# Patient Record
Sex: Female | Born: 1965 | Race: Black or African American | Hispanic: No | Marital: Married | Smoking: Current every day smoker
Health system: Southern US, Community
[De-identification: ages and names within clinical notes are randomized; demographics above are authoritative.]

## PROBLEM LIST (undated history)

## (undated) DIAGNOSIS — D649 Anemia, unspecified: Secondary | ICD-10-CM

## (undated) DIAGNOSIS — I10 Essential (primary) hypertension: Secondary | ICD-10-CM

## (undated) DIAGNOSIS — T7840XA Allergy, unspecified, initial encounter: Secondary | ICD-10-CM

## (undated) DIAGNOSIS — R7303 Prediabetes: Secondary | ICD-10-CM

## (undated) DIAGNOSIS — R011 Cardiac murmur, unspecified: Secondary | ICD-10-CM

## (undated) DIAGNOSIS — F319 Bipolar disorder, unspecified: Secondary | ICD-10-CM

## (undated) DIAGNOSIS — K219 Gastro-esophageal reflux disease without esophagitis: Secondary | ICD-10-CM

## (undated) HISTORY — PX: TUBAL LIGATION: SHX77

## (undated) HISTORY — PX: HERNIA REPAIR: SHX51

## (undated) HISTORY — DX: Essential (primary) hypertension: I10

## (undated) HISTORY — DX: Bipolar disorder, unspecified: F31.9

## (undated) HISTORY — DX: Gastro-esophageal reflux disease without esophagitis: K21.9

## (undated) HISTORY — DX: Prediabetes: R73.03

## (undated) HISTORY — DX: Allergy, unspecified, initial encounter: T78.40XA

---
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
## 1988-12-23 HISTORY — PX: TUBAL LIGATION: SHX77

## 1998-02-20 ENCOUNTER — Inpatient Hospital Stay (HOSPITAL_COMMUNITY): Admission: EM | Admit: 1998-02-20 | Discharge: 1998-02-22 | Payer: Self-pay | Admitting: Obstetrics & Gynecology

## 1998-03-16 ENCOUNTER — Other Ambulatory Visit: Admission: RE | Admit: 1998-03-16 | Discharge: 1998-03-16 | Payer: Self-pay | Admitting: Obstetrics

## 1998-04-05 ENCOUNTER — Ambulatory Visit (HOSPITAL_COMMUNITY): Admission: RE | Admit: 1998-04-05 | Discharge: 1998-04-05 | Payer: Self-pay | Admitting: Dermatology

## 1998-04-17 ENCOUNTER — Ambulatory Visit (HOSPITAL_COMMUNITY): Admission: RE | Admit: 1998-04-17 | Discharge: 1998-04-17 | Payer: Self-pay | Admitting: Dermatology

## 1998-06-10 ENCOUNTER — Emergency Department (HOSPITAL_COMMUNITY): Admission: EM | Admit: 1998-06-10 | Discharge: 1998-06-10 | Payer: Self-pay | Admitting: Emergency Medicine

## 2000-02-15 ENCOUNTER — Encounter: Admission: RE | Admit: 2000-02-15 | Discharge: 2000-02-15 | Payer: Self-pay | Admitting: Internal Medicine

## 2001-01-15 ENCOUNTER — Other Ambulatory Visit: Admission: RE | Admit: 2001-01-15 | Discharge: 2001-01-15 | Payer: Self-pay | Admitting: Obstetrics

## 2003-10-11 ENCOUNTER — Encounter: Payer: Self-pay | Admitting: Emergency Medicine

## 2003-10-11 ENCOUNTER — Emergency Department (HOSPITAL_COMMUNITY): Admission: EM | Admit: 2003-10-11 | Discharge: 2003-10-11 | Payer: Self-pay | Admitting: Emergency Medicine

## 2003-10-14 ENCOUNTER — Emergency Department (HOSPITAL_COMMUNITY): Admission: EM | Admit: 2003-10-14 | Discharge: 2003-10-14 | Payer: Self-pay | Admitting: Emergency Medicine

## 2003-10-17 ENCOUNTER — Encounter: Payer: Self-pay | Admitting: Emergency Medicine

## 2003-10-17 ENCOUNTER — Emergency Department (HOSPITAL_COMMUNITY): Admission: EM | Admit: 2003-10-17 | Discharge: 2003-10-17 | Payer: Self-pay | Admitting: Emergency Medicine

## 2003-10-19 ENCOUNTER — Emergency Department (HOSPITAL_COMMUNITY): Admission: EM | Admit: 2003-10-19 | Discharge: 2003-10-19 | Payer: Self-pay | Admitting: Emergency Medicine

## 2004-06-30 ENCOUNTER — Emergency Department (HOSPITAL_COMMUNITY): Admission: EM | Admit: 2004-06-30 | Discharge: 2004-06-30 | Payer: Self-pay | Admitting: Emergency Medicine

## 2004-07-02 ENCOUNTER — Emergency Department (HOSPITAL_COMMUNITY): Admission: EM | Admit: 2004-07-02 | Discharge: 2004-07-02 | Payer: Self-pay | Admitting: Emergency Medicine

## 2004-09-26 ENCOUNTER — Emergency Department (HOSPITAL_COMMUNITY): Admission: EM | Admit: 2004-09-26 | Discharge: 2004-09-26 | Payer: Self-pay | Admitting: Emergency Medicine

## 2004-10-21 ENCOUNTER — Emergency Department (HOSPITAL_COMMUNITY): Admission: EM | Admit: 2004-10-21 | Discharge: 2004-10-21 | Payer: Self-pay | Admitting: Emergency Medicine

## 2004-10-31 ENCOUNTER — Emergency Department (HOSPITAL_COMMUNITY): Admission: EM | Admit: 2004-10-31 | Discharge: 2004-10-31 | Payer: Self-pay | Admitting: Emergency Medicine

## 2005-02-01 ENCOUNTER — Emergency Department (HOSPITAL_COMMUNITY): Admission: EM | Admit: 2005-02-01 | Discharge: 2005-02-01 | Payer: Self-pay | Admitting: Emergency Medicine

## 2005-03-23 ENCOUNTER — Emergency Department (HOSPITAL_COMMUNITY): Admission: EM | Admit: 2005-03-23 | Discharge: 2005-03-23 | Payer: Self-pay | Admitting: Emergency Medicine

## 2005-05-09 ENCOUNTER — Emergency Department (HOSPITAL_COMMUNITY): Admission: EM | Admit: 2005-05-09 | Discharge: 2005-05-09 | Payer: Self-pay | Admitting: Emergency Medicine

## 2005-06-23 ENCOUNTER — Emergency Department (HOSPITAL_COMMUNITY): Admission: EM | Admit: 2005-06-23 | Discharge: 2005-06-23 | Payer: Self-pay | Admitting: Emergency Medicine

## 2005-08-21 ENCOUNTER — Emergency Department (HOSPITAL_COMMUNITY): Admission: EM | Admit: 2005-08-21 | Discharge: 2005-08-21 | Payer: Self-pay | Admitting: Emergency Medicine

## 2005-11-02 ENCOUNTER — Emergency Department (HOSPITAL_COMMUNITY): Admission: EM | Admit: 2005-11-02 | Discharge: 2005-11-02 | Payer: Self-pay | Admitting: Emergency Medicine

## 2006-11-21 ENCOUNTER — Inpatient Hospital Stay (HOSPITAL_COMMUNITY): Admission: EM | Admit: 2006-11-21 | Discharge: 2006-11-23 | Payer: Self-pay | Admitting: Emergency Medicine

## 2007-07-02 ENCOUNTER — Ambulatory Visit: Payer: Self-pay | Admitting: Internal Medicine

## 2007-08-17 ENCOUNTER — Encounter: Payer: Self-pay | Admitting: Nurse Practitioner

## 2007-08-17 DIAGNOSIS — M79609 Pain in unspecified limb: Secondary | ICD-10-CM | POA: Insufficient documentation

## 2007-08-17 DIAGNOSIS — F329 Major depressive disorder, single episode, unspecified: Secondary | ICD-10-CM

## 2007-08-19 ENCOUNTER — Encounter (INDEPENDENT_AMBULATORY_CARE_PROVIDER_SITE_OTHER): Payer: Self-pay | Admitting: Nurse Practitioner

## 2007-08-19 ENCOUNTER — Ambulatory Visit: Payer: Self-pay | Admitting: Family Medicine

## 2007-08-19 ENCOUNTER — Other Ambulatory Visit: Admission: RE | Admit: 2007-08-19 | Discharge: 2007-08-19 | Payer: Self-pay | Admitting: Internal Medicine

## 2007-08-19 LAB — CONVERTED CEMR LAB
Chlamydia, DNA Probe: NEGATIVE
GC Probe Amp, Genital: NEGATIVE

## 2007-08-26 ENCOUNTER — Ambulatory Visit (HOSPITAL_COMMUNITY): Admission: RE | Admit: 2007-08-26 | Discharge: 2007-08-26 | Payer: Self-pay | Admitting: Internal Medicine

## 2009-08-23 ENCOUNTER — Encounter (INDEPENDENT_AMBULATORY_CARE_PROVIDER_SITE_OTHER): Payer: Self-pay | Admitting: Nurse Practitioner

## 2011-05-06 ENCOUNTER — Emergency Department (HOSPITAL_COMMUNITY): Payer: No Typology Code available for payment source

## 2011-05-06 ENCOUNTER — Emergency Department (HOSPITAL_COMMUNITY)
Admission: EM | Admit: 2011-05-06 | Discharge: 2011-05-06 | Disposition: A | Discharge status: Home / Self Care | Payer: No Typology Code available for payment source | Attending: Emergency Medicine | Admitting: Emergency Medicine

## 2011-05-06 DIAGNOSIS — M25569 Pain in unspecified knee: Secondary | ICD-10-CM | POA: Insufficient documentation

## 2011-05-06 DIAGNOSIS — T1490XA Injury, unspecified, initial encounter: Secondary | ICD-10-CM | POA: Insufficient documentation

## 2011-05-06 DIAGNOSIS — M542 Cervicalgia: Secondary | ICD-10-CM | POA: Insufficient documentation

## 2011-05-06 DIAGNOSIS — M545 Low back pain, unspecified: Secondary | ICD-10-CM | POA: Insufficient documentation

## 2011-05-06 DIAGNOSIS — Y92410 Unspecified street and highway as the place of occurrence of the external cause: Secondary | ICD-10-CM | POA: Insufficient documentation

## 2011-05-10 NOTE — Consult Note (Signed)
Cheyenne Fowler, Cheyenne Fowler               ACCOUNT NO.:  [id number]   MEDICAL RECORD NO.:  [id number]          PATIENT TYPE:  EMS   LOCATION:  ED                           FACILITY:  [facility]   PHYSICIAN:  Ardeth Sportsman, MD     DATE OF BIRTH:  October 04, 1966   DATE OF CONSULTATION:  11/21/2006  DATE OF DISCHARGE:                                 CONSULTATION   REASON FOR CONSULTATION:  Left buttock abscess.   HISTORY OF PRESENT ILLNESS:  Cheyenne Fowler is a 45 year old female  otherwise pretty healthy who has had about a three-day history of  increasing pain and swelling and tenderness in her left buttock.  She  denies any trauma. No history of any perirectal abscess. She has never  had anything like this before.  No history of skin infection or diabetes  in the past.  Pain is intensifying and worsening.  A concerned friend  brought her into the emergency room.   PAST MEDICAL HISTORY:  Otherwise negative.   PAST SURGICAL HISTORY:  She denies.   ALLERGIES:  PENICILLIN gives her a rash and SULFA gives her a rash.   MEDICATIONS:  None.   SOCIAL HISTORY:  She has about a 20-pack-year history of tobacco. Denies  any alcohol or other drug use.   FAMILY HISTORY:  Negative for any diabetes or cardiopulmonary disorders.   REVIEW OF SYSTEMS:  Comprehensive review of systems as noted and  constitutional, ophthalmologic, ENT, cardiac, respiratory, GI, GU, GYN  are otherwise negative. Musculoskeletal and neurological,  lymphadenopathy and hematology are otherwise negative.  No blood  thinners, no history of major bleeding or nosebleeds or menorrhagia.  Dermatology: No prior skin infections that she can recall.  She does  have some old chronic rash skin changes and she says that she bruises  easily.  Psychiatric history negative.   PHYSICAL EXAMINATION:  VITAL SIGNS:  Temperature 101.5, pulse 114, blood  pressure 114/73, respirations 16.  She has 10/10 pain.  GENERAL:  She is well-developed, overweight  female, obviously  uncomfortable but not toxic.  PSYCHIATRIC:  She was a little tired but she is at least average  intelligence and no evidence of dementia, psychosis or paranoia.  HEENT:  Pupils are equal, round and reactive to light.  Extraocular  movements intact.  Sclerae nonicteric or injected.  She is  normocephalic.  No facial asymmetry.  Mucous membranes somewhat dry but  nasopharynx and oropharynx clear.  NECK:  Supple without any masses.  Trachea midline.  I feel no obvious  thyroid masses.  HEART:  Regular rate and rhythm.  No murmurs, clicks or rubs.  No  carotid bruits.  She has normal radial and dorsalis pedis pulses.  CHEST:  No pain over the sternum on compression.  Lungs clear to  auscultation bilaterally.  No wheezes, rales or rhonchi.  ABDOMEN:  Soft, nontender, nondistended.  GENITOURINARY:  Normal female genitalia.  RECTAL:  She has normal sphincter tone.  No anorectal masses.  No  obvious anorectal fistulae.  LYMPHADENOPATHY:  No head and neck, axillary, or groin lymphadenopathy.  MUSCULOSKELETAL:  Full  range of motion shoulders, elbows, wrists, hips,  knees, and ankles except for on the left.  The hip is somewhat decreased  secondary to pain.  SKIN:  On the left buttock she has a 15 x 20 cm area of induration, pain  and swelling.  This is very exquisitely tender with some deep  fluctuance.  It seemed to be far away from the anus in itself and it was  actually kind of posterior, almost posterolateral. CBC is pending.   ASSESSMENT/PLAN:  A 45 year old female with a large gluteal abscess.   1. Options were discussed but given the large size of this and the      exquisite tenderness, I think this would be better dealt with in      the operating room where she could have monitored deep sedation or      even general anesthesia and given a proper incision and drainage of      the area.  2. Intravenous antibiotics.  Will start with some IV cephazolin      although I  worry she is at high risk for MRSA.  Will follow the      patient on culture and probably place her on some vancomycin as      well.  3. Hospital admission and IV antibiotics until induration has gone      down and transition over appropriate oral antibiotic.   Options were discussed and technique of surgery was explained, questions  answered and she and her friend agree to proceed.      Ardeth Sportsman, MD  Electronically Signed     SCG/MEDQ  D:  11/21/2006  T:  11/21/2006  Job:  [id number]

## 2011-05-10 NOTE — Op Note (Signed)
[name], Cheyenne Fowler               ACCOUNT NO.:  [id number]   MEDICAL RECORD NO.:  [id number]          PATIENT TYPE:  EMS   LOCATION:  ED                           FACILITY:  [facility]   PHYSICIAN:  Ardeth Sportsman, MD     DATE OF BIRTH:  Sep 14, 1966   DATE OF PROCEDURE:  DATE OF DISCHARGE:                               OPERATIVE REPORT   REQUESTING PHYSICIAN:  Dr. Babs Bertin.   PREOPERATIVE DIAGNOSIS:  Left buttock abscess.   POSTOPERATIVE DIAGNOSIS:  Left buttock abscess.   PROCEDURE PERFORMED:  Incision and drainage of left buttock abscess with  cultures.   EXAMINATION UNDER ANESTHESIA:  1. General.  2. Bupivacaine 0.25% 30 ml using a field block around the area of      induration.   SPECIMENS:  Cultures for aerobic and anaerobic.   DRAINS:  None.   ESTIMATED BLOOD LOSS:  Less than 10 ml.   COMPLICATIONS:  None apparent.   INDICATIONS:  [name] is a 45 year old female with a 72-hour history  of pain and swelling of her buttock and was found to have a large area  of induration and pain around 15 x 20 cm, felt too large to be safely  drained in the emergency room.  Options were discussed, and informed  consent was given for incision and drainage under general anesthesia.  The risks of stroke, MI, DVT, pulmonary embolism, or death were  discussed.  Other risks such as need for transfusion, abscess,  recurrence, need for operation, and other risks were discussed.  Questions answered.  She agreed to proceed.   OPERATIVE FINDINGS:  She had frank pus in a large subcutaneous pocket  between the fat and her gluteus fascia.  The fascia appeared to be nice,  viable, and intact with no evidence of any fasciitis.   DESCRIPTION OF PROCEDURE:  Informed consent was confirmed.  She received  2 gm of cefazolin and tolerated this well, despite her history of  PENICILLIN allergy in the past.  She underwent general anesthesia  without difficulty.  She was positioned prone in a jack-knife  position.  Her glutei were prepped and draped in a sterile fashion.  A 2 cm  incision was made into the gluteus and bluntly entered a large cavity  with evacuation of pus.  Swabs were sent for culture.  The incision was  extended to about 5 cm in total vertical length.  Careful inspection was  able to free the fascia and break up any related pockets.  The fascia  had good, healthy bleeding tissue.  The area was suctioned out and  cleared out.  Hemostasis was maintained.  The wound was packed with some  Betadine-soaked gauze.   EXAMINATION UNDER ANESTHESIA:  Rectal exam revealed no rectal masses or  any fluctuans, arguing against this being a perirectal abscess and  purely a soft tissue gluteal abscess.   The patient was extubated and taken to the recovery room in stable  condition.      Ardeth Sportsman, MD  Electronically Signed     SCG/MEDQ  D:  11/21/2006  T:  11/21/2006  Job:  [id number]

## 2012-02-18 ENCOUNTER — Encounter (HOSPITAL_COMMUNITY): Payer: Self-pay | Admitting: *Deleted

## 2012-02-18 ENCOUNTER — Emergency Department (HOSPITAL_COMMUNITY)
Admission: EM | Admit: 2012-02-18 | Discharge: 2012-02-18 | Disposition: A | Discharge status: Home / Self Care | Payer: Self-pay | Attending: Emergency Medicine | Admitting: Emergency Medicine

## 2012-02-18 DIAGNOSIS — N63 Unspecified lump in unspecified breast: Secondary | ICD-10-CM | POA: Insufficient documentation

## 2012-02-18 DIAGNOSIS — F17200 Nicotine dependence, unspecified, uncomplicated: Secondary | ICD-10-CM | POA: Insufficient documentation

## 2012-02-18 DIAGNOSIS — N644 Mastodynia: Secondary | ICD-10-CM | POA: Insufficient documentation

## 2012-02-18 DIAGNOSIS — N390 Urinary tract infection, site not specified: Secondary | ICD-10-CM | POA: Insufficient documentation

## 2012-02-18 HISTORY — DX: Cardiac murmur, unspecified: R01.1

## 2012-02-18 LAB — URINALYSIS, ROUTINE W REFLEX MICROSCOPIC
Protein, ur: NEGATIVE mg/dL
Urobilinogen, UA: 0.2 mg/dL (ref 0.0–1.0)

## 2012-02-18 LAB — PREGNANCY, URINE: Preg Test, Ur: NEGATIVE

## 2012-02-18 MED ORDER — CIPROFLOXACIN HCL 500 MG PO TABS: 500.0000 mg | ORAL_TABLET | Freq: Two times a day (BID) | ORAL | Status: DC

## 2012-02-18 MED ORDER — IBUPROFEN 600 MG PO TABS: 600.0000 mg | ORAL_TABLET | Freq: Three times a day (TID) | ORAL | Status: AC

## 2012-02-18 MED ORDER — TRAMADOL HCL 50 MG PO TABS
50.0000 mg | ORAL_TABLET | Freq: Once | ORAL | Status: AC
  Administered 2012-02-18: 50 mg via ORAL
  Filled 2012-02-18: qty 1

## 2012-02-18 MED ORDER — IBUPROFEN 600 MG PO TABS: 600.0000 mg | ORAL_TABLET | Freq: Three times a day (TID) | ORAL | Status: DC

## 2012-02-18 MED ORDER — TRAMADOL HCL 50 MG PO TABS: 50.0000 mg | ORAL_TABLET | Freq: Three times a day (TID) | ORAL | Status: AC | PRN

## 2012-02-18 MED ORDER — TRAMADOL HCL 50 MG PO TABS: 50.0000 mg | ORAL_TABLET | Freq: Three times a day (TID) | ORAL | Status: DC | PRN

## 2012-02-18 MED ORDER — CIPROFLOXACIN HCL 500 MG PO TABS: 500.0000 mg | ORAL_TABLET | Freq: Two times a day (BID) | ORAL | Status: AC

## 2012-02-18 NOTE — Discharge Instructions (Signed)
Please read the information below.  Call the [facility] at [facility] hospital for a follow up appointment.  You may return to the ER at any time for worsening condition or any new symptoms that concern you.    Breast Tenderness Breast tenderness is a common complaint made by women of all ages. It is also called mastalgia or mastodynia, which means breast pain. The condition can range from mild discomfort to severe pain. It has a variety of causes. Your caregiver will find out the likely cause of your breast tenderness by examining your breasts, asking you about symptoms and perhaps ordering some tests. Breast tenderness usually does not mean you have breast cancer. CAUSES  Breast tenderness has many possible causes. They include:  Premenstrual changes. A week to 10 days before your period, your breasts might ache or feel tender.   Other hormonal causes. These include:   When sexual and physical traits mature (puberty).   Pregnancy.   The time right before and the year after menopause (perimenopause).   The day when it has been 12 months since your last period (menopause).   Large breasts.   Infection (also called mastitis).   Birth control pills.   Breastfeeding. Tenderness can occur if the breasts are overfull with milk or if a milk duct is blocked.   Injury.   Fibrocystic breast changes. This is not cancer (benign). It causes painful breasts that feel lumpy.   Fluid-filled sacs (cysts). Often cysts can be drained in your healthcare provider's office.   Fibroadenoma. This is a tumor that is not cancerous.   Medication side effects. Blood pressure drugs and diuretics (which increase urine flow) sometimes cause breast tenderness.   Previous breast surgery, such as a breast reduction.   Breast cancer. Cancer is rarely the reason breasts are tender. In most women, tenderness is caused by something else.  DIAGNOSIS  Several methods can be used to find out why your breasts are  tender. They include:  Visual inspection of the breasts.   Examination by hand.   Tests, such as:   Mammogram.   Ultrasound.   Biopsy.   Lab test of any fluid coming from the nipple.   Blood tests.   MRI.  TREATMENT  Treatment is directed to the cause of the breast tenderness from doing nothing for minor discomfort, wearing a good support bra but also may include:  Taking over-the-counter medicines for pain or discomfort as directed by your caregiver.   Prescription medicine for breast tenderness related to:   Premenstrual.   Fibrocystic.   Puberty.   Pregnancy.   Menopause.   Previous breast surgery.   Large breasts.   Antibiotics for infection.   Birth control pills for fibrocystic and premenstrual changes.   More frequent feedings or pumping of the breasts and warm compresses for breast engorgement when nursing.   Cold and warm compresses and a good support bra for most breast injuries.   Breast cysts are sometimes drained with a needle (aspiration) or removed with minor surgery.   Fibroadenomas are usually removed with minor surgery.   Changing or stopping the medicine when it is responsible for causing the breast tenderness.   When breast cancer is present with or without causing pain, it is usually treated with major surgery (with or without radiation) and chemotherapy.  HOME CARE INSTRUCTIONS  Breast tenderness often can be handled at home. You can try:  Getting fitted for a new bra that provides more support, especially during  exercise.   Wearing a more supportive or sports bra while sleeping when your breasts are very tender.   If you have a breast injury, using an ice pack for 15 to 20 minutes. Wrap the pack in a towel. Do not put the ice pack directly on your breast.   If your breasts are too full of milk as a result of breastfeeding, try:   Expressing milk either by hand or with a breast pump.   Applying a warm compress for relief.    Taking over-the-counter pain relievers, if this is OK with your caregiver.   Taking medicine that your caregiver prescribes. These might include antibiotics or birth control pills.  Over the long term, your breast tenderness might be eased if you:  Cut down on caffeine.   Reduce the amount of fat in your diet.  Also, learn how to do breast examinations at home. This will help you tell when you have an unusual growth or lump that could cause tenderness. And keep a log of the days and times when your breasts are most tender. This will help you and your caregiver find the right solution. SEEK MEDICAL CARE IF:   Any part of your breast is hard, red and hot to the touch. This could be a sign of infection.   Fluid is coming out of your nipples (and you are not breastfeeding). Especially watch for blood or pus.   You have a fever as well as breast tenderness.   You have a new or painful lump in your breast that remains after your period ends.   You have tried to take care of the pain at home, but it has not gone away.   Your breast pain is getting worse. Or, the pain is making it hard to do the things you usually do during your day.  Document Released: 11/21/2008 Document Revised: 08/21/2011 Document Reviewed: 11/21/2008 [facility] Patient Information 2012 [place], [place].Mammography Information A mammography is a procedure that uses x-rays to make pictures of breast tissue. Mammography is suggested to screen for breast cancer in women over 51 years of age. It will help find the cause of breast lumps. If you are over 50, or have a family history of breast cancer, you are at increased risk of developing breast cancer. Arrive at the hospital 60 minutes early to check in or as directed. BEFORE THE PROCEDURE  Schedule your test about 7 days after your menstrual period. This is when your breasts are the least tender and have signs of hormone changes.   Note that your breasts will have major  changes during pregnancy, breastfeeding and menopause.   If you have had this procedure somewhere else, get the mammogram x-rays or have them sent to this exam facility in order to compare them.  LET YOUR CAREGIVERS KNOW ABOUT:  Breast implants.   Previous breast disease or surgery.   Any moles, growths, or scars on your breasts.   Breastfeeding.   Allergies.   Medications taken including herbs, eye drops, over-the-counter medications and creams.   Hormones.   Use of steroids (by mouth or creams).   Previous problems with anesthetics or Novocaine.   Possibility of pregnancy, if this applies.   History of blood clots (thrombophlebitis).   History of bleeding or blood problems.   Other health problems.   Use of birth control pills.  PROCEDURE  You will need to undress from the waist up and put on a gown.   The mammogram is  done by an x-ray technician.   You stand in front of the x-ray machine.   Two smooth plastic or glass plates are placed around your breast in different positions with slight pressure and x-rays are taken from different angles of the breast. The same thing is done on the other breast.   Your breasts will be positioned and compressed with the plates by the technologist. This produces the most complete and useful x-ray images.  Relax as much as possible during the test. Any discomfort during the mammogram will be very brief. AFTER THE PROCEDURE  You may resume normal activities.   The mammogram is read by a x-ray specialist (radiologist).   Make an appointment with your caregiver to find out the results. Do not assume everything is normal if you have not heard from your caregiver or the medical facility. It is important for you to follow-up on all of your test results.   Talk to your caregiver if you do not understand the results of your mammogram and any further treatment or tests that may need to be done, such as:   Magnetic Resonance Imaging  (MRI), this has no x-rays. A MRI is used to examine internal organs and parts of the body by using a strong magnetic field and radio waves. It is useful when there are thick and heavy breasts or findings on a mammogram that cannot be diagnosed. It is safe to use on pregnant women.   Ultrasound uses sound waves to examine internal organs and is helpful to determine if a breast lump is a cyst filled with fluid or a solid tumor. It is safe to use on pregnant women.   Draining a cyst in the breast.   Taking a tissue sample (biopsy) of a lump in the breast.   Any kind of surgery on the breast.  HOME CARE INSTRUCTIONS   Wash your breasts and under your arms the day of the test.   Do not wear deodorants, perfumes or powders anywhere on your body.   Wear clothes that you can change in and out of easily.   Arrive at the facility on time to check in and get prepared for your mammogram.  SEEK MEDICAL CARE IF:   There are any changes you find on self-examination that cannot be explained by your caregiver.   There is any bleeding or discharge from the nipple.   You find a lump or thickening of the breast or armpit.   You see the nipple going inward.   You develop redness, puckering or dimples in the skin of the breast.  RECOMMENDED SCREENING GUIDELINES FOR MAMMOGRAPHY  By age 87 you should have had your first mammogram.   At age 34-49 years, repeat every 1 to 2 years or as directed by your caregiver.   Mammograms should be done yearly after 47 years of age.   Regular physical exams and self exams are also very important to detecting early problems and should continue.   Mammography should start at an earlier age if there is a strong family history of breast cancer, or if there are other problems that make a mammogram necessary.  Document Released: 12/06/2000 Document Revised: 08/21/2011 Document Reviewed: 02/05/2008 [facility] Patient Information 2012 [place], [place].Urinary Tract  Infection Infections of the urinary tract can start in several places. A bladder infection (cystitis), a kidney infection (pyelonephritis), and a prostate infection (prostatitis) are different types of urinary tract infections (UTIs). They usually get better if treated with medicines (antibiotics)  that kill germs. Take all the medicine until it is gone. You or your child may feel better in a few days, but TAKE ALL MEDICINE or the infection may not respond and may become more difficult to treat. HOME CARE INSTRUCTIONS   Drink enough water and fluids to keep the urine clear or pale yellow. Cranberry juice is especially recommended, in addition to large amounts of water.   Avoid caffeine, tea, and carbonated beverages. They tend to irritate the bladder.   Alcohol may irritate the prostate.   Only take over-the-counter or prescription medicines for pain, discomfort, or fever as directed by your caregiver.  To prevent further infections:  Empty the bladder often. Avoid holding urine for long periods of time.   After a bowel movement, women should cleanse from front to back. Use each tissue only once.   Empty the bladder before and after sexual intercourse.  FINDING OUT THE RESULTS OF YOUR TEST Not all test results are available during your visit. If your or your child's test results are not back during the visit, make an appointment with your caregiver to find out the results. Do not assume everything is normal if you have not heard from your caregiver or the medical facility. It is important for you to follow up on all test results. SEEK MEDICAL CARE IF:   There is back pain.   Your baby is older than 3 months with a rectal temperature of 100.5 F (38.1 C) or higher for more than 1 day.   Your or your child's problems (symptoms) are no better in 3 days. Return sooner if you or your child is getting worse.  SEEK IMMEDIATE MEDICAL CARE IF:   There is severe back pain or lower abdominal pain.    You or your child develops chills.   You have a fever.   Your baby is older than 3 months with a rectal temperature of 102 F (38.9 C) or higher.   Your baby is 29 months old or younger with a rectal temperature of 100.4 F (38 C) or higher.   There is nausea or vomiting.   There is continued burning or discomfort with urination.  MAKE SURE YOU:   Understand these instructions.   Will watch your condition.   Will get help right away if you are not doing well or get worse.  Document Released: 09/18/2005 Document Revised: 08/21/2011 Document Reviewed: 04/23/2007 [facility] Patient Information 2012 [place], [place].  RESOURCE GUIDE  Dental Problems  Patients with Medicaid: [facility] [phone]                                           [phone] [address] [employer] [address] Phone:  [phone]                                                  Phone:  [phone]  If unable to pay or uninsured, contact:  [facility] or [facility]. to become qualified for the adult dental clinic.  Chronic Pain Problems Contact [facility]  [phone] Patients need to  be referred by their primary care doctor.  Insufficient Money for Medicine Contact [employer]:  call "211" or [facility] (336) 070-2776.  No Primary Care Doctor Call [facility]  [phone] Other agencies that provide inexpensive medical care    [facility]  [phone]    [facility]  [phone]    [facility]  [phone]    [facility]  [phone]    [facility]  [phone]    [facility]  [phone]  Psychological Services [facility]  [phone] [facility] Services  [phone] [facility]   [phone] (emergency services 228-044-8233)  Substance Abuse Resources [facility]  507-514-5541 [facility] [phone] [facility]  [phone] Floyd[facility] Flock [phone] Residential & Outpatient Substance Abuse Program  [phone]  Abuse/Neglect [facility] Child Abuse Hotline (910) 246-0588 [facility] Child Abuse Hotline 937-604-2954 (After Hours)  Emergency Shelter [facility] ([phone]  Maternity Homes [facility] at [facility] [place] [facility] [phone] [facility] [phone]  MRSA Hotline #:   (450) 280-5338    [facility] Resources  [facility] [place] [place]     [employer]                          [facility] Dept. [address] [place]                       [address]      [address]  Blondell Reveal Phone:  [phone]                                   Phone:  [phone]                 Phone:  [phone]  [facility] Phone:  [phone]  [facility] Child Abuse Hotline 807-331-0969 [phone] (After Hours)

## 2012-02-18 NOTE — ED Notes (Signed)
Pt states "my breast have been sore for about 2 months, but swollen for 2 wks, I thought when I came on it would go down, go away, last mammogram was x 7 yrs ago"

## 2012-02-18 NOTE — ED Provider Notes (Signed)
History     CSN: [id number]  Arrival date & time 02/18/12  1210   First MD Initiated Contact with Patient 02/18/12 1510      Chief Complaint  Patient presents with  . Breast Pain    (Consider location/radiation/quality/duration/timing/severity/associated sxs/prior treatment) HPI Comments: Patient reports she has had soreness of her bilateral breasts intermittently for several months, then constantly for the past two months. The pain is described as soreness and heaviness, which is similar to the pain she gets right before her period.  States she is having normal, regular periods, that are perhaps one day longer than normal but otherwise unchanged.  Denies fevers, unexpected weight loss or gain, chest pain, SOB.  Denies nipple discharge or skin changes.  She does not have a current obgyn.  No family or personal hx of breast, uterine, ovarian, or other female cancers.    The history is provided by the patient.    Past Medical History  Diagnosis Date  . Heart murmur     Past Surgical History  Procedure Date  . Hernia repair     Family History  Problem Relation Age of Onset  . Diabetes Mother     History  Substance Use Topics  . Smoking status: Current Everyday Smoker -- 0.5 packs/day  . Smokeless tobacco: Not on file  . Alcohol Use: Yes     120 oz per wk    OB History    Grav Para Term Preterm Abortions TAB SAB Ect Mult Living                  Review of Systems  Constitutional: Negative for fatigue and unexpected weight change.  Respiratory: Negative for chest tightness and shortness of breath.   Cardiovascular: Negative for chest pain.  Gastrointestinal: Negative for nausea, vomiting, abdominal pain, diarrhea and constipation.  Genitourinary: Negative for dysuria, urgency, frequency, vaginal discharge and vaginal pain.  All other systems reviewed and are negative.    Allergies  Penicillins and Sulfonamide derivatives  Home Medications   Current Outpatient  Rx  Name Route Sig Dispense Refill  . DIPHENHYDRAMINE HCL (SLEEP) 25 MG PO TABS Oral Take 25 mg by mouth at bedtime as needed. For allergies    . IBUPROFEN 200 MG PO TABS Oral Take 600 mg by mouth every 6 (six) hours as needed. For pain    . VITAMIN C 500 MG PO TABS Oral Take 1,000 mg by mouth daily.    Marland Kitchen VITAMIN E 100 UNITS PO CAPS Oral Take 100 Units by mouth daily.      BP 145/80  Pulse 75  Temp(Src) 98.6 F (37 C) (Oral)  Resp 18  Wt 166 lb 9.6 oz (75.569 kg)  SpO2 100%  LMP 02/08/2012  Physical Exam  Nursing note and vitals reviewed. Constitutional: She is oriented to person, place, and time. She appears well-developed and well-nourished.  HENT:  Head: Normocephalic and atraumatic.  Neck: Neck supple.  Cardiovascular: Normal rate, regular rhythm and normal heart sounds.   Pulmonary/Chest: Effort normal and breath sounds normal. No respiratory distress. She has no wheezes. She has no rales. She exhibits mass and tenderness.         Bilateral breasts tender to palpation diffusely.  No axillary lymphadenopathy.  No skin changes, no nipple discharge.    Abdominal: Soft. Bowel sounds are normal. There is no tenderness.  Neurological: She is alert and oriented to person, place, and time.    ED Course  Procedures (including critical  care time)  Labs Reviewed  URINALYSIS, ROUTINE W REFLEX MICROSCOPIC - Abnormal; Notable for the following:    APPearance CLOUDY (*)    Leukocytes, UA LARGE (*)    All other components within normal limits  URINE MICROSCOPIC-ADD ON - Abnormal; Notable for the following:    Bacteria, UA MANY (*)    All other components within normal limits  PREGNANCY, URINE   No results found.   1. Breast pain in female   2. UTI (lower urinary tract infection)       MDM  Patient with diffuse bilateral breast pain x >2 months.  Patient with one lesion of right breast and bilateral diffuse tenderness.  No constitutional symptoms, no concern for infection.   Patient given gyn follow up.  Patient verbalizes understanding and agrees with plan.            Dillard Cannon Amber, [place] 02/18/12 [phone]

## 2012-02-19 NOTE — ED Provider Notes (Signed)
Medical screening examination/treatment/procedure(s) were performed by non-physician practitioner and as supervising physician I was immediately available for consultation/collaboration.  Shelda Jakes, MD 02/19/12 [phone]

## 2012-03-18 ENCOUNTER — Encounter: Payer: Self-pay | Admitting: Obstetrics and Gynecology

## 2012-03-18 ENCOUNTER — Ambulatory Visit (INDEPENDENT_AMBULATORY_CARE_PROVIDER_SITE_OTHER): Payer: Self-pay | Admitting: Obstetrics and Gynecology

## 2012-03-18 VITALS — BP 159/108 | HR 87 | Temp 97.4°F | Ht 63.5 in | Wt 167.2 lb

## 2012-03-18 DIAGNOSIS — Z789 Other specified health status: Secondary | ICD-10-CM

## 2012-03-18 DIAGNOSIS — Z0000 Encounter for general adult medical examination without abnormal findings: Secondary | ICD-10-CM | POA: Insufficient documentation

## 2012-03-18 DIAGNOSIS — Z1231 Encounter for screening mammogram for malignant neoplasm of breast: Secondary | ICD-10-CM

## 2012-03-18 DIAGNOSIS — N644 Mastodynia: Secondary | ICD-10-CM

## 2012-03-18 NOTE — Progress Notes (Signed)
  Subjective:    Patient ID: Cheyenne Fowler, female    DOB: November 30, 1966, 46 y.o.   MRN: [id number]  HPI  1. Breast tenderness:  Referred from ED for complaint of bilateral breast tenderness.  Has not noticed any other irregularities of the breast.  Tenderness has improved from when it initially started in January.  She thinks she is starting to become menopausal as she is starting to have some episodes of hot flashes and mood fluctuation.  She also had an irregular period last month that occurred one week after her typical menstrual period and lasted x6 days.  She has not had her menstrual period this month yet.  She denies nipple discharge or swelling.  Last mammogram was in 2008, read as normal.  No family history of breast cancer that she is aware of.  2. Health maintenance:  Unsure when last pap smear was.  Denies history of irregular pap smear.  She currently does not have a PCP.  Denies any vaginal discharge, weight loss.   Review of Systems     Objective:   Physical Exam  Constitutional: She is oriented to person, place, and time. She appears well-developed and well-nourished. No distress.  Abdominal: Soft. Bowel sounds are normal. She exhibits no distension. There is no tenderness.  Genitourinary: Vagina normal and uterus normal. There is breast tenderness. No breast swelling or discharge. Cervix exhibits no motion tenderness, no discharge and no friability. Right adnexum displays no mass, no tenderness and no fullness. Left adnexum displays no mass, no tenderness and no fullness.       Diffuse Breast tenderness bilaterally.  Band of thickened tissue at 7 o'clock in right breast. No discrete mass or lump  Pap done  Neurological: She is alert and oriented to person, place, and time.          Assessment & Plan:  Agree with resident 's assessent and plan

## 2012-03-18 NOTE — Assessment & Plan Note (Signed)
Diffuse tenderness.. Area of fibrotic feeling tissue in LRQ of R breast.  No discrete lumps.  Will send for screening mammography.  Breast tenderness may be related to peri-menopausal symptoms.  Negative UPT noted from 2/26

## 2012-03-18 NOTE — Assessment & Plan Note (Signed)
Pap smear is not up to date, done today.  Also does not have PCP.  Given address and phone number to [facility] on [facility].  Not insured, may qualify for orange card.

## 2012-03-18 NOTE — Patient Instructions (Addendum)
Thank you for coming in today, it was nice to meet you I would like for you to have a mammogram done We will let you know the results of your pap when it returns The address to the [facility] is  [address] [place], [place].  Phone [phone].  We are located in front of [facility]

## 2012-04-14 ENCOUNTER — Ambulatory Visit (HOSPITAL_COMMUNITY)
Admission: RE | Admit: 2012-04-14 | Discharge: 2012-04-14 | Disposition: A | Discharge status: Home / Self Care | Payer: Self-pay | Source: Ambulatory Visit | Attending: Obstetrics and Gynecology | Admitting: Obstetrics and Gynecology

## 2012-04-14 DIAGNOSIS — Z1231 Encounter for screening mammogram for malignant neoplasm of breast: Secondary | ICD-10-CM

## 2012-04-16 ENCOUNTER — Other Ambulatory Visit: Payer: Self-pay | Admitting: Obstetrics and Gynecology

## 2012-04-16 DIAGNOSIS — R928 Other abnormal and inconclusive findings on diagnostic imaging of breast: Secondary | ICD-10-CM

## 2012-05-21 ENCOUNTER — Inpatient Hospital Stay (HOSPITAL_COMMUNITY)
Admission: AD | Admit: 2012-05-21 | Discharge: 2012-05-25 | DRG: 897 | Disposition: A | Discharge status: Home / Self Care | Payer: Federal, State, Local not specified - Other | Source: Ambulatory Visit | Attending: Psychiatry | Admitting: Psychiatry

## 2012-05-21 ENCOUNTER — Emergency Department (HOSPITAL_COMMUNITY)
Admission: EM | Admit: 2012-05-21 | Discharge: 2012-05-21 | Disposition: A | Discharge status: Other Acute Inpatient Hospital | Payer: Self-pay | Attending: Emergency Medicine | Admitting: Emergency Medicine

## 2012-05-21 ENCOUNTER — Encounter (HOSPITAL_COMMUNITY): Payer: Self-pay | Admitting: Behavioral Health

## 2012-05-21 ENCOUNTER — Encounter (HOSPITAL_COMMUNITY): Payer: Self-pay | Admitting: Emergency Medicine

## 2012-05-21 DIAGNOSIS — M79609 Pain in unspecified limb: Secondary | ICD-10-CM

## 2012-05-21 DIAGNOSIS — M542 Cervicalgia: Secondary | ICD-10-CM | POA: Diagnosis present

## 2012-05-21 DIAGNOSIS — F3289 Other specified depressive episodes: Secondary | ICD-10-CM | POA: Insufficient documentation

## 2012-05-21 DIAGNOSIS — F1920 Other psychoactive substance dependence, uncomplicated: Principal | ICD-10-CM | POA: Diagnosis present

## 2012-05-21 DIAGNOSIS — F1999 Other psychoactive substance use, unspecified with unspecified psychoactive substance-induced disorder: Secondary | ICD-10-CM | POA: Diagnosis present

## 2012-05-21 DIAGNOSIS — F17200 Nicotine dependence, unspecified, uncomplicated: Secondary | ICD-10-CM | POA: Diagnosis present

## 2012-05-21 DIAGNOSIS — F1910 Other psychoactive substance abuse, uncomplicated: Secondary | ICD-10-CM | POA: Insufficient documentation

## 2012-05-21 DIAGNOSIS — F329 Major depressive disorder, single episode, unspecified: Secondary | ICD-10-CM | POA: Insufficient documentation

## 2012-05-21 DIAGNOSIS — F32A Depression, unspecified: Secondary | ICD-10-CM

## 2012-05-21 LAB — COMPREHENSIVE METABOLIC PANEL
ALT: 24 U/L (ref 0–35)
AST: 16 U/L (ref 0–37)
Albumin: 3.5 g/dL (ref 3.5–5.2)
Alkaline Phosphatase: 83 U/L (ref 39–117)
BUN: 11 mg/dL (ref 6–23)
CO2: 26 mEq/L (ref 19–32)
Calcium: 9.3 mg/dL (ref 8.4–10.5)
Chloride: 104 mEq/L (ref 96–112)
Creatinine, Ser: 0.89 mg/dL (ref 0.50–1.10)
GFR calc Af Amer: 89 mL/min — ABNORMAL LOW (ref >90)
GFR calc non Af Amer: 77 mL/min — ABNORMAL LOW (ref >90)
Glucose, Bld: 94 mg/dL (ref 70–99)
Potassium: 3.9 mEq/L (ref 3.5–5.1)
Sodium: 138 mEq/L (ref 135–145)
Total Bilirubin: 0.2 mg/dL — ABNORMAL LOW (ref 0.3–1.2)
Total Protein: 6.7 g/dL (ref 6.0–8.3)

## 2012-05-21 LAB — RAPID URINE DRUG SCREEN, HOSP PERFORMED
Amphetamines: NOT DETECTED
Barbiturates: NOT DETECTED
Benzodiazepines: POSITIVE — AB
Cocaine: POSITIVE — AB
Opiates: NOT DETECTED
Tetrahydrocannabinol: POSITIVE — AB

## 2012-05-21 LAB — PREGNANCY, URINE: Preg Test, Ur: NEGATIVE

## 2012-05-21 LAB — CBC
HCT: 34 % — ABNORMAL LOW (ref 36.0–46.0)
Hemoglobin: 10.6 g/dL — ABNORMAL LOW (ref 12.0–15.0)
MCH: 23.9 pg — ABNORMAL LOW (ref 26.0–34.0)
MCHC: 31.2 g/dL (ref 30.0–36.0)
MCV: 76.6 fL — ABNORMAL LOW (ref 78.0–100.0)
Platelets: 346 10*3/uL (ref 150–400)
RBC: 4.44 MIL/uL (ref 3.87–5.11)
RDW: 19.2 % — ABNORMAL HIGH (ref 11.5–15.5)
WBC: 8.5 10*3/uL (ref 4.0–10.5)

## 2012-05-21 LAB — ETHANOL: Alcohol, Ethyl (B): <11 mg/dL (ref 0–11)

## 2012-05-21 MED ORDER — ACETAMINOPHEN 325 MG PO TABS: 650.0000 mg | ORAL_TABLET | ORAL | Status: DC | PRN

## 2012-05-21 MED ORDER — ALUM & MAG HYDROXIDE-SIMETH 200-200-20 MG/5ML PO SUSP: 30.0000 mL | ORAL | Status: DC | PRN

## 2012-05-21 MED ORDER — ONDANSETRON HCL 4 MG PO TABS: 4.0000 mg | ORAL_TABLET | Freq: Three times a day (TID) | ORAL | Status: DC | PRN

## 2012-05-21 MED ORDER — LORAZEPAM 1 MG PO TABS
1.0000 mg | ORAL_TABLET | Freq: Once | ORAL | Status: AC
  Administered 2012-05-21: 1 mg via ORAL
  Filled 2012-05-21: qty 1

## 2012-05-21 MED ORDER — ZOLPIDEM TARTRATE 5 MG PO TABS: 5.0000 mg | ORAL_TABLET | Freq: Every evening | ORAL | Status: DC | PRN

## 2012-05-21 MED ORDER — IBUPROFEN 600 MG PO TABS: 600.0000 mg | ORAL_TABLET | Freq: Three times a day (TID) | ORAL | Status: DC | PRN

## 2012-05-21 MED ORDER — LORAZEPAM 1 MG PO TABS: 1.0000 mg | ORAL_TABLET | Freq: Three times a day (TID) | ORAL | Status: DC | PRN

## 2012-05-21 MED ORDER — NICOTINE 21 MG/24HR TD PT24: 21.0000 mg | MEDICATED_PATCH | Freq: Every day | TRANSDERMAL | Status: DC

## 2012-05-21 NOTE — ED Notes (Signed)
Pt states she has been depressed and using alcohol, cocaine and marijuana daily and wants detox from all.  Denies SI/HI.

## 2012-05-21 NOTE — ED Provider Notes (Signed)
History    45yf with substance abuse and requesting detox. Crack cocaine, marijuana and etoh. Last used all 3 last night. No si or hi. No hallucinations. Feels depressed. Hx of previous suicide attempt over 20y ago shortly after birth of her daughter. Has not had thoughts or attempts since. Denies acute pain anywhere. Ongoing relationship issues with her long time significant other. Financial issues as well.  CSN: [id number]  Arrival date & time 05/21/12  1540   First MD Initiated Contact with Patient 05/21/12 1619      Chief Complaint  Patient presents with  . Depression  . Addiction Problem  . Detox from Cocaine     (Consider location/radiation/quality/duration/timing/severity/associated sxs/prior treatment) HPI  Past Medical History  Diagnosis Date  . Heart murmur   . Allergy     Past Surgical History  Procedure Date  . Hernia repair   . Cesarean section   . Tubal ligation     Family History  Problem Relation Age of Onset  . Diabetes Mother     History  Substance Use Topics  . Smoking status: Current Everyday Smoker -- 0.5 packs/day    Types: Cigarettes  . Smokeless tobacco: Never Used  . Alcohol Use: Yes     120 oz per wk    OB History    Grav Para Term Preterm Abortions TAB SAB Ect Mult Living   3 3 3   0     3      Review of Systems   Review of symptoms negative unless otherwise noted in HPI.   Allergies  Penicillins and Sulfonamide derivatives  Home Medications   Current Outpatient Rx  Name Route Sig Dispense Refill  . DIPHENHYDRAMINE HCL (SLEEP) 25 MG PO TABS Oral Take 25 mg by mouth at bedtime as needed. For allergies    . IBUPROFEN 200 MG PO TABS Oral Take 600 mg by mouth every 6 (six) hours as needed. For pain    . VITAMIN C 500 MG PO TABS Oral Take 1,000 mg by mouth daily.    Marland Kitchen VITAMIN E 100 UNITS PO CAPS Oral Take 100 Units by mouth daily.      BP 107/69  Pulse 78  Temp(Src) 98.5 F (36.9 C) (Oral)  Resp 16  SpO2 100%  LMP  04/25/2012  Physical Exam  Nursing note and vitals reviewed. Constitutional: She is oriented to person, place, and time. She appears well-developed and well-nourished. No distress.  HENT:  Head: Normocephalic and atraumatic.  Eyes: Conjunctivae are normal. Right eye exhibits no discharge. Left eye exhibits no discharge.  Neck: Neck supple.  Cardiovascular: Normal rate, regular rhythm and normal heart sounds.  Exam reveals no gallop and no friction rub.   No murmur heard. Pulmonary/Chest: Effort normal and breath sounds normal. No respiratory distress.  Abdominal: Soft. She exhibits no distension. There is no tenderness.  Musculoskeletal: She exhibits no edema and no tenderness.  Neurological: She is alert and oriented to person, place, and time. No cranial nerve deficit. She exhibits normal muscle tone. Coordination normal.  Skin: Skin is warm and dry.  Psychiatric: She has a normal mood and affect. Her behavior is normal. Thought content normal.       Speech clear and content appropriate. Good insight.    ED Course  Procedures (including critical care time)  Labs Reviewed  CBC - Abnormal; Notable for the following:    Hemoglobin 10.6 (*)    HCT 34.0 (*)    MCV 76.6 (*)  MCH 23.9 (*)    RDW 19.2 (*)    All other components within normal limits  COMPREHENSIVE METABOLIC PANEL - Abnormal; Notable for the following:    Total Bilirubin 0.2 (*)    GFR calc non Af Amer 77 (*)    GFR calc Af Amer 89 (*)    All other components within normal limits  URINE RAPID DRUG SCREEN (HOSP PERFORMED) - Abnormal; Notable for the following:    Cocaine POSITIVE (*)    Benzodiazepines POSITIVE (*)    Tetrahydrocannabinol POSITIVE (*)    All other components within normal limits  ETHANOL  PREGNANCY, URINE   No results found.  EKG:  Rhythm: normal sinus Rate: 72 Axis: normal Intervals: normal ST segments: normal  1. Depression   2. Polysubstance abuse       MDM  45yF with  polysubstance abuse requesting detox. No si/hi or psychosis. Voluntary and no basis to IVC. Discussed with act to try to facilitate placement.        Raeford Razor, MD 05/21/12 [phone]

## 2012-05-21 NOTE — ED Notes (Signed)
Pt presenting to ed with c/o depression, anxiety and needing detox from cocaine, marijuana and alcohol. Pt is alert and oriented at this time pt denies suicidal/homicidal ideation at this time.
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
## 2012-05-21 NOTE — ED Notes (Signed)
Patient and belongings wanded by security. One white bag of belongings and one leopard print bag of belongings placed behind triage nursing station.

## 2012-05-22 ENCOUNTER — Encounter (HOSPITAL_COMMUNITY): Payer: Self-pay | Admitting: Behavioral Health

## 2012-05-22 DIAGNOSIS — F1994 Other psychoactive substance use, unspecified with psychoactive substance-induced mood disorder: Secondary | ICD-10-CM

## 2012-05-22 DIAGNOSIS — F1910 Other psychoactive substance abuse, uncomplicated: Secondary | ICD-10-CM | POA: Diagnosis present

## 2012-05-22 DIAGNOSIS — F1010 Alcohol abuse, uncomplicated: Secondary | ICD-10-CM

## 2012-05-22 DIAGNOSIS — F1920 Other psychoactive substance dependence, uncomplicated: Secondary | ICD-10-CM | POA: Diagnosis present

## 2012-05-22 MED ORDER — CARBAMAZEPINE 200 MG PO TABS
200.0000 mg | ORAL_TABLET | Freq: Every day | ORAL | Status: DC
  Administered 2012-05-22 – 2012-05-24 (×3): 200 mg via ORAL
  Filled 2012-05-22 (×2): qty 1
  Filled 2012-05-22: qty 14
  Filled 2012-05-22 (×2): qty 1

## 2012-05-22 MED ORDER — ACETAMINOPHEN 325 MG PO TABS
650.0000 mg | ORAL_TABLET | Freq: Four times a day (QID) | ORAL | Status: DC | PRN
  Administered 2012-05-22: 650 mg via ORAL

## 2012-05-22 MED ORDER — AMITRIPTYLINE HCL 25 MG PO TABS
25.0000 mg | ORAL_TABLET | Freq: Every day | ORAL | Status: DC
  Administered 2012-05-22 – 2012-05-24 (×3): 25 mg via ORAL
  Filled 2012-05-22 (×2): qty 1
  Filled 2012-05-22: qty 14
  Filled 2012-05-22 (×4): qty 1

## 2012-05-22 MED ORDER — ONDANSETRON 4 MG PO TBDP: 4.0000 mg | ORAL_TABLET | Freq: Four times a day (QID) | ORAL | Status: AC | PRN

## 2012-05-22 MED ORDER — THIAMINE HCL 100 MG/ML IJ SOLN: 100.0000 mg | Freq: Once | INTRAMUSCULAR | Status: DC

## 2012-05-22 MED ORDER — ACETAMINOPHEN 500 MG PO TABS: 1000.0000 mg | ORAL_TABLET | Freq: Four times a day (QID) | ORAL | Status: DC | PRN

## 2012-05-22 MED ORDER — NICOTINE 21 MG/24HR TD PT24
MEDICATED_PATCH | TRANSDERMAL | Status: AC
  Filled 2012-05-22: qty 1

## 2012-05-22 MED ORDER — GABAPENTIN 100 MG PO CAPS
100.0000 mg | ORAL_CAPSULE | Freq: Three times a day (TID) | ORAL | Status: DC
  Administered 2012-05-22 – 2012-05-25 (×9): 100 mg via ORAL
  Filled 2012-05-22 (×4): qty 1
  Filled 2012-05-22: qty 42
  Filled 2012-05-22 (×7): qty 1
  Filled 2012-05-22: qty 42
  Filled 2012-05-22 (×4): qty 1
  Filled 2012-05-22: qty 42

## 2012-05-22 MED ORDER — HYDROXYZINE HCL 25 MG PO TABS: 25.0000 mg | ORAL_TABLET | Freq: Four times a day (QID) | ORAL | Status: AC | PRN

## 2012-05-22 MED ORDER — NICOTINE 21 MG/24HR TD PT24
21.0000 mg | MEDICATED_PATCH | Freq: Every day | TRANSDERMAL | Status: DC
  Administered 2012-05-22 – 2012-05-25 (×3): 21 mg via TRANSDERMAL
  Filled 2012-05-22: qty 1
  Filled 2012-05-22: qty 14
  Filled 2012-05-22 (×4): qty 1

## 2012-05-22 MED ORDER — LOPERAMIDE HCL 2 MG PO CAPS: 2.0000 mg | ORAL_CAPSULE | ORAL | Status: AC | PRN

## 2012-05-22 MED ORDER — CHLORDIAZEPOXIDE HCL 25 MG PO CAPS
25.0000 mg | ORAL_CAPSULE | Freq: Four times a day (QID) | ORAL | Status: AC | PRN
  Administered 2012-05-22 (×2): 25 mg via ORAL
  Filled 2012-05-22 (×2): qty 1

## 2012-05-22 MED ORDER — CITALOPRAM HYDROBROMIDE 20 MG PO TABS
20.0000 mg | ORAL_TABLET | Freq: Every day | ORAL | Status: DC
  Administered 2012-05-22 – 2012-05-25 (×4): 20 mg via ORAL
  Filled 2012-05-22 (×5): qty 1
  Filled 2012-05-22: qty 14
  Filled 2012-05-22: qty 1

## 2012-05-22 MED ORDER — ALUM & MAG HYDROXIDE-SIMETH 200-200-20 MG/5ML PO SUSP: 30.0000 mL | ORAL | Status: DC | PRN

## 2012-05-22 MED ORDER — DIPHENHYDRAMINE HCL 25 MG PO CAPS
25.0000 mg | ORAL_CAPSULE | Freq: Every evening | ORAL | Status: DC | PRN
  Administered 2012-05-22 (×2): 25 mg via ORAL

## 2012-05-22 MED ORDER — ADULT MULTIVITAMIN W/MINERALS CH
1.0000 | ORAL_TABLET | Freq: Every day | ORAL | Status: DC
  Administered 2012-05-22 – 2012-05-25 (×4): 1 via ORAL
  Filled 2012-05-22 (×6): qty 1

## 2012-05-22 MED ORDER — VITAMIN B-1 100 MG PO TABS
100.0000 mg | ORAL_TABLET | Freq: Every day | ORAL | Status: DC
  Administered 2012-05-23 – 2012-05-25 (×3): 100 mg via ORAL
  Filled 2012-05-22 (×5): qty 1

## 2012-05-22 MED ORDER — CLOTRIMAZOLE 1 % VA CREA
1.0000 | TOPICAL_CREAM | Freq: Every day | VAGINAL | Status: DC
  Administered 2012-05-22 – 2012-05-24 (×3): 1 via VAGINAL
  Filled 2012-05-22: qty 45

## 2012-05-22 MED ORDER — MAGNESIUM HYDROXIDE 400 MG/5ML PO SUSP: 30.0000 mL | Freq: Every day | ORAL | Status: DC | PRN

## 2012-05-22 NOTE — Progress Notes (Signed)
[facility] Group Notes:  (Counselor/Nursing/MHT/Case Management/Adjunct)   05/22/2012 11:00AM Music As A Relapse Prevention Tool   Type of Therapy:  Group Therapy  Participation Level:  Did Not Attend     Cheyenne Fowler 05/22/2012  2:34 PM

## 2012-05-22 NOTE — Progress Notes (Deleted)
Patient requested Ativan for "nerves" states that she is thinking about her husband's indidelity. Writer unable to chart on Daily Cares flow sheet due to system start error. Patient denies SI/HI at this time.
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
## 2012-05-22 NOTE — H&P (Signed)
Medical/psychiatric screening examination/treatment/procedure(s) were performed by non-physician practitioner and as supervising physician I was immediately available for consultation/collaboration.  I have seen and examined this patient and agree the major elements of this evaluation.  

## 2012-05-22 NOTE — Discharge Planning (Signed)
Met with patient in Aftercare Planning Group.   She stated that her family and her fiance are working on her discharge plans, and that she trusts them.  She has been with her fiance 19 years.  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  Case Manager described in some detail the 3 options available to her as a [facility] resident without insurance:  [facility] for 28 days, [facility] for 14 days, or [facility] for 14-28 days.  She does not believe she could get transportation for the 90-mile trip to [facility].  She is going to consider the other two options.  Case Manager provided her with printed information about these possible rehab choices.  Patient stated that she is dealing with severe depression, and rated it as a "10" today, along with her hopelessness being at "10" today.  She did not wish to discuss the trigger(s) for this.  She is also suffering from insomnia.  There are no other case management needs today.  Ambrose Mantle, LCSW 05/22/2012, 9:45 AM

## 2012-05-22 NOTE — Tx Team (Addendum)
Interdisciplinary Treatment Plan Update (Adult)  Date:  05/22/2012  Time Reviewed:  10:00AM-11:00AM  Progress in Treatment: Attending groups:  Yes Participating in groups:    Yes, fully engaged Taking medication as prescribed:    Yes, although new Tolerating medication:   New, still under observation Family/Significant other contact made:  Counselor requesting consent to contact support system Patient understands diagnosis:   Yes, fair insight, poor judgment Discussing patient identified problems/goals with staff:   Yes Medical problems stabilized or resolved:   Yes Denies suicidal/homicidal ideation:  Yes Issues/concerns per patient self-inventory:   None Other:    New problem(s) identified:    Reason for Continuation of Hospitalization: Anxiety Depression Medication stabilization Withdrawal symptoms  Interventions implemented related to continuation of hospitalization:  Medication monitoring and adjustment, safety checks Q15 min., suicide risk assessment, group therapy, psychoeducation, collateral contact, aftercare planning, ongoing physician assessments, medication education  Additional comments:  Not applicable  Estimated length of stay:  3-5 days  Discharge Plan:  Two rehabilitation facilities are being considered by patient.  Otherwise, she lives with her fiance and family and can return there, needs referral for outpatient services.  New goal(s):  Not applicable  Review of initial/current patient goals per problem list:   1.  Goal(s):  Safely complete detox protocol.  Met:  No  Target date:  By Discharge   As evidenced by:  New patient  2.  Goal(s):  Reduce depression from 10 at admission to no greater than 3 at discharge.  Met:  No  Target date:  By Discharge   As evidenced by:  Today depression is at "10 - it's severe today"  3.  Goal(s):  Reduce hopelessness from 10 at admission to no greater than 3 at discharge.  Met:  No  Target date:  By Discharge    As evidenced by:  States that her hopelessness today is "10"  4.  Goal(s):  Determine if and how to address substance abuse issues at discharge.  Met:  No  Target date:  By Discharge   As evidenced by:  Patient is considering two rehab options, may want to go into a rehab facility for 2 weeks  5.  Goal(s): Return sleep to normal pattern, at least 6+ hours nightly.  Met:  No  Target date:  By Discharge   As evidenced by:  Continues not to sleep well; slept 4.75 hours last night.  6.  Goal(s): Medication stabilization.  Met:  No  Target date:  By Discharge   As evidenced by:  New patient, med stabilization is ongoing.   Attendees: Patient:     Family:     Physician:  Dr. Orson Aloe 05/22/2012 10:00AM-11:00AM  Nursing:  Alease Frame, RN 05/22/2012 10:00AM -11:00AM   Case Manager:  Ambrose Mantle, LCSW 05/22/2012 10:00AM-11:00AM  Counselor:  Angus Palms, LCSW 05/22/2012 10:00AM-11:00AM  Other:   Cheyenne Fowler, LCSWA 05/22/2012 10:00AM-11:00AM  Other:   Juline Patch, LCSW 05/22/2012 10:00AM-11:00AM  Other:   Barrie Folk, RN 05/22/2012  10:00AM-11:00AM  Other:       Scribe for Treatment Team:   Sarina Ser, 05/22/2012, 10:00AM-11:00AM

## 2012-05-22 NOTE — Discharge Instructions (Signed)
Attend 90 meetings in 90 days. Get trusted sponsor from the advise of others or from whomever in meetings seems to make sense, has a proven track record, will hold you responsible for your sobriety, and both expects and insists on total abstinence.  Work the steps HONESTLY with the trusted sponsor. Get obsessed with your recovery by often reminding yourself of how DEADLY this dredged horrible disease of addiction JUST IS. Focus the first month on speaker meetings where you specifically look at how your life has been wrecked by drugs/alcohol and how your life has been similar to that of the speakers.  Strongly consider attending at least 6 Alanon Meetings to help you learn about how your helping others to the exclusion of helping yourself is actually hurting yourself and is actually an addiction to fixing others and that you need to work the 12 Step to Happiness through the [employer]. Al-Anon Family Groups could be helpful with how to deal with substance abusing family and friends. Or your own issues of being in victim role.  There are only 40 Alanon Family Group meetings a week here in [place].  Online are current listing of those meetings @ [email or web address]  There are [employer].  Search on line and there you can learn the format and can access the schedule for yourself.  They ask you to temproarily block call waiting by starting with *70 then their number is [phone]  Anti whatever Alphabet (Where whatever stands for depression, anxiety, pain or BS in your life.)  A lternate between completely different solutions  B ehold beauty  C ommune with nature  D isplay affection thru hugs, words, understanding or Dance to new/different music  E at healthy food (like Fish Oil)  F eed wildlife  G o fishing  H ike in the woods or H unt down someone who has successfully met similar chalanges  I nspire someone else  [employer] or J ump into a new  hobby  K eep a diary of your successes  L isten to soothing music or L augh at yourself  [employer] music/ art/ poetry/ crafts  N otice something different about yourself, others, how others interact/ respond to each other, nature  O utside of yourself and typical way of doing/ thinking  P ick flowers  R andom acts of kindness without being discovered  S pend time on yourself/ Skin for beauty sake nails, teeth, hair, Soak in tub  T alk to a friend  U se grandma's ideas on how to handle things  V ary your routine  W alk  X tend your comfort zone where other's have invited you to go  Y oga/ other forms of meditation  Z ip up and go outside (or go outside of yourself)
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
## 2012-05-22 NOTE — H&P (Signed)
Psychiatric Admission Assessment Adult  Patient Identification:  Cheyenne Fowler  Date of Evaluation:  05/22/2012  Chief Complaint:  Depressive Disorder NOS; Polysubstance Abuse  History of Present Illness:: This is a 46 year old African-American female, admitted to [facility] from the [facility] ED with complaints of increased depression and polysubstance abuse problems. Patient reports, "I came here to get detoxification from drugs. I have been a drug addict for many years. I am also living with my fiance who is heavily on drugs as well. I am tired of getting high all the time for the past 6 years. I have grandchildren now. I want to and I'm willing to do something different for my life.  I have not tried to seek treatment in the past, or attempted to get off of drugs. But, I did stop drinking on my own. I am withdrawing from all these right now. I am cramping like crazy, stomach cramp. I am on my period too. I feel very miserable. I have been crying all day and night for the past few days for what I am doing to myself. I have been using cocaine every night x 5 nights a week, I smoke blunt on daily basis. I borrow valium pills from my mother to calm my nerve. I am ashamed of myself. I have been severely depressed x 1 month now. I used to be on Celexa and valium. It has been a long time since I take any kind of prescribed medications. I am not suicidal or feeling like hurt anyone else"   Mood Symptoms:  Hopelessness, Mood Swings, Past 2 Weeks, Sadness, Worthlessness,  Depression Symptoms:  depressed mood, feelings of worthlessness/guilt, difficulty concentrating, anxiety,  (Hypo) Manic Symptoms:  Irritable Mood,  Anxiety Symptoms:  Excessive Worry,  Psychotic Symptoms:  Hallucinations: None  PTSD Symptoms: Had a traumatic exposure:  None reported  Past Psychiatric History: Diagnosis: Psychoactive-induced mood disorder, Polysubstance abuse/dependency  Hospitalizations: [facility]    Outpatient Care: "I don't have one"  Substance Abuse Care: None reported  Self-Mutilation: None reported  Suicidal Attempts:None reported  Violent Behaviors: None reported   Past Medical History:   Past Medical History  Diagnosis Date  . Heart murmur   . Allergy      Allergies:   Allergies  Allergen Reactions  . Penicillins     REACTION: hives  . Sulfonamide Derivatives     REACTION: hives   PTA Medications: Prescriptions prior to admission  Medication Sig Dispense Refill  . diphenhydrAMINE (SOMINEX) 25 MG tablet Take 25 mg by mouth at bedtime as needed. For allergies      . ibuprofen (ADVIL,MOTRIN) 200 MG tablet Take 600 mg by mouth every 6 (six) hours as needed. For pain      . vitamin C (ASCORBIC ACID) 500 MG tablet Take 1,000 mg by mouth daily.      . vitamin E 100 UNIT capsule Take 100 Units by mouth daily.          Substance Abuse History in the last 12 months: Substance Age of 1st Use Last Use Amount Specific Type  Nicotine 14 Prior to hosp 1 pack daily cigarettes  Alcohol Denies use     Cannabis 26 Prior to hosp "I smoke daily" Weed  Opiates Denies use     Cocaine 26 Prior to hosp "I use daily" Crack cocaine  Methamphetamines Denies use     LSD Denies use     Ecstasy Denies use     Benzodiazepines  26 "I borrow Valium from mother from time to time"    Caffeine      Inhalants      Others:                         Consequences of Substance Abuse: Medical Consequences:  Liver damage Legal Consequences:  Arrests, jail time Family Consequences:  Family discord  Social History: Current Place of Residence: [employer] [place] of Birth:  [place]  Family Members: "My children and grand-children"  Marital Status:  Single  Children: 3  Sons:2  Daughters:1  Relationships: "I'm engaged"  Education:  GED  Educational Problems/Performance: "I did not complete high school"  Religious Beliefs/Practices: None reported  History of Abuse  (Emotional/Phsycial/Sexual): None reported  Occupational Experiences: Employed  Hotel manager History:  None.  Legal History: None reported  Hobbies/Interests: None reported  Family History:   Family History  Problem Relation Age of Onset  . Diabetes Mother   . Bipolar disorder Sister   . Schizophrenia Brother   . Cancer Maternal Grandfather     Mental Status Examination/Evaluation: Objective:  Appearance: Casual  Eye Contact::  Good  Speech:  Clear and Coherent  Volume:  Normal  Mood:  Depressed and Irritable, mood swings.  Affect:  Flat and Tearful  Thought Process:  Coherent  Orientation:  Full  Thought Content:  Rumination  Suicidal Thoughts:  No  Homicidal Thoughts:  No  Memory:  Immediate;   Good Recent;   Good Remote;   Good  Judgement:  Poor  Insight:  Good  Psychomotor Activity:  Restlessness, Tremor and Cramps  Concentration:  Poor  Recall:  Good  Akathisia:  No  Handed:  Right  AIMS (if indicated):     Assets:  Desire for Improvement  Sleep:  Number of Hours: 4.75     Laboratory/X-Ray: None  Psychological Evaluation(s)      Assessment:    AXIS I:  Substance Induced Mood Disorder and Polysubstance abuse and dependency. AXIS II:  Deferred AXIS III:   Past Medical History  Diagnosis Date  . Heart murmur   . Allergy    AXIS IV:  other psychosocial or environmental problems and problems related to social environment AXIS V:  11-20 some danger of hurting self or others possible OR occasionally fails to maintain minimal personal hygiene OR gross impairment in communication, Polysubstance abuse and dependency.  Treatment Plan/Recommendations: Admit for safety and stabilization. Review and reinstate any pertinent home medications for your other health conditions. Continue with detox protocol.  Treatment Plan Summary: Daily contact with patient to assess and evaluate symptoms and progress in treatment Medication management  Current Medications:    Current Facility-Administered Medications  Medication Dose Route Frequency Provider Last Rate Last Dose  . acetaminophen (TYLENOL) tablet 650 mg  650 mg Oral Q6H PRN Larena Sox, MD   650 mg at 05/22/12 1032  . alum & mag hydroxide-simeth (MAALOX/MYLANTA) 200-200-20 MG/5ML suspension 30 mL  30 mL Oral Q4H PRN Larena Sox, MD      . chlordiazePOXIDE (LIBRIUM) capsule 25 mg  25 mg Oral Q6H PRN Larena Sox, MD   25 mg at 05/22/12 1032  . diphenhydrAMINE (BENADRYL) capsule 25 mg  25 mg Oral QHS PRN Larena Sox, MD   25 mg at 05/22/12 0025  . hydrOXYzine (ATARAX/VISTARIL) tablet 25 mg  25 mg Oral Q6H PRN Larena Sox, MD      . loperamide (IMODIUM)  capsule 2-4 mg  2-4 mg Oral PRN Larena Sox, MD      . magnesium hydroxide (MILK OF MAGNESIA) suspension 30 mL  30 mL Oral Daily PRN Larena Sox, MD      . mulitivitamin with minerals tablet 1 tablet  1 tablet Oral Daily Larena Sox, MD   1 tablet at 05/22/12 0839  . nicotine (NICODERM CQ - dosed in mg/24 hours) 21 mg/24hr patch           . nicotine (NICODERM CQ - dosed in mg/24 hours) patch 21 mg  21 mg Transdermal Daily Mike Craze, MD   21 mg at 05/22/12 1104  . ondansetron (ZOFRAN-ODT) disintegrating tablet 4 mg  4 mg Oral Q6H PRN Larena Sox, MD      . thiamine (B-1) injection 100 mg  100 mg Intramuscular Once Larena Sox, MD      . thiamine (VITAMIN B-1) tablet 100 mg  100 mg Oral Daily Larena Sox, MD       Facility-Administered Medications Ordered in Other Encounters  Medication Dose Route Frequency Provider Last Rate Last Dose  . LORazepam (ATIVAN) tablet 1 mg  1 mg Oral Once Raeford Razor, MD   1 mg at 05/21/12 2035  . DISCONTD: acetaminophen (TYLENOL) tablet 650 mg  650 mg Oral Q4H PRN Raeford Razor, MD      . DISCONTD: alum & mag hydroxide-simeth (MAALOX/MYLANTA) 200-200-20 MG/5ML suspension 30 mL  30 mL Oral PRN Raeford Razor, MD      . DISCONTD: ibuprofen (ADVIL,MOTRIN) tablet 600 mg   600 mg Oral Q8H PRN Raeford Razor, MD      . DISCONTD: LORazepam (ATIVAN) tablet 1 mg  1 mg Oral Q8H PRN Raeford Razor, MD      . DISCONTD: nicotine (NICODERM CQ - dosed in mg/24 hours) patch 21 mg  21 mg Transdermal Daily Raeford Razor, MD      . DISCONTD: ondansetron [facility]) tablet 4 mg  4 mg Oral Q8H PRN Raeford Razor, MD      . DISCONTD: zolpidem (AMBIEN) tablet 5 mg  5 mg Oral QHS PRN Raeford Razor, MD        Observation Level/Precautions:  Q 15 minutes checks for safety  Laboratory:  Per ED lab report findings: (+)Benzos, Cocaine, THC  Psychotherapy:  Group  Medications:  See lists  Routine PRN Medications:  Yes  Consultations:  None indicated  Discharge Concerns:  Safety  Other:     Armandina Stammer I 5/31/201311:26 AM

## 2012-05-22 NOTE — Progress Notes (Addendum)
Patient states that she slept better after she was given a sleep medication. Reports appetite improving, energy level low and ability to pay attention poor. Rates her depression at 5/10 and feelings of hopelessness as 5/10. Patient denies pain and rates her goal as a 6/10. She does report a feeling of generalized weakness in her knees which she attributes to an arduous admission process. At this point patient is assimilating into groups and is eager to start on discharge plan. She shared that she would like to find support groups in her community and to join a faith community. Patient encouraged to increase fluid intake and was provided with a pitcher of ice-water. Denies SI/HI but endorses anhedonia and depression. No further questions voiced by patient. Patient encouraged to attend all unit activities. Joice Lofts RN MS EdS 05/22/2012  9:10 AM

## 2012-05-22 NOTE — Tx Team (Addendum)
Initial Interdisciplinary Treatment Plan  PATIENT STRENGTHS: (choose at least two) Active sense of humor Capable of independent living Communication skills General fund of knowledge Motivation for treatment/growth Religious Affiliation Supportive family/friends  PATIENT STRESSORS: Financial difficulties Marital or family conflict Occupational concerns Substance abuse   PROBLEM LIST: Problem List/Patient Goals Date to be addressed Date deferred Reason deferred Estimated date of resolution  Depression 05/21/12   Discharge  Substance Abuse 05/21/12   Discharge  Detox 05/21/12   Discharge                                       DISCHARGE CRITERIA:  Adequate post-discharge living arrangements Improved stabilization in mood, thinking, and/or behavior Motivation to continue treatment in a less acute level of care Verbal commitment to aftercare and medication compliance Withdrawal symptoms are absent or subacute and managed without 24-hour nursing intervention  PRELIMINARY DISCHARGE PLAN: Attend aftercare/continuing care group Attend 12-step recovery group Placement in alternative living arrangements  PATIENT/FAMIILY INVOLVEMENT: This treatment plan has been presented to and reviewed with the patient, Cheyenne Fowler, and/or family member.  The patient and family have been given the opportunity to ask questions and make suggestions.  [name] 05/22/2012, 12:39 AM

## 2012-05-22 NOTE — Progress Notes (Addendum)
[facility] MD Progress Note  05/22/2012 3:35 PM  Diagnosis:  Axis I: Alcohol Abuse, Substance Abuse and Substance Induced Mood Disorder  ADL's:  Intact  Sleep: Poor  Appetite:  Fair  Suicidal Ideation:  Pt had plans of killing herself, but has no current suicidal thoughts Homicidal Ideation:  Denies any homicidal thoughts.  Mental Status Examination/Evaluation: Objective:  Appearance: Casual  Eye Contact::  Fair  Speech:  Clear and Coherent  Volume:  Normal  Mood:  Anxious, Depressed, Hopeless and Irritable  Affect:  Congruent  Thought Process:  Coherent  Orientation:  Full  Thought Content:  WDL  Suicidal Thoughts:  No  Homicidal Thoughts:  No  Memory:  Immediate;   Fair  Judgement:  Impaired  Insight:  Lacking  Psychomotor Activity:  Normal  Concentration:  Fair  Recall:  Fair  Akathisia:  No  Handed:  Right  AIMS (if indicated):     Assets:  Communication Skills Desire for Improvement  Sleep:  Number of Hours: 4.75    Vital Signs:Blood pressure 100/75, pulse 73, temperature 98.2 F (36.8 C), temperature source Oral, resp. rate 16, height 5\' 2"  (1.575 m), weight 73.029 kg (161 lb), last menstrual period 04/25/2012. Current Medications: Current Facility-Administered Medications  Medication Dose Route Frequency Provider Last Rate Last Dose  . acetaminophen (TYLENOL) tablet 1,000 mg  1,000 mg Oral Q6H PRN Sanjuana Kava, NP      . alum & mag hydroxide-simeth (MAALOX/MYLANTA) 200-200-20 MG/5ML suspension 30 mL  30 mL Oral Q4H PRN Larena Sox, MD      . amitriptyline (ELAVIL) tablet 25 mg  25 mg Oral QHS Mike Craze, MD      . carbamazepine (TEGRETOL) tablet 200 mg  200 mg Oral Q2000 Sanjuana Kava, NP      . chlordiazePOXIDE (LIBRIUM) capsule 25 mg  25 mg Oral Q6H PRN Larena Sox, MD   25 mg at 05/22/12 1032  . citalopram (CELEXA) tablet 20 mg  20 mg Oral Daily Sanjuana Kava, NP   20 mg at 05/22/12 1305  . clotrimazole (GYNE-LOTRIMIN) vaginal cream 1  Applicatorful  1 Applicatorful Vaginal QHS Mike Craze, MD      . diphenhydrAMINE (BENADRYL) capsule 25 mg  25 mg Oral QHS PRN Larena Sox, MD   25 mg at 05/22/12 0025  . gabapentin (NEURONTIN) capsule 100 mg  100 mg Oral TID Mike Craze, MD      . hydrOXYzine (ATARAX/VISTARIL) tablet 25 mg  25 mg Oral Q6H PRN Larena Sox, MD      . loperamide (IMODIUM) capsule 2-4 mg  2-4 mg Oral PRN Larena Sox, MD      . magnesium hydroxide (MILK OF MAGNESIA) suspension 30 mL  30 mL Oral Daily PRN Larena Sox, MD      . mulitivitamin with minerals tablet 1 tablet  1 tablet Oral Daily Larena Sox, MD   1 tablet at 05/22/12 0839  . nicotine (NICODERM CQ - dosed in mg/24 hours) 21 mg/24hr patch           . nicotine (NICODERM CQ - dosed in mg/24 hours) patch 21 mg  21 mg Transdermal Daily Mike Craze, MD   21 mg at 05/22/12 1104  . ondansetron (ZOFRAN-ODT) disintegrating tablet 4 mg  4 mg Oral Q6H PRN Larena Sox, MD      . thiamine (B-1) injection 100 mg  100 mg Intramuscular Once Larena Sox, MD      .  thiamine (VITAMIN B-1) tablet 100 mg  100 mg Oral Daily Larena Sox, MD      . DISCONTD: acetaminophen (TYLENOL) tablet 650 mg  650 mg Oral Q6H PRN Larena Sox, MD   650 mg at 05/22/12 1032   Facility-Administered Medications Ordered in Other Encounters  Medication Dose Route Frequency Provider Last Rate Last Dose  . LORazepam (ATIVAN) tablet 1 mg  1 mg Oral Once Raeford Razor, MD   1 mg at 05/21/12 2035  . DISCONTD: acetaminophen (TYLENOL) tablet 650 mg  650 mg Oral Q4H PRN Raeford Razor, MD      . DISCONTD: alum & mag hydroxide-simeth (MAALOX/MYLANTA) 200-200-20 MG/5ML suspension 30 mL  30 mL Oral PRN Raeford Razor, MD      . DISCONTD: ibuprofen (ADVIL,MOTRIN) tablet 600 mg  600 mg Oral Q8H PRN Raeford Razor, MD      . DISCONTD: LORazepam (ATIVAN) tablet 1 mg  1 mg Oral Q8H PRN Raeford Razor, MD      . DISCONTD: nicotine (NICODERM CQ - dosed in mg/24 hours)  patch 21 mg  21 mg Transdermal Daily Raeford Razor, MD      . DISCONTD: ondansetron [facility]) tablet 4 mg  4 mg Oral Q8H PRN Raeford Razor, MD      . DISCONTD: zolpidem (AMBIEN) tablet 5 mg  5 mg Oral QHS PRN Raeford Razor, MD        Lab Results:  Results for orders placed during the hospital encounter of 05/21/12 (from the past 48 hour(s))  CBC     Status: Abnormal   Collection Time   05/21/12  4:10 PM      Component Value Range Comment   WBC 8.5  4.0 - 10.5 (K/uL)    RBC 4.44  3.87 - 5.11 (MIL/uL)    Hemoglobin 10.6 (*) 12.0 - 15.0 (g/dL)    HCT 16.1 (*) 09.6 - 46.0 (%)    MCV 76.6 (*) 78.0 - 100.0 (fL)    MCH 23.9 (*) 26.0 - 34.0 (pg)    MCHC 31.2  30.0 - 36.0 (g/dL)    RDW 04.5 (*) 40.9 - 15.5 (%)    Platelets 346  150 - 400 (K/uL)   COMPREHENSIVE METABOLIC PANEL     Status: Abnormal   Collection Time   05/21/12  4:10 PM      Component Value Range Comment   Sodium 138  135 - 145 (mEq/L)    Potassium 3.9  3.5 - 5.1 (mEq/L)    Chloride 104  96 - 112 (mEq/L)    CO2 26  19 - 32 (mEq/L)    Glucose, Bld 94  70 - 99 (mg/dL)    BUN 11  6 - 23 (mg/dL)    Creatinine, Ser 8.11  0.50 - 1.10 (mg/dL)    Calcium 9.3  8.4 - 10.5 (mg/dL)    Total Protein 6.7  6.0 - 8.3 (g/dL)    Albumin 3.5  3.5 - 5.2 (g/dL)    AST 16  0 - 37 (U/L)    ALT 24  0 - 35 (U/L)    Alkaline Phosphatase 83  39 - 117 (U/L)    Total Bilirubin 0.2 (*) 0.3 - 1.2 (mg/dL)    GFR calc non Af Amer 77 (*) >90 (mL/min)    GFR calc Af Amer 89 (*) >90 (mL/min)   ETHANOL     Status: Normal   Collection Time   05/21/12  4:10 PM      Component  Value Range Comment   Alcohol, Ethyl (B) <11  0 - 11 (mg/dL)   URINE RAPID DRUG SCREEN (HOSP PERFORMED)     Status: Abnormal   Collection Time   05/21/12  4:26 PM      Component Value Range Comment   Opiates NONE DETECTED  NONE DETECTED     Cocaine POSITIVE (*) NONE DETECTED     Benzodiazepines POSITIVE (*) NONE DETECTED     Amphetamines NONE DETECTED  NONE DETECTED      Tetrahydrocannabinol POSITIVE (*) NONE DETECTED     Barbiturates NONE DETECTED  NONE DETECTED    PREGNANCY, URINE     Status: Normal   Collection Time   05/21/12  4:26 PM      Component Value Range Comment   Preg Test, Ur NEGATIVE  NEGATIVE      Physical Findings: AIMS:  , ,  ,  ,    CIWA:  CIWA-Ar Total: 3  COWS:     Treatment Plan Summary: Daily contact with patient to assess and evaluate symptoms and progress in treatment Medication management No signs or symptoms of withdrawal. Mood/anxiety less than 3/10 where the scale is 1 is the best and 10 is the worst  Plan: Admit, put on Librium protocol, try Elavil plus Neurontin for nerve pain of cervical disc origin. Start Monistat 7 for vaginal infection Discussed the risks, benefits, and probable clinical course with and without treatment.  Pt is agreeable to the current course of treatment.  Thelma Lorenzetti 05/22/2012, 3:35 PM

## 2012-05-23 MED ORDER — RISPERIDONE 0.5 MG PO TABS
0.5000 mg | ORAL_TABLET | Freq: Four times a day (QID) | ORAL | Status: DC | PRN
  Filled 2012-05-23: qty 1

## 2012-05-23 MED ORDER — RISPERIDONE 0.5 MG PO TABS
0.5000 mg | ORAL_TABLET | ORAL | Status: DC
  Filled 2012-05-23: qty 1

## 2012-05-23 NOTE — Progress Notes (Signed)
Pt doing well on the unit. Has been attending the groups, participating and interacting with select peers. Denies SI and HI. Rates her depression and hopelessness at a 6. Denies SI and HI. Given support reassurance and praise. States that she is glad that she came here and is learning a lot of new coping skills.

## 2012-05-23 NOTE — Progress Notes (Signed)
[facility] Group Notes:  (Counselor/Nursing/MHT/Case Management/Adjunct)  05/23/2012 6:45 PM  Type of Therapy:  Psychoeducational Skills  Participation Level:  Active  Participation Quality:  Appropriate, Attentive, Sharing and Supportive  Affect:  Appropriate  Cognitive:  Alert, Appropriate and Oriented  Insight:  Good  Engagement in Group:  Good  Engagement in Therapy:  n/a  Modes of Intervention:  Activity, Education, Problem-solving, Socialization and Support  Summary of Progress/Problems: Jaylee participated in game that asked fun questions and self and peers. Haden participated in group discussion about how quality time with support people can strengthen support relationship. Group outlined ways they have sabotaged their support relationships in the past and how they can use them in a positive way in the future. Trista was given a homework assignment and handouts on how to strengthen support systems.    Wandra Scot 05/23/2012, 6:45 PM
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
## 2012-05-23 NOTE — Progress Notes (Signed)
[facility] Group Notes:  (Counselor/Nursing/MHT/Case Management/Adjunct)  05/23/2012 7:02 PM  Type of Therapy:  Group Therapy  Participation Level:  Active  Participation Quality:  Appropriate and Attentive  Affect:  Appropriate  Cognitive:  Appropriate  Insight:  Good  Engagement in Group:  Good  Engagement in Therapy:  Good  Modes of Intervention:  Limit-setting, Problem-solving, Socialization and Support  Summary of Progress/Problems: Pt. participated in group on self sabotaging behaviors and what it means to them and who they self sabotage themselves. Pt.'s also were asked how they could positively enable themselves to make the changes in their lives and to end their self sabotaging behaviors. Each pt. Gave thoughts on handouts that dealt with self sabotaging behaviors.  Lamar Blinks [place] 05/23/2012, 7:02 PM

## 2012-05-23 NOTE — Progress Notes (Signed)
  Cheyenne Fowler is a 46 y.o. female [id number] 1966/10/04  05/21/2012 Principal Problem:  *Polysubstance (excluding opioids) dependence Active Problems:  Psychoactive substance-induced organic mood disorder  Polysubstance abuse   Mental Status: Mood is better because she has had less pain denies SI/HI/AVH or cravings.     Subjective/Objective: Slept better and pain is improved. Wants to find out about where she will be able to get her meds after discharge.    Filed Vitals:   05/23/12 1302  BP: 113/76  Pulse: 88  Temp:   Resp:     Lab Results:   BMET    Component Value Date/Time   NA 138 05/21/2012 1610   K 3.9 05/21/2012 1610   CL 104 05/21/2012 1610   CO2 26 05/21/2012 1610   GLUCOSE 94 05/21/2012 1610   BUN 11 05/21/2012 1610   CREATININE 0.89 05/21/2012 1610   CALCIUM 9.3 05/21/2012 1610   GFRNONAA 77* 05/21/2012 1610   GFRAA 89* 05/21/2012 1610    Medications:  Scheduled:     . amitriptyline  25 mg Oral QHS  . carbamazepine  200 mg Oral Q2000  . citalopram  20 mg Oral Daily  . clotrimazole  1 Applicatorful Vaginal QHS  . gabapentin  100 mg Oral TID  . mulitivitamin with minerals  1 tablet Oral Daily  . nicotine      . nicotine  21 mg Transdermal Daily  . thiamine  100 mg Intramuscular Once  . thiamine  100 mg Oral Daily     PRN Meds acetaminophen, alum & mag hydroxide-simeth, chlordiazePOXIDE, diphenhydrAMINE, hydrOXYzine, loperamide, magnesium hydroxide, ondansetron  Need to check her thyroid otherwise continue current plan of care.   Contrell Ballentine,MICKIE D. 05/23/2012

## 2012-05-24 NOTE — Progress Notes (Signed)
  Cheyenne Fowler is a 46 y.o. female [id number] 02/02/1966  05/21/2012 Principal Problem:  *Polysubstance (excluding opioids) dependence Active Problems:  Psychoactive substance-induced organic mood disorder  Polysubstance abuse   Mental Status:Mood is good denies active SI/HI AVH.  Subjective/Objective: slept well feels meds are right-asks for a list happy that she visited with daughter and grandchildren yesterday. Fiancee doesn't want to loose her realizes she is serious about changing herself and he is going to try himself as well.     Filed Vitals:   05/24/12 0817  BP: 111/76  Pulse: 78  Temp:   Resp:     Lab Results:   BMET    Component Value Date/Time   NA 138 05/21/2012 1610   K 3.9 05/21/2012 1610   CL 104 05/21/2012 1610   CO2 26 05/21/2012 1610   GLUCOSE 94 05/21/2012 1610   BUN 11 05/21/2012 1610   CREATININE 0.89 05/21/2012 1610   CALCIUM 9.3 05/21/2012 1610   GFRNONAA 77* 05/21/2012 1610   GFRAA 89* 05/21/2012 1610    Medications:  Scheduled:     . amitriptyline  25 mg Oral QHS  . carbamazepine  200 mg Oral Q2000  . citalopram  20 mg Oral Daily  . clotrimazole  1 Applicatorful Vaginal QHS  . gabapentin  100 mg Oral TID  . mulitivitamin with minerals  1 tablet Oral Daily  . nicotine  21 mg Transdermal Daily  . thiamine  100 mg Intramuscular Once  . thiamine  100 mg Oral Daily  . DISCONTD: risperiDONE  0.5 mg Oral NOW     PRN Meds acetaminophen, alum & mag hydroxide-simeth, chlordiazePOXIDE, diphenhydrAMINE, hydrOXYzine, loperamide, magnesium hydroxide, ondansetron, DISCONTD: risperiDONE  Plan: Continue current plan of care.   Jlynn Langille,MICKIE D. 05/24/2012

## 2012-05-24 NOTE — Progress Notes (Signed)
Pt rates her depression at a 5, her hopelessness at a 4 and denies SI and HI. Did attend the morning groups, went to lunch and then took a nap after lunch. Affect and mood are brighter than yesterday. States that she is feeling better overall. States taht she is having trouble with menopause and wanted to know what to do about it. Encouraged to talk with her primary doctor when she sees him next. Given support

## 2012-05-24 NOTE — Progress Notes (Signed)
Patient has been up in the dayroom laughing and talking with select peers. Patient was informed of her medications and is agreeable to taking them. Patient reported that she has learned a lot since being here and is going to go to her room and work on her homework. Patient currently denies having pain even though her menstrual cycle is on. Patient given meds, support and encouragement offered. Denies si/hi/auditory/visual hall. Safety maintained on unit, will continue to monitor.

## 2012-05-25 MED ORDER — CITALOPRAM HYDROBROMIDE 20 MG PO TABS: 20.0000 mg | ORAL_TABLET | Freq: Every day | ORAL | Status: DC

## 2012-05-25 MED ORDER — DIPHENHYDRAMINE HCL (SLEEP) 25 MG PO TABS: 25.0000 mg | ORAL_TABLET | Freq: Every evening | ORAL | Status: DC | PRN

## 2012-05-25 MED ORDER — CARBAMAZEPINE 200 MG PO TABS: 200.0000 mg | ORAL_TABLET | Freq: Every day | ORAL | Status: DC

## 2012-05-25 MED ORDER — GABAPENTIN 100 MG PO CAPS: 100.0000 mg | ORAL_CAPSULE | Freq: Three times a day (TID) | ORAL | Status: DC

## 2012-05-25 MED ORDER — AMITRIPTYLINE HCL 25 MG PO TABS: 25.0000 mg | ORAL_TABLET | Freq: Every day | ORAL | Status: DC

## 2012-05-25 MED ORDER — NICOTINE 21 MG/24HR TD PT24: 1.0000 | MEDICATED_PATCH | Freq: Every day | TRANSDERMAL | Status: AC

## 2012-05-25 MED ORDER — CLOTRIMAZOLE 1 % VA CREA: 1.0000 | TOPICAL_CREAM | Freq: Every day | VAGINAL | Status: AC

## 2012-05-25 NOTE — Progress Notes (Addendum)
[facility] MD Progress Note  05/25/2012 2:10 PM  Diagnosis:  Axis I: Alcohol Abuse, Substance Abuse and Substance Induced Mood Disorder  ADL's:  Intact  Sleep: Good  Appetite:  Good  Suicidal Ideation:  Pt had plans of killing herself, but has no current suicidal thoughts Homicidal Ideation:  Denies any homicidal thoughts.  Mental Status Examination/Evaluation: Objective:  Appearance: Casual  Eye Contact::  Good  Speech:  Clear and Coherent  Volume:  Normal  Mood:  Euthymic  Affect:  Congruent  Thought Process:  Coherent  Orientation:  Full  Thought Content:  WDL  Suicidal Thoughts:  No  Homicidal Thoughts:  No  Memory:  Immediate;   Fair  Judgement:  Good  Insight:  Good  Psychomotor Activity:  Normal  Concentration:  Good  Recall:  Good  Akathisia:  No  Handed:  Right  AIMS (if indicated):     Assets:  Communication Skills Desire for Improvement  Sleep:  Number of Hours: 5.75    Vital Signs:Blood pressure 106/69, pulse 86, temperature 98.4 F (36.9 C), temperature source Oral, resp. rate 18, height 5\' 2"  (1.575 m), weight 73.029 kg (161 lb), last menstrual period 04/25/2012. Current Medications: Current Facility-Administered Medications  Medication Dose Route Frequency Provider Last Rate Last Dose  . acetaminophen (TYLENOL) tablet 1,000 mg  1,000 mg Oral Q6H PRN Sanjuana Kava, NP      . alum & mag hydroxide-simeth (MAALOX/MYLANTA) 200-200-20 MG/5ML suspension 30 mL  30 mL Oral Q4H PRN Larena Sox, MD      . amitriptyline (ELAVIL) tablet 25 mg  25 mg Oral QHS Mike Craze, MD   25 mg at 05/24/12 2210  . carbamazepine (TEGRETOL) tablet 200 mg  200 mg Oral Q2000 Sanjuana Kava, NP   200 mg at 05/24/12 1940  . chlordiazePOXIDE (LIBRIUM) capsule 25 mg  25 mg Oral Q6H PRN Larena Sox, MD   25 mg at 05/22/12 1032  . citalopram (CELEXA) tablet 20 mg  20 mg Oral Daily Sanjuana Kava, NP   20 mg at 05/25/12 0827  . clotrimazole (GYNE-LOTRIMIN) vaginal cream 1  Applicatorful  1 Applicatorful Vaginal QHS Mike Craze, MD   1 Applicatorful at 05/24/12 2210  . diphenhydrAMINE (BENADRYL) capsule 25 mg  25 mg Oral QHS PRN Larena Sox, MD   25 mg at 05/22/12 2157  . gabapentin (NEURONTIN) capsule 100 mg  100 mg Oral TID Mike Craze, MD   100 mg at 05/25/12 0827  . hydrOXYzine (ATARAX/VISTARIL) tablet 25 mg  25 mg Oral Q6H PRN Larena Sox, MD      . loperamide (IMODIUM) capsule 2-4 mg  2-4 mg Oral PRN Larena Sox, MD      . magnesium hydroxide (MILK OF MAGNESIA) suspension 30 mL  30 mL Oral Daily PRN Larena Sox, MD      . mulitivitamin with minerals tablet 1 tablet  1 tablet Oral Daily Larena Sox, MD   1 tablet at 05/25/12 0825  . nicotine (NICODERM CQ - dosed in mg/24 hours) patch 21 mg  21 mg Transdermal Daily Mike Craze, MD   21 mg at 05/25/12 0829  . ondansetron (ZOFRAN-ODT) disintegrating tablet 4 mg  4 mg Oral Q6H PRN Larena Sox, MD      . thiamine (B-1) injection 100 mg  100 mg Intramuscular Once Larena Sox, MD      . thiamine (VITAMIN B-1) tablet 100 mg  100  mg Oral Daily Larena Sox, MD   100 mg at 05/25/12 0827    Lab Results:  Results for orders placed during the hospital encounter of 05/21/12 (from the past 48 hour(s))  TSH     Status: Normal   Collection Time   05/23/12  7:30 PM      Component Value Range Comment   TSH 0.828  0.350 - 4.500 (uIU/mL)     Physical Findings: AIMS:  , ,  ,  ,    CIWA:  CIWA-Ar Total: 0  COWS:     Treatment Plan Summary: Daily contact with patient to assess and evaluate symptoms and progress in treatment Medication management No signs or symptoms of withdrawal. Mood/anxiety less than 3/10 where the scale is 1 is the best and 10 is the worst  Plan: D/C today.  Pt has a good plan to help herself be safe from drugs in the future.  Jenniferann Stuckert 05/25/2012, 2:10 PM

## 2012-05-25 NOTE — Progress Notes (Signed)
Discharged from [facility] w/scripts, samples, instructions, release of information, denies Si or hi and is excited about going home today. No problems/concererns voiced,picked  Up by family member.

## 2012-05-25 NOTE — Progress Notes (Signed)
Patient has been up and active, talking and laughing with select peers. Patient reports having visitors from her daughter, grand daughter and fiance. Patient voiced no complaints, denies pain, -si/hi/ auditory/ visual hallucinations. Safety maintained on unit, will continue to monitor.

## 2012-05-25 NOTE — Discharge Summary (Signed)
Physician Discharge Summary Note  Patient:  Cheyenne Fowler is an 46 y.o., female MRN:  [id number] DOB:  07/28/1966 Patient phone:  [phone] (home)  Patient address:   [address] [place] [address],   Date of Admission:  05/21/2012 Date of Discharge: 05/25/12  Reason for Admission: Substance abuse detox and increased symptoms of depression.  Discharge Diagnoses: Principal Problem:  *Polysubstance (excluding opioids) dependence Active Problems:  Psychoactive substance-induced organic mood disorder  Polysubstance abuse   Axis Diagnosis:   AXIS I:  Psychoactive substance-induced organic mood disorder, Polysubstance abuse AXIS II:  Deferred AXIS III:   Past Medical History  Diagnosis Date  . Heart murmur   . Allergy    AXIS IV:  other psychosocial or environmental problems AXIS V:  67  Level of Care:  OP  Hospital Course: This is a 47 year old African-American female, admitted to [facility] from the [facility] with complaints of increased depression and polysubstance abuse problems. Patient reports, "I came here to get detoxification from drugs. I have been a drug addict for many years. I am also living with my fiance who is heavily on drugs as well. I am tired of getting high all the time for the past 6 years. I have grandchildren now. I want to and I'm willing to do something different for my life. I have not tried to seek treatment in the past, or attempted to get off of drugs. But, I did stop drinking on my own. I am withdrawing from all these right now. I am cramping like crazy, stomach cramp. I am on my period too. I feel very miserable. I have been crying all day and night for the past few days for what I am doing to myself. I have been using cocaine every night x 5 nights a week, I smoke blunt on daily basis. I borrow valium pills from my mother to calm my nerve. I am ashamed of myself. I have been severely depressed x 1 month now. I used to be on Celexa  and valium".  While a patient in this hospital, Ms. Eye received medication management as well as enrolled in group counseling sessions and activities. She was started on medication regimens to combat her multiple symptoms. She received Elavil (amitriptyline 25 mg for pain management control/insonia, Tegretol 200 mg Q bedtime for mood control, Citalopram 20 mg daily for depression and Neurontin 100 mg for anxiety symptoms. She also participated in group counseling and activities as recommended.   Patient also received medication management for and monitoring for other health issues and or complaints. She was treated for vaginal yeast infection with  Clotrimazole 1% cream. She tolerated her treatment regimen without significant adverse effects and or reactions.   As her treatment regimen continued, patient presented on daily basis with improved affects and reduction of symptoms. This is also evidenced by her daily reports of improved symptoms. She attended treatment team meeting this am and met with her treatment team members. Her symptoms were discussed. Patient presented to the team that she is stable for discharge. She will continue psychiatric care on outpatient basis to maintain stability. She [facility]. The address, date and time for this appointment provided for patient. Patient also received 2 weeks worth samples of her discharge medications. Upon discharge, she adamantly denies suicidal, homicidal ideations, auditory, visual hallucinations and or delusional thinking. Patient left [facility] facility with all personal belongings  via personal arranged transport in no apparent distress.    Consults:  None  Significant Diagnostic Studies:  labs: CBC with diff, CMP, Toxicology  Discharge Vitals:   Blood pressure 106/69, pulse 86, temperature 98.4 F (36.9 C), temperature source Oral, resp. rate 18, height 5\' 2"  (1.575 m), weight 73.029 kg  (161 lb), last menstrual period 04/25/2012.  Mental Status Exam: See Mental Status Examination and Suicide Risk Assessment completed by Attending Physician prior to discharge.  Discharge destination:  Home  Is patient on multiple antipsychotic therapies at discharge:  No   Has Patient had three or more failed trials of antipsychotic monotherapy by history:  No  Recommended Plan for Multiple Antipsychotic Therapies: NA   Medication List  As of 05/25/2012  2:06 PM   STOP taking these medications         ibuprofen 200 MG tablet      vitamin C 500 MG tablet      vitamin E 100 UNIT capsule         TAKE these medications      Indication    amitriptyline 25 MG tablet   Commonly known as: ELAVIL   Take 1 tablet (25 mg total) by mouth at bedtime. For pain control/sleep/depression       carbamazepine 200 MG tablet   Commonly known as: TEGRETOL   Take 1 tablet (200 mg total) by mouth daily at 8 pm. For mood control       citalopram 20 MG tablet   Commonly known as: CELEXA   Take 1 tablet (20 mg total) by mouth daily. For depression       clotrimazole 1 % vaginal cream   Commonly known as: GYNE-LOTRIMIN   Place 1 Applicatorful vaginally at bedtime. For vaginal yeast infection       diphenhydrAMINE 25 MG tablet   Commonly known as: SOMINEX   Take 1 tablet (25 mg total) by mouth at bedtime as needed for allergies (Allergies). For allergies       gabapentin 100 MG capsule   Commonly known as: NEURONTIN   Take 1 capsule (100 mg total) by mouth 3 (three) times daily. For anxiety/pain            Follow-up Information    Follow up with [facility]. [facility] on 05/29/2012. (You are scheduled with Dr. Barrie Folk at 12:00 Noon on Friday, May 29, 2012)    Contact information:   [address] [place], [place]  [address]  [phone]         Follow-up recommendations:  Activity:  as tolerated Other:  Keep all scheduled follow-up appointments as recommended.  Comments:  Take  all medications as prescribed. Report any adverse effects to your outpatient provider promptly. In the event of worsening symptoms, patient is instructed to call the crisis hotline, 911 and or go to the nearest ED. Instructed patient to abstain from the use of alcohol and or illegal drugs while on prescription medicines.   SignedArmandina Stammer I 05/25/2012, 2:06 PM

## 2012-05-25 NOTE — Progress Notes (Signed)
[facility] Case Management Discharge Plan:  Will you be returning to the same living situation after discharge: Yes,  Patient will return to her home At discharge, do you have transportation home?:Yes,  Patient reports having transportation home Do you have the ability to pay for your medications:No.  Martie Lee assisted with indigent medications  Interagency Information:     Release of information consent forms completed and in the chart;  Patient's signature needed at discharge.  Patient to Follow up at:  Follow-up Information    [facility] with [facility]. (You are scheduled with)    Contact information:   [address] [place], [place]  [address]  [phone]         Patient denies SI/HI:   Yes,  Patient not endorsing SI    Safety Planning and Suicide Prevention discussed:  Yes,  Reviewed during aftercare group  Barrier to discharge identified:Yes,  Limited support  Summary and Recommendations: Patient encourage to be compliant with medications and follow up with outpatient recommedations    Yanixan Mellinger, Joesph July 05/25/2012, 11:27 AM

## 2012-05-25 NOTE — Tx Team (Signed)
Interdisciplinary Treatment Plan Update (Adult)  Date:  05/25/2012  Time Reviewed:  11:05 AM   Progress in Treatment: Attending groups: Yes Participating in groups:  Yes, very open with her experiences Taking medication as prescribed:  Yes Tolerating medication: Yes Family/Significant othe contact made:  No, patient had no SI and is a reliable reporter, therefore no need for contact  Patient understands diagnosis: Yes Discussing patient identified problems/goals with staff:  Yes, very open with various staff members Medical problems stabilized or resolved: Yes Denies suicidal/homicidal ideation: Yes, has never had SI during this hospitalization or at time of admission Issues/concerns per patient self-inventory:  No  Other:  New problem(s) identified: None  Reason for Continuation of Hospitalization: Appropriate for discharge today  Interventions implemented related to continuation of hospitalization:  Medication stabilization, safety checks q 15 mins, group attendance  Additional comments:  Estimated length of stay: Discharge Today  Discharge Plan: Discharge home and follow up with [facility]  New goal(s):  Review of initial/current patient goals per problem list:   1. Goal(s): Safely complete detox protocol.  Met: Yes Target date: By Discharge  As evidenced by: Cheyenne Fowler has completed detox protocol successfully   2. Goal(s): Reduce depression from 10 at admission to no greater than 3 at discharge.  Met: Yes Target date: By Discharge  As evidenced by: Cheyenne Fowler rates depression at 1 today   3. Goal(s): Reduce hopelessness from 10 at admission to no greater than 3 at discharge.  Met: Yes  Target date: By Discharge  As evidenced by: Rates hopelessness at 1   4. Goal(s): Determine if and how to address substance abuse issues at discharge.  Met: Yes Target date: By Discharge  As evidenced by: Cheyenne Fowler has decided to address substance abuse  issues through outpatient follow-up rather than inpatient reidential treatment - those appointments have been made   5. Goal(s): Return sleep to normal pattern, at least 6+ hours nightly.  Met: Yes Target date: By Discharge  As evidenced by: Cheyenne Fowler reports sleeping 6-7 hours last night   6. Goal(s): Medication stabilization.  Met: Yes Target date: By Discharge  As evidenced [id number] reports medications are working to decrease symptoms with no intolerable side effects    Attendees: Patient:  Cheyenne Fowler 05/25/2012 11:05 AM  Family:     Physician:  Dr Orson Aloe, MD 05/25/2012 11:05 AM  Nursing:   Alease Frame, RN 05/25/2012 11:05 AM  Case Manager:  Juline Patch, LCSW 05/25/2012 11:05 AM  Counselor:  Angus Palms, LCSW 05/25/2012 11:05 AM  Other:  Reyes Ivan, LCSWA 05/25/2012 11:05 AM  Other:  Corinne Ports, doctoral psych intern 05/25/2012 11:05 AM  Other:     Other:      Scribe for Treatment Team:   Billie Lade, 05/25/2012 11:05 AM

## 2012-05-25 NOTE — Discharge Summary (Signed)
I agree with this D/C Summary.  

## 2012-05-25 NOTE — BHH Counselor (Signed)
Missi had no suicidal thoughts or intent on admission to the hospital, nor any during her hospitalization. Thus there was no cause to contact support system for suicide prevention education. However, counselor did provide suicide prevention pamphlet and review contents with Khalaya just to be sure she had all available resources.    Billie Lade 05/25/2012  11:48 AM
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
## 2012-05-25 NOTE — B[facility] Counselor (Signed)
Psychoeducational Group Note  Date:  05/25/2012 Time:  11:00  Group Topic/Focus:  wellness  Participation Level:  Active  Participation Quality:  Appropriate  Affect:  Appropriate  Cognitive:  Appropriate  Insight:  Good  Engagement in Group:  Good  Additional Comments:  Cheyenne Fowler was only in attendance for the first portion of the session. While she was in session, she was engaged and contributed to the conversation about wellness. She reported that she likes to focus on spiritual wellness and going to church.   Christy Sartorius. 05/25/2012, 12:25 PM

## 2012-05-26 NOTE — Progress Notes (Signed)
Patient Discharge Instructions:   No consent Dr. Barrie Folk- [facility]   Wandra Scot, 05/26/2012, 5:51 PM

## 2012-07-11 ENCOUNTER — Emergency Department (HOSPITAL_COMMUNITY)
Admission: EM | Admit: 2012-07-11 | Discharge: 2012-07-11 | Discharge status: Against Medical Advice | Payer: Self-pay | Attending: Emergency Medicine | Admitting: Emergency Medicine

## 2012-07-11 DIAGNOSIS — R51 Headache: Secondary | ICD-10-CM | POA: Insufficient documentation

## 2012-07-11 DIAGNOSIS — IMO0001 Reserved for inherently not codable concepts without codable children: Secondary | ICD-10-CM | POA: Insufficient documentation

## 2012-07-12 DIAGNOSIS — R0789 Other chest pain: Secondary | ICD-10-CM | POA: Diagnosis present

## 2012-07-12 DIAGNOSIS — F3289 Other specified depressive episodes: Secondary | ICD-10-CM | POA: Diagnosis present

## 2012-07-12 DIAGNOSIS — F17200 Nicotine dependence, unspecified, uncomplicated: Secondary | ICD-10-CM | POA: Diagnosis present

## 2012-07-12 DIAGNOSIS — G43909 Migraine, unspecified, not intractable, without status migrainosus: Secondary | ICD-10-CM | POA: Diagnosis present

## 2012-07-12 DIAGNOSIS — J189 Pneumonia, unspecified organism: Principal | ICD-10-CM | POA: Diagnosis present

## 2012-07-12 DIAGNOSIS — R7309 Other abnormal glucose: Secondary | ICD-10-CM | POA: Diagnosis present

## 2012-07-12 DIAGNOSIS — F329 Major depressive disorder, single episode, unspecified: Secondary | ICD-10-CM | POA: Diagnosis present

## 2012-07-12 DIAGNOSIS — E876 Hypokalemia: Secondary | ICD-10-CM | POA: Diagnosis present

## 2012-07-12 MED ORDER — METOCLOPRAMIDE HCL 5 MG/ML IJ SOLN
INTRAMUSCULAR | Status: AC
  Administered 2012-07-13: 10 mg
  Filled 2012-07-12: qty 2

## 2012-07-12 MED ORDER — DIPHENHYDRAMINE HCL 50 MG/ML IJ SOLN
INTRAMUSCULAR | Status: AC
  Administered 2012-07-13: 25 mg via INTRAVENOUS
  Filled 2012-07-12: qty 1

## 2012-07-12 MED ORDER — LEVOFLOXACIN IN D5W 750 MG/150ML IV SOLN
INTRAVENOUS | Status: AC
  Administered 2012-07-13: 750 mg via INTRAVENOUS
  Filled 2012-07-12: qty 150

## 2012-07-12 MED ORDER — DEXAMETHASONE SODIUM PHOSPHATE 10 MG/ML IJ SOLN
INTRAMUSCULAR | Status: AC
  Administered 2012-07-13: 10 mg
  Filled 2012-07-12: qty 1

## 2012-07-13 ENCOUNTER — Encounter (HOSPITAL_COMMUNITY): Payer: Self-pay | Admitting: *Deleted

## 2012-07-13 ENCOUNTER — Emergency Department (HOSPITAL_COMMUNITY): Payer: Self-pay

## 2012-07-13 ENCOUNTER — Inpatient Hospital Stay (HOSPITAL_COMMUNITY)
Admission: EM | Admit: 2012-07-13 | Discharge: 2012-07-16 | DRG: 195 | Disposition: A | Discharge status: Home / Self Care | Payer: Self-pay | Attending: Internal Medicine | Admitting: Internal Medicine

## 2012-07-13 DIAGNOSIS — R739 Hyperglycemia, unspecified: Secondary | ICD-10-CM

## 2012-07-13 DIAGNOSIS — R197 Diarrhea, unspecified: Secondary | ICD-10-CM

## 2012-07-13 DIAGNOSIS — D72829 Elevated white blood cell count, unspecified: Secondary | ICD-10-CM

## 2012-07-13 DIAGNOSIS — E876 Hypokalemia: Secondary | ICD-10-CM

## 2012-07-13 DIAGNOSIS — F329 Major depressive disorder, single episode, unspecified: Secondary | ICD-10-CM

## 2012-07-13 DIAGNOSIS — J189 Pneumonia, unspecified organism: Secondary | ICD-10-CM

## 2012-07-13 DIAGNOSIS — Z720 Tobacco use: Secondary | ICD-10-CM

## 2012-07-13 DIAGNOSIS — F32A Depression, unspecified: Secondary | ICD-10-CM

## 2012-07-13 DIAGNOSIS — R079 Chest pain, unspecified: Secondary | ICD-10-CM

## 2012-07-13 DIAGNOSIS — R7309 Other abnormal glucose: Secondary | ICD-10-CM

## 2012-07-13 LAB — URINE MICROSCOPIC-ADD ON

## 2012-07-13 LAB — URINALYSIS, ROUTINE W REFLEX MICROSCOPIC
Glucose, UA: 250 mg/dL — AB
Ketones, ur: NEGATIVE mg/dL
Nitrite: POSITIVE — AB
Protein, ur: >300 mg/dL — AB
Specific Gravity, Urine: 1.027 (ref 1.005–1.030)
Urobilinogen, UA: 1 mg/dL (ref 0.0–1.0)
pH: 5.5 (ref 5.0–8.0)

## 2012-07-13 LAB — EXPECTORATED SPUTUM ASSESSMENT W GRAM STAIN, RFLX TO RESP C

## 2012-07-13 LAB — CARDIAC PANEL(CRET KIN+CKTOT+MB+TROPI)
CK, MB: 1.8 ng/mL (ref 0.3–4.0)
Relative Index: 0.7 (ref 0.0–2.5)
Total CK: 265 U/L — ABNORMAL HIGH (ref 7–177)
Total CK: 277 U/L — ABNORMAL HIGH (ref 7–177)
Troponin I: <0.3 ng/mL (ref <0.30)

## 2012-07-13 LAB — CBC WITH DIFFERENTIAL/PLATELET
Basophils Absolute: 0 10*3/uL (ref 0.0–0.1)
Basophils Relative: 0 % (ref 0–1)
Eosinophils Absolute: 0 K/uL (ref 0.0–0.7)
Eosinophils Relative: 0 % (ref 0–5)
HCT: 32.1 % — ABNORMAL LOW (ref 36.0–46.0)
Hemoglobin: 10.6 g/dL — ABNORMAL LOW (ref 12.0–15.0)
Lymphocytes Relative: 7 % — ABNORMAL LOW (ref 12–46)
Lymphs Abs: 1.2 K/uL (ref 0.7–4.0)
MCH: 24.6 pg — ABNORMAL LOW (ref 26.0–34.0)
MCHC: 33 g/dL (ref 30.0–36.0)
MCV: 74.5 fL — ABNORMAL LOW (ref 78.0–100.0)
Monocytes Absolute: 1.9 10*3/uL — ABNORMAL HIGH (ref 0.1–1.0)
Monocytes Relative: 10 % (ref 3–12)
Neutro Abs: 15.3 10*3/uL — ABNORMAL HIGH (ref 1.7–7.7)
Neutrophils Relative %: 83 % — ABNORMAL HIGH (ref 43–77)
Platelets: 245 10*3/uL (ref 150–400)
RBC: 4.31 MIL/uL (ref 3.87–5.11)
RDW: 17.3 % — ABNORMAL HIGH (ref 11.5–15.5)
WBC: 18.4 K/uL — ABNORMAL HIGH (ref 4.0–10.5)

## 2012-07-13 LAB — BASIC METABOLIC PANEL WITH GFR
CO2: 19 meq/L (ref 19–32)
GFR calc non Af Amer: 68 mL/min — ABNORMAL LOW (ref >90)
Glucose, Bld: 149 mg/dL — ABNORMAL HIGH (ref 70–99)
Potassium: 2.6 meq/L — CL (ref 3.5–5.1)
Sodium: 131 meq/L — ABNORMAL LOW (ref 135–145)

## 2012-07-13 LAB — STREP PNEUMONIAE URINARY ANTIGEN: Strep Pneumo Urinary Antigen: NEGATIVE

## 2012-07-13 LAB — MAGNESIUM: Magnesium: 1.9 mg/dL (ref 1.5–2.5)

## 2012-07-13 LAB — BASIC METABOLIC PANEL
BUN: 7 mg/dL (ref 6–23)
Calcium: 8.8 mg/dL (ref 8.4–10.5)
Chloride: 95 mEq/L — ABNORMAL LOW (ref 96–112)
Creatinine, Ser: 0.98 mg/dL (ref 0.50–1.10)
GFR calc Af Amer: 79 mL/min — ABNORMAL LOW (ref >90)

## 2012-07-13 LAB — MRSA PCR SCREENING: MRSA by PCR: NEGATIVE

## 2012-07-13 MED ORDER — POTASSIUM CHLORIDE CRYS ER 20 MEQ PO TBCR
40.0000 meq | EXTENDED_RELEASE_TABLET | Freq: Once | ORAL | Status: AC
  Administered 2012-07-13: 40 meq via ORAL
  Filled 2012-07-13: qty 2

## 2012-07-13 MED ORDER — SODIUM CHLORIDE 0.9 % IV SOLN
INTRAVENOUS | Status: DC
  Administered 2012-07-13 – 2012-07-16 (×4): via INTRAVENOUS

## 2012-07-13 MED ORDER — LEVOFLOXACIN IN D5W 750 MG/150ML IV SOLN
750.0000 mg | INTRAVENOUS | Status: DC
  Administered 2012-07-13 – 2012-07-15 (×3): 750 mg via INTRAVENOUS
  Filled 2012-07-13 (×3): qty 150

## 2012-07-13 MED ORDER — NICOTINE 14 MG/24HR TD PT24
14.0000 mg | MEDICATED_PATCH | Freq: Every day | TRANSDERMAL | Status: DC
  Administered 2012-07-13 – 2012-07-16 (×4): 14 mg via TRANSDERMAL
  Filled 2012-07-13 (×4): qty 1

## 2012-07-13 MED ORDER — CARBAMAZEPINE 200 MG PO TABS
200.0000 mg | ORAL_TABLET | Freq: Every day | ORAL | Status: DC
  Administered 2012-07-13 – 2012-07-15 (×3): 200 mg via ORAL
  Filled 2012-07-13 (×4): qty 1

## 2012-07-13 MED ORDER — CITALOPRAM HYDROBROMIDE 20 MG PO TABS
20.0000 mg | ORAL_TABLET | Freq: Every day | ORAL | Status: DC
  Administered 2012-07-13 – 2012-07-16 (×4): 20 mg via ORAL
  Filled 2012-07-13 (×4): qty 1

## 2012-07-13 MED ORDER — POTASSIUM CHLORIDE CRYS ER 20 MEQ PO TBCR
40.0000 meq | EXTENDED_RELEASE_TABLET | ORAL | Status: AC
  Administered 2012-07-13 (×3): 40 meq via ORAL
  Filled 2012-07-13 (×3): qty 2

## 2012-07-13 MED ORDER — ALBUTEROL SULFATE (5 MG/ML) 0.5% IN NEBU: 2.5000 mg | INHALATION_SOLUTION | Freq: Four times a day (QID) | RESPIRATORY_TRACT | Status: DC | PRN

## 2012-07-13 MED ORDER — POTASSIUM CHLORIDE 10 MEQ/100ML IV SOLN
10.0000 meq | Freq: Once | INTRAVENOUS | Status: AC
  Administered 2012-07-13: 10 meq via INTRAVENOUS
  Filled 2012-07-13: qty 200

## 2012-07-13 MED ORDER — PNEUMOCOCCAL VAC POLYVALENT 25 MCG/0.5ML IJ INJ
0.5000 mL | INJECTION | INTRAMUSCULAR | Status: AC
  Filled 2012-07-13: qty 0.5

## 2012-07-13 MED ORDER — ACETAMINOPHEN 325 MG PO TABS
650.0000 mg | ORAL_TABLET | Freq: Four times a day (QID) | ORAL | Status: DC | PRN
  Administered 2012-07-13 – 2012-07-14 (×5): 650 mg via ORAL
  Filled 2012-07-13 (×5): qty 2

## 2012-07-13 MED ORDER — LEVOFLOXACIN IN D5W 750 MG/150ML IV SOLN
750.0000 mg | INTRAVENOUS | Status: DC
  Filled 2012-07-13: qty 150

## 2012-07-13 MED ORDER — GABAPENTIN 100 MG PO CAPS
100.0000 mg | ORAL_CAPSULE | Freq: Three times a day (TID) | ORAL | Status: DC
  Administered 2012-07-13 – 2012-07-16 (×9): 100 mg via ORAL
  Filled 2012-07-13 (×11): qty 1

## 2012-07-13 MED ORDER — AMITRIPTYLINE HCL 25 MG PO TABS
25.0000 mg | ORAL_TABLET | Freq: Every day | ORAL | Status: DC
  Administered 2012-07-13 – 2012-07-15 (×3): 25 mg via ORAL
  Filled 2012-07-13 (×4): qty 1

## 2012-07-13 MED FILL — Diphenhydramine HCl Inj 50 MG/ML: INTRAMUSCULAR | Qty: 0.5 | Status: AC

## 2012-07-13 MED FILL — Dexamethasone Sodium Phosphate Inj 10 MG/ML: INTRAMUSCULAR | Qty: 1 | Status: AC

## 2012-07-13 MED FILL — Metoclopramide HCl Inj 5 MG/ML (Base Equivalent): INTRAMUSCULAR | Qty: 2 | Status: AC

## 2012-07-13 MED FILL — Levofloxacin in D5W IV Soln 750 MG/150ML: INTRAVENOUS | Qty: 150 | Status: AC

## 2012-07-13 MED FILL — Acetaminophen Tab 325 MG: ORAL | Qty: 2 | Status: AC

## 2012-07-13 NOTE — ED Provider Notes (Signed)
History     CSN: [id number]  Arrival date & time 07/12/12  1658   None     No chief complaint on file.   (Consider location/radiation/quality/duration/timing/severity/associated sxs/prior treatment) HPI HX per PT.  F/C x 3 days with productive cough and SOB. No PCP, is a smoker. No ABD pain or dysuria. No CP or back pain. Worse over the last 24 hours. Mod in severity. polysub abuse. No agg or alleviating factors.  Past Medical History  Diagnosis Date  . Heart murmur   . Allergy     Past Surgical History  Procedure Date  . Hernia repair   . Cesarean section   . Tubal ligation     Family History  Problem Relation Age of Onset  . Diabetes Mother   . Bipolar disorder Sister   . Schizophrenia Brother   . Cancer Maternal Grandfather     History  Substance Use Topics  . Smoking status: Current Everyday Smoker -- 0.5 packs/day    Types: Cigarettes  . Smokeless tobacco: Never Used  . Alcohol Use: Yes     120 oz per wk    OB History    Grav Para Term Preterm Abortions TAB SAB Ect Mult Living   3 3 3   0     3      Review of Systems  Constitutional: Positive for chills and fatigue. Negative for fever.  HENT: Negative for neck pain and neck stiffness.   Eyes: Negative for pain.  Respiratory: Positive for cough and shortness of breath.   Cardiovascular: Negative for chest pain.  Gastrointestinal: Negative for abdominal pain.  Genitourinary: Negative for dysuria.  Musculoskeletal: Negative for back pain.  Skin: Negative for rash.  Neurological: Negative for headaches.  All other systems reviewed and are negative.    Allergies  Penicillins and Sulfonamide derivatives  Home Medications   Current Outpatient Rx  Name Route Sig Dispense Refill  . AMITRIPTYLINE HCL 25 MG PO TABS Oral Take 1 tablet (25 mg total) by mouth at bedtime. For pain control/sleep/depression 30 tablet 0  . CARBAMAZEPINE 200 MG PO TABS Oral Take 1 tablet (200 mg total) by mouth daily at 8  pm. For mood control 30 tablet 0  . CITALOPRAM HYDROBROMIDE 20 MG PO TABS Oral Take 1 tablet (20 mg total) by mouth daily. For depression 30 tablet 0  . DIPHENHYDRAMINE HCL (SLEEP) 25 MG PO TABS Oral Take 1 tablet (25 mg total) by mouth at bedtime as needed for allergies (Allergies). For allergies 30 tablet 0  . GABAPENTIN 100 MG PO CAPS Oral Take 1 capsule (100 mg total) by mouth 3 (three) times daily. For anxiety/pain 90 capsule 0    There were no vitals taken for this visit.  Physical Exam  Constitutional: She is oriented to person, place, and time. She appears well-developed and well-nourished.  HENT:  Head: Normocephalic and atraumatic.  Eyes: Conjunctivae and EOM are normal. Pupils are equal, round, and reactive to light.  Neck: Trachea normal. Neck supple. No thyromegaly present.  Cardiovascular: Normal rate, regular rhythm, S1 normal, S2 normal and normal pulses.     No systolic murmur is present   No diastolic murmur is present  Pulses:      Radial pulses are 2+ on the right side, and 2+ on the left side.  Pulmonary/Chest: She has no rhonchi.       Dec breath sounds R side with mild tachypnea, no resp distress.   Abdominal: Soft. Normal appearance and  bowel sounds are normal. There is no tenderness. There is no CVA tenderness and negative Murphy's sign.  Musculoskeletal:       BLE:s Calves nontender, no cords or erythema, negative Homans sign  Neurological: She is alert and oriented to person, place, and time. She has normal strength. No cranial nerve deficit or sensory deficit. GCS eye subscore is 4. GCS verbal subscore is 5. GCS motor subscore is 6.  Skin: Skin is warm and dry. No rash noted. She is not diaphoretic.  Psychiatric: Her speech is normal.       Cooperative and appropriate    ED Course  Procedures (including critical care time)  Results for orders placed during the hospital encounter of 07/13/12  BASIC METABOLIC PANEL      Component Value Range   Sodium 131  (*) 135 - 145 mEq/L   Potassium 2.6 (*) 3.5 - 5.1 mEq/L   Chloride 95 (*) 96 - 112 mEq/L   CO2 19  19 - 32 mEq/L   Glucose, Bld 149 (*) 70 - 99 mg/dL   BUN 7  6 - 23 mg/dL   Creatinine, Ser 1.61  0.50 - 1.10 mg/dL   Calcium 8.8  8.4 - 09.6 mg/dL   GFR calc non Af Amer 68 (*) >90 mL/min   GFR calc Af Amer 79 (*) >90 mL/min  CBC WITH DIFFERENTIAL      Component Value Range   WBC 7.6  4.0 - 10.5 K/uL   RBC 4.76  3.87 - 5.11 MIL/uL   Hemoglobin 15.1 (*) 12.0 - 15.0 g/dL   HCT 04.5  40.9 - 81.1 %   MCV 89.9  78.0 - 100.0 fL   MCH 31.7  26.0 - 34.0 pg   MCHC 35.3  30.0 - 36.0 g/dL   RDW 91.4  78.2 - 95.6 %   Platelets 257  150 - 400 K/uL   Neutrophils Relative 63  43 - 77 %   Neutro Abs 4.8  1.7 - 7.7 K/uL   Lymphocytes Relative 23  12 - 46 %   Lymphs Abs 1.8  0.7 - 4.0 K/uL   Monocytes Relative 12  3 - 12 %   Monocytes Absolute 0.9  0.1 - 1.0 K/uL   Eosinophils Relative 2  0 - 5 %   Eosinophils Absolute 0.1  0.0 - 0.7 K/uL   Basophils Relative 0  0 - 1 %   Basophils Absolute 0.0  0.0 - 0.1 K/uL    IVFs, oxygen, labs and CXR obtained.   MED c/s for admit CAP. IV Levaquin initiated.  D/w [facility] hospitalist for admit PNA  MDM   CXR rreviewed has dense RUL PNA. Potassium for hypokalemia. Labs reviewed. MED admit,.         Sunnie Nielsen, MD 07/13/12 [id number]

## 2012-07-13 NOTE — Progress Notes (Signed)
  Echocardiogram 2D Echocardiogram has been performed.  Sante Biedermann 07/13/2012, 1:20 PM

## 2012-07-13 NOTE — Progress Notes (Signed)
[facility] PROGRESS NOTE  Cheyenne Fowler ION:[id number] DOB: May 08, 1966 DOA: 07/13/2012 PCP: No primary provider on file.  Assessment/Plan: 1-Community acquired pneumonia: will continue IV levaquin; PRN BD; PRN antipyretics and supportive care. Will also start incentive spirometry.  2-Chest pain: atypical and most likely associated to cough. Will watch on telemetry for 24 hours and cycle cardiac enzymes.  3-Depression:continue home regimen (Celexa)  4-Hypokalemia: will replete and check magnesium level.  5-Tobacco abuse: counseling provided and nicotine patch provided.  6-Leukocytosis: due to PNA. Continue antibiotics and follow CBC in am.  7-Hyperglycemia: most likely due to steroid treatment given for headache in ED. Will follow fasting CBG and if elevated will check A1C. No history of diabetes.  8-Migraines: will continue tegretol and neurontin. Also PRN tylenol.   Code Status: Full Family Communication: no family at bedside; but patient was competent and plan of care discussed with her; all questions answered. Disposition Plan: home when medically stable   Brief narrative: 46 y/o female with hx of depression, tobacco abuse and migraines; came to the hospital complaining of F/C x 3 days with productive cough and SOB. Denies abdominal pain pain, N/V or dysuria. Patient endorses decrease appetite and associated CP with her cough.   Consultants:  None  Procedures:  None  Antibiotics:  Levaquin  HPI/Subjective: Complaining of pleuritic chest pain due to cough and also headache. Reports improvement in her breathing.  Objective: Filed Vitals:   07/13/12 0614 07/13/12 0900 07/13/12 0944  BP: 99/63 94/57 101/67  Pulse: 77 78 76  Temp: 99.3 F (37.4 C) 99.2 F (37.3 C) 98.7 F (37.1 C)  TempSrc: Oral Oral Oral  Resp: 20 18 19   SpO2: 100% 100% 99%   No intake or output data in the 24 hours ending 07/13/12 1305  Exam:   General:  NAD  Cardiovascular:  RRR, no rubs or gallops  Respiratory: Diffuse rhonchi and decreased BS bilaterally; no wheezing  Abdomen: Soft; NT; positive BS  MSK: no joint swelling or erythema  Neurologic: no focal motor or sensory deficit.  Data Reviewed: Basic Metabolic Panel:  Lab 07/12/12 2440  NA 131*  K 2.6*  CL 95*  CO2 19  GLUCOSE 149*  BUN 7  CREATININE 0.98  CALCIUM 8.8  MG --  PHOS --   CBC:  Lab 07/12/12 2150  WBC 18.4*  NEUTROABS 15.3*  HGB 10.6*  HCT 32.1*  MCV 74.5*  PLT 245   Studies: No results found.  Scheduled Meds:   . amitriptyline  25 mg Oral QHS  . carbamazepine  200 mg Oral Q2000  . citalopram  20 mg Oral Daily  . dexamethasone      . diphenhydrAMINE      . gabapentin  100 mg Oral TID  . levofloxacin      . levofloxacin (LEVAQUIN) IV  750 mg Intravenous Q24H  . metoCLOPramide      . pneumococcal 23 valent vaccine  0.5 mL Intramuscular Tomorrow-1000  . potassium chloride  10 mEq Intravenous Once  . potassium chloride  40 mEq Oral Once  . potassium chloride  40 mEq Oral Q4H   Continuous Infusions:   . sodium chloride 75 mL/hr at 07/13/12 0700    Time spent: >30 minutes    Cheyenne Fowler  [facility] Pager [phone]. If 8PM-8AM, please contact night-coverage at [email or web address], password [facility] 07/13/2012, 1:05 PM  LOS: 0 days

## 2012-07-14 DIAGNOSIS — R197 Diarrhea, unspecified: Secondary | ICD-10-CM | POA: Diagnosis present

## 2012-07-14 DIAGNOSIS — E876 Hypokalemia: Secondary | ICD-10-CM

## 2012-07-14 DIAGNOSIS — D72829 Elevated white blood cell count, unspecified: Secondary | ICD-10-CM

## 2012-07-14 DIAGNOSIS — R7309 Other abnormal glucose: Secondary | ICD-10-CM

## 2012-07-14 LAB — BASIC METABOLIC PANEL
Calcium: 7.8 mg/dL — ABNORMAL LOW (ref 8.4–10.5)
Chloride: 106 mEq/L (ref 96–112)
Creatinine, Ser: 0.74 mg/dL (ref 0.50–1.10)
GFR calc Af Amer: >90 mL/min (ref >90)
Sodium: 136 mEq/L (ref 135–145)

## 2012-07-14 LAB — URINE CULTURE: Colony Count: >=100000

## 2012-07-14 LAB — LEGIONELLA ANTIGEN, URINE: Legionella Antigen, Urine: NEGATIVE

## 2012-07-14 LAB — CBC
MCV: 74.4 fL — ABNORMAL LOW (ref 78.0–100.0)
Platelets: 250 10*3/uL (ref 150–400)
RDW: 17.9 % — ABNORMAL HIGH (ref 11.5–15.5)
WBC: 12 10*3/uL — ABNORMAL HIGH (ref 4.0–10.5)

## 2012-07-14 LAB — CARDIAC PANEL(CRET KIN+CKTOT+MB+TROPI): Relative Index: 0.9 (ref 0.0–2.5)

## 2012-07-14 LAB — HEMOGLOBIN A1C: Mean Plasma Glucose: 137 mg/dL — ABNORMAL HIGH (ref <117)

## 2012-07-14 MED ORDER — SACCHAROMYCES BOULARDII 250 MG PO CAPS
250.0000 mg | ORAL_CAPSULE | Freq: Two times a day (BID) | ORAL | Status: DC
  Administered 2012-07-14 – 2012-07-16 (×5): 250 mg via ORAL
  Filled 2012-07-14 (×6): qty 1

## 2012-07-14 MED ORDER — ONDANSETRON HCL 4 MG/2ML IJ SOLN
4.0000 mg | Freq: Four times a day (QID) | INTRAMUSCULAR | Status: DC | PRN
  Administered 2012-07-14: 4 mg via INTRAVENOUS
  Filled 2012-07-14: qty 2

## 2012-07-14 MED ORDER — SODIUM CHLORIDE 0.9 % IV SOLN
1.0000 g | Freq: Once | INTRAVENOUS | Status: AC
  Administered 2012-07-14: 1 g via INTRAVENOUS
  Filled 2012-07-14: qty 10

## 2012-07-14 MED ORDER — LOPERAMIDE HCL 2 MG PO CAPS: 2.0000 mg | ORAL_CAPSULE | ORAL | Status: DC | PRN

## 2012-07-14 MED ORDER — ENOXAPARIN SODIUM 40 MG/0.4ML ~~LOC~~ SOLN
40.0000 mg | SUBCUTANEOUS | Status: DC
  Administered 2012-07-14 – 2012-07-15 (×2): 40 mg via SUBCUTANEOUS
  Filled 2012-07-14 (×3): qty 0.4

## 2012-07-14 NOTE — Progress Notes (Signed)
[facility] PROGRESS NOTE  LAURYNN MCCORVEY ZOX:[id number] DOB: Dec 14, 1966 DOA: 07/13/2012 PCP: No primary provider on file.  Assessment/Plan: 1-Community acquired pneumonia: will continue IV levaquin; PRN BD; PRN antipyretics and supportive care. Will also continue incentive spirometry. Patient better; but still with mild SOB and low grade fever. Probably home in 1-2 days.  2-Chest pain: atypical and most likely associated to cough. No telemetry ischemic changes appreciated; norma CE'z and no wall motion abnormalities on 2-D echo.  3-Depression:continue home regimen (Celexa); no SI or hallucinations  4-Hypokalemia: Repleted.  5-Tobacco abuse: counseling provided and nicotine patch provided.  6-Leukocytosis: due to PNA. Continue antibiotics and follow WBC's trend. Today leukocytes down to 12.5  7-Hyperglycemia: most likely due to steroid treatment given for headache in ED. CBG 108; mildly elevated. Will check A1C; but patient most likely into the category of glucose intolerance.  8-Migraines: will continue tegretol and neurontin. Also PRN tylenol. No Ha's this morning  9-Heart murmur: due to mitral prolapse; otherwise 2-D echo demonstrated no significant abnormalities.  10-Diarrhea: due to antibiotics; C. Diff negative. Will treat symptomatically with PRN Lomotil and florastor.   Code Status: Full Family Communication: no family at bedside; but patient was competent and plan of care discussed with her; all questions answered. Disposition Plan: home when medically stable   Brief narrative: 46 y/o female with hx of depression, tobacco abuse and migraines; came to the hospital complaining of F/C x 3 days with productive cough and SOB. Denies abdominal pain pain, N/V or dysuria. Patient endorses decrease appetite and associated CP with her cough.   Consultants:  None  Procedures: 2-D echo - Left ventricle: The cavity size was normal. Systolic function was normal. The  estimated ejection fraction was in the range of 55% to 60%. Wall motion was normal; there were no regional wall motion abnormalities. Left ventricular diastolic function parameters were normal. - Aortic valve: Trivial regurgitation. - Mitral valve: Moderate regurgitation. - Left atrium: The atrium was mildly dilated.   Antibiotics:  Levaquin  HPI/Subjective: Complaining of diarrhea; chills and abdominal cramps from menstrual period. Low grade fever present today; cough and SOB better.   Objective: Filed Vitals:   07/13/12 1431 07/13/12 2258 07/14/12 0617 07/14/12 0731  BP:  100/68 103/69 116/73  Pulse:  72 82 82  Temp:  98.1 F (36.7 C) 99.5 F (37.5 C) 100.8 F (38.2 C)  TempSrc:  Oral Oral Oral  Resp:  20 20 24   Height: 5\' 4"  (1.626 m)     Weight: 73.936 kg (163 lb)     SpO2:  100% 96%     Intake/Output Summary (Last 24 hours) at 07/14/12 0913 Last data filed at 07/13/12 2259  Gross per 24 hour  Intake    240 ml  Output      0 ml  Net    240 ml    Exam:   General:  NAD  Cardiovascular: RRR, no rubs or gallops  Respiratory: Diffuse rhonchi and decreased BS bilaterally; no wheezing  Abdomen: Soft; NT; positive BS  MSK: no joint swelling or erythema  Neurologic: no focal motor or sensory deficit.  Data Reviewed: Basic Metabolic Panel:  Lab 07/14/12 8119 07/13/12 1300 07/12/12 2150  NA 136 -- 131*  K 4.0 -- 2.6*  CL 106 -- 95*  CO2 21 -- 19  GLUCOSE 108* -- 149*  BUN 10 -- 7  CREATININE 0.74 -- 0.98  CALCIUM 7.8* -- 8.8  MG -- 1.9 --  PHOS -- -- --  CBC:  Lab 07/14/12 0430 07/12/12 2150  WBC 12.0* 18.4*  NEUTROABS -- 15.3*  HGB 10.0* 10.6*  HCT 29.9* 32.1*  MCV 74.4* 74.5*  PLT 250 245   Studies: No results found.  Scheduled Meds:    . amitriptyline  25 mg Oral QHS  . calcium gluconate  1 g Intravenous Once  . carbamazepine  200 mg Oral Q2000  . citalopram  20 mg Oral Daily  . gabapentin  100 mg Oral TID  . levofloxacin  (LEVAQUIN) IV  750 mg Intravenous Q24H  . nicotine  14 mg Transdermal Daily  . pneumococcal 23 valent vaccine  0.5 mL Intramuscular Tomorrow-1000  . potassium chloride  40 mEq Oral Q4H  . DISCONTD: levofloxacin (LEVAQUIN) IV  750 mg Intravenous Q24H   Continuous Infusions:    . sodium chloride 75 mL/hr at 07/13/12 0700    Time spent: >30 minutes    Rajah Lamba  [facility] Pager [phone]. If 8PM-8AM, please contact night-coverage at [email or web address], password [facility] 07/14/2012, 9:13 AM  LOS: 1 day

## 2012-07-15 LAB — BASIC METABOLIC PANEL
BUN: 5 mg/dL — ABNORMAL LOW (ref 6–23)
CO2: 21 mEq/L (ref 19–32)
Chloride: 104 mEq/L (ref 96–112)
Creatinine, Ser: 0.9 mg/dL (ref 0.50–1.10)
GFR calc Af Amer: 88 mL/min — ABNORMAL LOW (ref >90)
Potassium: 3.9 mEq/L (ref 3.5–5.1)

## 2012-07-15 LAB — CBC
HCT: 30.4 % — ABNORMAL LOW (ref 36.0–46.0)
MCV: 74.1 fL — ABNORMAL LOW (ref 78.0–100.0)
Platelets: 293 10*3/uL (ref 150–400)
RBC: 4.1 MIL/uL (ref 3.87–5.11)
RDW: 18 % — ABNORMAL HIGH (ref 11.5–15.5)
WBC: 9.3 10*3/uL (ref 4.0–10.5)

## 2012-07-15 MED ORDER — LEVOFLOXACIN 750 MG PO TABS: 750.0000 mg | ORAL_TABLET | Freq: Every day | ORAL | Status: AC

## 2012-07-15 NOTE — Discharge Summary (Signed)
Physician Discharge Summary  Cheyenne Fowler ZOX:[id number] DOB: 09-03-1966 DOA: 07/13/2012  PCP: No primary provider on file.  Admit date: 07/13/2012 Discharge date: 07/15/2012  Recommendations for Outpatient Follow-up:  1. Followup with primary care physician.  Discharge Diagnoses:  Principal Problem:  *Community acquired pneumonia Active Problems:  Chest pain  Depression  Hypokalemia  Tobacco abuse  Leukocytosis  Hyperglycemia  Diarrhea   1. Community acquired pneumonia  Discharge Condition: Stable  Diet recommendation: Regular diet  History of present illness:  46 y/o female with hx of depression, tobacco abuse and migraines; came to the hospital complaining of F/C x 3 days with productive cough and SOB. Denies abdominal pain pain, N/V or dysuria. Patient endorses decrease appetite and associated CP with her cough.   Hospital Course:    1. Community-acquired pneumonia: Patient given to the hospital complaining about fever, chills, productive cough and shortness of breath. Chest x-ray showed dense right upper lobe opacities consistent with community-acquired pneumonia. Patient started on IV Levaquin, patient was feeling much better the very next day. But she was still febrile. On the day of discharge she had fever last night and asked her to stay till she defervesces, but she wanted to be discharged home. Patient discharged on Levaquin to complete 7 days of oral antibiotics. Instructed to take over-the-counter mucolytic.  2. Chest pain: Acute coronary syndrome was ruled out by 3 sets of cardiac enzymes, 2-D echocardiogram showed normal wall motion. The chest pain is likely pleuritic chest pain secondary to her pneumonia. This is resolved at the time of discharge.  3. Hypokalemia: This is being repleted with oral supplementation.  4. Depression: This is stable chronic condition, her preadmission medications were continued throughout the hospital stay.  Procedures:  2-D  echocardiogram showed LVEF of 55-60%, normal wall motion.  Consultations:  None  Discharge Exam: Filed Vitals:   07/15/12 1456  BP: 99/64  Pulse: 68  Temp: 98.5 F (36.9 C)  Resp: 18   Filed Vitals:   07/15/12 0500 07/15/12 0513 07/15/12 0612 07/15/12 1456  BP:   99/62 99/64  Pulse:   85 68  Temp:  100.5 F (38.1 C) 99.2 F (37.3 C) 98.5 F (36.9 C)  TempSrc:  Oral Oral Oral  Resp:   20 18  Height:      Weight: 74.9 kg (165 lb 2 oz)     SpO2:   92% 98%   General: Alert and awake, oriented x3, not in any acute distress. HEENT: anicteric sclera, pupils reactive to light and accommodation, EOMI CVS: S1-S2 clear, no murmur rubs or gallops Chest: clear to auscultation bilaterally, no wheezing, rales or rhonchi Abdomen: soft nontender, nondistended, normal bowel sounds, no organomegaly Extremities: no cyanosis, clubbing or edema noted bilaterally Neuro: Cranial nerves II-XII intact, no focal neurological deficits   Discharge Instructions   Medication List  As of 07/15/2012  4:02 PM   TAKE these medications         amitriptyline 25 MG tablet   Commonly known as: ELAVIL   Take 1 tablet (25 mg total) by mouth at bedtime. For pain control/sleep/depression      carbamazepine 200 MG tablet   Commonly known as: TEGRETOL   Take 1 tablet (200 mg total) by mouth daily at 8 pm. For mood control      citalopram 20 MG tablet   Commonly known as: CELEXA   Take 1 tablet (20 mg total) by mouth daily. For depression      diphenhydrAMINE 25  MG tablet   Commonly known as: SOMINEX   Take 1 tablet (25 mg total) by mouth at bedtime as needed for allergies (Allergies). For allergies      gabapentin 100 MG capsule   Commonly known as: NEURONTIN   Take 1 capsule (100 mg total) by mouth 3 (three) times daily. For anxiety/pain      levofloxacin 750 MG tablet   Commonly known as: LEVAQUIN   Take 1 tablet (750 mg total) by mouth daily.              The results of significant  diagnostics from this hospitalization (including imaging, microbiology, ancillary and laboratory) are listed below for reference.    Significant Diagnostic Studies: Dg Chest 2 View  07/13/2012  *RADIOLOGY REPORT*  Clinical Data:  Shortness of breath and fever.  CHEST - 2 VIEW  Comparison: None  Findings:  There is a dense right upper lobe pneumonia present with air bronchograms.  No edema or associated pleural fluid.  Cardiac and mediastinal contours are within normal limits.  Bony thorax is unremarkable.  IMPRESSION: Dense right upper lobe pneumonia.  Original Report Authenticated By: Reola Calkins, M.D.    Microbiology: Recent Results (from the past 240 hour(s))  URINE CULTURE     Status: Normal   Collection Time   07/12/12 10:31 PM      Component Value Range Status Comment   Specimen Description URINE, RANDOM   Final    Special Requests NONE   Final    Culture  Setup Time 07/13/2012 10:21   Final    Colony Count >=100,000 COLONIES/ML   Final    Culture KLEBSIELLA PNEUMONIAE   Final    Report Status 07/14/2012 FINAL   Final    Organism ID, Bacteria KLEBSIELLA PNEUMONIAE   Final   CULTURE, BLOOD (ROUTINE X 2)     Status: Normal (Preliminary result)   Collection Time   07/12/12 10:34 PM      Component Value Range Status Comment   Specimen Description BLOOD LEFT ANTECUBITAL   Final    Special Requests BOTTLES DRAWN AEROBIC AND ANAEROBIC 5CC   Final    Culture  Setup Time 07/13/2012 09:35   Final    Culture     Final    Value:        BLOOD CULTURE RECEIVED NO GROWTH TO DATE CULTURE WILL BE HELD FOR 5 DAYS BEFORE ISSUING A FINAL NEGATIVE REPORT   Report Status PENDING   Incomplete   CULTURE, BLOOD (ROUTINE X 2)     Status: Normal (Preliminary result)   Collection Time   07/12/12 10:34 PM      Component Value Range Status Comment   Specimen Description BLOOD RIGHT ARM   Final    Special Requests BOTTLES DRAWN AEROBIC AND ANAEROBIC 3CC   Final    Culture  Setup Time 07/13/2012 09:35    Final    Culture     Final    Value:        BLOOD CULTURE RECEIVED NO GROWTH TO DATE CULTURE WILL BE HELD FOR 5 DAYS BEFORE ISSUING A FINAL NEGATIVE REPORT   Report Status PENDING   Incomplete   MRSA PCR SCREENING     Status: Normal   Collection Time   07/13/12 12:05 PM      Component Value Range Status Comment   MRSA by PCR NEGATIVE  NEGATIVE Final   CULTURE, EXPECTORATED SPUTUM-ASSESSMENT     Status: Normal   Collection  Time   07/13/12  4:31 PM      Component Value Range Status Comment   Specimen Description SPUTUM   Final    Special Requests NONE   Final    Sputum evaluation     Final    Value: THIS SPECIMEN IS ACCEPTABLE. RESPIRATORY CULTURE REPORT TO FOLLOW.   Report Status 07/13/2012 FINAL   Final   CULTURE, RESPIRATORY     Status: Normal (Preliminary result)   Collection Time   07/13/12  4:31 PM      Component Value Range Status Comment   Specimen Description SPUTUM   Final    Special Requests NONE   Final    Gram Stain     Final    Value: NO WBC SEEN     NO SQUAMOUS EPITHELIAL CELLS SEEN     RARE GRAM POSITIVE COCCI IN PAIRS     RARE GRAM NEGATIVE RODS   Culture Culture reincubated for better growth   Final    Report Status PENDING   Incomplete   CLOSTRIDIUM DIFFICILE BY PCR     Status: Normal   Collection Time   07/13/12  6:36 PM      Component Value Range Status Comment   C difficile by pcr NEGATIVE  NEGATIVE Final      Labs: Basic Metabolic Panel:  Lab 07/15/12 1610 07/14/12 0430 07/13/12 1300 07/12/12 2150  NA 134* 136 -- 131*  K 3.9 4.0 -- 2.6*  CL 104 106 -- 95*  CO2 21 21 -- 19  GLUCOSE 98 108* -- 149*  BUN 5* 10 -- 7  CREATININE 0.90 0.74 -- 0.98  CALCIUM 8.2* 7.8* -- 8.8  MG -- -- 1.9 --  PHOS -- -- -- --   Liver Function Tests: No results found for this basename: AST:5,ALT:5,ALKPHOS:5,BILITOT:5,PROT:5,ALBUMIN:5 in the last 168 hours No results found for this basename: LIPASE:5,AMYLASE:5 in the last 168 hours No results found for this basename:  AMMONIA:5 in the last 168 hours CBC:  Lab 07/15/12 0408 07/14/12 0430 07/12/12 2150  WBC 9.3 12.0* 18.4*  NEUTROABS -- -- 15.3*  HGB 9.8* 10.0* 10.6*  HCT 30.4* 29.9* 32.1*  MCV 74.1* 74.4* 74.5*  PLT 293 250 245   Cardiac Enzymes:  Lab 07/13/12 2355 07/13/12 1830 07/13/12 1240  CKTOTAL 267* 277* 265*  CKMB 2.5 2.2 1.8  CKMBINDEX -- -- --  TROPONINI <0.30 <0.30 <0.30   BNP: BNP (last 3 results) No results found for this basename: PROBNP:3 in the last 8760 hours CBG: No results found for this basename: GLUCAP:5 in the last 168 hours  Time coordinating discharge: 40 minutes Signed:  Mariajose Mow A  [facility] 07/15/2012, 4:02 PM

## 2012-07-15 NOTE — Progress Notes (Signed)
Talked to patient about follow up medical care; Patient is independent prior to admission, works in Aeronautical [employer]; Information given to patient on the [facility] as a possible health care provider, also gave patient information on [facility], [facility], [facility], [facility]- patient will decided which provider to go to; Patient qualifies for the Indigent funds if needed at discharge. B Amma Crear RN,BSN,MHA.

## 2012-07-16 LAB — CULTURE, RESPIRATORY W GRAM STAIN
Culture: NORMAL
Gram Stain: NONE SEEN

## 2012-07-16 NOTE — Progress Notes (Signed)
   Subjective: Has less productive cough, denies any shortness of breath.  Objective:  General: Alert and awake, oriented x3, not in any acute distress. HEENT: anicteric sclera, pupils reactive to light and accommodation, EOMI CVS: S1-S2 clear, no murmur rubs or gallops Chest: clear to auscultation bilaterally, no wheezing, rales or rhonchi Abdomen: soft nontender, nondistended, normal bowel sounds, no organomegaly Extremities: no cyanosis, clubbing or edema noted bilaterally Neuro: Cranial nerves II-XII intact, no focal neurological deficits   Assessment and plan  Pneumonia: Stable, patient to complete antibiotics as outpatient. Levaquin.  Chest pain: Resolved, this is pleuritic chest pain.  Hypokalemia: Replete with oral supplements.  Impression: This is stable chronic condition, no changes done to her medications.  Clint Lipps Pager: [phone] 07/16/2012, 2:44 PM

## 2012-07-19 LAB — CULTURE, BLOOD (ROUTINE X 2)
Culture: NO GROWTH
Culture: NO GROWTH

## 2014-10-24 ENCOUNTER — Encounter (HOSPITAL_COMMUNITY): Payer: Self-pay | Admitting: *Deleted

## 2016-02-12 ENCOUNTER — Other Ambulatory Visit (HOSPITAL_COMMUNITY): Payer: Self-pay | Admitting: *Deleted

## 2016-02-12 DIAGNOSIS — N644 Mastodynia: Secondary | ICD-10-CM

## 2016-02-12 DIAGNOSIS — N6320 Unspecified lump in the left breast, unspecified quadrant: Secondary | ICD-10-CM

## 2016-02-15 ENCOUNTER — Encounter (HOSPITAL_COMMUNITY): Payer: Self-pay

## 2016-02-15 ENCOUNTER — Ambulatory Visit
Admission: RE | Admit: 2016-02-15 | Discharge: 2016-02-15 | Disposition: A | Discharge status: Home / Self Care | Payer: No Typology Code available for payment source | Source: Ambulatory Visit | Attending: Obstetrics and Gynecology | Admitting: Obstetrics and Gynecology

## 2016-02-15 ENCOUNTER — Ambulatory Visit (HOSPITAL_COMMUNITY)
Admission: RE | Admit: 2016-02-15 | Discharge: 2016-02-15 | Disposition: A | Discharge status: Home / Self Care | Payer: Self-pay | Source: Ambulatory Visit | Attending: Obstetrics and Gynecology | Admitting: Obstetrics and Gynecology

## 2016-02-15 VITALS — BP 112/74 | Temp 97.6°F | Ht 63.0 in | Wt 197.0 lb

## 2016-02-15 DIAGNOSIS — N644 Mastodynia: Secondary | ICD-10-CM

## 2016-02-15 DIAGNOSIS — Z01419 Encounter for gynecological examination (general) (routine) without abnormal findings: Secondary | ICD-10-CM

## 2016-02-15 DIAGNOSIS — N6320 Unspecified lump in the left breast, unspecified quadrant: Secondary | ICD-10-CM

## 2016-02-15 HISTORY — DX: Anemia, unspecified: D64.9

## 2016-02-15 NOTE — Patient Instructions (Addendum)
Educational materials on self breast awareness given. Explained to Cheyenne Fowler that [facility] will cover Pap smears and HPV typing every 5 years unless has a history of abnormal Pap smears. Referred patient to the [facility] for diagnostic mammogram. Appointment scheduled for Thursday, February 15, 2016 at 1030. Let patient know will follow up with her within the next couple weeks with results of Pap smear by phone. Smoking cessation discussed with patient. Referred patient to the [facility] Quitline and gave resources to the free smoking cessation classes offered at the [facility]. Patient referred to the [facility] program for health assessment, labs, health coaching, and to be referred to PCP. Appointment is scheduled for Thursday, February 29, 2016 at 1000. Patient aware of appointments and will be there.  Cheyenne Fowler verbalized understanding.  Cheyenne Fowler, Cheyenne [place], RN 9:02 AM

## 2016-02-15 NOTE — Progress Notes (Addendum)
Patient complained of bilateral breast pain x 2 weeks that comes and goes. Patient rated the pain at a 3 out of 10. Patient complained of a left breast lump x 1 month. Patient's last screening mammogram was 04/14/2012 and additional imaging of the left breast was recommended. Patient never had the additional imaging completed.   Pap Smear: Pap smear completed today. Last Pap smear was 03/18/2012 at the [facility] and normal. Per patient has no history of an abnormal Pap smear. Last Pap smear result is in EPIC.  Physical exam: Breasts Breasts symmetrical. No skin abnormalities bilateral breasts. No nipple retraction bilateral breasts. No nipple discharge bilateral breasts. No lymphadenopathy. No lumps palpated bilateral breasts. No lumps palpated in patients area of concern. Complaints of right breast pain breast at 6 o'clock and 9 o'clock on exam. Complaints of left breast pain at 3 o'clock, 6 o'clock, and 9 o'clock on exam. Referred patient to the [facility] for diagnostic mammogram. Appointment scheduled for Thursday, February 15, 2016 at 1030.  Pelvic/Bimanual   Ext Genitalia No lesions, no swelling and no discharge observed on external genitalia.         Vagina Vagina pink and normal texture. No lesions or discharge observed in vagina.          Cervix Cervix is present. Cervix pink and of normal texture. No discharge observed.     Uterus Uterus is present and palpable. Uterus in normal position and normal size.        Adnexae Bilateral ovaries present and palpable. No tenderness on palpation.          Rectovaginal No rectal exam completed today since patient had no rectal complaints. No skin abnormalities observed on exam.    Smoking History: Patient currently smokes. Smoking cessation discussed with patient. Referred patient to the [facility] Quitline and gave resources to the free smoking cessation classes offered at the [facility].  Patient  Navigation: Patient education provided. Access to services provided for patient through [facility] program. Patient referred to the [facility] program for health assessment, labs, health coaching, and to be referred to PCP. Appointment is scheduled for Thursday, February 29, 2016 at 1000.  Colorectal Cancer Screening: Patient has no history of a colonoscopy. No complaints today. Patient will discuss with PCP when establishes care.

## 2016-02-19 ENCOUNTER — Other Ambulatory Visit (HOSPITAL_COMMUNITY): Payer: Self-pay | Admitting: *Deleted

## 2016-02-19 DIAGNOSIS — A599 Trichomoniasis, unspecified: Secondary | ICD-10-CM

## 2016-02-19 LAB — CYTOLOGY - PAP

## 2016-02-19 MED ORDER — METRONIDAZOLE 500 MG PO TABS: 500.0000 mg | ORAL_TABLET | Freq: Two times a day (BID) | ORAL | Status: DC

## 2016-02-20 ENCOUNTER — Telehealth (HOSPITAL_COMMUNITY): Payer: Self-pay | Admitting: *Deleted

## 2016-02-20 NOTE — Telephone Encounter (Signed)
Telephoned patient at home # and left message to return call to [facility] 

## 2016-02-26 ENCOUNTER — Telehealth (HOSPITAL_COMMUNITY): Payer: Self-pay | Admitting: *Deleted

## 2016-02-26 ENCOUNTER — Encounter (HOSPITAL_COMMUNITY): Payer: Self-pay | Admitting: *Deleted

## 2016-02-26 NOTE — Telephone Encounter (Signed)
Telephoned patient at home # and discussed negative pap smear results. HPV was negative. Advised patient that pap smear did show trichomonas and medication was called into pharmacy. Advised patient her partner would also need to be treated, no sexual intercourse until finish medication, and also no alcohol while taking medication. Next pap smear due in 5 years due to history of no abnormal pap. Patient voiced understanding.

## 2016-02-29 ENCOUNTER — Other Ambulatory Visit: Payer: Self-pay

## 2016-02-29 ENCOUNTER — Ambulatory Visit: Payer: Self-pay

## 2016-03-21 ENCOUNTER — Other Ambulatory Visit: Payer: Self-pay

## 2016-03-21 ENCOUNTER — Ambulatory Visit: Payer: Self-pay

## 2016-04-04 ENCOUNTER — Other Ambulatory Visit: Payer: Self-pay

## 2016-04-04 ENCOUNTER — Ambulatory Visit: Payer: Self-pay

## 2016-04-04 VITALS — BP 120/86 | HR 86 | Temp 98.0°F | Resp 16 | Ht 63.0 in | Wt 195.6 lb

## 2016-04-04 DIAGNOSIS — Z0000 Encounter for general adult medical examination without abnormal findings: Secondary | ICD-10-CM

## 2016-04-04 NOTE — Patient Instructions (Signed)
Discussed health assessment with patient.She will be called with results of lab work and we will then discussed any further follow up the patient needs. Patient verbalized understanding. 

## 2016-04-04 NOTE — Progress Notes (Signed)
Patient is a new patient to the [facility] program and is currently a [facility] patient effective 02/15/2016.   Clinical Measurements: Patient is 5 ft. 3 inches, weight 195.6 lbs, and BMI 34.7.   Medical History: Patient has no history of high cholesterol. Patient does not have a history of hypertension or diabetes. Per patient no diagnosed history of coronary heart disease, heart attack, heart failure, stroke/TIA, vascular disease or congenital heart defects.  Blood Pressure, Self-measurement: Patient states has no reason to check Blood pressure.  Nutrition Assessment: Patient stated that not eat fruit every day, maybe twice a week. Patient states she eats 2 servings of vegetables a day. Per patient does eat 3 or more ounces of whole grains daily. Patient doesn't eat two or more servings of fish weekly. Patient states she does drink more than 36 ounces or 450 calories of beverages with added sugars weekly. Patient stated she does not watch her salt intake.   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  Smoking Status: Patient stated that has been trying t o quit smoking and has cut down to 10 cigarettes a day and is exposed to smoke less than an hour. Discuss quit smoking programs and will make a decision when I call labs.  Quality of Life Assessment: In assessing patient's physical quality of life she stated that out of the past 30 days that she has felt her health was good all but seven days. Patient also stated that in the past 30 days that her mental health was not good including stress, depression and problems with emotions for 20 days. Patient did state that out of the past 30 days she felt her physical or mental health had  kept her from doing her usual activities including self-care, work or recreation for 5 days.   Plan: Lab work will be done  today including a lipid panel and Hgb A1C. Will call lab results when they are finished. Patient will receive Health Coaching for risk reduction initially and then as patient wants.

## 2016-04-05 ENCOUNTER — Telehealth: Payer: Self-pay

## 2016-04-05 LAB — LIPID PANEL
CHOLESTEROL TOTAL: 191 mg/dL (ref 100–199)
Chol/HDL Ratio: 4.5 ratio units — ABNORMAL HIGH (ref 0.0–4.4)
HDL: 42 mg/dL (ref >39)
LDL Calculated: 124 mg/dL — ABNORMAL HIGH (ref 0–99)
TRIGLYCERIDES: 125 mg/dL (ref 0–149)
VLDL Cholesterol Cal: 25 mg/dL (ref 5–40)

## 2016-04-05 LAB — HEMOGLOBIN A1C
ESTIMATED AVERAGE GLUCOSE: 128 mg/dL
HEMOGLOBIN A1C: 6.1 % — AB (ref 4.8–5.6)

## 2016-04-05 NOTE — Telephone Encounter (Signed)
Attempted to call lab results from 4/13. Left message to return call.

## 2016-04-10 ENCOUNTER — Telehealth: Payer: Self-pay

## 2016-04-10 NOTE — Telephone Encounter (Signed)
LAB RESULTS  Called to inform about lab work from 04/04/16. I informed patient: BMI 34.7, cholesterol- 191, HDL- 42, LDL- 124, triglycerides - 125, and HBG-A1C - 6.1.   RISK REDUCTION COUNSELING WITH HEALTH COACHING:Did risk reduction Health Coaching concerning BMI. Informed that normal BMI was 18 to 25 and patient is in obese range. Discussed the need to loose weight, decrease sugar in diet and starches with examples given. Informed patient needs to stay away from drinks and food that have too much sugar. Patient asked about what did she needs to do concerning high LDL's. Discussed briefly increasing fiber and exercise to help lower LDL's. Discussed portion sizes and avoiding fatty food.   PLAN: Patient to work on problem areas and will call back in a month for further health coaching or if want to come in for one on one health coaching for more details.  TIME SPENT WITH PATIENT: 15 minutes.Marland Kitchen

## 2016-04-23 ENCOUNTER — Telehealth: Payer: Self-pay

## 2016-04-23 NOTE — Telephone Encounter (Signed)
Called and patient stated was doing good. 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

## 2016-05-14 ENCOUNTER — Telehealth: Payer: Self-pay

## 2016-05-14 NOTE — Telephone Encounter (Signed)
[place] Patient called per phone today for Health Coaching. Patient's areas of concern are weight, LDL, A1C and exercise.  HEALTH COACHING: Patient stated had started eating wheat bread  and brown rice. Per patient has increased the amount of vegetables that she is eating. Patient stated that had totally changed her diet. She has been decreasing her carbohydrates and not drinking as many sugary drinks. Discussed drins that can substitute. Per patient can tell is losing weight.  Patient stated that is cutting grass for exercise and trying to walk daily. Asked about doing flexibility and balance exercises. Patient stated some interest in getting a primary doctor. Stated that will discuss that in the near future.   PLAN: Will Call in 2 to 4 weeks. Will continue with increasing exercise. Will continue to decrease sugar and starch intake.Marland Kitchen

## 2016-07-16 ENCOUNTER — Telehealth: Payer: Self-pay

## 2016-07-16 NOTE — Telephone Encounter (Signed)
I called Cheyenne Fowler to follow up on how she was doing with, weight exercise and fiber. Voicemail was reached and left message for patient to return call.

## 2016-07-26 ENCOUNTER — Telehealth: Payer: Self-pay

## 2016-07-26 NOTE — Telephone Encounter (Signed)
Called for third health coaching session. I reached voicemail and left message to return call.

## 2016-07-29 ENCOUNTER — Telehealth: Payer: Self-pay

## 2016-07-29 NOTE — Telephone Encounter (Signed)
[place] to follow up on how was managing to loose weight, decrease LDL and A1C. This is final session of health coaching. Patient stated that was doing like she was suppose to with Starches and sugars. Per patient does not eat much. Explained that a serving of starch was a half cup or a piece. For fruits and vegetable a serving is one half a cup. Patient stated that maybe it was her meats. Patient stated does eat a fair amount of fried meats. Discussed lean meats and not frying but can pan sear in light oil. Patient asked to be called back in a couple of weeks to see how is doing and then maybe have schedule an appointment to come in.  PLAN - Will call back for follow up assessment and see how is doing. Possibly set up one one health coach session.  TIME: 54minutes

## 2017-02-10 ENCOUNTER — Telehealth: Payer: Self-pay

## 2017-02-10 NOTE — Telephone Encounter (Signed)
Called for patients wisewoman follow up. No answer

## 2017-03-14 NOTE — Telephone Encounter (Signed)
Wisewoman  Follow-up / Lost to follow up  Attempted to call patient on multiple occasions for wisewoman follow up with no return phone call. Patient lost to follow up

## 2017-03-28 ENCOUNTER — Encounter (HOSPITAL_COMMUNITY): Payer: Self-pay

## 2017-03-28 ENCOUNTER — Emergency Department (HOSPITAL_COMMUNITY): Payer: Self-pay

## 2017-03-28 ENCOUNTER — Emergency Department (HOSPITAL_COMMUNITY)
Admission: EM | Admit: 2017-03-28 | Discharge: 2017-03-28 | Disposition: A | Discharge status: Home / Self Care | Payer: Self-pay | Attending: Emergency Medicine | Admitting: Emergency Medicine

## 2017-03-28 DIAGNOSIS — R109 Unspecified abdominal pain: Secondary | ICD-10-CM

## 2017-03-28 DIAGNOSIS — F17210 Nicotine dependence, cigarettes, uncomplicated: Secondary | ICD-10-CM | POA: Insufficient documentation

## 2017-03-28 DIAGNOSIS — N3000 Acute cystitis without hematuria: Secondary | ICD-10-CM | POA: Insufficient documentation

## 2017-03-28 DIAGNOSIS — R1031 Right lower quadrant pain: Secondary | ICD-10-CM | POA: Insufficient documentation

## 2017-03-28 LAB — CBC WITH DIFFERENTIAL/PLATELET
BASOS ABS: 0 10*3/uL (ref 0.0–0.1)
BASOS PCT: 0 %
EOS ABS: 0.1 10*3/uL (ref 0.0–0.7)
Eosinophils Relative: 1 %
HCT: 40.9 % (ref 36.0–46.0)
HEMOGLOBIN: 12.9 g/dL (ref 12.0–15.0)
LYMPHS ABS: 3.9 10*3/uL (ref 0.7–4.0)
Lymphocytes Relative: 25 %
MCH: 25.3 pg — AB (ref 26.0–34.0)
MCHC: 31.5 g/dL (ref 30.0–36.0)
MCV: 80.4 fL (ref 78.0–100.0)
Monocytes Absolute: 1.2 10*3/uL — ABNORMAL HIGH (ref 0.1–1.0)
Monocytes Relative: 7 %
NEUTROS PCT: 67 %
Neutro Abs: 10.6 10*3/uL — ABNORMAL HIGH (ref 1.7–7.7)
PLATELETS: 326 10*3/uL (ref 150–400)
RBC: 5.09 MIL/uL (ref 3.87–5.11)
RDW: 18.1 % — AB (ref 11.5–15.5)
WBC: 15.9 10*3/uL — AB (ref 4.0–10.5)

## 2017-03-28 LAB — URINALYSIS, MICROSCOPIC (REFLEX)

## 2017-03-28 LAB — URINALYSIS, ROUTINE W REFLEX MICROSCOPIC
BILIRUBIN URINE: NEGATIVE
Glucose, UA: NEGATIVE mg/dL
HGB URINE DIPSTICK: NEGATIVE
KETONES UR: NEGATIVE mg/dL
Nitrite: NEGATIVE
PROTEIN: NEGATIVE mg/dL
Specific Gravity, Urine: 1.02 (ref 1.005–1.030)
pH: 5.5 (ref 5.0–8.0)

## 2017-03-28 LAB — COMPREHENSIVE METABOLIC PANEL
ALT: 76 U/L — ABNORMAL HIGH (ref 14–54)
ANION GAP: 9 (ref 5–15)
AST: 85 U/L — AB (ref 15–41)
Albumin: 3.8 g/dL (ref 3.5–5.0)
Alkaline Phosphatase: 90 U/L (ref 38–126)
BUN: 9 mg/dL (ref 6–20)
CHLORIDE: 105 mmol/L (ref 101–111)
CO2: 25 mmol/L (ref 22–32)
Calcium: 9.5 mg/dL (ref 8.9–10.3)
Creatinine, Ser: 0.91 mg/dL (ref 0.44–1.00)
GFR calc Af Amer: >60 mL/min (ref >60)
Glucose, Bld: 121 mg/dL — ABNORMAL HIGH (ref 65–99)
POTASSIUM: 4 mmol/L (ref 3.5–5.1)
Sodium: 139 mmol/L (ref 135–145)
Total Bilirubin: 0.4 mg/dL (ref 0.3–1.2)
Total Protein: 7.2 g/dL (ref 6.5–8.1)

## 2017-03-28 LAB — I-STAT BETA HCG BLOOD, ED (MC, WL, AP ONLY)

## 2017-03-28 LAB — LIPASE, BLOOD: LIPASE: 32 U/L (ref 11–51)

## 2017-03-28 MED ORDER — HYDROCODONE-ACETAMINOPHEN 5-325 MG PO TABS: 1.0000 | ORAL_TABLET | ORAL | 0 refills | Status: DC | PRN

## 2017-03-28 MED ORDER — HYDROMORPHONE HCL 1 MG/ML IJ SOLN
0.5000 mg | INTRAMUSCULAR | Status: DC | PRN
  Administered 2017-03-28 (×2): 0.5 mg via INTRAVENOUS
  Filled 2017-03-28 (×2): qty 1

## 2017-03-28 MED ORDER — ONDANSETRON HCL 4 MG/2ML IJ SOLN
4.0000 mg | Freq: Once | INTRAMUSCULAR | Status: AC
  Administered 2017-03-28: 4 mg via INTRAVENOUS
  Filled 2017-03-28: qty 2

## 2017-03-28 MED ORDER — SODIUM CHLORIDE 0.9 % IV SOLN
INTRAVENOUS | Status: DC
  Administered 2017-03-28: 125 mL/h via INTRAVENOUS

## 2017-03-28 MED ORDER — SODIUM CHLORIDE 0.9 % IV BOLUS (SEPSIS)
1000.0000 mL | Freq: Once | INTRAVENOUS | Status: AC
  Administered 2017-03-28: 1000 mL via INTRAVENOUS

## 2017-03-28 MED ORDER — CIPROFLOXACIN HCL 500 MG PO TABS: 500.0000 mg | ORAL_TABLET | Freq: Two times a day (BID) | ORAL | 0 refills | Status: DC

## 2017-03-28 NOTE — ED Provider Notes (Signed)
[facility] DEPT Provider Note   CSN: [id number] Arrival date & time: 03/28/17  0950     History   Chief Complaint Chief Complaint  Patient presents with  . Back Pain    HPI MAFALDA MCGINNISS is a 51 y.o. female.  HPI Pt has been having some sharp burning pain in her lower back the last several days.  It increases with movement.  No pain in her legs.  No numbness or weakness.  In addition to that she has been having trouble with cough and chills.  Some pain in the chest with coughing.  She does smoke but has not history of frequent lung infections.  No measured fevers at home but she has not checked.  No vomiting or diarrhea.   No pain with urination or frequent urination. Past Medical History:  Diagnosis Date  . Allergy   . Anemia   . Heart murmur     Patient Active Problem List   Diagnosis Date Noted  . Diarrhea 07/14/2012  . Community acquired pneumonia 07/13/2012  . Chest pain 07/13/2012  . Depression 07/13/2012  . Hypokalemia 07/13/2012  . Tobacco abuse 07/13/2012  . Leukocytosis 07/13/2012  . Hyperglycemia 07/13/2012  . 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 Polysubstance abuse 05/22/2012    Class: Acute  . Polysubstance (excluding opioids) dependence ([place]) 05/22/2012    Class: Acute  . Breast tenderness in female 03/18/2012  . Health care maintenance 03/18/2012  . DEPRESSION 08/17/2007  . HEEL PAIN, RIGHT 08/17/2007    Past Surgical History:  Procedure Laterality Date  . CESAREAN SECTION    . HERNIA REPAIR    . TUBAL LIGATION      OB History    Gravida Para Term Preterm AB Living   3 3 3    0 3   SAB TAB Ectopic Multiple Live Births                   Home Medications    Prior to Admission medications   Medication Sig Start Date End Date Taking? Authorizing Provider  amitriptyline (ELAVIL) 25 MG tablet Take 1 tablet (25 mg total) by mouth at bedtime. For pain control/sleep/depression 05/25/12  05/25/13  Encarnacion Slates, NP  carbamazepine (TEGRETOL) 200 MG tablet Take 1 tablet (200 mg total) by mouth daily at 8 pm. For mood control 05/25/12 05/25/13  Encarnacion Slates, NP  ciprofloxacin (CIPRO) 500 MG tablet Take 1 tablet (500 mg total) by mouth every 12 (twelve) hours. 03/28/17   Dorie Rank, MD  citalopram (CELEXA) 20 MG tablet Take 1 tablet (20 mg total) by mouth daily. For depression 05/25/12 05/25/13  Encarnacion Slates, NP  diphenhydrAMINE (SOMINEX) 25 MG tablet Take 1 tablet (25 mg total) by mouth at bedtime as needed for allergies (Allergies). For allergies Patient not taking: Reported on 02/15/2016 05/25/12   Encarnacion Slates, NP  gabapentin (NEURONTIN) 100 MG capsule Take 1 capsule (100 mg total) by mouth 3 (three) times daily. For anxiety/pain 05/25/12 05/25/13  Encarnacion Slates, NP  HYDROcodone-acetaminophen (NORCO/VICODIN) 5-325 MG tablet Take 1 tablet by mouth every 4 (four) hours as needed. 03/28/17   Dorie Rank, MD  metroNIDAZOLE (FLAGYL) 500 MG tablet Take 1 tablet (500 mg total) by mouth 2 (two) times daily. 02/19/16   Mora Bellman, MD    Family History Family History  Problem Relation Age of Onset  . Diabetes Mother   . Cancer Mother  liver  . Cancer Maternal Grandfather     throat  . Breast cancer Maternal Aunt   . Breast cancer Maternal Aunt     pancreatic  . Bipolar disorder Sister   . Schizophrenia Brother     Social History Social History  Substance Use Topics  . Smoking status: Current Every Day Smoker    Packs/day: 0.25    Years: 20.00    Types: Cigarettes  . Smokeless tobacco: Never Used  . Alcohol use Yes     Comment: Beer 80 oz     Allergies   Penicillins and Sulfonamide derivatives   Review of Systems Review of Systems  All other systems reviewed and are negative.    Physical Exam Updated Vital Signs BP 109/72 (BP Location: Left Arm)   Pulse 79   Temp 98.3 F (36.8 C) (Oral)   Resp 16   Ht 5\' 4"  (1.626 m)   Wt 90.7 kg   LMP 12/28/2016 (Within Months)    SpO2 96%   BMI 34.33 kg/m   Physical Exam  Constitutional: She appears well-developed and well-nourished. No distress.  HENT:  Head: Normocephalic and atraumatic.  Right Ear: External ear normal.  Left Ear: External ear normal.  Eyes: Conjunctivae are normal. Right eye exhibits no discharge. Left eye exhibits no discharge. No scleral icterus.  Neck: Neck supple. No tracheal deviation present.  Cardiovascular: Normal rate, regular rhythm and intact distal pulses.   Pulmonary/Chest: Effort normal and breath sounds normal. No stridor. No respiratory distress. She has no wheezes. She has no rales.  Abdominal: Soft. Bowel sounds are normal. She exhibits no distension. There is tenderness in the right lower quadrant. There is no rebound and no guarding.  ttp lower abdomen and lateral aspect  Musculoskeletal: She exhibits no edema or tenderness.  Neurological: She is alert. She has normal strength. No cranial nerve deficit (no facial droop, extraocular movements intact, no slurred speech) or sensory deficit. She exhibits normal muscle tone. She displays no seizure activity. Coordination normal.  Skin: Skin is warm and dry. No rash noted.  Psychiatric: She has a normal mood and affect.  Nursing note and vitals reviewed.    ED Treatments / Results  Labs (all labs ordered are listed, but only abnormal results are displayed) Labs Reviewed  COMPREHENSIVE METABOLIC PANEL - Abnormal; Notable for the following:       Result Value   Glucose, Bld 121 (*)    AST 85 (*)    ALT 76 (*)    All other components within normal limits  CBC WITH DIFFERENTIAL/PLATELET - Abnormal; Notable for the following:    WBC 15.9 (*)    MCH 25.3 (*)    RDW 18.1 (*)    Neutro Abs 10.6 (*)    Monocytes Absolute 1.2 (*)    All other components within normal limits  URINALYSIS, ROUTINE W REFLEX MICROSCOPIC - Abnormal; Notable for the following:    APPearance HAZY (*)    Leukocytes, UA MODERATE (*)    All other  components within normal limits  URINALYSIS, MICROSCOPIC (REFLEX) - Abnormal; Notable for the following:    Bacteria, UA FEW (*)    Squamous Epithelial / LPF 6-30 (*)    All other components within normal limits  LIPASE, BLOOD  I-STAT BETA HCG BLOOD, ED ([facility], [facility], [facility] ONLY)    EKG  EKG Interpretation None       Radiology Dg Chest 2 View  Result Date: 03/28/2017 CLINICAL DATA:  Cough, body  aches, chills and RIGHT side chest/upper back pain for 4 days EXAM: CHEST  2 VIEW COMPARISON:  07/12/2012 FINDINGS: Upper normal heart size. Normal mediastinal contours and pulmonary vascularity. Lungs clear. No pleural effusion pneumothorax. Bones unremarkable. IMPRESSION: No acute abnormalities. Electronically Signed   By: Lavonia Dana M.D.   On: 03/28/2017 11:06   Ct Renal Stone Study  Result Date: 03/28/2017 CLINICAL DATA:  Right flank pain for 4 days, worsening. EXAM: CT ABDOMEN AND PELVIS WITHOUT CONTRAST TECHNIQUE: Multidetector CT imaging of the abdomen and pelvis was performed following the standard protocol without IV contrast. COMPARISON:  None. FINDINGS: Lower chest:  Mild subpleural atelectasis. Hepatobiliary: Prominent hepatic steatosis. There is patchy sparing without evidence of mass lesion.No evidence of biliary obstruction or stone. Pancreas: Unremarkable. Spleen: Unremarkable. Adrenals/Urinary Tract: Negative adrenals. No hydronephrosis or stone. Unremarkable bladder. Stomach/Bowel:  No obstruction. No appendicitis. Vascular/Lymphatic: No acute vascular abnormality. Mild atherosclerotic calcification of the aorta. No mass or adenopathy. Reproductive:No pathologic findings. Other: No ascites or pneumoperitoneum. Musculoskeletal: No acute abnormalities. L4-5 and L5-S1 facet arthropathy. IMPRESSION: 1. No acute finding.  No urolithiasis or appendicitis. 2. Hepatic steatosis. 3. Aortic Atherosclerosis (ICD10-170.0) Electronically Signed   By: Monte Fantasia M.D.   On: 03/28/2017 12:31     Procedures Procedures (including critical care time)  Medications Ordered in ED Medications  sodium chloride 0.9 % bolus 1,000 mL (0 mLs Intravenous Stopped 03/28/17 1233)    And  0.9 %  sodium chloride infusion (125 mL/hr Intravenous New Bag/Given 03/28/17 1235)  HYDROmorphone (DILAUDID) injection 0.5 mg (0.5 mg Intravenous Given 03/28/17 1236)  ondansetron (ZOFRAN) injection 4 mg (4 mg Intravenous Given 03/28/17 1033)     Initial Impression / Assessment and Plan / ED Course  I have reviewed the triage vital signs and the nursing notes.  Pertinent labs & imaging results that were available during my care of the patient were reviewed by me and considered in my medical decision making (see chart for details).   patient presented to the emergent complaints of right flank pain.  Laboratory tests are reassuring. Mild increase in LFTs but no evidence of hyperbilirubinemia. I do not think this is clinically significant. Urinalysis does suggest a urinary tract infection.  CT scan does not show any acute findings. Plan on discharge home with medications for pain as well as antibiotics. Follow up with her primary care doctor.  Final Clinical Impressions(s) / ED Diagnoses   Final diagnoses:  Acute cystitis without hematuria  Flank pain    New Prescriptions New Prescriptions   CIPROFLOXACIN (CIPRO) 500 MG TABLET    Take 1 tablet (500 mg total) by mouth every 12 (twelve) hours.   HYDROCODONE-ACETAMINOPHEN (NORCO/VICODIN) 5-325 MG TABLET    Take 1 tablet by mouth every 4 (four) hours as needed.     Dorie Rank, MD 03/28/17 1329

## 2017-03-28 NOTE — ED Triage Notes (Signed)
Pt c/io right flank pain x 4 days with worsening pain. Difficulty voiding since pain started.

## 2017-03-28 NOTE — Discharge Instructions (Signed)
Follow-up with your primary care doctor, take the medications as prescribed, monitor for fevers worsening symptoms

## 2017-04-01 ENCOUNTER — Emergency Department (HOSPITAL_COMMUNITY)
Admission: EM | Admit: 2017-04-01 | Discharge: 2017-04-01 | Disposition: A | Discharge status: Home / Self Care | Payer: Self-pay | Attending: Emergency Medicine | Admitting: Emergency Medicine

## 2017-04-01 ENCOUNTER — Encounter (HOSPITAL_COMMUNITY): Payer: Self-pay

## 2017-04-01 ENCOUNTER — Emergency Department (HOSPITAL_COMMUNITY): Payer: Self-pay

## 2017-04-01 DIAGNOSIS — F17210 Nicotine dependence, cigarettes, uncomplicated: Secondary | ICD-10-CM | POA: Insufficient documentation

## 2017-04-01 DIAGNOSIS — R109 Unspecified abdominal pain: Secondary | ICD-10-CM

## 2017-04-01 DIAGNOSIS — R1031 Right lower quadrant pain: Secondary | ICD-10-CM | POA: Insufficient documentation

## 2017-04-01 LAB — CBC WITH DIFFERENTIAL/PLATELET
Basophils Absolute: 0.1 10*3/uL (ref 0.0–0.1)
Basophils Relative: 0 %
EOS PCT: 2 %
Eosinophils Absolute: 0.2 10*3/uL (ref 0.0–0.7)
HCT: 40.3 % (ref 36.0–46.0)
Hemoglobin: 12.7 g/dL (ref 12.0–15.0)
LYMPHS ABS: 3.6 10*3/uL (ref 0.7–4.0)
LYMPHS PCT: 30 %
MCH: 25.2 pg — AB (ref 26.0–34.0)
MCHC: 31.5 g/dL (ref 30.0–36.0)
MCV: 80.1 fL (ref 78.0–100.0)
MONO ABS: 1.1 10*3/uL — AB (ref 0.1–1.0)
MONOS PCT: 9 %
NEUTROS ABS: 6.9 10*3/uL (ref 1.7–7.7)
Neutrophils Relative %: 59 %
Platelets: 333 10*3/uL (ref 150–400)
RBC: 5.03 MIL/uL (ref 3.87–5.11)
RDW: 17.9 % — AB (ref 11.5–15.5)
WBC: 11.9 10*3/uL — ABNORMAL HIGH (ref 4.0–10.5)

## 2017-04-01 LAB — URINALYSIS, ROUTINE W REFLEX MICROSCOPIC
BILIRUBIN URINE: NEGATIVE
Bacteria, UA: NONE SEEN
GLUCOSE, UA: NEGATIVE mg/dL
HGB URINE DIPSTICK: NEGATIVE
Ketones, ur: NEGATIVE mg/dL
NITRITE: NEGATIVE
PROTEIN: NEGATIVE mg/dL
Specific Gravity, Urine: 1.005 (ref 1.005–1.030)
pH: 7 (ref 5.0–8.0)

## 2017-04-01 LAB — COMPREHENSIVE METABOLIC PANEL
ALT: 85 U/L — ABNORMAL HIGH (ref 14–54)
ANION GAP: 12 (ref 5–15)
AST: 86 U/L — AB (ref 15–41)
Albumin: 3.7 g/dL (ref 3.5–5.0)
Alkaline Phosphatase: 90 U/L (ref 38–126)
BILIRUBIN TOTAL: 0.2 mg/dL — AB (ref 0.3–1.2)
BUN: 6 mg/dL (ref 6–20)
CHLORIDE: 104 mmol/L (ref 101–111)
CO2: 25 mmol/L (ref 22–32)
Calcium: 9.5 mg/dL (ref 8.9–10.3)
Creatinine, Ser: 0.93 mg/dL (ref 0.44–1.00)
Glucose, Bld: 122 mg/dL — ABNORMAL HIGH (ref 65–99)
POTASSIUM: 3.8 mmol/L (ref 3.5–5.1)
Sodium: 141 mmol/L (ref 135–145)
TOTAL PROTEIN: 7 g/dL (ref 6.5–8.1)

## 2017-04-01 MED ORDER — ONDANSETRON HCL 4 MG/2ML IJ SOLN
4.0000 mg | Freq: Once | INTRAMUSCULAR | Status: AC
  Administered 2017-04-01: 4 mg via INTRAVENOUS
  Filled 2017-04-01: qty 2

## 2017-04-01 MED ORDER — HYDROCODONE-ACETAMINOPHEN 5-325 MG PO TABS: 1.0000 | ORAL_TABLET | Freq: Three times a day (TID) | ORAL | 0 refills | Status: AC | PRN

## 2017-04-01 MED ORDER — LIDOCAINE 5 % EX OINT: 1.0000 "application " | TOPICAL_OINTMENT | Freq: Three times a day (TID) | CUTANEOUS | 0 refills | Status: DC | PRN

## 2017-04-01 MED ORDER — IOPAMIDOL (ISOVUE-300) INJECTION 61%
INTRAVENOUS | Status: AC
  Administered 2017-04-01: 100 mL
  Filled 2017-04-01: qty 100

## 2017-04-01 MED ORDER — MORPHINE SULFATE (PF) 4 MG/ML IV SOLN
4.0000 mg | Freq: Once | INTRAVENOUS | Status: AC
  Administered 2017-04-01: 4 mg via INTRAVENOUS
  Filled 2017-04-01: qty 1

## 2017-04-01 MED ORDER — IBUPROFEN 600 MG PO TABS: 600.0000 mg | ORAL_TABLET | Freq: Four times a day (QID) | ORAL | 0 refills | Status: DC | PRN

## 2017-04-01 MED ORDER — HYDROCODONE-ACETAMINOPHEN 5-325 MG PO TABS
1.0000 | ORAL_TABLET | Freq: Once | ORAL | Status: AC
  Administered 2017-04-01: 1 via ORAL
  Filled 2017-04-01: qty 1

## 2017-04-01 MED ORDER — SODIUM CHLORIDE 0.9 % IV BOLUS (SEPSIS)
500.0000 mL | Freq: Once | INTRAVENOUS | Status: AC
  Administered 2017-04-01: 500 mL via INTRAVENOUS

## 2017-04-01 NOTE — ED Notes (Signed)
Got patient into a gown waiting on provider

## 2017-04-01 NOTE — ED Triage Notes (Signed)
Patient seen on Friday for right side and flank pain. Reports that she had labs and radiology studies that were normal but wants further evaluation for ongoing pain that is much worse with movement

## 2017-04-01 NOTE — ED Provider Notes (Addendum)
[facility] DEPT Provider Note   CSN: [id number] Arrival date & time: 04/01/17  1201   By signing my name below, I, Avnee Patel, attest that this documentation has been prepared under the direction and in the presence of Fatima Blank, MD  Electronically Signed: Delton [place], ED Scribe. 04/01/17. 1:12 PM.   History   Chief Complaint Chief Complaint  Patient presents with  . recheck right side    HPI Comments:  Cheyenne Fowler is a 51 y.o. female who presents to the Emergency Department complaining of continued, worsened, sharp, right lower abdominal pain and right lower back pain x 8 days. Her pain is worse to the touch, ambulation and certain movements. 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 She notes she has had the chicken pox when she was a little kid. Per chart review, the pt visited the ED on 03/28/17 for similar symptoms. She had lab tests, a chest XR and a CT-scan which were unremarkable. Her urinalysis suggested a urinary tract infection for which she was prescribed Cipro. She was also prescribed hydrocodone for pain which she has taken with no relief. She has additionally used a heating pad with no relief. Pt states laying still sometimes provides relief. Pt denies a rash or any other associated symptoms. No other complaints noted at this time.     The history is provided by the patient. No language interpreter was used.    Past Medical History:  Diagnosis Date  . Allergy   . Anemia   . Heart murmur     Patient Active Problem List   Diagnosis Date Noted  . Diarrhea 07/14/2012  . Community acquired pneumonia 07/13/2012  . Chest pain 07/13/2012  . Depression 07/13/2012  . Hypokalemia 07/13/2012  . Tobacco abuse 07/13/2012  . Leukocytosis 07/13/2012  . Hyperglycemia 07/13/2012  . 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 Polysubstance abuse 05/22/2012   Class: Acute  . Polysubstance (excluding opioids) dependence ([place]) 05/22/2012    Class: Acute  . Breast tenderness in female 03/18/2012  . Health care maintenance 03/18/2012  . DEPRESSION 08/17/2007  . HEEL PAIN, RIGHT 08/17/2007    Past Surgical History:  Procedure Laterality Date  . CESAREAN SECTION    . HERNIA REPAIR    . TUBAL LIGATION      OB History    Gravida Para Term Preterm AB Living   3 3 3    0 3   SAB TAB Ectopic Multiple Live Births                   Home Medications    Prior to Admission medications   Medication Sig Start Date End Date Taking? Authorizing Provider  amitriptyline (ELAVIL) 25 MG tablet Take 1 tablet (25 mg total) by mouth at bedtime. For pain control/sleep/depression 05/25/12 05/25/13  Encarnacion Slates, NP  carbamazepine (TEGRETOL) 200 MG tablet Take 1 tablet (200 mg total) by mouth daily at 8 pm. For mood control 05/25/12 05/25/13  Encarnacion Slates, NP  ciprofloxacin (CIPRO) 500 MG tablet Take 1 tablet (500 mg total) by mouth every 12 (twelve) hours. 03/28/17   Dorie Rank, MD  citalopram (CELEXA) 20 MG tablet Take 1 tablet (20 mg total) by mouth daily. For depression 05/25/12 05/25/13  Encarnacion Slates, NP  diphenhydrAMINE (SOMINEX) 25 MG tablet Take 1 tablet (25 mg total) by mouth at bedtime as needed for allergies (Allergies). For  allergies Patient not taking: Reported on 02/15/2016 05/25/12   Encarnacion Slates, NP  gabapentin (NEURONTIN) 100 MG capsule Take 1 capsule (100 mg total) by mouth 3 (three) times daily. For anxiety/pain 05/25/12 05/25/13  Encarnacion Slates, NP  HYDROcodone-acetaminophen (NORCO/VICODIN) 5-325 MG tablet Take 1 tablet by mouth every 8 (eight) hours as needed for severe pain (That is not improved by your scheduled acetaminophen regimen). Please do not exceed 4000 mg of acetaminophen (Tylenol) a 24-hour period. Please note that he may be prescribed additional medicine that contains acetaminophen. 04/01/17 04/06/17  Fatima Blank, MD  ibuprofen (ADVIL,MOTRIN)  600 MG tablet Take 1 tablet (600 mg total) by mouth every 6 (six) hours as needed. 04/01/17   Fatima Blank, MD  lidocaine (XYLOCAINE) 5 % ointment Apply 1 application topically 3 (three) times daily as needed. 04/01/17   Fatima Blank, MD  metroNIDAZOLE (FLAGYL) 500 MG tablet Take 1 tablet (500 mg total) by mouth 2 (two) times daily. 02/19/16   Mora Bellman, MD    Family History Family History  Problem Relation Age of Onset  . Diabetes Mother   . Cancer Mother     liver  . Cancer Maternal Grandfather     throat  . Breast cancer Maternal Aunt   . Breast cancer Maternal Aunt     pancreatic  . Bipolar disorder Sister   . Schizophrenia Brother     Social History Social History  Substance Use Topics  . Smoking status: Current Every Day Smoker    Packs/day: 0.25    Years: 20.00    Types: Cigarettes  . Smokeless tobacco: Never Used  . Alcohol use Yes     Comment: Beer 80 oz     Allergies   Penicillins and Sulfonamide derivatives   Review of Systems Review of Systems All other systems reviewed and are negative for acute change except as noted in the HPI.   Physical Exam Updated Vital Signs BP 108/88   Pulse 97   Temp 98 F (36.7 C) (Oral)   Resp 18   SpO2 94%   Physical Exam  Constitutional: She is oriented to person, place, and time. She appears well-developed and well-nourished. No distress.  HENT:  Head: Normocephalic and atraumatic.  Nose: Nose normal.  Eyes: Conjunctivae and EOM are normal. Pupils are equal, round, and reactive to light. Right eye exhibits no discharge. Left eye exhibits no discharge. No scleral icterus.  Neck: Normal range of motion. Neck supple.  Cardiovascular: Normal rate and regular rhythm.  Exam reveals no gallop and no friction rub.   No murmur heard. Pulmonary/Chest: Effort normal and breath sounds normal. No stridor. No respiratory distress. She has no rales.  Abdominal: Soft. She exhibits no distension. There is  tenderness in the right lower quadrant.  Tenderness to right flank and across the right quadrant.   Musculoskeletal: She exhibits no edema or tenderness.  Neurological: She is alert and oriented to person, place, and time.  Skin: Skin is warm and dry. No rash noted. She is not diaphoretic. No erythema.  No rashes noted. Tender to light tactile about the right flank across the right quadrant, T11-T12 region   Psychiatric: She has a normal mood and affect.  Vitals reviewed.   ED Treatments / Results  DIAGNOSTIC STUDIES:  Oxygen Saturation is 100% on RA, normal by my interpretation.    COORDINATION OF CARE:  1:07 PM Discussed treatment plan with pt at bedside and pt agreed to plan.  Labs (all labs ordered are listed, but only abnormal results are displayed) Labs Reviewed  CBC WITH DIFFERENTIAL/PLATELET - Abnormal; Notable for the following:       Result Value   WBC 11.9 (*)    MCH 25.2 (*)    RDW 17.9 (*)    Monocytes Absolute 1.1 (*)    All other components within normal limits  COMPREHENSIVE METABOLIC PANEL - Abnormal; Notable for the following:    Glucose, Bld 122 (*)    AST 86 (*)    ALT 85 (*)    Total Bilirubin 0.2 (*)    All other components within normal limits  URINALYSIS, ROUTINE W REFLEX MICROSCOPIC - Abnormal; Notable for the following:    Color, Urine STRAW (*)    Leukocytes, UA TRACE (*)    Squamous Epithelial / LPF 0-5 (*)    All other components within normal limits    EKG  EKG Interpretation None       Radiology Ct Abdomen Pelvis W Contrast  Result Date: 04/01/2017 CLINICAL DATA:  51 year old female with a 10 days of right side abdominal pain. Subsequent encounter. EXAM: CT ABDOMEN AND PELVIS WITH CONTRAST TECHNIQUE: Multidetector CT imaging of the abdomen and pelvis was performed using the standard protocol following bolus administration of intravenous contrast. CONTRAST:  100 mL ISOVUE-300 IOPAMIDOL (ISOVUE-300) INJECTION 61% COMPARISON:  CT  Abdomen and Pelvis without contrast 03/28/2017 FINDINGS: Lower chest: Stable mild dependent pulmonary opacity which most resembles atelectasis. No pericardial or pleural effusion. Hepatobiliary: Mild hepatic steatosis. Otherwise negative liver. Negative gallbladder. Pancreas: Negative. Spleen: Negative. Adrenals/Urinary Tract: Normal adrenal glands. Bilateral renal enhancement and contrast excretion is normal. Unremarkable urinary bladder.  Stable pelvic phleboliths. No abdominal free fluid. Stomach/Bowel: Negative rectosigmoid colon. Negative descending colon. Mildly redundant hepatic flexure. Negative transverse and right colon except for retained stool. Normal appendix (series 3 image 55). Negative terminal ileum. No dilated small bowel. Negative stomach and duodenum. Vascular/Lymphatic: Minor Calcified aortic atherosclerosis. Major arterial structures in the abdomen and pelvis are patent. Portal venous system is patent. No lymphadenopathy. Reproductive: Negative. Other: No pelvic free fluid. Musculoskeletal: Lower lumbar moderate to severe facet degeneration. No acute osseous abnormality identified. IMPRESSION: 1. Normal appendix. No acute or inflammatory process in the abdomen or pelvis. 2. Hepatic steatosis. Electronically Signed   By: Genevie Ann M.D.   On: 04/01/2017 15:10    Procedures Procedures (including critical care time)  Medications Ordered in ED Medications  HYDROcodone-acetaminophen (NORCO/VICODIN) 5-325 MG per tablet 1 tablet (not administered)  morphine 4 MG/ML injection 4 mg (4 mg Intravenous Given 04/01/17 1314)  ondansetron (ZOFRAN) injection 4 mg (4 mg Intravenous Given 04/01/17 1325)  sodium chloride 0.9 % bolus 500 mL (0 mLs Intravenous Stopped 04/01/17 1415)  iopamidol (ISOVUE-300) 61 % injection (100 mLs  Contrast Given 04/01/17 1449)     Initial Impression / Assessment and Plan / ED Course  I have reviewed the triage vital signs and the nursing notes.  Pertinent labs & imaging  results that were available during my care of the patient were reviewed by me and considered in my medical decision making (see chart for details).     Right flank/right lower quadrant pain and tenderness to palpation which seems to have worsened since previous encounter. During the previous encounter and she had a workup that was suspicious for urinary tract infection and treated with ciprofloxacin. CT stone study was negative at that time.  Today screening labs obtained. CT scan with contrast obtained to rule  out appendicitis. Labs are improving and grossly reassuring. UA without evidence of infection. CT scan without evidence of appendicitis or other serious intra-abdominal assess.  Given the tenderness to light tactile is a concern for possible early onset herpes zoster/shingles.  Will not start the patient on antivirals as there is no rash at this time, but will have her follow-up with her primary care provider. Provided with topical lidocaine and additional pain medicine. Check to [facility] narcotics database and patient was only provided with 2 day supply of Norco.  The patient is safe for discharge with strict return precautions.   Final Clinical Impressions(s) / ED Diagnoses   Final diagnoses:  Right flank pain   Disposition: Discharge  Condition: Good  I have discussed the results, Dx and Tx plan with the patient who expressed understanding and agree(s) with the plan. Discharge instructions discussed at great length. The patient was given strict return precautions who verbalized understanding of the instructions. No further questions at time of discharge.    New Prescriptions   HYDROCODONE-ACETAMINOPHEN (NORCO/VICODIN) 5-325 MG TABLET    Take 1 tablet by mouth every 8 (eight) hours as needed for severe pain (That is not improved by your scheduled acetaminophen regimen). Please do not exceed 4000 mg of acetaminophen (Tylenol) a 24-hour period. Please note that he may be  prescribed additional medicine that contains acetaminophen.   IBUPROFEN (ADVIL,MOTRIN) 600 MG TABLET    Take 1 tablet (600 mg total) by mouth every 6 (six) hours as needed.   LIDOCAINE (XYLOCAINE) 5 % OINTMENT    Apply 1 application topically 3 (three) times daily as needed.    Follow Up: Mora Bellman, MD [place] [address] [phone]  Schedule an appointment as soon as possible for a visit  in 3-5 days for close follow up to assess for possible shingles   I personally performed the services described in this documentation, which was scribed in my presence. The recorded information has been reviewed and is accurate.        Fatima Blank, MD 04/01/17 [place], MD 04/01/17 [phone]

## 2017-10-08 ENCOUNTER — Encounter (HOSPITAL_COMMUNITY): Payer: Self-pay

## 2018-06-11 ENCOUNTER — Other Ambulatory Visit (HOSPITAL_COMMUNITY): Payer: Self-pay | Admitting: *Deleted

## 2018-06-11 DIAGNOSIS — N644 Mastodynia: Secondary | ICD-10-CM

## 2018-06-23 ENCOUNTER — Ambulatory Visit
Admission: RE | Admit: 2018-06-23 | Discharge: 2018-06-23 | Disposition: A | Discharge status: Home / Self Care | Payer: No Typology Code available for payment source | Source: Ambulatory Visit | Attending: Obstetrics and Gynecology | Admitting: Obstetrics and Gynecology

## 2018-06-23 ENCOUNTER — Ambulatory Visit (HOSPITAL_COMMUNITY)
Admission: RE | Admit: 2018-06-23 | Discharge: 2018-06-23 | Disposition: A | Discharge status: Home / Self Care | Payer: Self-pay | Source: Ambulatory Visit | Attending: Obstetrics and Gynecology | Admitting: Obstetrics and Gynecology

## 2018-06-23 ENCOUNTER — Encounter (HOSPITAL_COMMUNITY): Payer: Self-pay

## 2018-06-23 VITALS — BP 124/76 | Ht 63.5 in | Wt 195.0 lb

## 2018-06-23 DIAGNOSIS — N644 Mastodynia: Secondary | ICD-10-CM

## 2018-06-23 DIAGNOSIS — Z1239 Encounter for other screening for malignant neoplasm of breast: Secondary | ICD-10-CM

## 2018-06-23 NOTE — Progress Notes (Signed)
Complaints of bilateral outer breast pain. Patient states the pain comes and goes. Patient rates the pain at a 8 out of 10.  Pap Smear: Pap smear not completed today. Last Pap smear was 02/15/2016 at [facility] and normal with negative HPV. Per patient has no history of an abnormal Pap smear. Last two Pap smear results are in Epic.  Physical exam: Breasts Breasts symmetrical. No skin abnormalities bilateral breasts. No nipple retraction bilateral breasts. No nipple discharge bilateral breasts. No lymphadenopathy. No lumps palpated bilateral breasts. Complaints of bilateral outer breast pain on exam. Referred patient to the [facility] for a diagnostic mammogram. Appointment scheduled for Tuesday, June 23, 2018 at 1120.        Pelvic/Bimanual No Pap smear completed today since last Pap smear and HPV typing was 02/15/2016. Pap smear not indicated per BCCCP guidelines.   Smoking History: Patient is a current smoker. Discussed smoking cessation with patient. Referred to the [facility] Quitline and gave resources to the free smoking cessation classes at [facility].   Patient Navigation: Patient education provided. Access to services provided for patient through [facility] program.   Colorectal Cancer Screening: Per patient has never had a colonoscopy completed. No complaints today. FIT Test given to patient to complete and return to BCCCP.  Breast and Cervical Cancer Risk Assessment: Patient has a family history of two maternal aunts having breast cancer. Patient has no known genetic mutations or history of radiation treatment to the chest before age 56. Patient has no history of cervical dysplasia, immunocompromised, or DES exposure in-utero. Risk Assessment    Risk Scores      06/23/2018   Last edited by: Armond Hang, LPN   5-year risk: 1.2 %   Lifetime risk: 8.3 %

## 2018-06-23 NOTE — Patient Instructions (Signed)
Explained breast self awareness with [name]. Patient did not need a Pap smear today due to last Pap smear and HPV typing was 02/15/2016. Let her know BCCCP will cover Pap smears and HPV typing every 5 years unless has a history of abnormal Pap smears. Referred patient to the [facility] for a diagnostic mammogram. Appointment scheduled for Tuesday, June 23, 2018 at 1120. Discussed smoking cessation with patient. Referred to the [facility] Quitline and gave resources to the free smoking cessation classes at [facility]. [name] verbalized understanding.  [name], Arvil Chaco, RN 10:43 AM

## 2018-06-24 ENCOUNTER — Encounter (HOSPITAL_COMMUNITY): Payer: Self-pay | Admitting: *Deleted

## 2018-06-25 ENCOUNTER — Other Ambulatory Visit: Payer: Self-pay | Admitting: Obstetrics and Gynecology

## 2018-07-01 ENCOUNTER — Other Ambulatory Visit: Payer: Self-pay

## 2018-07-01 ENCOUNTER — Ambulatory Visit: Payer: Self-pay

## 2018-07-01 ENCOUNTER — Other Ambulatory Visit (HOSPITAL_COMMUNITY): Payer: Self-pay | Admitting: *Deleted

## 2018-07-01 DIAGNOSIS — Z0000 Encounter for general adult medical examination without abnormal findings: Secondary | ICD-10-CM

## 2018-07-02 NOTE — Addendum Note (Signed)
Addended by: Stephenie Acres on: 07/02/2018 12:52 PM   Modules accepted: Orders

## 2018-07-03 ENCOUNTER — Inpatient Hospital Stay: Payer: Self-pay

## 2018-07-03 ENCOUNTER — Ambulatory Visit: Payer: Self-pay

## 2018-07-03 ENCOUNTER — Inpatient Hospital Stay: Payer: Self-pay | Attending: Obstetrics and Gynecology | Admitting: *Deleted

## 2018-07-03 VITALS — BP 112/70 | Ht 63.0 in | Wt 197.0 lb

## 2018-07-03 DIAGNOSIS — Z0000 Encounter for general adult medical examination without abnormal findings: Secondary | ICD-10-CM

## 2018-07-03 LAB — LIPID PANEL
Cholesterol: 172 mg/dL (ref 0–200)
HDL: 40 mg/dL — AB (ref >40)
LDL CALC: 101 mg/dL — AB (ref 0–99)
Total CHOL/HDL Ratio: 4.3 RATIO
Triglycerides: 154 mg/dL — ABNORMAL HIGH (ref <150)
VLDL: 31 mg/dL (ref 0–40)

## 2018-07-03 LAB — HEMOGLOBIN A1C
HEMOGLOBIN A1C: 5.8 % — AB (ref 4.8–5.6)
Mean Plasma Glucose: 119.76 mg/dL

## 2018-07-03 LAB — FECAL OCCULT BLOOD, IMMUNOCHEMICAL: FECAL OCCULT BLD: POSITIVE — AB

## 2018-07-03 NOTE — Progress Notes (Signed)
Wisewoman initial screening  Clinical Measurement:  Height: 63in.   Weight: 197lb.  Blood Pressure: 110/70   Blood Pressure #2:  112/70  Fasting Labs Drawn Today, will review with patient when they result.  Medical History:  Patient states that she has not been diagnosed with high cholesterol, high blood pressure, diabetes or heart disease.  Medications:  Patients states she is not taking any medications for high cholesterol, high blood pressure or diabetes.  She is not taking aspirin daily to prevent heart attack or stroke.    Blood pressure, self measurement:  Patients states she does not measure blood pressure at home.   Nutrition:  Patient states she eats 0 cups of fruit and 2 cups of vegetables in an average day.  Patient states she does not eat fish regularly, she eats one half a serving of whole grains daily. She drinks more than 36 ounces of beverages with added sugar weekly.  She is  not  watching her sodium intake.  She has  had 4 drinks containing alcohol in the last seven days.   Physical activity:  Patient states that she gets 420 minutes of moderate exercise in a week.  She gets 0 minutes of vigorous exercise per week.      Smoking status:  Patient states she  Smoke 10-12 cigarettes daily and is  around a smoker everyday.  Quality of life:  Patient states that she has had 6 bad physical days out of the last 30 days. In the last 2 weeks, she has had several days that she has felt down or depressed. She has had several days in the last 2 weeks that she has had little interest or pleasure in doing things.  Risk reduction and counseling:  Patient states she wants to lose weight and increase fruit and vegetable intake.  I encouraged her to continue with current exercise regimen and increase vegetable and fruit intake.  Navigation:  I will notify patient of lab results.  Patient is aware of 2 more health coaching sessions and a follow up.

## 2018-07-03 NOTE — Progress Notes (Signed)
Patient is aware  of a positive Fit test result.  Referred patient to Internal Medicine at [facility] for July 18th

## 2018-07-07 ENCOUNTER — Telehealth (HOSPITAL_COMMUNITY): Payer: Self-pay | Admitting: *Deleted

## 2018-07-07 NOTE — Telephone Encounter (Signed)
Health coaching  2  Labs- cholesterol 172, Triglycerides 154, HDL 40, LDL 101, hemoglobin A1C 5.8, mean plasma glucose 119.76   Patient is aware and understands these lab results.   Goals- Patient states that she will increase her vegetable, fruits and water consumption.  She states she will increase her exercise regimen.  She stated she is eating less fried foods.   Navigation:   Patient is aware of 1 more health coaching session and a follow up.

## 2018-07-09 ENCOUNTER — Ambulatory Visit: Payer: Self-pay

## 2018-07-15 ENCOUNTER — Ambulatory Visit: Payer: Self-pay

## 2018-07-15 ENCOUNTER — Encounter: Payer: Self-pay | Admitting: Internal Medicine

## 2018-07-22 ENCOUNTER — Other Ambulatory Visit: Payer: Self-pay

## 2018-07-22 ENCOUNTER — Ambulatory Visit (INDEPENDENT_AMBULATORY_CARE_PROVIDER_SITE_OTHER): Payer: Self-pay | Admitting: Internal Medicine

## 2018-07-22 VITALS — BP 133/75 | HR 83 | Temp 98.0°F | Ht 64.0 in | Wt 201.8 lb

## 2018-07-22 DIAGNOSIS — Z23 Encounter for immunization: Secondary | ICD-10-CM

## 2018-07-22 DIAGNOSIS — Z0000 Encounter for general adult medical examination without abnormal findings: Secondary | ICD-10-CM

## 2018-07-22 DIAGNOSIS — K921 Melena: Secondary | ICD-10-CM

## 2018-07-22 DIAGNOSIS — F17210 Nicotine dependence, cigarettes, uncomplicated: Secondary | ICD-10-CM

## 2018-07-22 DIAGNOSIS — F319 Bipolar disorder, unspecified: Secondary | ICD-10-CM

## 2018-07-22 DIAGNOSIS — M545 Low back pain: Secondary | ICD-10-CM

## 2018-07-22 DIAGNOSIS — Z720 Tobacco use: Secondary | ICD-10-CM

## 2018-07-22 MED ORDER — TETANUS-DIPHTH-ACELL PERTUSSIS 5-2.5-18.5 LF-MCG/0.5 IM SUSP: 0.5000 mL | Freq: Once | INTRAMUSCULAR | Status: DC

## 2018-07-22 NOTE — Progress Notes (Signed)
   CC: Positive blood on heme-occult  HPI: [name] is a 52 y.o. F w/ PMH of bipolar disorder who presents to the clinic to establish care. She has had intermittent primary care in the past and was referred to IM Clinic through the [facility] program to re-establish follow ups with her primary care. She states she wants to make sure she's up to date with all of her screening tests and shots and make sure she 'gets her health back on track.' She states she was found to have +blood on hemeoccult and wants to get workup. However, she does not yet have insurance coverage but she has a date set for a visit with the financial counselor to get the orange card. 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 She states she was told her BMI was too high and losing weight should alleviate the pain as well so she will work on that through dietary changes.   She states that she continues to smoke about half a pack a day and would like to quit in the future. She states that once she gets insurance and nicotine patches are covered, she will 'really try to quit.' Denies any chest pain, palpitations, shortness of breath, F/N/V/D/C.  Past Medical History:  Diagnosis Date  . Allergy   . Anemia   . Heart murmur    Review of Systems: Review of Systems  Constitutional: Negative for chills, fever and malaise/fatigue.  Respiratory: Negative for cough, shortness of breath and wheezing.   Cardiovascular: Negative for chest pain, palpitations and leg swelling.  Gastrointestinal: Negative for constipation, diarrhea, nausea and vomiting.  Neurological: Negative for sensory change, focal weakness and headaches.    Physical Exam: Vitals:   07/22/18 1051  BP: 133/75  Pulse: 83  Temp: 98 F (36.7 C)  TempSrc: Oral  SpO2: 98%  Weight: 201 lb 12.8 oz (91.5 kg)    Height: 5\' 4"  (1.626 m)   Physical Exam  Constitutional: She is oriented to person, place, and time. She appears well-developed and well-nourished. No distress.  Cardiovascular: Normal rate, regular rhythm, normal heart sounds and intact distal pulses.  Respiratory: Effort normal and breath sounds normal. No respiratory distress. She has no wheezes.  GI: Soft. Bowel sounds are normal. She exhibits no distension. There is no tenderness. There is no guarding.  Musculoskeletal: Normal range of motion. She exhibits no edema or tenderness.  Neurological: She is alert and oriented to person, place, and time. She has normal reflexes. No cranial nerve deficit. Coordination normal.  Psychiatric: She has a normal mood and affect. Her behavior is normal. Judgment and thought content normal.   Assessment & Plan:   See Encounters Tab for problem based charting.  Patient seen with Dr. Evette Doffing   -Gilberto Better, PGY1

## 2018-07-22 NOTE — Patient Instructions (Signed)
Cheyenne Fowler  Thank you for visiting the clinic. We are glad that you are here to establish care with [place]. We have reviewed your medications and labs and it appears you are currently doing well. We are giving you a tetanus shot as you are over-due and we recommend that you go in for colonoscopy but we know you are working on getting insurance so we will wait on that.   Gilberto Better, MD  Tdap Vaccine (Tetanus, Diphtheria and Pertussis): What You Need to Know 1. Why get vaccinated? Tetanus, diphtheria and pertussis are very serious diseases. Tdap vaccine can protect [place] from these diseases. And, Tdap vaccine given to pregnant women can protect newborn babies against pertussis. TETANUS (Lockjaw) is rare in the [place] today. It causes painful muscle tightening and stiffness, usually all over the body.  It can lead to tightening of muscles in the head and neck so you can't open your mouth, swallow, or sometimes even breathe. Tetanus kills about 1 out of 10 people who are infected even after receiving the best medical care.  DIPHTHERIA is also rare in the [place] today. It can cause a thick coating to form in the back of the throat.  It can lead to breathing problems, heart failure, paralysis, and death.  PERTUSSIS (Whooping Cough) causes severe coughing spells, which can cause difficulty breathing, vomiting and disturbed sleep.  It can also lead to weight loss, incontinence, and rib fractures. Up to 2 in 100 adolescents and 5 in 100 adults with pertussis are hospitalized or have complications, which could include pneumonia or death.  These diseases are caused by bacteria. Diphtheria and pertussis are spread from person to person through secretions from coughing or sneezing. Tetanus enters the body through cuts, scratches, or wounds. Before vaccines, as many as 200,000 cases of diphtheria, 200,000 cases of pertussis, and hundreds of cases of tetanus, were reported in the [place] each  year. Since vaccination began, reports of cases for tetanus and diphtheria have dropped by about 99% and for pertussis by about 80%. 2. Tdap vaccine Tdap vaccine can protect adolescents and adults from tetanus, diphtheria, and pertussis. One dose of Tdap is routinely given at age 76 or 61. People who did not get Tdap at that age should get it as soon as possible. Tdap is especially important for healthcare professionals and anyone having close contact with a baby younger than 12 months. Pregnant women should get a dose of Tdap during every pregnancy, to protect the newborn from pertussis. Infants are most at risk for severe, life-threatening complications from pertussis. Another vaccine, called Td, protects against tetanus and diphtheria, but not pertussis. A Td booster should be given every 10 years. Tdap may be given as one of these boosters if you have never gotten Tdap before. Tdap may also be given after a severe cut or burn to prevent tetanus infection. Your doctor or the person giving you the vaccine can give you more information. Tdap may safely be given at the same time as other vaccines. 3. Some people should not get this vaccine  A person who has ever had a life-threatening allergic reaction after a previous dose of any diphtheria, tetanus or pertussis containing vaccine, OR has a severe allergy to any part of this vaccine, should not get Tdap vaccine. Tell the person giving the vaccine about any severe allergies.  Anyone who had coma or long repeated seizures within 7 days after a childhood dose of DTP or DTaP, or a previous  dose of Tdap, should not get Tdap, unless a cause other than the vaccine was found. They can still get Td.  Talk to your doctor if you: ? have seizures or another nervous system problem, ? had severe pain or swelling after any vaccine containing diphtheria, tetanus or pertussis, ? ever had a condition called Guillain-Barr Syndrome (GBS), ? aren't feeling well on  the day the shot is scheduled. 4. Risks With any medicine, including vaccines, there is a chance of side effects. These are usually mild and go away on their own. Serious reactions are also possible but are rare. Most people who get Tdap vaccine do not have any problems with it. Mild problems following Tdap: (Did not interfere with activities)  Pain where the shot was given (about 3 in 4 adolescents or 2 in 3 adults)  Redness or swelling where the shot was given (about 1 person in 5)  Mild fever of at least 100.35F (up to about 1 in 25 adolescents or 1 in 100 adults)  Headache (about 3 or 4 people in 10)  Tiredness (about 1 person in 3 or 4)  Nausea, vomiting, diarrhea, stomach ache (up to 1 in 4 adolescents or 1 in 10 adults)  Chills, sore joints (about 1 person in 10)  Body aches (about 1 person in 3 or 4)  Rash, swollen glands (uncommon)  Moderate problems following Tdap: (Interfered with activities, but did not require medical attention)  Pain where the shot was given (up to 1 in 5 or 6)  Redness or swelling where the shot was given (up to about 1 in 16 adolescents or 1 in 12 adults)  Fever over 102F (about 1 in 100 adolescents or 1 in 250 adults)  Headache (about 1 in 7 adolescents or 1 in 10 adults)  Nausea, vomiting, diarrhea, stomach ache (up to 1 or 3 people in 100)  Swelling of the entire arm where the shot was given (up to about 1 in 500).  Severe problems following Tdap: (Unable to perform usual activities; required medical attention)  Swelling, severe pain, bleeding and redness in the arm where the shot was given (rare).  Problems that could happen after any vaccine:  People sometimes faint after a medical procedure, including vaccination. Sitting or lying down for about 15 minutes can help prevent fainting, and injuries caused by a fall. Tell your doctor if you feel dizzy, or have vision changes or ringing in the ears.  Some people get severe pain in  the shoulder and have difficulty moving the arm where a shot was given. This happens very rarely.  Any medication can cause a severe allergic reaction. Such reactions from a vaccine are very rare, estimated at fewer than 1 in a million doses, and would happen within a few minutes to a few hours after the vaccination. As with any medicine, there is a very remote chance of a vaccine causing a serious injury or death. The safety of vaccines is always being monitored. For more information, visit: http://www.aguilar.org/ 5. What if there is a serious problem? What should I look for? Look for anything that concerns you, such as signs of a severe allergic reaction, very high fever, or unusual behavior. Signs of a severe allergic reaction can include hives, swelling of the face and throat, difficulty breathing, a fast heartbeat, dizziness, and weakness. These would usually start a few minutes to a few hours after the vaccination. What should I do?  If you think it is a severe  allergic reaction or other emergency that can't wait, call 9-1-1 or get the person to the nearest hospital. Otherwise, call your doctor.  Afterward, the reaction should be reported to the Vaccine Adverse Event Reporting System (VAERS). Your doctor might file this report, or you can do it yourself through the VAERS web site at www.vaers.SamedayNews.es, or by calling 254-862-1921. ? VAERS does not give medical advice. 6. The National Vaccine Injury Compensation Program The [employer] Vaccine Injury Compensation Program (VICP) is a federal program that was created to compensate people who may have been injured by certain vaccines. Persons who believe they may have been injured by a vaccine can learn about the program and about filing a claim by calling 878-386-4158 or visiting the [place] website at GoldCloset.com.ee. There is a time limit to file a claim for compensation. 7. How can I learn more?  Ask your doctor. He or she  can give you the vaccine package insert or suggest other sources of information.  Call your local or state health department.  Contact the Centers for Disease Control and Prevention (CDC): ? Call 702-296-2930 (1-800-CDC-INFO) or ? Visit CDC's website at http://hunter.com/ CDC Tdap Vaccine VIS (02/15/14) This information is not intended to replace advice given to you by your health care provider. Make sure you discuss any questions you have with your health care provider. Document Released: 06/09/2012 Document Revised: 08/29/2016 Document Reviewed: 08/29/2016 Elsevier Interactive Patient Education  2017 [place].      Preventing Type 2 Diabetes Mellitus Type 2 diabetes (type 2 diabetes mellitus) is a long-term (chronic) disease that affects blood sugar (glucose) levels. Normally, a hormone called insulin allows glucose to enter cells in the body. The cells use glucose for energy. In type 2 diabetes, one or both of these problems may be present:  The body does not make enough insulin.  The body does not respond properly to insulin that it makes (insulin resistance).  Insulin resistance or lack of insulin causes excess glucose to build up in the blood instead of going into cells. As a result, high blood glucose (hyperglycemia) develops, which can cause many complications. Being overweight or obese and having an inactive (sedentary) lifestyle can increase your risk for diabetes. Type 2 diabetes can be delayed or prevented by making certain nutrition and lifestyle changes. What nutrition changes can be made?  Eat healthy meals and snacks regularly. Keep a healthy snack with you for when you get hungry between meals, such as fruit or a handful of nuts.  Eat lean meats and proteins that are low in saturated fats, such as chicken, fish, egg whites, and beans. Avoid processed meats.  Eat plenty of fruits and vegetables and plenty of grains that have not been processed (whole grains). It  is recommended that you eat: ? 1?2 cups of fruit every day. ? 2?3 cups of vegetables every day. ? 6?8 oz of whole grains every day, such as oats, whole wheat, bulgur, brown rice, quinoa, and millet.  Eat low-fat dairy products, such as milk, yogurt, and cheese.  Eat foods that contain healthy fats, such as nuts, avocado, olive oil, and canola oil.  Drink water throughout the day. Avoid drinks that contain added sugar, such as soda or sweet tea.  Follow instructions from your health care provider about specific eating or drinking restrictions.  Control how much food you eat at a time (portion size). ? Check food labels to find out the serving sizes of foods. ? Use a kitchen scale to weigh  amounts of foods.  Saute or steam food instead of frying it. Cook with water or broth instead of oils or butter.  Limit your intake of: ? Salt (sodium). Have no more than 1 tsp (2,400 mg) of sodium a day. If you have heart disease or high blood pressure, have less than ? tsp (1,500 mg) of sodium a day. ? Saturated fat. This is fat that is solid at room temperature, such as butter or fat on meat. What lifestyle changes can be made?  Activity  Do moderate-intensity physical activity for at least 30 minutes on at least 5 days of the week, or as much as told by your health care provider.  Ask your health care provider what activities are safe for you. A mix of physical activities may be best, such as walking, swimming, cycling, and strength training.  Try to add physical activity into your day. For example: ? Park in spots that are farther away than usual, so that you walk more. For example, park in a far corner of the parking lot when you go to the office or the grocery store. ? Take a walk during your lunch break. ? Use stairs instead of elevators or escalators. Weight Loss  Lose weight as directed. Your health care provider can determine how much weight loss is best for you and can help you  lose weight safely.  If you are overweight or obese, you may be instructed to lose at least 5?7 % of your body weight. Alcohol and Tobacco   Limit alcohol intake to no more than 1 drink a day for nonpregnant women and 2 drinks a day for men. One drink equals 12 oz of beer, 5 oz of wine, or 1 oz of hard liquor.  Do not use any tobacco products, such as cigarettes, chewing tobacco, and e-cigarettes. If you need help quitting, ask your health care provider. Work With [facility] Provider  Have your blood glucose tested regularly, as told by your health care provider.  Discuss your risk factors and how you can reduce your risk for diabetes.  Get screening tests as told by your health care provider. You may have screening tests regularly, especially if you have certain risk factors for type 2 diabetes.  Make an appointment with a diet and nutrition specialist (registered dietitian). A registered dietitian can help you make a healthy eating plan and can help you understand portion sizes and food labels. Why are these changes important?  It is possible to prevent or delay type 2 diabetes and related health problems by making lifestyle and nutrition changes.  It can be difficult to recognize signs of type 2 diabetes. The best way to avoid possible damage to your body is to take actions to prevent the disease before you develop symptoms. What can happen if changes are not made?  Your blood glucose levels may keep increasing. Having high blood glucose for a long time is dangerous. Too much glucose in your blood can damage your blood vessels, heart, kidneys, nerves, and eyes.  You may develop prediabetes or type 2 diabetes. Type 2 diabetes can lead to many chronic health problems and complications, such as: ? Heart disease. ? Stroke. ? Blindness. ? Kidney disease. ? Depression. ? Poor circulation in the feet and legs, which could lead to surgical removal (amputation) in severe  cases. Where to find support:  Ask your health care provider to recommend a registered dietitian, diabetes educator, or weight loss program.  Look for  local or online weight loss groups.  Join a gym, fitness club, or outdoor activity group, such as a walking club. Where to find more information: To learn more about diabetes and diabetes prevention, visit:  American Diabetes Association (ADA): www.diabetes.[employer] of Diabetes and Digestive and Kidney Diseases: FindSpin.nl  To learn more about healthy eating, visit:  The U.S. Department of Agriculture Scientist, research (physical sciences)), Choose My Plate: http://wiley-williams.com/  Office of Disease Prevention and Health Promotion (ODPHP), Dietary Guidelines: SurferLive.at  Summary  You can reduce your risk for type 2 diabetes by increasing your physical activity, eating healthy foods, and losing weight as directed.  Talk with your health care provider about your risk for type 2 diabetes. Ask about any blood tests or screening tests that you need to have. This information is not intended to replace advice given to you by your health care provider. Make sure you discuss any questions you have with your health care provider. Document Released: 04/01/2016 Document Revised: 05/16/2016 Document Reviewed: 01/30/2016 Elsevier Interactive Patient Education  [employer].

## 2018-07-23 ENCOUNTER — Encounter: Payer: Self-pay | Admitting: Internal Medicine

## 2018-07-23 DIAGNOSIS — Z23 Encounter for immunization: Secondary | ICD-10-CM | POA: Insufficient documentation

## 2018-07-23 DIAGNOSIS — K921 Melena: Secondary | ICD-10-CM | POA: Insufficient documentation

## 2018-07-23 NOTE — Assessment & Plan Note (Signed)
Refer to health maintenance tab

## 2018-07-23 NOTE — Assessment & Plan Note (Signed)
-   Record of positive blood in POC fecal occult test at [facility] - She is working on [employer] production planner - Overdue for screening colonoscopy - No family history - Denies diarrhea, abdominal pain, constipation, fatigue, shortness of breath, palpitations - Declined rectal exam  Fecal occult blood positive on screening exam - Will need colonoscopy, wait on insurance to get coverage

## 2018-07-23 NOTE — Assessment & Plan Note (Signed)
-   Mammogram ordered by [facility] program - Pap smear up to date - Due for colonoscopy (waiting for insurance) - Tdap overdue - Will get Tdap booster today

## 2018-07-23 NOTE — Assessment & Plan Note (Addendum)
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

## 2018-07-24 NOTE — Progress Notes (Signed)
Internal Medicine Clinic Attending  I saw and evaluated the patient.  I personally confirmed the key portions of the history and exam documented by Dr. Lee and I reviewed pertinent patient test results.  The assessment, diagnosis, and plan were formulated together and I agree with the documentation in the resident's note.  

## 2018-07-24 NOTE — Addendum Note (Signed)
Addended by: Lalla Brothers T on: 07/24/2018 08:11 AM   Modules accepted: Level of Service

## 2018-08-23 ENCOUNTER — Encounter (HOSPITAL_COMMUNITY): Payer: Self-pay | Admitting: Emergency Medicine

## 2018-08-23 ENCOUNTER — Emergency Department (HOSPITAL_COMMUNITY)
Admission: EM | Admit: 2018-08-23 | Discharge: 2018-08-23 | Disposition: A | Discharge status: Home / Self Care | Payer: Self-pay | Attending: Emergency Medicine | Admitting: Emergency Medicine

## 2018-08-23 ENCOUNTER — Other Ambulatory Visit: Payer: Self-pay

## 2018-08-23 DIAGNOSIS — K047 Periapical abscess without sinus: Secondary | ICD-10-CM | POA: Insufficient documentation

## 2018-08-23 DIAGNOSIS — F17210 Nicotine dependence, cigarettes, uncomplicated: Secondary | ICD-10-CM | POA: Insufficient documentation

## 2018-08-23 DIAGNOSIS — Z79899 Other long term (current) drug therapy: Secondary | ICD-10-CM | POA: Insufficient documentation

## 2018-08-23 DIAGNOSIS — K0889 Other specified disorders of teeth and supporting structures: Secondary | ICD-10-CM

## 2018-08-23 MED ORDER — CLINDAMYCIN HCL 300 MG PO CAPS: 300.0000 mg | ORAL_CAPSULE | Freq: Three times a day (TID) | ORAL | 0 refills | Status: DC

## 2018-08-23 MED ORDER — IBUPROFEN 800 MG PO TABS: 800.0000 mg | ORAL_TABLET | Freq: Three times a day (TID) | ORAL | 0 refills | Status: DC | PRN

## 2018-08-23 MED ORDER — HYDROCODONE-ACETAMINOPHEN 5-325 MG PO TABS
1.0000 | ORAL_TABLET | Freq: Once | ORAL | Status: AC
  Administered 2018-08-23: 1 via ORAL
  Filled 2018-08-23: qty 1

## 2018-08-23 MED ORDER — IBUPROFEN 800 MG PO TABS
800.0000 mg | ORAL_TABLET | Freq: Once | ORAL | Status: AC
  Administered 2018-08-23: 800 mg via ORAL
  Filled 2018-08-23: qty 1

## 2018-08-23 MED ORDER — TRAMADOL HCL 50 MG PO TABS: 50.0000 mg | ORAL_TABLET | Freq: Four times a day (QID) | ORAL | 0 refills | Status: DC | PRN

## 2018-08-23 NOTE — ED Triage Notes (Signed)
Pt states right upper wisdom tooth cavity for 1 month with pain and swelling. She does not have a dentist. No relief with OTC meds.

## 2018-08-23 NOTE — Discharge Instructions (Addendum)
Return here as needed.  Follow-up with a dentist as soon as possible.  Rinse with warm water and peroxide 3 times a day.  Use warm compresses over the area that is sore and swollen.

## 2018-08-23 NOTE — ED Provider Notes (Signed)
[facility] EMERGENCY DEPARTMENT Provider Note   CSN: [id number] Arrival date & time: 08/23/18  1252     History   Chief Complaint Chief Complaint  Patient presents with  . Dental Pain    HPI Cheyenne Fowler is a 52 y.o. female.  HPI Patient presents to the emergency department with dental pain that started 1 month ago.  The patient states that she has a cavity in the lower right wisdom tooth that is causing her significant pain.  Patient states that she does not see a dentist on a consistent basis.  Patient states that she is taken Tylenol and Motrin over-the-counter without significant relief of her symptoms.  Patient denies neck swelling, fever, weakness, difficulty swallowing, difficulty breathing, tongue swelling, nausea, vomiting, or syncope. Past Medical History:  Diagnosis Date  . Allergy   . Anemia   . Heart murmur     Patient Active Problem List   Diagnosis Date Noted  . Need for Tdap vaccination 07/23/2018  . Blood in stool 07/23/2018  . Diarrhea 07/14/2012  . Community acquired pneumonia 07/13/2012  . Chest pain 07/13/2012  . Depression 07/13/2012  . Hypokalemia 07/13/2012  . Tobacco abuse 07/13/2012  . Leukocytosis 07/13/2012  . Hyperglycemia 07/13/2012  . 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 Polysubstance abuse ([place]) 05/22/2012    Class: Acute  . Polysubstance (excluding opioids) dependence ([place]) 05/22/2012    Class: Acute  . Breast tenderness in female 03/18/2012  . Health care maintenance 03/18/2012  . DEPRESSION 08/17/2007  . HEEL PAIN, RIGHT 08/17/2007    Past Surgical History:  Procedure Laterality Date  . CESAREAN SECTION    . HERNIA REPAIR    . TUBAL LIGATION       OB History    Gravida  3   Para  3   Term  3   Preterm      AB  0   Living  3     SAB      TAB      Ectopic      Multiple      Live Births               Home Medications     Prior to Admission medications   Medication Sig Start Date End Date Taking? Authorizing Provider  diphenhydrAMINE (SOMINEX) 25 MG tablet Take 1 tablet (25 mg total) by mouth at bedtime as needed for allergies (Allergies). For allergies Patient not taking: Reported on 02/15/2016 05/25/12   Lindell Spar I, NP  hydrOXYzine (VISTARIL) 50 MG capsule Take 50 mg by mouth 2 (two) times daily.    [provider]  ibuprofen (ADVIL,MOTRIN) 600 MG tablet Take 1 tablet (600 mg total) by mouth every 6 (six) hours as needed. 04/01/17   Fatima Blank, MD  lamoTRIgine (LAMICTAL) 100 MG tablet Take 100 mg by mouth 2 (two) times daily.    [provider]  traZODone (DESYREL) 100 MG tablet Take 100 mg by mouth at bedtime.    [provider]    Family History Family History  Problem Relation Age of Onset  . Diabetes Mother   . Cancer Mother        liver  . Cancer Maternal Grandfather        throat  . Breast cancer Maternal Aunt   . Breast cancer Maternal Aunt        pancreatic  . Bipolar disorder Sister   .  Schizophrenia Brother     Social History Social History   Tobacco Use  . Smoking status: Current Every Day Smoker    Packs/day: 0.50    Years: 20.00    Pack years: 10.00    Types: Cigarettes  . Smokeless tobacco: Never Used  Substance Use Topics  . Alcohol use: Yes    Comment: Beer 80 oz  . Drug use: Yes    Types: Marijuana, Cocaine    Comment: last use cocaine 2 days ago     Allergies   Penicillins and Sulfonamide derivatives   Review of Systems Review of Systems   Physical Exam Updated Vital Signs BP (!) 160/101   Pulse 87   Temp 99.5 F (37.5 C)   Resp 17   LMP 05/23/2018   SpO2 94%   Physical Exam  Constitutional: She is oriented to person, place, and time. She appears well-developed and well-nourished. No distress.  HENT:  Head: Normocephalic and atraumatic.  Mouth/Throat: Oropharynx is clear and moist and mucous membranes are  normal. No trismus in the jaw. Dental abscesses and dental caries present. No uvula swelling. No posterior oropharyngeal edema or posterior oropharyngeal erythema.  Eyes: Pupils are equal, round, and reactive to light.  Pulmonary/Chest: Effort normal.  Neurological: She is alert and oriented to person, place, and time.  Skin: Skin is warm and dry.  Psychiatric: She has a normal mood and affect.  Nursing note and vitals reviewed.    ED Treatments / Results  Labs (all labs ordered are listed, but only abnormal results are displayed) Labs Reviewed - No data to display  EKG None  Radiology No results found.  Procedures Procedures (including critical care time)  Medications Ordered in ED Medications  HYDROcodone-acetaminophen (NORCO/VICODIN) 5-325 MG per tablet 1 tablet (has no administration in time range)  ibuprofen (ADVIL,MOTRIN) tablet 800 mg (has no administration in time range)     Initial Impression / Assessment and Plan / ED Course  I have reviewed the triage vital signs and the nursing notes.  Pertinent labs & imaging results that were available during my care of the patient were reviewed by me and considered in my medical decision making (see chart for details).     Patient be advised he will need follow-up with dentistry.  Told to return here as needed.  Patient agrees the plan and all questions were answered. She has no neck swelling or neck involvement.  Final Clinical Impressions(s) / ED Diagnoses   Final diagnoses:  None    ED Discharge Orders    None       Dalia Heading, PA-C 08/23/18 [place], [place], DO 08/23/18 1339

## 2018-08-25 ENCOUNTER — Ambulatory Visit: Payer: Self-pay

## 2018-08-27 ENCOUNTER — Ambulatory Visit (INDEPENDENT_AMBULATORY_CARE_PROVIDER_SITE_OTHER): Payer: Self-pay | Admitting: Internal Medicine

## 2018-08-27 ENCOUNTER — Encounter: Payer: Self-pay | Admitting: Internal Medicine

## 2018-08-27 ENCOUNTER — Other Ambulatory Visit: Payer: Self-pay

## 2018-08-27 DIAGNOSIS — R1310 Dysphagia, unspecified: Secondary | ICD-10-CM

## 2018-08-27 DIAGNOSIS — F17210 Nicotine dependence, cigarettes, uncomplicated: Secondary | ICD-10-CM

## 2018-08-27 DIAGNOSIS — Z791 Long term (current) use of non-steroidal anti-inflammatories (NSAID): Secondary | ICD-10-CM

## 2018-08-27 DIAGNOSIS — Z792 Long term (current) use of antibiotics: Secondary | ICD-10-CM

## 2018-08-27 DIAGNOSIS — K047 Periapical abscess without sinus: Secondary | ICD-10-CM | POA: Insufficient documentation

## 2018-08-27 DIAGNOSIS — R042 Hemoptysis: Secondary | ICD-10-CM

## 2018-08-27 NOTE — Assessment & Plan Note (Signed)
Patient endorses about 3 day history of hemoptysis of small clots in setting of coughing fits. She endorses some shortness of breath which immediately followed the coughing spells; also had subjective fevers at the time. She denies chest pain, previous h/o blood clots, or LE swelling.   Low risk for PE. She may have had bleeding from her infected tooth which presented as hemoptysis; in setting of fevers associated with the hemoptysis she may have had a PNA or bronchitis; she has a 30 pack yr smoking history so risk for malignancy is there as well.   Discussed with patient that next step would be CXR to assess for PNA, diffuse infiltrate (?pulm hemorrhage), nodule; she is unable to afford cost out of pocket at this time so she declines this today. She will f/u in clinic later this month and consider it then based on her finances. She is working on obtaining her orange card currently.

## 2018-08-27 NOTE — Progress Notes (Signed)
   CC: tooth pain  HPI:  [name] is a 52 y.o. with a PMH of tobacco use presents to clinic for tooth pain and hemoptysis.   Tooth pain: Patient endorses about 1wk history of right lower tooth pain with mild bloody drainage, swelling and tenderness. She does endorse mild dysphagia but is able to eat w/o difficulty. She denies fevers, chills, nausea, vomiting, shortness of breath. She was seen in the ED for this and prescribed Clindamycin 300mg  TID which she is taking; she was also given script for tramadol which she cannot afford and ibuprofen 800mg  which she is taking twice daily. She has minimal improvement in pain with ibuprofen. She has not seen a dentist in some time and currently does not have insurance.  Hemoptysis: Patient endorses about 3 day history of hemoptysis of small clots in setting of coughing fits. She endorses some shortness of breath which immediately followed the coughing spells; also had subjective fevers at the time. She denies chest pain, previous h/o blood clots, or LE swelling.   Please see problem based Assessment and Plan for status of patients chronic conditions.  Past Medical History:  Diagnosis Date  . Allergy   . Anemia   . Heart murmur     Review of Systems:   Per HPI.  Physical Exam:  Vitals:   08/27/18 0929  BP: (!) 144/94  Pulse: 77  Temp: 98.3 F (36.8 C)  TempSrc: Oral  SpO2: 99%  Weight: 199 lb (90.3 kg)  Height: 5\' 4"  (1.626 m)   GENERAL- alert, co-operative, appears as stated age, not in any distress. HEENT- partial broken RL molar with surrounding gum and buccal mucosa inflammation; no active drainage; no posterior pharyngeal swelling; TTP of entire right mandible and submandibular lymphadenopathy with mild swelling. CARDIAC- RRR, no murmurs, rubs or gallops. RESP- Moving equal volumes of air, and clear to auscultation bilaterally, no wheezes or crackles. EXTREMITIES- pulse 2+, symmetric, no LE edema.  Assessment & Plan:     See Encounters Tab for problem based charting.   Patient discussed with Dr. Andris Baumann, MD Internal Medicine PGY-3

## 2018-08-27 NOTE — Patient Instructions (Signed)
The best thing for your tooth would be to extract it; please call the listed dentist who will work on lower cost with you.

## 2018-08-27 NOTE — Assessment & Plan Note (Signed)
Patient with 1wk h/o right lower molar pain with swelling; she is able to tolerate PO intake and denies systemic symptoms at this time. Pain inadequately controlled with ibuprofen.   She will need tooth extraction.  Plan: --given list of local dentists who provide discounted services to get tooth extracted as soon as she can; she is still working on gathering all paperwork for orange card; when she does we could place referral for dentistry if she hasn't seen one yet --continue clindamycin --asked to increase ibuprofen to 800mg  TID; if inadequate advised to switch to OTC 400mg  naproxen BID

## 2018-09-01 NOTE — Progress Notes (Signed)
Case discussed with Dr. Svalina at the time of the visit.  We reviewed the resident's history and exam and pertinent patient test results.  I agree with the assessment, diagnosis and plan of care documented in the resident's note. 

## 2018-09-17 ENCOUNTER — Encounter: Payer: Self-pay | Admitting: Internal Medicine

## 2018-09-17 ENCOUNTER — Ambulatory Visit: Payer: Self-pay

## 2018-10-26 ENCOUNTER — Encounter: Payer: Self-pay | Admitting: Internal Medicine

## 2018-11-11 ENCOUNTER — Ambulatory Visit: Payer: Self-pay

## 2018-11-11 ENCOUNTER — Encounter: Payer: Self-pay | Admitting: Internal Medicine

## 2018-11-21 ENCOUNTER — Telehealth: Payer: Self-pay

## 2018-11-23 ENCOUNTER — Telehealth: Payer: Self-pay

## 2018-11-24 ENCOUNTER — Telehealth: Payer: Self-pay

## 2019-01-12 ENCOUNTER — Encounter (HOSPITAL_COMMUNITY): Payer: Self-pay

## 2019-01-12 ENCOUNTER — Emergency Department (HOSPITAL_COMMUNITY)
Admission: EM | Admit: 2019-01-12 | Discharge: 2019-01-12 | Disposition: A | Discharge status: Home / Self Care | Payer: Self-pay | Attending: Emergency Medicine | Admitting: Emergency Medicine

## 2019-01-12 DIAGNOSIS — F17210 Nicotine dependence, cigarettes, uncomplicated: Secondary | ICD-10-CM | POA: Insufficient documentation

## 2019-01-12 DIAGNOSIS — J111 Influenza due to unidentified influenza virus with other respiratory manifestations: Secondary | ICD-10-CM | POA: Insufficient documentation

## 2019-01-12 DIAGNOSIS — R69 Illness, unspecified: Secondary | ICD-10-CM

## 2019-01-12 DIAGNOSIS — Z79899 Other long term (current) drug therapy: Secondary | ICD-10-CM | POA: Insufficient documentation

## 2019-01-12 NOTE — Discharge Instructions (Addendum)
You appear to have influenza-like illness.  However, given the timeframe, there is no specific medication to decrease the time of the illness.  You should use Tylenol and ibuprofen for fever and body aches.  And drink plenty of fluids.  Return to the emergency department if you are unable to tolerate fluids, have severe headache and high fever, or become very short of breath.

## 2019-01-12 NOTE — ED Provider Notes (Signed)
[facility] DEPT Provider Note   CSN: [id number] Arrival date & time: 01/12/19  7616     History   Chief Complaint Chief Complaint  Patient presents with  . Generalized Body Aches  . chest congestion  . Sore Throat  . Headache    HPI Cheyenne Fowler is a 53 y.o. female.  HPI 53 yo female presents today complaining of body aches, congestion, fever , chills for 4 days.  Significant other with similar symptoms.  Patient also complains of sore throat, cough, nausea, but no vomiting, some loose stools.  TAking po but decreased due to poor appetite. Last cigarette yesterday morning.  Last alcohol Friday Last cocaine Saturday Past Medical History:  Diagnosis Date  . Allergy   . Anemia   . Heart murmur     Patient Active Problem List   Diagnosis Date Noted  . Tooth infection 08/27/2018  . Hemoptysis 08/27/2018  . Need for Tdap vaccination 07/23/2018  . Blood in stool 07/23/2018  . Depression 07/13/2012  . Tobacco abuse 07/13/2012  . 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 Polysubstance (excluding opioids) dependence ([place]) 05/22/2012    Class: Acute  . Breast tenderness in female 03/18/2012  . Health care maintenance 03/18/2012  . DEPRESSION 08/17/2007    Past Surgical History:  Procedure Laterality Date  . CESAREAN SECTION    . HERNIA REPAIR    . TUBAL LIGATION       OB History    Gravida  3   Para  3   Term  3   Preterm      AB  0   Living  3     SAB      TAB      Ectopic      Multiple      Live Births               Home Medications    Prior to Admission medications   Medication Sig Start Date End Date Taking? Authorizing Provider  clindamycin (CLEOCIN) 300 MG capsule Take 1 capsule (300 mg total) by mouth 3 (three) times daily. 08/23/18   Lawyer, Harrell Gave, PA-C  hydrOXYzine (VISTARIL) 50 MG capsule Take 50 mg by mouth 2 (two) times daily.    [provider]  ibuprofen (ADVIL,MOTRIN) 800 MG tablet Take 1 tablet (800 mg total) by mouth every 8 (eight) hours as needed. 08/23/18   Lawyer, Harrell Gave, PA-C  lamoTRIgine (LAMICTAL) 100 MG tablet Take 100 mg by mouth 2 (two) times daily.    [provider]  traMADol (ULTRAM) 50 MG tablet Take 1 tablet (50 mg total) by mouth every 6 (six) hours as needed for severe pain. Patient not taking: Reported on 08/27/2018 08/23/18   Dalia Heading, PA-C  traZODone (DESYREL) 100 MG tablet Take 100 mg by mouth at bedtime.    [provider]    Family History Family History  Problem Relation Age of Onset  . Diabetes Mother   . Cancer Mother        liver  . Cancer Maternal Grandfather        throat  . Breast cancer Maternal Aunt   . Breast cancer Maternal Aunt        pancreatic  . Bipolar disorder Sister   . Schizophrenia Brother     Social History Social History   Tobacco Use  . Smoking status: Current Every Day Smoker  Packs/day: 0.50    Years: 20.00    Pack years: 10.00    Types: Cigarettes  . Smokeless tobacco: Never Used  . Tobacco comment: little less   Substance Use Topics  . Alcohol use: Yes    Comment: Beer 80 oz; down to 40oz beer 3 times a week  . Drug use: Yes    Types: Marijuana, Cocaine    Comment: last use cocaine 2 days ago     Allergies   Penicillins and Sulfonamide derivatives   Review of Systems Review of Systems  All other systems reviewed and are negative.    Physical Exam Updated Vital Signs BP 114/78 (BP Location: Left Arm)   Pulse 84   Temp 98.7 F (37.1 C) (Oral)   Resp 15   SpO2 97%   Physical Exam Vitals signs and nursing note reviewed.  Constitutional:      Appearance: She is well-developed. She is obese.  HENT:     Head: Normocephalic and atraumatic.     Right Ear: Tympanic membrane normal.     Left Ear: Tympanic membrane normal.     Nose: Congestion present.     Mouth/Throat:     Mouth: Mucous membranes  are moist.     Tonsils: No tonsillar exudate.  Eyes:     Conjunctiva/sclera: Conjunctivae normal.     Pupils: Pupils are equal, round, and reactive to light.  Neck:     Musculoskeletal: Normal range of motion and neck supple.  Cardiovascular:     Rate and Rhythm: Normal rate and regular rhythm.     Heart sounds: Normal heart sounds.  Pulmonary:     Effort: Pulmonary effort is normal.     Breath sounds: Normal breath sounds.  Abdominal:     General: Bowel sounds are normal.     Palpations: Abdomen is soft.  Skin:    General: Skin is warm and dry.     Capillary Refill: Capillary refill takes less than 2 seconds.  Neurological:     General: No focal deficit present.     Mental Status: She is alert and oriented to person, place, and time.  Psychiatric:        Mood and Affect: Mood normal.        Behavior: Behavior normal.      ED Treatments / Results  Labs (all labs ordered are listed, but only abnormal results are displayed) Labs Reviewed - No data to display  EKG None  Radiology No results found.  Procedures Procedures (including critical care time)  Medications Ordered in ED Medications - No data to display   Initial Impression / Assessment and Plan / ED Course  I have reviewed the triage vital signs and the nursing notes.  Pertinent labs & imaging results that were available during my care of the patient were reviewed by me and considered in my medical decision making (see chart for details).    Patient presents today with 4 days of influenza-like illness.  Vital signs are stable.  She is taking p.o. without difficulty.  Vital signs are stable.  To continue symptomatic treatment.  Patient advised of return precautions need for follow-up and voiced understanding.   Final Clinical Impressions(s) / ED Diagnoses   Final diagnoses:  Influenza-like illness    ED Discharge Orders    None       Pattricia Boss, MD 01/12/19 [id number]

## 2019-01-12 NOTE — ED Triage Notes (Addendum)
Patient c/o generalized body aches,chest cold,headache, and sore throat. Patient also c/o right lower abdominal pain/ flank pain x2 weeks (aching, 7/10 pain)  Ambulatory in triage  A/Ox4  Husband in room 6 (Patient states husband tested positive for flu last week while in the hospital)

## 2019-02-02 ENCOUNTER — Other Ambulatory Visit: Payer: Self-pay

## 2019-02-02 ENCOUNTER — Emergency Department (HOSPITAL_COMMUNITY)
Admission: EM | Admit: 2019-02-02 | Discharge: 2019-02-02 | Disposition: A | Discharge status: Home / Self Care | Payer: Self-pay | Attending: Emergency Medicine | Admitting: Emergency Medicine

## 2019-02-02 DIAGNOSIS — F17210 Nicotine dependence, cigarettes, uncomplicated: Secondary | ICD-10-CM | POA: Insufficient documentation

## 2019-02-02 DIAGNOSIS — I10 Essential (primary) hypertension: Secondary | ICD-10-CM | POA: Insufficient documentation

## 2019-02-02 DIAGNOSIS — Z79899 Other long term (current) drug therapy: Secondary | ICD-10-CM | POA: Insufficient documentation

## 2019-02-02 DIAGNOSIS — N390 Urinary tract infection, site not specified: Secondary | ICD-10-CM | POA: Insufficient documentation

## 2019-02-02 LAB — URINALYSIS, ROUTINE W REFLEX MICROSCOPIC
Bilirubin Urine: NEGATIVE
Glucose, UA: NEGATIVE mg/dL
Hgb urine dipstick: NEGATIVE
KETONES UR: NEGATIVE mg/dL
NITRITE: NEGATIVE
PH: 6 (ref 5.0–8.0)
Protein, ur: NEGATIVE mg/dL
SPECIFIC GRAVITY, URINE: 1.014 (ref 1.005–1.030)
WBC, UA: >50 WBC/hpf — ABNORMAL HIGH (ref 0–5)

## 2019-02-02 MED ORDER — NITROFURANTOIN MONOHYD MACRO 100 MG PO CAPS: 100.0000 mg | ORAL_CAPSULE | Freq: Two times a day (BID) | ORAL | 0 refills | Status: DC

## 2019-02-02 NOTE — ED Notes (Signed)
Pt ambulated to bathroom 

## 2019-02-02 NOTE — ED Provider Notes (Signed)
[facility] DEPT Provider Note   CSN: [id number] Arrival date & time: 02/02/19  1023     History   Chief Complaint Chief Complaint  Patient presents with  . Dysuria    HPI Cheyenne Fowler is a 53 y.o. female who presents today for evaluation of dysuria for approximately 2 weeks.  She has tried cranberry juice and increased fluids which she says slightly helped however her symptoms continue to persist.  She denies any fevers.  No new back pain.  She denies nausea vomiting or diarrhea.  She denies any vaginal discharge or concern for STI.  She is postmenopausal.   HPI  Past Medical History:  Diagnosis Date  . Allergy   . Anemia   . Heart murmur     Patient Active Problem List   Diagnosis Date Noted  . Tooth infection 08/27/2018  . Hemoptysis 08/27/2018  . Need for Tdap vaccination 07/23/2018  . Blood in stool 07/23/2018  . Depression 07/13/2012  . Tobacco abuse 07/13/2012  . 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 Polysubstance (excluding opioids) dependence ([place]) 05/22/2012    Class: Acute  . Breast tenderness in female 03/18/2012  . Health care maintenance 03/18/2012  . DEPRESSION 08/17/2007    Past Surgical History:  Procedure Laterality Date  . CESAREAN SECTION    . HERNIA REPAIR    . TUBAL LIGATION       OB History    Gravida  3   Para  3   Term  3   Preterm      AB  0   Living  3     SAB      TAB      Ectopic      Multiple      Live Births               Home Medications    Prior to Admission medications   Medication Sig Start Date End Date Taking? Authorizing Provider  clindamycin (CLEOCIN) 300 MG capsule Take 1 capsule (300 mg total) by mouth 3 (three) times daily. 08/23/18   Lawyer, Harrell Gave, PA-C  hydrOXYzine (VISTARIL) 50 MG capsule Take 50 mg by mouth 2 (two) times daily.    [provider]  ibuprofen (ADVIL,MOTRIN) 800 MG tablet  Take 1 tablet (800 mg total) by mouth every 8 (eight) hours as needed. 08/23/18   Lawyer, Harrell Gave, PA-C  lamoTRIgine (LAMICTAL) 100 MG tablet Take 100 mg by mouth 2 (two) times daily.    [provider]  nitrofurantoin, macrocrystal-monohydrate, (MACROBID) 100 MG capsule Take 1 capsule (100 mg total) by mouth 2 (two) times daily. 02/02/19   Lorin Glass, PA-C  traMADol (ULTRAM) 50 MG tablet Take 1 tablet (50 mg total) by mouth every 6 (six) hours as needed for severe pain. Patient not taking: Reported on 08/27/2018 08/23/18   Dalia Heading, PA-C  traZODone (DESYREL) 100 MG tablet Take 100 mg by mouth at bedtime.    [provider]    Family History Family History  Problem Relation Age of Onset  . Diabetes Mother   . Cancer Mother        liver  . Cancer Maternal Grandfather        throat  . Breast cancer Maternal Aunt   . Breast cancer Maternal Aunt        pancreatic  . Bipolar disorder Sister   . Schizophrenia Brother  Social History Social History   Tobacco Use  . Smoking status: Current Every Day Smoker    Packs/day: 0.50    Years: 20.00    Pack years: 10.00    Types: Cigarettes  . Smokeless tobacco: Never Used  . Tobacco comment: little less   Substance Use Topics  . Alcohol use: Yes    Comment: Beer 80 oz; down to 40oz beer 3 times a week  . Drug use: Yes    Types: Marijuana, Cocaine    Comment: last use cocaine 2 days ago     Allergies   Penicillins and Sulfonamide derivatives   Review of Systems Review of Systems  Constitutional: Negative for chills and fever.  Respiratory: Negative for chest tightness and shortness of breath.   Gastrointestinal: Negative for abdominal pain.  Genitourinary: Positive for dysuria, frequency and hematuria. Negative for decreased urine volume, difficulty urinating, flank pain, pelvic pain, urgency, vaginal bleeding, vaginal discharge and vaginal pain.  Musculoskeletal: Negative for back pain.    All other systems reviewed and are negative.    Physical Exam Updated Vital Signs BP 127/86   Pulse 81   Temp 98.4 F (36.9 C) (Oral)   Resp 16   LMP 06/09/2018 (Exact Date)   SpO2 95%   Physical Exam Vitals signs and nursing note reviewed.  Constitutional:      General: She is not in acute distress.    Appearance: She is well-developed.  HENT:     Head: Normocephalic and atraumatic.     Mouth/Throat:     Mouth: Mucous membranes are moist.  Eyes:     Conjunctiva/sclera: Conjunctivae normal.  Neck:     Musculoskeletal: Normal range of motion and neck supple.  Cardiovascular:     Rate and Rhythm: Normal rate and regular rhythm.     Pulses: Normal pulses.     Heart sounds: Normal heart sounds. No murmur.  Pulmonary:     Effort: Pulmonary effort is normal. No respiratory distress.     Breath sounds: Normal breath sounds.  Abdominal:     General: Abdomen is flat. Bowel sounds are normal. There is no distension.     Palpations: Abdomen is soft.     Tenderness: There is no abdominal tenderness. There is no right CVA tenderness or left CVA tenderness.     Hernia: No hernia is present.  Skin:    General: Skin is warm and dry.  Neurological:     General: No focal deficit present.     Mental Status: She is alert.  Psychiatric:        Mood and Affect: Mood normal.        Behavior: Behavior normal.      ED Treatments / Results  Labs (all labs ordered are listed, but only abnormal results are displayed) Labs Reviewed  URINALYSIS, ROUTINE W REFLEX MICROSCOPIC - Abnormal; Notable for the following components:      Result Value   Color, Urine YELLOW (*)    Leukocytes,Ua LARGE (*)    WBC, UA >50 (*)    Bacteria, UA RARE (*)    All other components within normal limits  URINE CULTURE    EKG None  Radiology No results found.  Procedures Procedures (including critical care time)  Medications Ordered in ED Medications - No data to display   Initial Impression  / Assessment and Plan / ED Course  I have reviewed the triage vital signs and the nursing notes.  Pertinent labs & imaging results  that were available during my care of the patient were reviewed by me and considered in my medical decision making (see chart for details).    Pt has been diagnosed with a UTI. Pt is afebrile, no CVA tenderness, slightly hypertensive, recommended DASH diet, and denies N/V. Pt to be dc home with antibiotics and instructions to follow up with PCP if symptoms persist.    Final Clinical Impressions(s) / ED Diagnoses   Final diagnoses:  Lower urinary tract infectious disease  Hypertension, unspecified type    ED Discharge Orders         Ordered    nitrofurantoin, macrocrystal-monohydrate, (MACROBID) 100 MG capsule  2 times daily     02/02/19 1219           Lorin Glass, [place] 02/02/19 1631    Hayden Rasmussen, MD 02/02/19 1749

## 2019-02-02 NOTE — ED Triage Notes (Signed)
Pt reports pain with urination over the past two weeks, denies blood in urine.

## 2019-02-02 NOTE — ED Notes (Signed)
Pt encouraged to provide urine sample.

## 2019-02-02 NOTE — Discharge Instructions (Addendum)
As we discussed today your symptoms are most likely from a urinary tract infection.  I have sent a prescription for this to your preferred pharmacy.  You may get Azo which is available over-the-counter to help with urinary discomfort.  Be advised this will cause your urine to change colors.  If your symptoms worsen or you have any concerns please seek additional medical care and evaluation.

## 2019-02-04 LAB — URINE CULTURE

## 2019-02-05 ENCOUNTER — Telehealth: Payer: Self-pay | Admitting: *Deleted

## 2019-02-05 NOTE — Telephone Encounter (Signed)
Post ED Visit - Positive Culture Follow-up  Culture report reviewed by antimicrobial stewardship pharmacist:  []  Elenor Quinones, Pharm.D. []  Heide Guile, Pharm.D., BCPS AQ-ID []  Parks Neptune, Pharm.D., BCPS []  Alycia Rossetti, Pharm.D., BCPS []  [place], Pharm.D., BCPS, AAHIVP []  Legrand Como, Pharm.D., BCPS, AAHIVP []  Salome Arnt, PharmD, BCPS []  Johnnette Gourd, PharmD, BCPS []  Hughes Better, PharmD, BCPS []  Leeroy Cha, PharmD Laqueta Linden, PharmD  Positive urine culture Treated with [facility] Macro, organism sensitive to the same and no further patient follow-up is required at this time.  Harlon Flor [facility] 02/05/2019, 12:37 PM

## 2019-08-30 IMAGING — US ULTRASOUND RIGHT BREAST LIMITED
1 series · 5 of 5 positions shown · non-contrast
Comparison: Previous exam(s).

CLINICAL DATA: Bilateral focal breast pain felt by the patient for
approximately 2 months.

EXAM:
DIGITAL DIAGNOSTIC BILATERAL MAMMOGRAM WITH CAD AND TOMO
ULTRASOUND BILATERAL BREAST

[Series 1: ultrasound right breast limited · 0.07mm/px · 5 of 5 slices shown]
[im 1/5]
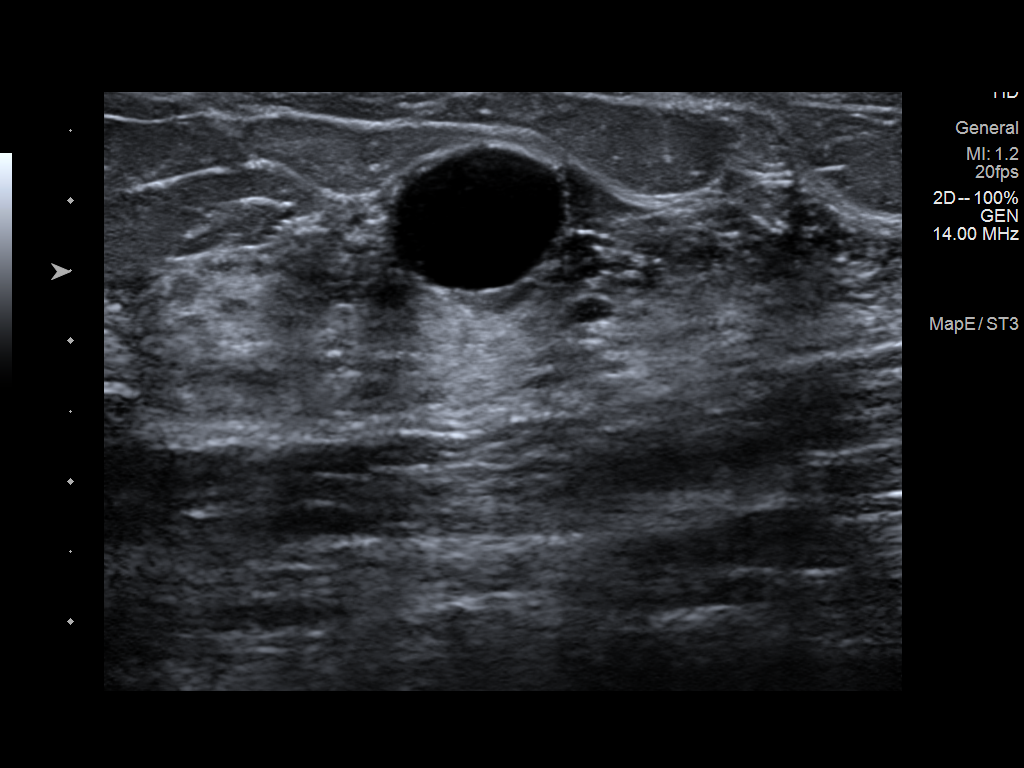
[im 2/5]
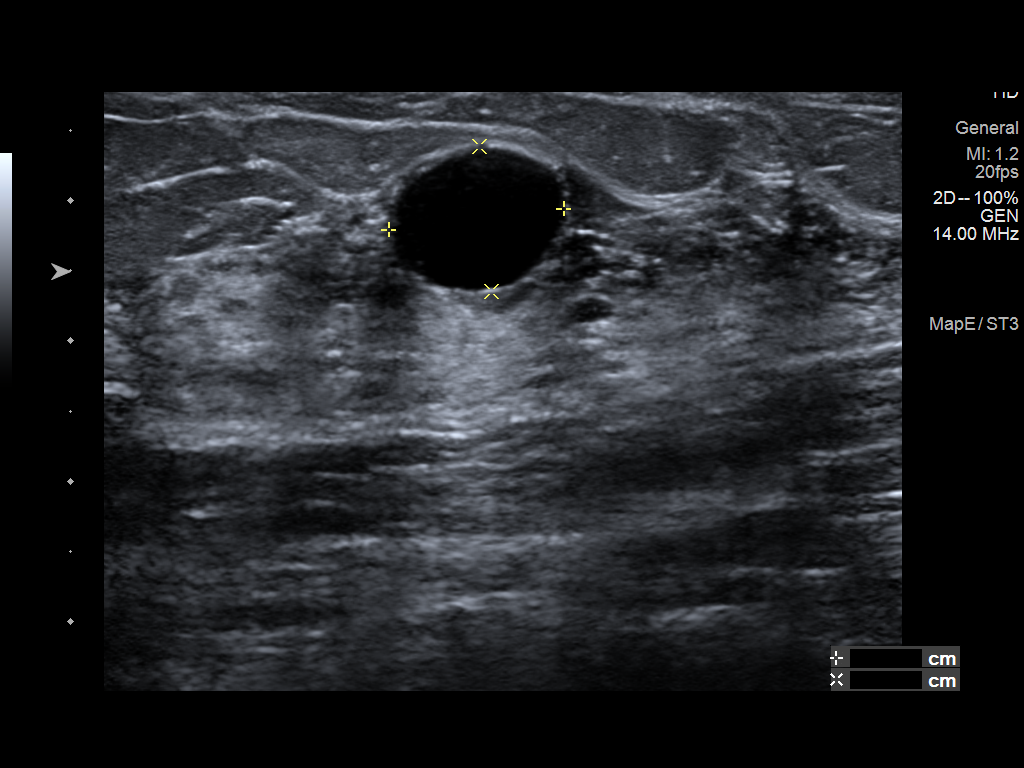
[im 3/5]
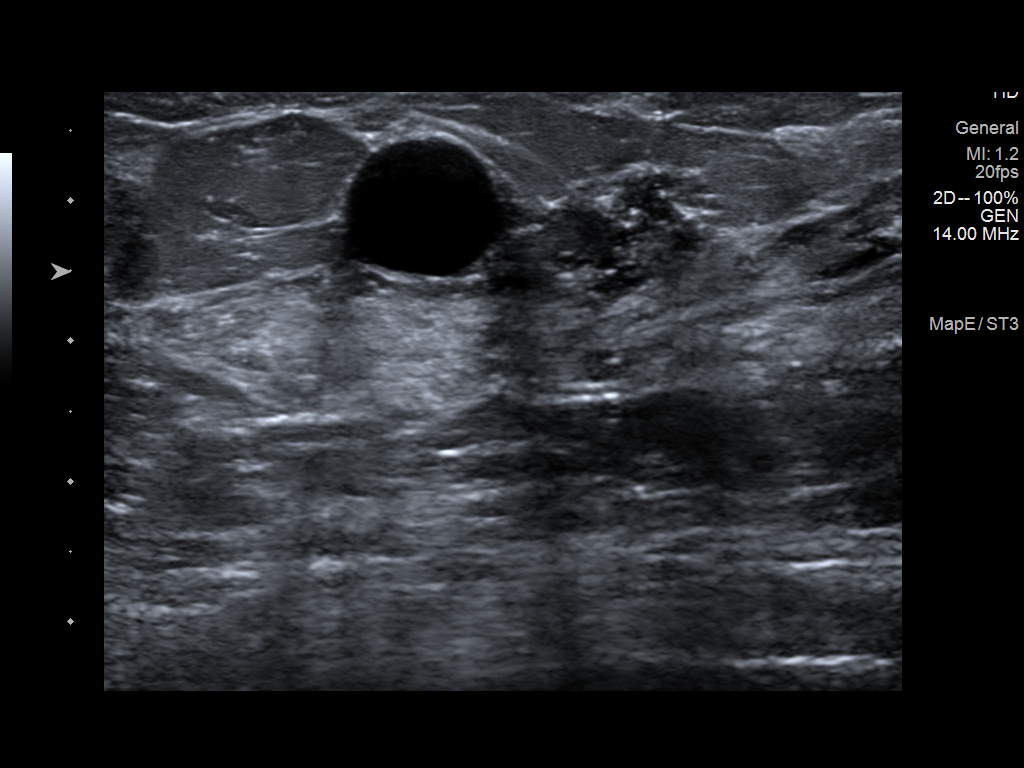
[im 4/5]
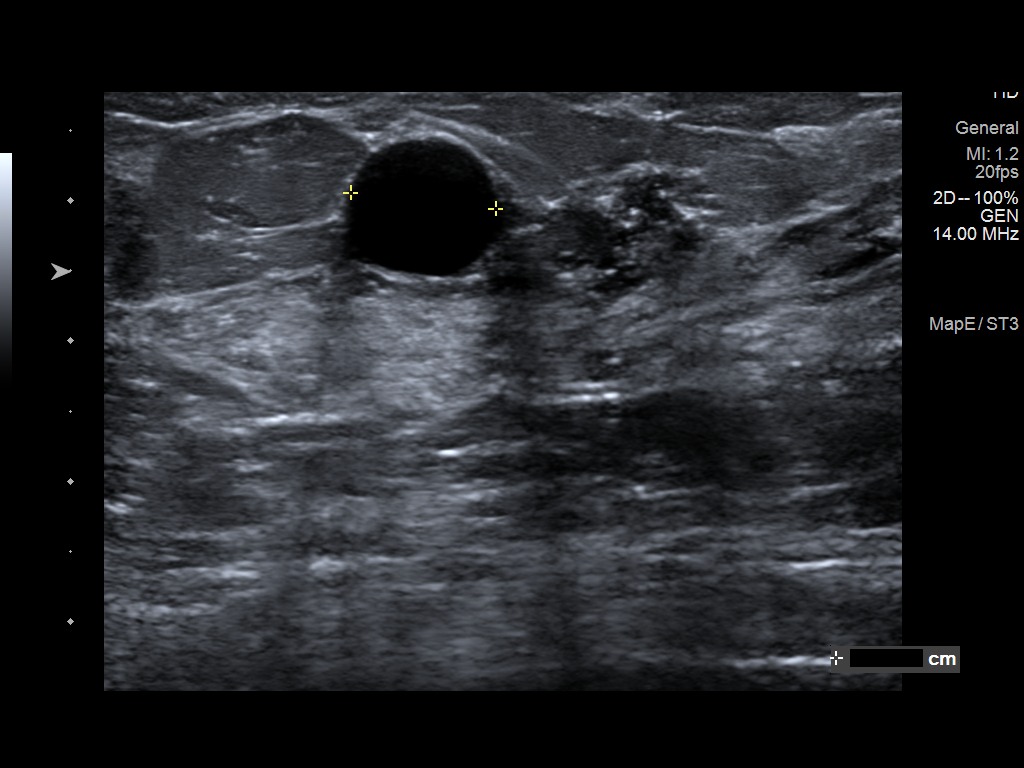
[im 5/5]
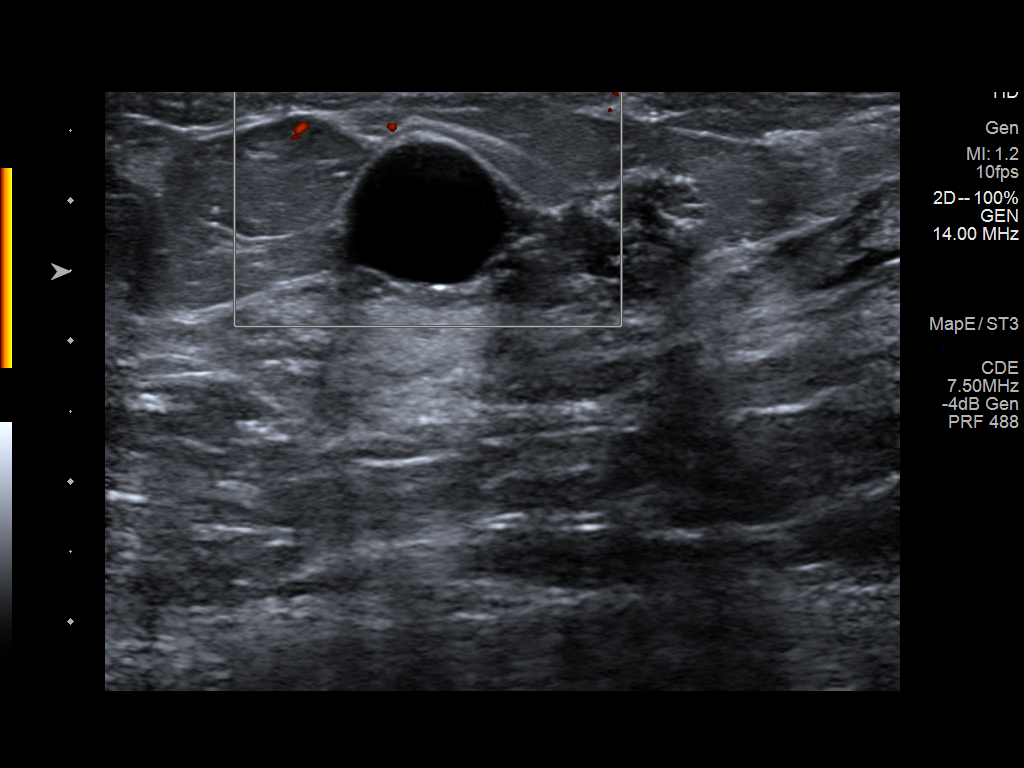

[5 of 5 positions shown; findings below may reference images not displayed]

ACR Breast Density Category c: The breast tissue is heterogeneously
dense, which may obscure small masses.
FINDINGS: Mammographically, there are no suspicious masses, areas of
architectural distortion or microcalcifications in either breast.
There is a circumscribed benign-appearing mass in the right breast
upper outer quadrant, middle depth.

Mammographic images were processed with CAD.

On physical exam, no suspicious masses are palpated.

Targeted right breast ultrasound is performed, showing 10 o'clock 5
cm from the nipple benign-appearing cyst measuring 1.3 x 1.0 x
cm. This finding corresponds to the mass seen mammographically. No
sonographic correlation to the area of focal pain in the lower
central right breast.

Targeted left breast ultrasound is performed, showing no suspicious
masses or shadowing lesions. No sonographic correlation to area of
pain in the left breast upper inner quadrant.
IMPRESSION: No mammographic or sonographic evidence of malignancy in either
breast.

Benign-appearing right breast cyst.

RECOMMENDATION:
Further management of patient's bilateral breast pain should be
based on clinical grounds.

Otherwise, screening mammogram in one year.(Code:TQ-6-200)

I have discussed the findings and recommendations with the patient.
Results were also provided in writing at the conclusion of the
visit. If applicable, a reminder letter will be sent to the patient
regarding the next appointment.

BI-RADS CATEGORY  2: Benign.

## 2019-11-12 ENCOUNTER — Telehealth (HOSPITAL_COMMUNITY): Payer: Self-pay

## 2019-11-12 NOTE — Telephone Encounter (Signed)
Telephoned patient at home number.  Mailbox full, unable to leave a message from [facility].

## 2020-01-11 ENCOUNTER — Other Ambulatory Visit: Payer: Self-pay

## 2020-01-11 ENCOUNTER — Emergency Department (HOSPITAL_COMMUNITY)
Admission: EM | Admit: 2020-01-11 | Discharge: 2020-01-11 | Disposition: A | Discharge status: Home / Self Care | Payer: No Typology Code available for payment source | Attending: Emergency Medicine | Admitting: Emergency Medicine

## 2020-01-11 ENCOUNTER — Encounter (HOSPITAL_COMMUNITY): Payer: Self-pay | Admitting: Emergency Medicine

## 2020-01-11 ENCOUNTER — Emergency Department (HOSPITAL_COMMUNITY): Payer: No Typology Code available for payment source

## 2020-01-11 DIAGNOSIS — R0602 Shortness of breath: Secondary | ICD-10-CM | POA: Diagnosis not present

## 2020-01-11 DIAGNOSIS — F17210 Nicotine dependence, cigarettes, uncomplicated: Secondary | ICD-10-CM | POA: Insufficient documentation

## 2020-01-11 DIAGNOSIS — M542 Cervicalgia: Secondary | ICD-10-CM | POA: Diagnosis not present

## 2020-01-11 DIAGNOSIS — Z79899 Other long term (current) drug therapy: Secondary | ICD-10-CM | POA: Insufficient documentation

## 2020-01-11 DIAGNOSIS — F1210 Cannabis abuse, uncomplicated: Secondary | ICD-10-CM | POA: Insufficient documentation

## 2020-01-11 DIAGNOSIS — M25511 Pain in right shoulder: Secondary | ICD-10-CM | POA: Diagnosis not present

## 2020-01-11 DIAGNOSIS — R519 Headache, unspecified: Secondary | ICD-10-CM | POA: Insufficient documentation

## 2020-01-11 DIAGNOSIS — R0789 Other chest pain: Secondary | ICD-10-CM | POA: Insufficient documentation

## 2020-01-11 DIAGNOSIS — M545 Low back pain, unspecified: Secondary | ICD-10-CM

## 2020-01-11 DIAGNOSIS — F1410 Cocaine abuse, uncomplicated: Secondary | ICD-10-CM | POA: Diagnosis not present

## 2020-01-11 MED ORDER — MELOXICAM 15 MG PO TABS: 7.5000 mg | ORAL_TABLET | Freq: Every day | ORAL | 0 refills | Status: DC

## 2020-01-11 MED ORDER — LIDOCAINE 5 % EX PTCH
3.0000 | MEDICATED_PATCH | CUTANEOUS | Status: DC
  Administered 2020-01-11: 3 via TRANSDERMAL
  Filled 2020-01-11: qty 3

## 2020-01-11 MED ORDER — HYDROCODONE-ACETAMINOPHEN 5-325 MG PO TABS
1.0000 | ORAL_TABLET | Freq: Once | ORAL | Status: AC
  Administered 2020-01-11: 1 via ORAL
  Filled 2020-01-11: qty 1

## 2020-01-11 MED ORDER — METHOCARBAMOL 500 MG PO TABS: 500.0000 mg | ORAL_TABLET | Freq: Two times a day (BID) | ORAL | 0 refills | Status: DC

## 2020-01-11 MED ORDER — MELOXICAM 15 MG PO TABS: 15.0000 mg | ORAL_TABLET | Freq: Every day | ORAL | 0 refills | Status: DC

## 2020-01-11 NOTE — Discharge Instructions (Addendum)
As discussed, all your films were normal today. 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 I want you to [facility]-up with [facility] to recheck your symptoms. As discussed, your symptoms will get worse before they get better. I am sending you home with a pain medication called meloxicam. You can take it once daily. Do not mix with other over the counter medications. I am also sending you home with a muscle relaxer called Robaxin. Medicine can cause drowsiness, so do not drive or operate machinery while on the medication. You may purchase Voltaren gel and Lidoderm patches over the counter. Continue icing the areas of pain. Follow-up within the next with your PCP. Return to the ER for new and worsening symptoms.
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
## 2020-01-11 NOTE — ED Notes (Signed)
Patient verbalizes understanding of discharge instructions. Opportunity for questioning and answers were provided. Armband removed by staff, pt discharged from ED.  

## 2020-01-11 NOTE — ED Notes (Signed)
Patient transported to CT 
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
## 2020-04-20 ENCOUNTER — Ambulatory Visit: Payer: Medicaid Other | Admitting: Internal Medicine

## 2020-04-20 ENCOUNTER — Encounter: Payer: Self-pay | Admitting: Internal Medicine

## 2020-04-20 ENCOUNTER — Other Ambulatory Visit: Payer: Self-pay

## 2020-04-20 VITALS — BP 124/75 | HR 92 | Resp 14 | Ht 62.5 in | Wt 215.0 lb

## 2020-04-20 DIAGNOSIS — F319 Bipolar disorder, unspecified: Secondary | ICD-10-CM | POA: Insufficient documentation

## 2020-04-20 DIAGNOSIS — N951 Menopausal and female climacteric states: Secondary | ICD-10-CM

## 2020-04-20 DIAGNOSIS — F1920 Other psychoactive substance dependence, uncomplicated: Secondary | ICD-10-CM

## 2020-04-20 DIAGNOSIS — R7303 Prediabetes: Secondary | ICD-10-CM | POA: Insufficient documentation

## 2020-04-20 DIAGNOSIS — Z1231 Encounter for screening mammogram for malignant neoplasm of breast: Secondary | ICD-10-CM

## 2020-04-20 NOTE — Progress Notes (Addendum)
Subjective:    Patient ID: Cheyenne Fowler, female   DOB: [birth year], 54 y.o.   MRN: [id number]   HPI   Here to establish  1.  Night sweats/hot flashes which leads to poor sleep:  Problem for 4 years.  Has tried something over the counter, but did not help.  Cannot say what it was.    2.  Bipolar Disorder:  Followed at [facility].  Danae Chen is her counselor.  Not clear if a Letta Moynahan writes the prescription. Currently taking Lamotrigine, Seroquel and hydroxyzine.  Has Trazodone for sleep, but feels drugged in morning if takes that an Seroquel.    3.  History of prediabetes:  Diagnosed 2 days ago.    4.  Drug and alcohol abuse:  Tends to use crack cocaine, MJ, alcohol.  Alcohol is her drug of choice.   She would be interested in any counseling for this  5.  Urinary stress incontinence for years:  Has never tried Kegel exercises.  Loses control with coughing, laughing, sneezing.  Also describes an element of urge incontinence when she has to go.   6.  COVID19 vaccination: Has not been vaccinated.  Current Meds  Medication Sig  . cetirizine (ZYRTEC) 10 MG tablet Take 10 mg by mouth daily.  . hydrOXYzine (VISTARIL) 50 MG capsule Take 50 mg by mouth 3 (three) times daily as needed.  . lamoTRIgine (LAMICTAL) 25 MG tablet Take 25 mg by mouth daily. 4 tabs by mouth daily  . QUEtiapine (SEROQUEL) 200 MG tablet Take 200 mg by mouth at bedtime.  . traZODone (DESYREL) 100 MG tablet Take 100 mg by mouth at bedtime.    Allergies  Allergen Reactions  . Penicillins     REACTION: hives  . Sulfonamide Derivatives     REACTION: hives   Past Medical History:  Diagnosis Date  . Anemia   . Bipolar disorder ([place])   . Environmental and seasonal allergies 1968  . Heart murmur   . Prediabetes     Past Surgical History:  Procedure Laterality Date  . [place]  . HERNIA REPAIR     Umbilical hernia in the 2nd grade  . TUBAL LIGATION  1990    Family History  Problem  Relation Age of Onset  . Diabetes Mother   . Cancer Mother        liver--not clear if this was the primary  . COPD Mother   . Cancer Maternal Grandfather        throat  . Schizophrenia Brother   . Bipolar disorder Brother   . Cancer Brother        Not sure of primary  . Breast cancer Maternal Aunt        Cause of death  . Cancer Maternal Aunt        pancreatic  . Bipolar disorder Sister   . Diabetes Sister     Social History   Socioeconomic History  . Marital status: Married    Spouse name: [name]  . Number of children: 3  . Years of education: Not on file  . Highest education level: GED or equivalent  Occupational History  . 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 Smoking status: Current Every Day Smoker    Packs/day: 0.50    Years: 29.00    Pack years: 14.50    Types: Cigarettes  . Smokeless tobacco: Never Used  Substance and Sexual Activity  .  Alcohol use: Yes    Comment: 4 times weekly:  40 oz  . Drug use: Yes    Types: Marijuana, "Crack" cocaine    Comment: Last cocaine use was 30 days ago.  No MJ for 2 months.  . Sexual activity: Yes    Birth control/protection: Surgical  Other Topics Concern  . Not on file  Social History Narrative   She and her husband liver with her son and helping out with her granddaughter.    Born and raised in [place] Strain:   . Difficulty of Paying Living Expenses:   Food Insecurity:   . Worried About Charity fundraiser in the Last Year:   . Arboriculturist in the Last Year:   Transportation Needs: No Transportation Needs  . Lack of Transportation (Medical): No  . Lack of Transportation (Non-Medical): No  Physical Activity:   . Days of Exercise per Week:   . Minutes of Exercise per Session:   Stress:   . Feeling of Stress :   Social Connections:   . Frequency of Communication with Friends and Family:   . Frequency of Social  Gatherings with Friends and Family:   . Attends Religious Services:   . Active Member of Clubs or Organizations:   . Attends Archivist Meetings:   Marland Kitchen Marital Status:   Intimate Partner Violence:   . Fear of Current or Ex-Partner:   . Emotionally Abused:   Marland Kitchen Physically Abused:   . Sexually Abused:        Review of Systems    Objective:   LMP 06/09/2018 (Exact Date)    Blood pressure 124/75, pulse 92, resp. rate 14, height 5' 2.5" (1.588 m), weight 215 lb (97.5 kg), last menstrual period 06/09/2018. BMI:  38.70 kg/m2   Physical Exam  Obese HEENT:  PERRL, EOMI Neck:  Supple, No adenopathy, no thyromegaly Chest:  CTA CV:  RRR with normal S1 and S2, No S3, S4 or murmur.  Radial and DP pulses normal and equal. Abd:  S, NT, No HSM or mass, + BS LE:  No edema   Assessment & Plan   1.  Menopausal symptoms:  Encouraged trying Black Cohosh from [facility].  Regular physical activity and keeping home on cooler side as well.  2.  Substance abuse:  Referral to our LCSW/LCAS-A, Dwan Bolt to work on recovery. Again, uses mainly when runs out of medications to treat Bipolar Disorder.  3.  Prediabetes:  Discussed diet and daily physical activity/lifestyle changes  4.  Urinary stress incontinence:  Discussed Kegel exercises and to perform twice daily for 10 minutes--even when in car.  5.  HM:  Mammogram.  Recommended coming in next Monday for COVID vaccine.  Fasting labs in 1 week.

## 2020-04-20 NOTE — Patient Instructions (Signed)
[facility] [address]  [place], [place] [address] [phone] 40% Discount for [facility] Patients  Needs Black Cohosh  Don't forget to come for COVID vaccine on Monday, May 3rd between 9 a.m. and 1:30 p.m.  Someone will call to set up the appt

## 2020-04-26 ENCOUNTER — Other Ambulatory Visit: Payer: Self-pay | Admitting: Internal Medicine

## 2020-04-26 ENCOUNTER — Ambulatory Visit: Payer: Self-pay | Admitting: Internal Medicine

## 2020-04-26 DIAGNOSIS — Z1231 Encounter for screening mammogram for malignant neoplasm of breast: Secondary | ICD-10-CM

## 2020-04-27 ENCOUNTER — Other Ambulatory Visit: Payer: Self-pay

## 2020-04-27 ENCOUNTER — Other Ambulatory Visit (INDEPENDENT_AMBULATORY_CARE_PROVIDER_SITE_OTHER): Payer: Self-pay

## 2020-04-27 DIAGNOSIS — Z79899 Other long term (current) drug therapy: Secondary | ICD-10-CM

## 2020-04-27 DIAGNOSIS — Z13220 Encounter for screening for lipoid disorders: Secondary | ICD-10-CM

## 2020-04-27 DIAGNOSIS — R7303 Prediabetes: Secondary | ICD-10-CM

## 2020-04-28 ENCOUNTER — Other Ambulatory Visit: Payer: Self-pay

## 2020-04-28 DIAGNOSIS — Z1231 Encounter for screening mammogram for malignant neoplasm of breast: Secondary | ICD-10-CM

## 2020-04-28 LAB — CBC WITH DIFFERENTIAL/PLATELET
Basophils Absolute: 0.1 10*3/uL (ref 0.0–0.2)
Basos: 1 %
EOS (ABSOLUTE): 0.2 10*3/uL (ref 0.0–0.4)
Eos: 2 %
Hematocrit: 42.9 % (ref 34.0–46.6)
Hemoglobin: 14.3 g/dL (ref 11.1–15.9)
Immature Grans (Abs): 0.1 10*3/uL (ref 0.0–0.1)
Immature Granulocytes: 1 %
Lymphocytes Absolute: 3.9 10*3/uL — ABNORMAL HIGH (ref 0.7–3.1)
Lymphs: 42 %
MCH: 29 pg (ref 26.6–33.0)
MCHC: 33.3 g/dL (ref 31.5–35.7)
MCV: 87 fL (ref 79–97)
Monocytes Absolute: 0.8 10*3/uL (ref 0.1–0.9)
Monocytes: 8 %
Neutrophils Absolute: 4.5 10*3/uL (ref 1.4–7.0)
Neutrophils: 46 %
Platelets: 280 10*3/uL (ref 150–450)
RBC: 4.93 x10E6/uL (ref 3.77–5.28)
RDW: 12.7 % (ref 11.7–15.4)
WBC: 9.4 10*3/uL (ref 3.4–10.8)

## 2020-04-28 LAB — COMPREHENSIVE METABOLIC PANEL
ALT: 39 IU/L — ABNORMAL HIGH (ref 0–32)
AST: 25 IU/L (ref 0–40)
Albumin/Globulin Ratio: 1.9 (ref 1.2–2.2)
Albumin: 4.2 g/dL (ref 3.8–4.9)
Alkaline Phosphatase: 107 IU/L (ref 39–117)
BUN/Creatinine Ratio: 9 (ref 9–23)
BUN: 8 mg/dL (ref 6–24)
Bilirubin Total: 0.3 mg/dL (ref 0.0–1.2)
CO2: 24 mmol/L (ref 20–29)
Calcium: 9.4 mg/dL (ref 8.7–10.2)
Chloride: 104 mmol/L (ref 96–106)
Creatinine, Ser: 0.88 mg/dL (ref 0.57–1.00)
GFR calc Af Amer: 86 mL/min/{1.73_m2} (ref >59)
GFR calc non Af Amer: 75 mL/min/{1.73_m2} (ref >59)
Globulin, Total: 2.2 g/dL (ref 1.5–4.5)
Glucose: 103 mg/dL — ABNORMAL HIGH (ref 65–99)
Potassium: 4.8 mmol/L (ref 3.5–5.2)
Sodium: 140 mmol/L (ref 134–144)
Total Protein: 6.4 g/dL (ref 6.0–8.5)

## 2020-04-28 LAB — LIPID PANEL W/O CHOL/HDL RATIO
Cholesterol, Total: 187 mg/dL (ref 100–199)
HDL: 42 mg/dL (ref >39)
LDL Chol Calc (NIH): 124 mg/dL — ABNORMAL HIGH (ref 0–99)
Triglycerides: 117 mg/dL (ref 0–149)
VLDL Cholesterol Cal: 21 mg/dL (ref 5–40)

## 2020-04-28 LAB — HGB A1C W/O EAG: Hgb A1c MFr Bld: 6.4 % — ABNORMAL HIGH (ref 4.8–5.6)

## 2020-05-08 ENCOUNTER — Other Ambulatory Visit: Payer: Self-pay | Admitting: Obstetrics and Gynecology

## 2020-05-08 DIAGNOSIS — Z1231 Encounter for screening mammogram for malignant neoplasm of breast: Secondary | ICD-10-CM

## 2020-05-18 ENCOUNTER — Ambulatory Visit: Admission: RE | Admit: 2020-05-18 | Payer: Medicaid Other | Source: Ambulatory Visit

## 2020-05-18 ENCOUNTER — Ambulatory Visit
Admission: RE | Admit: 2020-05-18 | Discharge: 2020-05-18 | Disposition: A | Discharge status: Home / Self Care | Payer: Medicaid Other | Source: Ambulatory Visit | Attending: Obstetrics and Gynecology | Admitting: Obstetrics and Gynecology

## 2020-05-18 ENCOUNTER — Other Ambulatory Visit: Payer: Self-pay

## 2020-05-18 ENCOUNTER — Ambulatory Visit: Payer: Self-pay | Admitting: *Deleted

## 2020-05-18 ENCOUNTER — Encounter: Payer: Self-pay | Admitting: *Deleted

## 2020-05-18 VITALS — BP 140/82 | Temp 97.1°F | Wt 218.0 lb

## 2020-05-18 DIAGNOSIS — Z1231 Encounter for screening mammogram for malignant neoplasm of breast: Secondary | ICD-10-CM

## 2020-05-18 DIAGNOSIS — Z1239 Encounter for other screening for malignant neoplasm of breast: Secondary | ICD-10-CM

## 2020-05-18 NOTE — Progress Notes (Signed)
Ms. NYJAH CHOUDHURY is a 54 y.o. female who presents to [facility] clinic today with no complaints.   Pap Smear: Pap smear not completed today. Last Pap smear was 02/15/2016 at [facility] and normal with negative HPV. Per patient has no history of an abnormal Pap smear. Last two Pap smear results are in Epic.   Physical exam: Breasts Breasts symmetrical. No skin abnormalities bilateral breasts. No nipple retraction bilateral breasts. No nipple discharge bilateral breasts. No lymphadenopathy. No lumps palpated bilateral breasts. No complaints of pain or tenderness on exam.      Pelvic/Bimanual Pap is not indicated today per BCCCP guidelines.   Smoking History: Patient is a current smoker. Discussed smoking cessation with patient. Referred to the [facility] Quitline and gave resources to the free smoking cessation classes at [facility].    Patient Navigation: Patient education provided. Access to services provided for patient through [place] program.   Colorectal Cancer Screening: Per patient her never had a colonoscopy. FIT Test completed 06/25/2018 and negative.  No complaints today.    Breast and Cervical Cancer Risk Assessment: Patient has a family history of her mother and two maternal aunts having breast cancer. Patient has no known genetic mutations or history of radiation treatment to the chest before age 85. Patient has no history of cervical dysplasia, immunocompromised, or DES exposure in-utero.  Risk Assessment    Risk Scores      05/18/2020 06/23/2018   Last edited by: Loletta Parish, RN Armond Hang, LPN   5-year risk: 1.3 % 1.2 %   Lifetime risk: 8 % 8.3 %           A: BCCCP exam without pap smear   P: Referred patient to the [facility] for a screening mammogram on the mobile unit. Appointment scheduled Thursday, May 18, 2020 at 1510.  Loletta Parish, RN 05/18/2020 2:46 PM

## 2020-05-18 NOTE — Patient Instructions (Addendum)
Explained breast self awareness with Clyde Blas. Patient did not need a Pap smear today due to last Pap smear and HPV typing was 02/15/2016. Let her know BCCCP will cover Pap smears and HPV typing every 5 years unless has a history of abnormal Pap smears. Referred patient to the [facility] for a screening mammogram on the mobile unit. Appointment scheduled Thursday, May 18, 2020 at 1510. Patient aware of appointment and will be there. Let patient know the [facility] will follow up with her within the next couple weeks with results of her mammogram by letter or phone. Referred to the [facility] Quitline and gave resources to the free smoking cessation classes at [facility]. [place] verbalized understanding.  Keelin Neville, Arvil Chaco, RN 2:46 PM

## 2020-05-30 DIAGNOSIS — N951 Menopausal and female climacteric states: Secondary | ICD-10-CM | POA: Insufficient documentation

## 2020-05-31 ENCOUNTER — Telehealth: Payer: Self-pay | Admitting: Clinical

## 2020-05-31 NOTE — Telephone Encounter (Signed)
LCSW contacted as a referral from provider. Patient shared due to transfer from [facility] to [facility] she now has an appointment with a therapist with [facility] upcoming on the 23rd. Patient thanked LCSW for reaching out. LCSW assisted patient with signing up for vaccine clinic for Monday

## 2020-06-05 ENCOUNTER — Telehealth: Payer: Self-pay

## 2020-06-05 NOTE — Telephone Encounter (Signed)
Patient states. She has been in menopause for at least 2 years. States on June 8th patient had a cycle that lasted 5 days. States the cycle was light but she has not had one in so long she is worried something maybe going on. Per Dr. Amil Amen will order [place]. Patient verbalized understanding.  To Dr. Amil Amen to put in order

## 2020-06-14 ENCOUNTER — Other Ambulatory Visit: Payer: Self-pay

## 2020-06-14 ENCOUNTER — Telehealth (INDEPENDENT_AMBULATORY_CARE_PROVIDER_SITE_OTHER): Payer: No Payment, Other | Admitting: Psychiatry

## 2020-06-14 ENCOUNTER — Ambulatory Visit (INDEPENDENT_AMBULATORY_CARE_PROVIDER_SITE_OTHER): Payer: No Payment, Other | Admitting: Clinical

## 2020-06-14 ENCOUNTER — Encounter (HOSPITAL_COMMUNITY): Payer: Self-pay | Admitting: Psychiatry

## 2020-06-14 DIAGNOSIS — F1410 Cocaine abuse, uncomplicated: Secondary | ICD-10-CM | POA: Diagnosis not present

## 2020-06-14 DIAGNOSIS — F1094 Alcohol use, unspecified with alcohol-induced mood disorder: Secondary | ICD-10-CM | POA: Diagnosis not present

## 2020-06-14 DIAGNOSIS — F3130 Bipolar disorder, current episode depressed, mild or moderate severity, unspecified: Secondary | ICD-10-CM | POA: Diagnosis not present

## 2020-06-14 MED ORDER — QUETIAPINE FUMARATE 200 MG PO TABS: 200.0000 mg | ORAL_TABLET | Freq: Every day | ORAL | 2 refills | Status: DC

## 2020-06-14 MED ORDER — HYDROXYZINE PAMOATE 50 MG PO CAPS: 50.0000 mg | ORAL_CAPSULE | Freq: Three times a day (TID) | ORAL | 2 refills | Status: DC | PRN

## 2020-06-14 MED ORDER — LAMOTRIGINE 100 MG PO TABS: 100.0000 mg | ORAL_TABLET | Freq: Every day | ORAL | 2 refills | Status: DC
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
## 2020-06-15 DIAGNOSIS — F1410 Cocaine abuse, uncomplicated: Secondary | ICD-10-CM | POA: Insufficient documentation
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
## 2020-07-03 ENCOUNTER — Ambulatory Visit: Payer: Medicaid Other | Admitting: Internal Medicine

## 2020-07-04 ENCOUNTER — Ambulatory Visit (INDEPENDENT_AMBULATORY_CARE_PROVIDER_SITE_OTHER): Payer: No Payment, Other | Admitting: Clinical

## 2020-07-04 ENCOUNTER — Other Ambulatory Visit: Payer: Self-pay

## 2020-07-04 DIAGNOSIS — F3130 Bipolar disorder, current episode depressed, mild or moderate severity, unspecified: Secondary | ICD-10-CM | POA: Diagnosis not present

## 2020-07-04 NOTE — Progress Notes (Signed)
   THERAPIST PROGRESS NOTE Virtual Visit via Video Note  I connected with Cheyenne Fowler on 07/04/20 at  1:00 PM EDT by a video enabled telemedicine application and verified that I am speaking with the correct person using two identifiers.  Location: Patient: Home Provider: Office   I discussed the limitations of evaluation and management by telemedicine and the availability of in person appointments. The patient expressed understanding and agreed to proceed.  Session Time: 25 minutes  Participation Level: Active  Behavioral Response: CasualAlertDepressed  Type of Therapy: Individual Therapy  Treatment Goals addressed: Diagnosis: Depression  Interventions: CBT  Summary:  Cheyenne Fowler is a 54 y.o. female who presents for the scheduled session via video oriented times five, appropriately dressed, and friendly. Client denied hallucinations and delusions. Client reported on today she was "irritable and not feeling good". Client reported she did not feel up to discussing much on today because of feeling nauseous and irritable. Client reported her symptoms have prevented her from doing work for today. Client reported she recently had a niece that is having some problems with her menta health and empathizes with her. Client reported feeling emotionally frustrated because although she would like to help she is unable to because she deals with her own. Client discussed also in her current living arrangement her granddaughter talks disrespectfully to her and her the mother make excuses for the child's behavior. Client reported it is hard to tolerate and causes her to feel irritable also. Client reported she has other places she is welcome to stay but does not want to leave because she knows her son and daughter can use her additional help.  Client reported she is also going through Menopause which is affecting her also. Client reported she has been stressed because she made an appointment to  talk to her primary care doctor but it was cancelled. Client reported she is also dealing with getting things prepared to work on her disability case.    Suicidal/Homicidal: Nowith intent/plan   Therapist Response:  Therapist began the session by doing a mood check in and asking open ended questions about how she has been doing since last seen. Therapist continued to work toward a therapeutic relationship by using active listening, eye contact, and offering feedback. Therapist allowed time to focus on the clients thoughts and feelings.  Therapist used CBT to discuss managing irritability with recognizing early warning signs of change in mood and behavior and utilizing a "time out" by communicating with social support. Therapist instructed the client for homework to focus on relaxation techniques and re-prioritzing her worries to prevent feeling overwhelmed during depressive episodes.   Plan: Return again in 2 weeks for individual therapy and continue to comply with medication management.  Diagnosis: Bipolar 1 disorder, current or most recent episode, depressed   Follow Up Instructions:    I discussed the assessment and treatment plan with the patient. The patient was provided an opportunity to ask questions and all were answered. The patient agreed with the plan and demonstrated an understanding of the instructions.   The patient was advised to call back or seek an in-person evaluation if the symptoms worsen or if the condition fails to improve as anticipated.    Birdena Jubilee Cheyenne Decesare, LCSW     [employer], LCSW 07/04/2020

## 2020-07-27 ENCOUNTER — Ambulatory Visit (HOSPITAL_COMMUNITY): Payer: No Payment, Other | Admitting: Clinical

## 2020-07-27 ENCOUNTER — Other Ambulatory Visit: Payer: Self-pay

## 2020-07-27 ENCOUNTER — Telehealth (HOSPITAL_COMMUNITY): Payer: Self-pay | Admitting: Clinical

## 2020-07-27 NOTE — Telephone Encounter (Signed)
Therapist followed up with the clients incoming call to the office nurse about inability to recieve the link for MyChart video visit for therapy session after it was sent at the appointment time. Therapist was able to return the phone call and speak to the client. Therapist asked the client to stay on the line while a new link was sent to ensure she received it. Clients phone disconnected while waiting. Therapist attempted second contact with the client via tele -phone using her mobile number and was unable to reach her. Therapist left a HIPPA compliant voice mail for the client to return the phone call to reschedule her next appointment.

## 2020-08-02 ENCOUNTER — Other Ambulatory Visit: Payer: Self-pay

## 2020-08-02 ENCOUNTER — Ambulatory Visit (INDEPENDENT_AMBULATORY_CARE_PROVIDER_SITE_OTHER): Payer: No Payment, Other | Admitting: Clinical

## 2020-08-02 DIAGNOSIS — F3130 Bipolar disorder, current episode depressed, mild or moderate severity, unspecified: Secondary | ICD-10-CM

## 2020-08-02 NOTE — Progress Notes (Signed)
THERAPIST PROGRESS NOTE Virtual Visit via Telephone Note  I connected with Cheyenne Fowler on 08/02/20 at  3:00 PM EDT by telephone and verified that I am speaking with the correct person using two identifiers.  Location: Patient: Home  Provider: Office   I discussed the limitations, risks, security and privacy concerns of performing an evaluation and management service by telephone and the availability of in person appointments. I also discussed with the patient that there may be a patient responsible charge related to this service. The patient expressed understanding and agreed to proceed.   Follow Up Instructions:    I discussed the assessment and treatment plan with the patient. The patient was provided an opportunity to ask questions and all were answered. The patient agreed with the plan and demonstrated an understanding of the instructions.   The patient was advised to call back or seek an in-person evaluation if the symptoms worsen or if the condition fails to improve as anticipated.   Session Time: 53 minutes  Participation Level: Active  Behavioral Response: CasualAlertAnxious  Type of Therapy: Individual Therapy  Treatment Goals addressed: Diagnosis: Depression  Interventions: CBT  Summary:  Cheyenne Fowler is a 54 Fowler.o. female who presents via tele- phone for the scheduled session. Client presented oriented times five and friendly. Client denied hallucinations and delusions. Client reported on today she was has been working on settling down. Client reported since the last session she and her husband had received an eviction notice from her sons girlfriend whom they've been living with.  Client discussed with the therapist she feels like the transition was the best for her and her husband to leave due to the negative relationship they had. Client reported she tried her best to help with her granddaughter but she felt more negatively triggered than calm and positive  while living there. Client reported due to feeling stress and worried some days she forgot to take her medication which leads to racing thoughts and impulsive behaviors. Client reported currently she and her husband are living with her daughter whom she has a good relationship with. 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 Client engaged with the therapist about discussing how to get back on track when feeling triggered by stress. Client stated, "I'm working on committing to myself to not let myself get back in that mood". Client reported she is working on enforcing her boundaries. Client discussed she enjoys cooking and being able to help other people but she cannot do those things if she is allowing herself to be involved on other family members conflicts.  Client reported she is doing well with her medication management currently. Client talked through with the therapist disrupting her intrusive and cycling thoughts that negatively impact her by directing her energy into another positive thought and/ or activity.      Suicidal/Homicidal: Nowithout intent/plan  Therapist Response:  Therapist began the session by asking how she has been feeling and doing since the last session. Therapist actively listened to the clients thoughts and feelings. Therapist collaborated with the client to discuss her thought and reaction to current stress during transition. Therapist engaged with the client about disrupting triggering thoughts that provoke unhealthy coping habits Therapist instructed the client to work on challenging negative thoughts. Therapist assisted with scheduling the client for next appointments.     Plan: Return again in 3 weeks for individual therapy.  Diagnosis:  Bipolar 1 disorder, most recent episode, depressed    Cheyenne Fowler Cheyenne Pegues, LCSW 08/02/2020

## 2020-08-14 ENCOUNTER — Other Ambulatory Visit: Payer: Self-pay

## 2020-08-14 ENCOUNTER — Encounter (HOSPITAL_COMMUNITY): Payer: Self-pay | Admitting: Psychiatry

## 2020-08-14 ENCOUNTER — Telehealth (INDEPENDENT_AMBULATORY_CARE_PROVIDER_SITE_OTHER): Payer: No Payment, Other | Admitting: Psychiatry

## 2020-08-14 DIAGNOSIS — F1094 Alcohol use, unspecified with alcohol-induced mood disorder: Secondary | ICD-10-CM | POA: Diagnosis not present

## 2020-08-14 DIAGNOSIS — F3130 Bipolar disorder, current episode depressed, mild or moderate severity, unspecified: Secondary | ICD-10-CM | POA: Diagnosis not present

## 2020-08-14 MED ORDER — HYDROXYZINE PAMOATE 50 MG PO CAPS: 50.0000 mg | ORAL_CAPSULE | Freq: Three times a day (TID) | ORAL | 2 refills | Status: DC | PRN

## 2020-08-14 MED ORDER — QUETIAPINE FUMARATE 200 MG PO TABS: 200.0000 mg | ORAL_TABLET | Freq: Every day | ORAL | 2 refills | Status: DC

## 2020-08-14 MED ORDER — LAMOTRIGINE 100 MG PO TABS: 100.0000 mg | ORAL_TABLET | Freq: Every day | ORAL | 2 refills | Status: DC

## 2020-08-14 NOTE — Progress Notes (Signed)
BH MD/PA/NP OP Progress Note Virtual Visit via Video Note  I connected with [place] Blas on 08/14/20 at  1:00 PM EDT by a video enabled telemedicine application and verified that I am speaking with the correct person using two identifiers.  Location: Patient: Home Provider: Clinic   I discussed the limitations of evaluation and management by telemedicine and the availability of in person appointments. The patient expressed understanding and agreed to proceed.  I provided 30 minutes of non-face-to-face time during this encounter.    08/14/2020 5:02 PM CALLIE FACEY  MRN:  [id number]  Chief Complaint:    HPI: 54 year old female seen today for follow up psychiatric evaluation.  She has a psychiatric history of polysubstance use (cocaine and alcohol), depression, and bipolar disorder.  She is currently being managed on Lamictal 100 mg daily, Seroquel 200 mg at bedtime, and Vistaril 50 mg 3 times daily as needed. She reports that her medications are effective in managing her psychiatric conditions.  She denies SI/HI/VH.  Today patient notes that over all she is doing well.  She informed provider that at times she experiences depression however related to her current life situation.  Patient informed provider that she was kicked out of her son's house.  She notes that her granddaughter became disrespectful to her and her daughter-in-law decided that it was best that she leave.  She is now living with her daughter.  She states that this living situation has been better however notes that she would like to live on her own.  However notes that she is unable to financially live alone.  He states that she is currently awaiting her disability hearing.  Provided that her daughter is visiting [facility] and she is housesitting.  She notes that her daughter's dog is sick which has caused her increased stress.    She informed provider that she has cut down her alcohol consumption from 40 ounces of beer a  day to 32 ounces of beer per day.  She also endorses cocaine use.  She notes that she is trying to stop however reports that she has not been successful.  Provider gave patient information on [facility] to help manage sobriety.  Patient however notes that at this time she is not ready to give up her substances.    No medication changes made today.  Patient is agreeable to continue medication as prescribed.  She will follow up with outpatient counselor for therapy.  No other concerns noted at this time.  Visit Diagnosis:    ICD-10-CM   1. Bipolar I disorder, most recent episode depressed (HCC)  F31.30 hydrOXYzine (VISTARIL) 50 MG capsule    lamoTRIgine (LAMICTAL) 100 MG tablet  2. Alcohol-induced mood disorder (HCC)  F10.94 QUEtiapine (SEROQUEL) 200 MG tablet    Past Psychiatric History: Depression, substance induced mood disorder, Bipolar disorder,  Past Medical History:  Past Medical History:  Diagnosis Date  . Anemia   . Bipolar disorder ([place])   . Environmental and seasonal allergies 1968  . Heart murmur   . Prediabetes     Past Surgical History:  Procedure Laterality Date  . [place]  . HERNIA REPAIR     Umbilical hernia in the 2nd grade  . TUBAL LIGATION  1990    Family Psychiatric History: Brother schizophrenia and bipolar, sister bipolar disorder  Family History:  Family History  Problem Relation Age of Onset  . Diabetes Mother   . Cancer Mother  liver--not clear if this was the primary  . COPD Mother   . Cancer Maternal Grandfather        throat  . Schizophrenia Brother   . Bipolar disorder Brother   . Cancer Brother        Not sure of primary  . Breast cancer Maternal Aunt        Cause of death  . Cancer Maternal Aunt        pancreatic  . Bipolar disorder Sister   . Diabetes Sister     Social History:  Social History   Socioeconomic History  . Marital status: Married    Spouse name: [name]  . Number of  children: 3  . Years of education: Not on file  . Highest education level: GED or equivalent  Occupational History  . 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 Smoking status: Current Every Day Smoker    Packs/day: 0.50    Years: 29.00    Pack years: 14.50    Types: Cigarettes  . Smokeless tobacco: Never Used  Vaping Use  . Vaping Use: Never used  Substance and Sexual Activity  . Alcohol use: Yes    Comment: 4 times weekly:  40 oz  . Drug use: Yes    Types: Marijuana, "Crack" cocaine    Comment: Last cocaine use was 30 days ago.  No MJ for 2 months.  . Sexual activity: Yes    Birth control/protection: Surgical  Other Topics Concern  . Not on file  Social History Narrative   She and her husband liver with her son and helping out with her granddaughter.    Born and raised in [place] Strain:   . Difficulty of Paying Living Expenses: Not on file  Food Insecurity:   . Worried About Charity fundraiser in the Last Year: Not on file  . Ran Out of Food in the Last Year: Not on file  Transportation Needs: No Transportation Needs  . Lack of Transportation (Medical): No  . Lack of Transportation (Non-Medical): No  Physical Activity:   . Days of Exercise per Week: Not on file  . Minutes of Exercise per Session: Not on file  Stress:   . Feeling of Stress : Not on file  Social Connections:   . Frequency of Communication with Friends and Family: Not on file  . Frequency of Social Gatherings with Friends and Family: Not on file  . Attends Religious Services: Not on file  . Active Member of Clubs or Organizations: Not on file  . Attends Archivist Meetings: Not on file  . Marital Status: Not on file    Allergies:  Allergies  Allergen Reactions  . Penicillins     REACTION: hives  . Sulfonamide Derivatives     REACTION: hives    Metabolic Disorder Labs: Lab Results  Component Value  Date   HGBA1C 6.4 (H) 04/27/2020   MPG 119.76 07/03/2018   MPG 137 (H) 07/14/2012   No results found for: PROLACTIN Lab Results  Component Value Date   CHOL 187 04/27/2020   TRIG 117 04/27/2020   HDL 42 04/27/2020   CHOLHDL 4.3 07/03/2018   VLDL 31 07/03/2018   LDLCALC 124 (H) 04/27/2020   LDLCALC 101 (H) 07/03/2018   Lab Results  Component Value Date   TSH 0.828 05/23/2012    Therapeutic Level Labs: No results found for:  LITHIUM No results found for: VALPROATE No components found for:  CBMZ  Current Medications: Current Outpatient Medications  Medication Sig Dispense Refill  . cetirizine (ZYRTEC) 10 MG tablet Take 10 mg by mouth daily.    . hydrOXYzine (VISTARIL) 50 MG capsule Take 1 capsule (50 mg total) by mouth 3 (three) times daily as needed. 90 capsule 2  . lamoTRIgine (LAMICTAL) 100 MG tablet Take 1 tablet (100 mg total) by mouth daily. 30 tablet 2  . QUEtiapine (SEROQUEL) 200 MG tablet Take 1 tablet (200 mg total) by mouth at bedtime. 30 tablet 2   No current facility-administered medications for this visit.     Musculoskeletal: Strength & Muscle Tone: Unable to assess due to telehealth visit [place]: Unable to assess due to telehealth visit Patient leans: N/A  Psychiatric Specialty Exam: Review of Systems  Last menstrual period 06/09/2018.There is no height or weight on file to calculate BMI.  General Appearance: Well Groomed  Eye Contact:  Good  Speech:  Clear and Coherent and Normal Rate  Volume:  Normal  Mood:  Euthymic  Affect:  Congruent  Thought Process:  Coherent, Goal Directed and Linear  Orientation:  Full (Time, Place, and Person)  Thought Content: WDL and Logical   Suicidal Thoughts:  No  Homicidal Thoughts:  No  Memory:  Immediate;   Good Recent;   Good Remote;   Good  Judgement:  Fair  Insight:  Fair  Psychomotor Activity:  Unable to assess due to telehealth visit  Concentration:  Concentration: Good and Attention Span: Good   Recall:  Good  Fund of Knowledge: Good  Language: Good  Akathisia:  No  Handed:  Right  AIMS (if indicated): not done  Assets:  Communication Skills Desire for Improvement Housing Leisure Time Social Support  ADL's:  Intact  Cognition: WNL  Sleep:  Good   Screenings: PHQ2-9     Office Visit from 08/27/2018 in [place] Office Visit from 07/22/2018 in [place]  PHQ-2 Total Score 6 6  PHQ-9 Total Score 20 18       Assessment and Plan: Patient endorses symptoms of depression however related to current life stressors.  No medication adjustments made today.  Patient is agreeable to continue medications as prescribed.  1. Bipolar I disorder, most recent episode depressed (HCC)  Continue- hydrOXYzine (VISTARIL) 50 MG capsule; Take 1 capsule (50 mg total) by mouth 3 (three) times daily as needed.  Dispense: 90 capsule; Refill: 2 Continue- lamoTRIgine (LAMICTAL) 100 MG tablet; Take 1 tablet (100 mg total) by mouth daily.  Dispense: 30 tablet; Refill: 2  2. Alcohol-induced mood disorder (HCC)  Continue- QUEtiapine (SEROQUEL) 200 MG tablet; Take 1 tablet (200 mg total) by mouth at bedtime.  Dispense: 30 tablet; Refill: 2  Follow-up in 2 months Follow-up with therapy    Salley Slaughter, NP 08/14/2020, 5:02 PM

## 2020-08-18 ENCOUNTER — Telehealth (INDEPENDENT_AMBULATORY_CARE_PROVIDER_SITE_OTHER): Payer: Self-pay | Admitting: Internal Medicine

## 2020-08-18 DIAGNOSIS — R252 Cramp and spasm: Secondary | ICD-10-CM

## 2020-08-18 DIAGNOSIS — N898 Other specified noninflammatory disorders of vagina: Secondary | ICD-10-CM

## 2020-08-18 DIAGNOSIS — R52 Pain, unspecified: Secondary | ICD-10-CM

## 2020-08-18 DIAGNOSIS — R7303 Prediabetes: Secondary | ICD-10-CM

## 2020-08-18 NOTE — Progress Notes (Signed)
    Subjective:    Patient ID: Cheyenne Fowler, female   DOB: 1966/08/27, 54 y.o.   MRN: [id number]   HPI   Virtual visit via [employer]  1.  Needs a physical.  Having a lot of cramps under ribcage.  Can happen even if not bending forward.  Comes out of the blue and can hardly move.  Also in back and legs.  Has had this for about 2 months.  No change to medication. Fatigued all the time--some of it is due to all her aches and pains--back as well. No polydipsia or polyuria.  2.  Vaginal bump--wants to come in and be seen for this.    3.  Prediabetes:  Has not been able to follow up from labs in May.  She is trying to work on decreasing carbs.  Trying to be physically active.  Walking up and down stairs.  Cleans homes.  Walks dogs.   Housesitting for daughter.  4.  Applying for disability  Current Meds  Medication Sig  . cetirizine (ZYRTEC) 10 MG tablet Take 10 mg by mouth daily.  . hydrOXYzine (VISTARIL) 50 MG capsule Take 1 capsule (50 mg total) by mouth 3 (three) times daily as needed.  . lamoTRIgine (LAMICTAL) 100 MG tablet Take 1 tablet (100 mg total) by mouth daily.  . QUEtiapine (SEROQUEL) 200 MG tablet Take 1 tablet (200 mg total) by mouth at bedtime.   Allergies  Allergen Reactions  . Penicillins     REACTION: hives  . Sulfonamide Derivatives     REACTION: hives     Review of Systems    Objective:   LMP 06/09/2018 (Exact Date)   Physical Exam  NAD   Assessment & Plan  1.  Multiple areas of muscle cramping:  Discussed maintaining good hydration and stretching.  Will obtain blood work when in for visit in October.  2.  Vaginal bump:  Add vaginal exam to appt--no pap--she will get with [facility]  Has appt with me oct 19  3.  Prediabetes:  Discussed diet and physical activity and changes to improve.

## 2020-09-26 ENCOUNTER — Other Ambulatory Visit: Payer: Self-pay

## 2020-09-26 ENCOUNTER — Ambulatory Visit (HOSPITAL_COMMUNITY): Payer: No Payment, Other | Admitting: Clinical

## 2020-09-27 ENCOUNTER — Telehealth (HOSPITAL_COMMUNITY): Payer: Self-pay | Admitting: Clinical

## 2020-09-27 NOTE — Telephone Encounter (Signed)
Therapist attempted to call the client due to no showing for therapy appointment on 09/26/2020. Therapist was unable to speak with the client due to no answer. Therapist was unable to leave a voice mail because it is not set up.

## 2020-09-29 ENCOUNTER — Telehealth (HOSPITAL_COMMUNITY): Payer: Self-pay | Admitting: *Deleted

## 2020-09-29 NOTE — Telephone Encounter (Signed)
Called looking for form for court to decide if she can get disability. Clarified with her she was seeking her medical records for part of the determination and she said that was correct. Medical records not part of my work but agreed I would contact them to see if it could be expedited or where it was in the process.

## 2020-10-06 ENCOUNTER — Encounter: Payer: Medicaid Other | Admitting: Internal Medicine

## 2020-10-06 ENCOUNTER — Other Ambulatory Visit: Payer: Medicaid Other

## 2020-10-10 ENCOUNTER — Encounter: Payer: Medicaid Other | Admitting: Internal Medicine

## 2020-10-12 ENCOUNTER — Encounter (HOSPITAL_COMMUNITY): Payer: Self-pay | Admitting: Psychiatry

## 2020-10-12 ENCOUNTER — Telehealth (INDEPENDENT_AMBULATORY_CARE_PROVIDER_SITE_OTHER): Payer: No Payment, Other | Admitting: Psychiatry

## 2020-10-12 ENCOUNTER — Ambulatory Visit (INDEPENDENT_AMBULATORY_CARE_PROVIDER_SITE_OTHER): Payer: No Payment, Other | Admitting: Clinical

## 2020-10-12 ENCOUNTER — Other Ambulatory Visit: Payer: Self-pay

## 2020-10-12 DIAGNOSIS — F1094 Alcohol use, unspecified with alcohol-induced mood disorder: Secondary | ICD-10-CM | POA: Diagnosis not present

## 2020-10-12 DIAGNOSIS — F3130 Bipolar disorder, current episode depressed, mild or moderate severity, unspecified: Secondary | ICD-10-CM | POA: Diagnosis not present

## 2020-10-12 MED ORDER — QUETIAPINE FUMARATE 200 MG PO TABS: 200.0000 mg | ORAL_TABLET | Freq: Every day | ORAL | 2 refills | Status: DC

## 2020-10-12 MED ORDER — LAMOTRIGINE 100 MG PO TABS: 100.0000 mg | ORAL_TABLET | Freq: Every day | ORAL | 2 refills | Status: DC

## 2020-10-12 MED ORDER — HYDROXYZINE PAMOATE 50 MG PO CAPS: 50.0000 mg | ORAL_CAPSULE | Freq: Three times a day (TID) | ORAL | 2 refills | Status: DC | PRN

## 2020-10-12 NOTE — Progress Notes (Signed)
[facility] MD/PA/NP OP Progress Note Virtual Visit via Telephone Note  I connected with Cheyenne Fowler on 10/12/20 at  3:00 PM EDT by telephone and verified that I am speaking with the correct person using two identifiers.  Location: Patient: home Provider: Clinic   I discussed the limitations, risks, security and privacy concerns of performing an evaluation and management service by telephone and the availability of in person appointments. I also discussed with the patient that there may be a patient responsible charge related to this service. The patient expressed understanding and agreed to proceed.   I provided 30 minutes of non-face-to-face time during this encounter.      10/12/2020 1:43 PM Cheyenne Fowler  MRN:  [id number]  Chief Complaint:  "It's been two months since I touched street drugs"  HPI: 54 year old female seen today for follow up psychiatric evaluation.  She has a psychiatric history of polysubstance use (cocaine and alcohol), depression, and bipolar disorder.  She is currently being managed on Lamictal 100 mg daily, Seroquel 200 mg at bedtime, and Vistaril 50 mg 3 times daily as needed. She reports that her medications are effective in managing her psychiatric conditions.    Today she is well-groomed, pleasant, cooperative, and and engaged in conversation. Patient notes that over all she is doing well. Patient informed Probation officer that her anxiety and depression has improved since she has been taking her medications regularly. She notes that she has not touched cocaine or any other street drugs in over 2 months. She also informed provider that she now lives with her daughter and reports her stress level is down. 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 She notes that she occasionally has symptoms of anxiety and depression but reports that she is able to cope with it well. Provider conducted a GAD-7 and patient  scored a 17. Provider also conducted a PHQ-9 and patient scored an 18. She denies SI/HI/AVH or paranoia.  Patient notes that she missed her therapy appointment because of her disability hearing. She notes that she is going to call the clinic and reschedule a follow-up appointment. No medication changes made today. Patient is agreeable to continue medication as prescribed.  She will follow up with outpatient counselor for therapy.  No other concerns noted at this time.  Visit Diagnosis:    ICD-10-CM   1. Bipolar I disorder, most recent episode depressed (HCC)  F31.30 hydrOXYzine (VISTARIL) 50 MG capsule    lamoTRIgine (LAMICTAL) 100 MG tablet  2. Alcohol-induced mood disorder (HCC)  F10.94 QUEtiapine (SEROQUEL) 200 MG tablet    Past Psychiatric History: Depression, substance induced mood disorder, Bipolar disorder,  Past Medical History:  Past Medical History:  Diagnosis Date  . Anemia   . Bipolar disorder ([place])   . Environmental and seasonal allergies 1968  . Heart murmur   . Prediabetes     Past Surgical History:  Procedure Laterality Date  . [place]  . HERNIA REPAIR     Umbilical hernia in the 2nd grade  . TUBAL LIGATION  1990    Family Psychiatric History: Brother schizophrenia and bipolar, sister bipolar disorder  Family History:  Family History  Problem Relation Age of Onset  . Diabetes Mother   . Cancer Mother        liver--not clear if this was the primary  . COPD Mother   . Cancer Maternal Grandfather  throat  . Schizophrenia Brother   . Bipolar disorder Brother   . Cancer Brother        Not sure of primary  . Breast cancer Maternal Aunt        Cause of death  . Cancer Maternal Aunt        pancreatic  . Bipolar disorder Sister   . Diabetes Sister     Social History:  Social History   Socioeconomic History  . Marital status: Married    Spouse name: [name]  . Number of children: 3  . Years of education: Not on  file  . Highest education level: GED or equivalent  Occupational History  . 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 Smoking status: Current Every Day Smoker    Packs/day: 0.50    Years: 29.00    Pack years: 14.50    Types: Cigarettes  . Smokeless tobacco: Never Used  Vaping Use  . Vaping Use: Never used  Substance and Sexual Activity  . Alcohol use: Yes    Comment: 4 times weekly:  40 oz  . Drug use: Yes    Types: Marijuana, "Crack" cocaine    Comment: Last cocaine use was 30 days ago.  No MJ for 2 months.  . Sexual activity: Yes    Birth control/protection: Surgical  Other Topics Concern  . Not on file  Social History Narrative   She and her husband liver with her son and helping out with her granddaughter.    Born and raised in [place] Strain:   . Difficulty of Paying Living Expenses: Not on file  Food Insecurity:   . Worried About Charity fundraiser in the Last Year: Not on file  . Ran Out of Food in the Last Year: Not on file  Transportation Needs: No Transportation Needs  . Lack of Transportation (Medical): No  . Lack of Transportation (Non-Medical): No  Physical Activity:   . Days of Exercise per Week: Not on file  . Minutes of Exercise per Session: Not on file  Stress:   . Feeling of Stress : Not on file  Social Connections:   . Frequency of Communication with Friends and Family: Not on file  . Frequency of Social Gatherings with Friends and Family: Not on file  . Attends Religious Services: Not on file  . Active Member of Clubs or Organizations: Not on file  . Attends Archivist Meetings: Not on file  . Marital Status: Not on file    Allergies:  Allergies  Allergen Reactions  . Penicillins     REACTION: hives  . Sulfonamide Derivatives     REACTION: hives    Metabolic Disorder Labs: Lab Results  Component Value Date   HGBA1C 6.4 (H) 04/27/2020   MPG  119.76 07/03/2018   MPG 137 (H) 07/14/2012   No results found for: PROLACTIN Lab Results  Component Value Date   CHOL 187 04/27/2020   TRIG 117 04/27/2020   HDL 42 04/27/2020   CHOLHDL 4.3 07/03/2018   VLDL 31 07/03/2018   LDLCALC 124 (H) 04/27/2020   LDLCALC 101 (H) 07/03/2018   Lab Results  Component Value Date   TSH 0.828 05/23/2012    Therapeutic Level Labs: No results found for: LITHIUM No results found for: VALPROATE No components found for:  CBMZ  Current Medications: Current Outpatient Medications  Medication Sig Dispense Refill  .  cetirizine (ZYRTEC) 10 MG tablet Take 10 mg by mouth daily.    . hydrOXYzine (VISTARIL) 50 MG capsule Take 1 capsule (50 mg total) by mouth 3 (three) times daily as needed. 90 capsule 2  . lamoTRIgine (LAMICTAL) 100 MG tablet Take 1 tablet (100 mg total) by mouth daily. 30 tablet 2  . QUEtiapine (SEROQUEL) 200 MG tablet Take 1 tablet (200 mg total) by mouth at bedtime. 30 tablet 2   No current facility-administered medications for this visit.     Musculoskeletal: Strength & Muscle Tone: Unable to assess due to telephone visit. Patient unable to login virtually Gait & Station: Unable to assess due to telephone visit. Patient unable to login virtually Patient leans: N/A  Psychiatric Specialty Exam: Review of Systems  Last menstrual period 06/09/2018.There is no height or weight on file to calculate BMI.  General Appearance: Unable to assess due to telephone visit. Patient unable to login virtually  Eye Contact:  Unable to assess due to telephone visit. Patient unable to login virtually  Speech:  Clear and Coherent and Normal Rate  Volume:  Normal  Mood:  Euthymic  Affect:  Congruent  Thought Process:  Coherent, Goal Directed and Linear  Orientation:  Full (Time, Place, and Person)  Thought Content: WDL and Logical   Suicidal Thoughts:  No  Homicidal Thoughts:  No  Memory:  Immediate;   Good Recent;   Good Remote;   Good   Judgement:  Fair  Insight:  Fair  Psychomotor Activity:  Unable to assess due to telehealth visit  Concentration:  Concentration: Good and Attention Span: Good  Recall:  Good  Fund of Knowledge: Good  Language: Good  Akathisia:  No  Handed:  Right  AIMS (if indicated): not done  Assets:  Communication Skills Desire for Improvement Housing Leisure Time Social Support  ADL's:  Intact  Cognition: WNL  Sleep:  Good   Screenings: GAD-7     [facility]12/2020 [facility]  Total GAD-7 Score 17    PHQ2-9     Video Visit from 10/12/2020 in [facility] Office Visit from 08/27/2018 in [place] Office Visit from 07/22/2018 in [place]  PHQ-2 Total Score 4 6 6   PHQ-9 Total Score 18 20 18        Assessment and Plan: Patient notes that she occasionally feels depressed and anxious however reports that she is able to cope with it well. No medication adjustments made today. Patient agreeable to continue medications as prescribed.  1. Bipolar I disorder, most recent episode depressed (HCC)  Continue- hydrOXYzine (VISTARIL) 50 MG capsule; Take 1 capsule (50 mg total) by mouth 3 (three) times daily as needed.  Dispense: 90 capsule; Refill: 2 Continue- lamoTRIgine (LAMICTAL) 100 MG tablet; Take 1 tablet (100 mg total) by mouth daily.  Dispense: 30 tablet; Refill: 2  2. Alcohol-induced mood disorder (HCC)  Continue- QUEtiapine (SEROQUEL) 200 MG tablet; Take 1 tablet (200 mg total) by mouth at bedtime.  Dispense: 30 tablet; Refill: 2  Follow-up in 3 months Follow-up with therapy    Salley Slaughter, NP 10/12/2020, 1:43 PM

## 2020-10-14 NOTE — Progress Notes (Signed)
   THERAPIST PROGRESS NOTE Virtual Visit via Telephone Note  I connected with Cheyenne Fowler on 10/12/2020 at  2:00 PM EDT by telephone and verified that I am speaking with the correct person using two identifiers.  Location: Patient: Community Provider: Office   I discussed the limitations, risks, security and privacy concerns of performing an evaluation and management service by telephone and the availability of in person appointments. I also discussed with the patient that there may be a patient responsible charge related to this service. The patient expressed understanding and agreed to proceed.   Follow Up Instructions: I discussed the assessment and treatment plan with the patient. The patient was provided an opportunity to ask questions and all were answered. The patient agreed with the plan and demonstrated an understanding of the instructions.   The patient was advised to call back or seek an in-person evaluation if the symptoms worsen or if the condition fails to improve as anticipated.    Session Time: 30 minutes  Participation Level: Active  Behavioral Response: NAAlertAnxious  Type of Therapy: Individual Therapy  Treatment Goals addressed: Diagnosis: Depression  Interventions: CBT  Summary:  Cheyenne Fowler is a 54 y.o. female who presents for the session oriented time five, appropriately dressed, and friendly. Client denied hallucinations and delusions. Client reported on today she is doing okay. 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 Client reported since the last session she has spoken with her lawyer about court proceedings for her disability case. Client reported in that conversation she realized it has been two months since she last used an substances. Client reported she is very proud of that accomplishment. Client reported since the last session moving in with her daughter has gone well and has been  kept busy. Client reported she is helping her daughter with her clothing business and spending time with her grandchildren. Client reported her depressive symptoms pop up every now and again but it is not as bad as it was before. Client reported stressors within her family and extending herself to help them with their problems. Client reported it causes her upset and exhaustion. Client reported she is hoping to be awarded disability this year so she can have mediaid to schedule routine physical check ups and look her own place. Client reported she is glad that she has family that would like for her to live with them but she wants a place of her own to make how she wants.    Suicidal/Homicidal: Nowithout intent/plan  Therapist Response:  Therapist began the session by checking in and asking how she has been doing since she was last seen. Therapist actively listened to the clients thoughts and feelings. Therapist engaged the client in conversation about her progress in symptoms but also ongoing stressors impacting her depressive episodes. Therapist used CBT to discussed utilizing boundaries. Therapist assigned the client homework to utilize her assertiveness and boundaries to prevent feelings of depression and feeling overwhelmed by other stressors. Therapist assisted with scheduling next appointments.    Plan: Return again in 4 weeks for individual therapy.  Diagnosis: Bipolar disorder, most recent episode, depressed  Cheyenne Jubilee Johnavon Mcclafferty, LCSW 10/14/2020

## 2020-12-23 DIAGNOSIS — E119 Type 2 diabetes mellitus without complications: Secondary | ICD-10-CM

## 2020-12-23 HISTORY — DX: Type 2 diabetes mellitus without complications: E11.9

## 2020-12-25 ENCOUNTER — Other Ambulatory Visit: Payer: Self-pay

## 2020-12-25 ENCOUNTER — Ambulatory Visit (INDEPENDENT_AMBULATORY_CARE_PROVIDER_SITE_OTHER): Payer: No Payment, Other | Admitting: Clinical

## 2020-12-25 DIAGNOSIS — F3130 Bipolar disorder, current episode depressed, mild or moderate severity, unspecified: Secondary | ICD-10-CM | POA: Diagnosis not present

## 2020-12-25 NOTE — Progress Notes (Signed)
   THERAPIST PROGRESS NOTE Virtual Visit via Telephone Note  I connected with Cheyenne Fowler on 12/25/20 at  1:00 PM EST by telephone and verified that I am speaking with the correct person using two identifiers.  Location: Patient: home Provider: office   I discussed the limitations, risks, security and privacy concerns of performing an evaluation and management service by telephone and the availability of in person appointments. I also discussed with the patient that there may be a patient responsible charge related to this service. The patient expressed understanding and agreed to proceed.  Follow Up Instructions: I discussed the assessment and treatment plan with the patient. The patient was provided an opportunity to ask questions and all were answered. The patient agreed with the plan and demonstrated an understanding of the instructions.   The patient was advised to call back or seek an in-person evaluation if the symptoms worsen or if the condition fails to improve as anticipated.   Session Time: 16 minutes  Participation Level: Active  Behavioral Response: NAAlertIrritable  Type of Therapy: Individual Therapy  Treatment Goals addressed: Diagnosis: depression  Interventions: CBT  Summary:  Cheyenne Fowler is a 55 y.o. female who presents for the scheduled session oriented times five and cooperative. Client denied hallucinations and delusions. Client reported on today doing well. 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 Client reported she is progressively doing better with separating herself from other stress within her family. Client reported their are things going on with her family that she feels could be better but leaves things to them to figure out so she does not get upset. Client reported otherwise she is happy living with her daughter for the time being. 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 Client reported the environment is less stressful. Client reported she is still waiting to hear back from her lawyer and judge on the determination of her case. Client reported she was unable to follow up on her annual physical with her PCP due to not having the finances for it. Client reported her goal for this year is to work on having stable income for herself so she can work on finding a place to stay.  Suicidal/Homicidal: Nowithout intent/plan  Therapist Response:  Therapist began the session began the session by checking in and asking how she's been doing. Therapist actively listened to the clients thoughts and feelings and used empathy in response. Therapist asked open ended questions to assess the client symptoms and contributing stressors. Therapist used CBT to discuss for her to practice relaxation techniques and boundaries. Client was scheduled for next appointment.    Plan: Return again in 3 weeks for individual therapy.  Diagnosis: Bipolar 1 disorder, most recent episode depressed   Neena Rhymes Collier Bohnet, LCSW 12/25/2020

## 2021-01-08 ENCOUNTER — Ambulatory Visit (HOSPITAL_COMMUNITY): Payer: No Payment, Other | Admitting: Clinical

## 2021-01-08 ENCOUNTER — Telehealth (HOSPITAL_COMMUNITY): Payer: Self-pay | Admitting: Clinical

## 2021-01-08 ENCOUNTER — Other Ambulatory Visit: Payer: Self-pay

## 2021-01-08 NOTE — Telephone Encounter (Signed)
Therapist was able to make phone contact with the client. Client reported she is doing well and not much has changed since she was last seen. Client reported she did not need to hour for therapy on today and would like to be scheduled for future therapy appointments. No concerns at this time.

## 2021-01-12 ENCOUNTER — Telehealth (HOSPITAL_COMMUNITY): Payer: No Payment, Other | Admitting: Psychiatry

## 2021-01-12 ENCOUNTER — Other Ambulatory Visit: Payer: Self-pay

## 2021-02-06 ENCOUNTER — Other Ambulatory Visit: Payer: Medicaid Other | Admitting: Internal Medicine

## 2021-02-07 ENCOUNTER — Ambulatory Visit: Payer: Medicaid Other | Admitting: Internal Medicine

## 2021-02-08 ENCOUNTER — Ambulatory Visit: Payer: Medicaid Other | Admitting: Internal Medicine

## 2021-03-05 ENCOUNTER — Ambulatory Visit (HOSPITAL_COMMUNITY): Payer: No Payment, Other | Admitting: Clinical

## 2021-03-05 ENCOUNTER — Other Ambulatory Visit: Payer: Self-pay

## 2021-03-16 ENCOUNTER — Telehealth: Payer: Self-pay | Admitting: Internal Medicine

## 2021-03-16 NOTE — Telephone Encounter (Signed)
Pt called asking for an appointment. Patient stated that she's having blood in stool since yesterday,and her stool has been  different colors, today light brown with red. Pt stated that she has been taking vitamin C, vitamin B12 and noticed that her stool is softer but not runny. No other symptoms. Please advise.

## 2021-03-17 NOTE — Telephone Encounter (Signed)
Please work her in as an acute at beginning of week or have her go to ED if she develops worsening bleeding.

## 2021-03-29 ENCOUNTER — Ambulatory Visit: Payer: Medicaid Other | Admitting: Internal Medicine

## 2021-03-29 NOTE — Telephone Encounter (Signed)
Patient was scheduled for an appointment on 03/29/2021 but did not showed up to her appointment.

## 2021-04-02 ENCOUNTER — Ambulatory Visit (HOSPITAL_COMMUNITY): Payer: No Payment, Other | Admitting: Clinical

## 2021-04-02 ENCOUNTER — Other Ambulatory Visit: Payer: Self-pay

## 2021-04-06 ENCOUNTER — Telehealth (HOSPITAL_COMMUNITY): Payer: No Payment, Other | Admitting: Psychiatry

## 2021-04-06 ENCOUNTER — Other Ambulatory Visit: Payer: Self-pay

## 2021-05-10 ENCOUNTER — Telehealth: Payer: Self-pay | Admitting: Internal Medicine

## 2021-05-10 NOTE — Telephone Encounter (Signed)
Patient called today (05/10/2021) because she has a upset stomach and been having diarrhea. Patient specified that in her stool there is black bowels, this has been going on since a month ago.   Patient went to the pharmacy to ask about her stool and the pharmacist told the patient that it could be internal bleeding.   She is very worried and would like to set up an appointment.

## 2021-05-10 NOTE — Telephone Encounter (Signed)
She is taking Pepto Bismol and stools were not black before

## 2021-05-10 NOTE — Telephone Encounter (Signed)
Set up for appt tomorrow at 4 p.m.

## 2021-05-11 ENCOUNTER — Other Ambulatory Visit: Payer: Self-pay

## 2021-05-11 ENCOUNTER — Encounter: Payer: Self-pay | Admitting: Internal Medicine

## 2021-05-11 ENCOUNTER — Ambulatory Visit (INDEPENDENT_AMBULATORY_CARE_PROVIDER_SITE_OTHER): Payer: Medicaid Other | Admitting: Internal Medicine

## 2021-05-11 VITALS — BP 158/90 | HR 76 | Resp 12 | Ht 62.75 in | Wt 217.0 lb

## 2021-05-11 DIAGNOSIS — N898 Other specified noninflammatory disorders of vagina: Secondary | ICD-10-CM

## 2021-05-11 DIAGNOSIS — M722 Plantar fascial fibromatosis: Secondary | ICD-10-CM

## 2021-05-11 DIAGNOSIS — J3089 Other allergic rhinitis: Secondary | ICD-10-CM | POA: Diagnosis not present

## 2021-05-11 DIAGNOSIS — R195 Other fecal abnormalities: Secondary | ICD-10-CM

## 2021-05-11 NOTE — Telephone Encounter (Signed)
Patient was scheduled for an appointment at 4:00 pm 05/11/21.

## 2021-05-11 NOTE — Patient Instructions (Signed)
Heel cups Stretch feet twice daily

## 2021-05-11 NOTE — Progress Notes (Signed)
Subjective:    Patient ID: Cheyenne Fowler, female   DOB: 12-Apr-1966, 55 y.o.   MRN: [id number]   HPI   1.  Black stools:  Had an upset stomach with heartburn.  She does take Alka Seltzer Cold Plus, which also has phenylephrine.  She though it would help her digestion.  Started taking Pepto Bismal about 1 week ago.  Started noting black stools about 2 days later.  Last dose of Pepto Bismal was yesterday and has not had a BM today.  Stomach upset is better.   No longer with heartburn.    2.  Allergies:  Using phenylephrine as well as Alka Seltzer plus cold (also with phenylephrine), though the latter for her GI complaints for some reason.   Has used Zyrtec in past.  Willing to try Loratadine.    3.  Swelling or lump to outside of her left labia majora.  Has had for a year.  No change in size.  No pain.  4.  Right heel pain:  Started 02/24/2021.  Was at her grandson's birthday party.  Carpeted floor and wearing Uggs, which are cushioned.  Pain is worst in the morning.  Gradually improves.      Current Meds  Medication Sig   hydrOXYzine (VISTARIL) 50 MG capsule Take 1 capsule (50 mg total) by mouth 3 (three) times daily as needed.   lamoTRIgine (LAMICTAL) 100 MG tablet Take 1 tablet (100 mg total) by mouth daily.   phenylephrine (SUDAFED PE) 10 MG TABS tablet Take 10 mg by mouth every 4 (four) hours as needed.   QUEtiapine (SEROQUEL) 200 MG tablet Take 1 tablet (200 mg total) by mouth at bedtime.   Allergies  Allergen Reactions   Penicillins     REACTION: hives   Sulfonamide Derivatives     REACTION: hives     Review of Systems    Objective:   BP (!) 158/90 (BP Location: Right Arm, Patient Position: Sitting, Cuff Size: Large)   Pulse 76   Resp 12   Ht 5' 2.75" (1.594 m)   Wt 217 lb (98.4 kg)   LMP 06/09/2018 (Exact Date) Comment: Bleeding one month ago.  BMI 38.75 kg/m   Physical Exam HENT:     Head: Normocephalic.     Right Ear: Tympanic membrane, ear canal and  external ear normal.     Left Ear: Tympanic membrane, ear canal and external ear normal.     Nose: Congestion and rhinorrhea (Clear.) present.     Mouth/Throat:     Mouth: Mucous membranes are moist.     Pharynx: Oropharynx is clear.  Eyes:     Extraocular Movements: Extraocular movements intact.     Conjunctiva/sclera: Conjunctivae normal.     Pupils: Pupils are equal, round, and reactive to light.  Cardiovascular:     Rate and Rhythm: Normal rate and regular rhythm.     Pulses: Normal pulses.     Heart sounds: No murmur heard.    No friction rub.  Pulmonary:     Effort: Pulmonary effort is normal.     Breath sounds: Normal breath sounds.  Abdominal:     General: Bowel sounds are normal.     Palpations: Abdomen is soft. There is no hepatomegaly, splenomegaly or mass.     Tenderness: There is no abdominal tenderness.     Hernia: No hernia is present.  Genitourinary:    Rectum: Guaiac result negative. No mass or tenderness.  Feet:  Right foot:     Skin integrity: Skin integrity normal.     Left foot:     Skin integrity: Skin integrity normal.     Comments: Tender over plantar right heel surface.       Assessment & Plan    Dark stool after Using Pepto bismal for hearburn.  Stool guaiac negative.  Discussed appears to be from the [facility], but to call if recurs.  2.  Allergies:  stop decongestant.  Loratadine 10 mg daily.  To call if does not help.  Discussed may need to rotate anithistamines from one year to next if one is not working.  3.  Elevated BP:  no decongestant as above.  BP recheck in 4 weeks.  4.  Right plantar fasciitis:  heelcups.  Stretches/exercises. .  No bare feet.    Follow up with fasting labs in 4 weeks.

## 2021-06-18 ENCOUNTER — Other Ambulatory Visit: Payer: Medicaid Other | Admitting: Internal Medicine

## 2021-06-18 ENCOUNTER — Telehealth: Payer: Self-pay | Admitting: Internal Medicine

## 2021-06-18 NOTE — Telephone Encounter (Signed)
Referral to  [facility] and [facility].

## 2021-07-05 ENCOUNTER — Emergency Department (HOSPITAL_COMMUNITY): Payer: Medicaid Other

## 2021-07-05 ENCOUNTER — Encounter (HOSPITAL_COMMUNITY): Payer: Self-pay

## 2021-07-05 ENCOUNTER — Other Ambulatory Visit: Payer: Self-pay

## 2021-07-05 ENCOUNTER — Emergency Department (HOSPITAL_COMMUNITY)
Admission: EM | Admit: 2021-07-05 | Discharge: 2021-07-05 | Disposition: A | Discharge status: Home / Self Care | Payer: Medicaid Other | Attending: Emergency Medicine | Admitting: Emergency Medicine

## 2021-07-05 DIAGNOSIS — N760 Acute vaginitis: Secondary | ICD-10-CM | POA: Diagnosis not present

## 2021-07-05 DIAGNOSIS — R1030 Lower abdominal pain, unspecified: Secondary | ICD-10-CM | POA: Diagnosis present

## 2021-07-05 DIAGNOSIS — N950 Postmenopausal bleeding: Secondary | ICD-10-CM | POA: Diagnosis not present

## 2021-07-05 DIAGNOSIS — N12 Tubulo-interstitial nephritis, not specified as acute or chronic: Secondary | ICD-10-CM | POA: Diagnosis not present

## 2021-07-05 DIAGNOSIS — F17210 Nicotine dependence, cigarettes, uncomplicated: Secondary | ICD-10-CM | POA: Insufficient documentation

## 2021-07-05 DIAGNOSIS — B9689 Other specified bacterial agents as the cause of diseases classified elsewhere: Secondary | ICD-10-CM | POA: Insufficient documentation

## 2021-07-05 LAB — CBC WITH DIFFERENTIAL/PLATELET
Abs Immature Granulocytes: 0.05 10*3/uL (ref 0.00–0.07)
Basophils Absolute: 0 10*3/uL (ref 0.0–0.1)
Basophils Relative: 0 %
Eosinophils Absolute: 0.2 10*3/uL (ref 0.0–0.5)
Eosinophils Relative: 2 %
HCT: 47.5 % — ABNORMAL HIGH (ref 36.0–46.0)
Hemoglobin: 15.3 g/dL — ABNORMAL HIGH (ref 12.0–15.0)
Immature Granulocytes: 1 %
Lymphocytes Relative: 44 %
Lymphs Abs: 3.6 10*3/uL (ref 0.7–4.0)
MCH: 28.9 pg (ref 26.0–34.0)
MCHC: 32.2 g/dL (ref 30.0–36.0)
MCV: 89.8 fL (ref 80.0–100.0)
Monocytes Absolute: 0.7 10*3/uL (ref 0.1–1.0)
Monocytes Relative: 9 %
Neutro Abs: 3.5 10*3/uL (ref 1.7–7.7)
Neutrophils Relative %: 44 %
Platelets: 298 10*3/uL (ref 150–400)
RBC: 5.29 MIL/uL — ABNORMAL HIGH (ref 3.87–5.11)
RDW: 12.7 % (ref 11.5–15.5)
WBC: 8 10*3/uL (ref 4.0–10.5)
nRBC: 0 % (ref 0.0–0.2)

## 2021-07-05 LAB — COMPREHENSIVE METABOLIC PANEL
ALT: 47 U/L — ABNORMAL HIGH (ref 0–44)
AST: 33 U/L (ref 15–41)
Albumin: 4 g/dL (ref 3.5–5.0)
Alkaline Phosphatase: 82 U/L (ref 38–126)
Anion gap: 9 (ref 5–15)
BUN: 14 mg/dL (ref 6–20)
CO2: 26 mmol/L (ref 22–32)
Calcium: 9.5 mg/dL (ref 8.9–10.3)
Chloride: 104 mmol/L (ref 98–111)
Creatinine, Ser: 1 mg/dL (ref 0.44–1.00)
GFR, Estimated: >60 mL/min (ref >60)
Glucose, Bld: 126 mg/dL — ABNORMAL HIGH (ref 70–99)
Potassium: 3.7 mmol/L (ref 3.5–5.1)
Sodium: 139 mmol/L (ref 135–145)
Total Bilirubin: 0.5 mg/dL (ref 0.3–1.2)
Total Protein: 7.3 g/dL (ref 6.5–8.1)

## 2021-07-05 LAB — URINALYSIS, ROUTINE W REFLEX MICROSCOPIC
Bilirubin Urine: NEGATIVE
Glucose, UA: NEGATIVE mg/dL
Ketones, ur: NEGATIVE mg/dL
Leukocytes,Ua: NEGATIVE
Nitrite: POSITIVE — AB
Protein, ur: NEGATIVE mg/dL
Specific Gravity, Urine: >1.03 — ABNORMAL HIGH (ref 1.005–1.030)
pH: 5.5 (ref 5.0–8.0)

## 2021-07-05 LAB — WET PREP, GENITAL
Sperm: NONE SEEN
Trich, Wet Prep: NONE SEEN
WBC, Wet Prep HPF POC: NONE SEEN
Yeast Wet Prep HPF POC: NONE SEEN

## 2021-07-05 LAB — URINALYSIS, MICROSCOPIC (REFLEX)

## 2021-07-05 LAB — LIPASE, BLOOD: Lipase: 40 U/L (ref 11–51)

## 2021-07-05 MED ORDER — CIPROFLOXACIN IN D5W 400 MG/200ML IV SOLN
400.0000 mg | Freq: Once | INTRAVENOUS | Status: AC
  Administered 2021-07-05: 400 mg via INTRAVENOUS
  Filled 2021-07-05: qty 200

## 2021-07-05 MED ORDER — METRONIDAZOLE 500 MG PO TABS: 500.0000 mg | ORAL_TABLET | Freq: Two times a day (BID) | ORAL | 0 refills | Status: DC

## 2021-07-05 MED ORDER — ONDANSETRON 4 MG PO TBDP: 4.0000 mg | ORAL_TABLET | Freq: Three times a day (TID) | ORAL | 0 refills | Status: DC | PRN

## 2021-07-05 MED ORDER — SODIUM CHLORIDE 0.9 % IV BOLUS
1000.0000 mL | Freq: Once | INTRAVENOUS | Status: AC
  Administered 2021-07-05: 1000 mL via INTRAVENOUS

## 2021-07-05 MED ORDER — ONDANSETRON HCL 4 MG/2ML IJ SOLN
4.0000 mg | Freq: Once | INTRAMUSCULAR | Status: AC
  Administered 2021-07-05: 4 mg via INTRAVENOUS
  Filled 2021-07-05: qty 2

## 2021-07-05 MED ORDER — OXYCODONE HCL 5 MG PO TABS: 5.0000 mg | ORAL_TABLET | Freq: Four times a day (QID) | ORAL | 0 refills | Status: AC | PRN

## 2021-07-05 MED ORDER — CIPROFLOXACIN HCL 500 MG PO TABS: 500.0000 mg | ORAL_TABLET | Freq: Two times a day (BID) | ORAL | 0 refills | Status: DC

## 2021-07-05 MED ORDER — MORPHINE SULFATE (PF) 4 MG/ML IV SOLN
4.0000 mg | Freq: Once | INTRAVENOUS | Status: AC
Start: 2021-07-05 — End: 2021-07-05
  Administered 2021-07-05: 4 mg via INTRAVENOUS
  Filled 2021-07-05: qty 1

## 2021-07-05 NOTE — ED Provider Notes (Signed)
[facility] EMERGENCY DEPARTMENT Provider Note   CSN: [id number] Arrival date & time: 07/05/21  1241     History Chief Complaint  Patient presents with   Abdominal Pain   Back Pain   Vaginal Bleeding    Cheyenne Fowler is a 55 y.o. female with past medical history significant for bipolar, cocaine use, chronic back pain who presents for evaluation of suprapubic pain and flank pain.  Had some bilateral flank pain over the last week.  2 days developed suprapubic pain, dysuria.  Has also noted some hematuria.  Did note when she wiped on the toilet paper that she had some blood on this.  Patient states she is menopausal, did get some vaginal bleeding approximately 3 months ago. LMP 05/2018. She has no midline back pain, no history of IV drug use, bowel or bladder incontinence, saddle paresthesia.  No fever, chills, chest pain, shortness of breath, emesis, unilateral leg swelling, redness, warmth, weakness, paresthesias.  Has not taken anything for symptoms.  Denies additional aggravating relieving factors.  Denies any melena or bright blood per rectum. Rates per pain a 6/10.  History obtained from patient and past medical records.  No interpreter used.  HPI     Past Medical History:  Diagnosis Date   Anemia    Bipolar disorder ([place])    Environmental and seasonal allergies 1968   Heart murmur    Prediabetes     Patient Active Problem List   Diagnosis Date Noted   Cocaine use disorder, mild, abuse ([place]) 06/15/2020   Bipolar I disorder, most recent episode depressed ([place]) 06/14/2020   Menopausal syndrome 05/30/2020   Bipolar disorder ([place])    Prediabetes    Tooth infection 08/27/2018   Hemoptysis 08/27/2018   Need for Tdap vaccination 07/23/2018   Blood in stool 07/23/2018   Depression 07/13/2012   Tobacco abuse 07/13/2012   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    Class: Acute   Polysubstance (excluding opioids) dependence  ([place]) 05/22/2012    Class: Acute   Breast tenderness in female 03/18/2012   Health care maintenance 03/18/2012   DEPRESSION 08/17/2007    Past Surgical History:  Procedure Laterality Date   CESAREAN SECTION  1982, 1983, [employer]     Umbilical hernia in the 2nd grade   TUBAL LIGATION  1990     OB History     Gravida  3   Para  3   Term  3   Preterm      AB  0   Living  3      SAB      IAB      Ectopic      Multiple      Live Births              Family History  Problem Relation Age of Onset   Diabetes Mother    Cancer Mother        liver--not clear if this was the primary   COPD Mother    Cancer Maternal Grandfather        throat   Schizophrenia Brother    Bipolar disorder Brother    Cancer Brother        Not sure of primary   Breast cancer Maternal Aunt        Cause of death   Cancer Maternal Aunt        pancreatic   Bipolar disorder Sister  Diabetes Sister     Social History   Tobacco Use   Smoking status: Every Day    Packs/day: 0.50    Years: 29.00    Pack years: 14.50    Types: Cigarettes   Smokeless tobacco: Never  Vaping Use   Vaping Use: Never used  Substance Use Topics   Alcohol use: Yes    Comment: 4 times weekly:  40 oz   Drug use: Yes    Types: Marijuana, "Crack" cocaine    Comment: Last cocaine use was 30 days ago.  No MJ for 2 months.    Home Medications Prior to Admission medications   Medication Sig Start Date End Date Taking? Authorizing Provider  ciprofloxacin (CIPRO) 500 MG tablet Take 1 tablet (500 mg total) by mouth every 12 (twelve) hours. 07/05/21  Yes Shaundrea Carrigg A, PA-C  metroNIDAZOLE (FLAGYL) 500 MG tablet Take 1 tablet (500 mg total) by mouth 2 (two) times daily. 07/05/21  Yes Gerik Coberly A, PA-C  ondansetron (ZOFRAN ODT) 4 MG disintegrating tablet Take 1 tablet (4 mg total) by mouth every 8 (eight) hours as needed for nausea or vomiting. 07/05/21  Yes Latanja Lehenbauer A, PA-C   oxyCODONE (ROXICODONE) 5 MG immediate release tablet Take 1 tablet (5 mg total) by mouth every 6 (six) hours as needed for up to 3 days for severe pain. 07/05/21 07/08/21 Yes Melaine Mcphee A, PA-C  cetirizine (ZYRTEC) 10 MG tablet Take 10 mg by mouth daily. Patient not taking: Reported on 05/11/2021    [provider]  hydrOXYzine (VISTARIL) 50 MG capsule Take 1 capsule (50 mg total) by mouth 3 (three) times daily as needed. 10/12/20   Salley Slaughter, NP  lamoTRIgine (LAMICTAL) 100 MG tablet Take 1 tablet (100 mg total) by mouth daily. 10/12/20   Salley Slaughter, NP  phenylephrine (SUDAFED PE) 10 MG TABS tablet Take 10 mg by mouth every 4 (four) hours as needed.    [provider]  QUEtiapine (SEROQUEL) 200 MG tablet Take 1 tablet (200 mg total) by mouth at bedtime. 10/12/20   Salley Slaughter, NP    Allergies    Penicillins and Sulfonamide derivatives  Review of Systems   Review of Systems  Constitutional: Negative.   HENT: Negative.    Respiratory: Negative.    Cardiovascular: Negative.   Gastrointestinal:  Positive for abdominal pain and nausea. Negative for abdominal distention, anal bleeding, blood in stool, constipation, diarrhea, rectal pain and vomiting.  Genitourinary:  Positive for dysuria, flank pain, frequency, hematuria, urgency and vaginal bleeding (Unsure if blood from urine or vagina). Negative for decreased urine volume, difficulty urinating, menstrual problem, pelvic pain, vaginal discharge and vaginal pain.  Skin: Negative.   Neurological: Negative.   All other systems reviewed and are negative.  Physical Exam Updated Vital Signs BP (!) 137/92   Pulse 88   Temp 98 F (36.7 C) (Oral)   Resp 18   LMP 06/09/2018 (Exact Date) Comment: Bleeding one month ago.  SpO2 94%   Physical Exam Vitals and nursing note reviewed.  Constitutional:      General: She is not in acute distress.    Appearance: She is well-developed. She is not  ill-appearing, toxic-appearing or diaphoretic.  HENT:     Head: Atraumatic.  Eyes:     Pupils: Pupils are equal, round, and reactive to light.  Cardiovascular:     Rate and Rhythm: Normal rate.     Heart sounds: Normal heart sounds.  Pulmonary:  Effort: No respiratory distress.  Abdominal:     General: Bowel sounds are normal. There is no distension.     Palpations: Abdomen is soft.     Tenderness: There is abdominal tenderness in the suprapubic area. There is right CVA tenderness. There is no guarding or rebound. Negative signs include Murphy's sign and McBurney's sign.     Hernia: No hernia is present.  Genitourinary:    Comments: Normal appearing external female genitalia without rashes or lesions, normal vaginal epithelium. Normal appearing cervix without discharge or petechiae. Cervical os is closed. There is scant dark bleeding noted at the os. No odor. Bimanual: No CMT, non-tender.  No palpable adnexal masses or tenderness. Uterus midline and not fixed. Rectovaginal exam was deferred  No cystocele or rectocele noted. No pelvic lymphadenopathy noted. Wet prep was obtained.  Cultures for gonorrhea and chlamydia collected. Exam performed with chaperone in room.   Musculoskeletal:        General: Normal range of motion.     Cervical back: Normal range of motion.  Skin:    General: Skin is warm and dry.  Neurological:     General: No focal deficit present.     Mental Status: She is alert.  Psychiatric:        Mood and Affect: Mood normal.   ED Results / Procedures / Treatments   Labs (all labs ordered are listed, but only abnormal results are displayed) Labs Reviewed  WET PREP, GENITAL - Abnormal; Notable for the following components:      Result Value   Clue Cells Wet Prep HPF POC PRESENT (*)    All other components within normal limits  CBC WITH DIFFERENTIAL/PLATELET - Abnormal; Notable for the following components:   RBC 5.29 (*)    Hemoglobin 15.3 (*)    HCT 47.5 (*)     All other components within normal limits  COMPREHENSIVE METABOLIC PANEL - Abnormal; Notable for the following components:   Glucose, Bld 126 (*)    ALT 47 (*)    All other components within normal limits  URINALYSIS, ROUTINE W REFLEX MICROSCOPIC - Abnormal; Notable for the following components:   Specific Gravity, Urine >1.030 (*)    Hgb urine dipstick LARGE (*)    Nitrite POSITIVE (*)    All other components within normal limits  URINALYSIS, MICROSCOPIC (REFLEX) - Abnormal; Notable for the following components:   Bacteria, UA MANY (*)    All other components within normal limits  LIPASE, BLOOD  GC/CHLAMYDIA PROBE AMP ([place]) NOT AT [facility]    EKG None  Radiology CT Renal Stone Study  Result Date: 07/05/2021 CLINICAL DATA:  Bilateral flank pain since yesterday with vaginal bleeding EXAM: CT ABDOMEN AND PELVIS WITHOUT CONTRAST TECHNIQUE: Multidetector CT imaging of the abdomen and pelvis was performed following the standard protocol without IV contrast. COMPARISON:  04/01/2017 FINDINGS: Lower chest: Clear lung bases. Normal heart size without pericardial or pleural effusion. Hepatobiliary: Moderate hepatic steatosis. Normal gallbladder, without biliary ductal dilatation. Pancreas: Normal, without mass or ductal dilatation. Spleen: Normal in size, without focal abnormality. Adrenals/Urinary Tract: Normal adrenal glands. No renal calculi or hydronephrosis. No hydroureter or ureteric calculi. No bladder calculi. Stomach/Bowel: Normal stomach, without wall thickening. Normal colon and terminal ileum. Normal appendix. Normal small bowel. Vascular/Lymphatic: Aortic atherosclerosis. No abdominopelvic adenopathy. Reproductive: Normal uterus and adnexa. Other: No significant free fluid. Musculoskeletal: No acute osseous abnormality. IMPRESSION: 1. No urinary tract calculi or hydronephrosis. 2. Hepatic steatosis. 3. Aortic Atherosclerosis (ICD10-I70.0). Electronically  Signed   By: Abigail Miyamoto  M.D.   On: 07/05/2021 19:00    Procedures Procedures   Medications Ordered in ED Medications  sodium chloride 0.9 % bolus 1,000 mL (0 mLs Intravenous Stopped 07/05/21 1905)  ondansetron (ZOFRAN) injection 4 mg (4 mg Intravenous Given 07/05/21 1732)  morphine 4 MG/ML injection 4 mg (4 mg Intravenous Given 07/05/21 1734)  ciprofloxacin (CIPRO) IVPB 400 mg (0 mg Intravenous Stopped 07/05/21 1844)   ED Course  I have reviewed the triage vital signs and the nursing notes.  Pertinent labs & imaging results that were available during my care of the patient were reviewed by me and considered in my medical decision making (see chart for details).  Here for evaluation of bilateral flank pain right greater than left as well as urinary complaints.  she is afebrile, nonseptic, not ill-appearing.  She is also unsure if she has had some vaginal bleeding versus blood in her urine.  She denies any melena or bright blood per rectum.  She has no midline back pain, history of IV drug use, bowel or bladder incontinence, saddle paresthesia  Work-up started from triage which I personally reviewed and interpreted:  CBC without leukocytosis, hemoglobin 15.3, similar to prior Metabolic panel with glucose 126, ALT 47 Urine positive for infection with many bacteria, nitrite positive, denies any vaginal discharge or concerns for any STD Lipase 40 CT Stone without acute findings  Patient pelvic with scant blood in vaginal vault.  Nontender.  No discharge.  Patient is postmenopausal.  She will need follow-up with PCP for likely ultrasound.  Repeat abdomen soft, nontender.  She is tolerating p.o. intake.  She was given IV antibiotics.  Wet prep does show BV.  Given Rx for Flagyl.  Understands not to drink alcohol taking this medication  Symptoms likely related to pyelonephritis.  Will treat as such.  DC home with symptomatic management, close follow-up with PCP.  Patient is nontoxic, nonseptic appearing, in no  apparent distress.  Patient's pain and other symptoms adequately managed in emergency department.  Fluid bolus given.  Labs, imaging and vitals reviewed.  Patient does not meet the SIRS or Sepsis criteria.  On repeat exam patient does not have a surgical abdomin and there are no peritoneal signs.  No indication of appendicitis, bowel obstruction, bowel perforation, cholecystitis, diverticulitis, PID.  Patient discharged home with symptomatic treatment and given strict instructions for follow-up with their primary care physician.  I have also discussed reasons to return immediately to the ER.  Patient expresses understanding and agrees with plan.      MDM Rules/Calculators/A&P                           Final Clinical Impression(s) / ED Diagnoses Final diagnoses:  Pyelonephritis  Post-menopausal bleeding  Bacterial vaginosis    Rx / DC Orders ED Discharge Orders          Ordered    ciprofloxacin (CIPRO) 500 MG tablet  Every 12 hours        07/05/21 1819    oxyCODONE (ROXICODONE) 5 MG immediate release tablet  Every 6 hours PRN        07/05/21 1819    ondansetron (ZOFRAN ODT) 4 MG disintegrating tablet  Every 8 hours PRN        07/05/21 1819    metroNIDAZOLE (FLAGYL) 500 MG tablet  2 times daily        07/05/21 1844  Ricquel Foulk A, PA-C 07/05/21 1916    Dorie Rank, MD 07/08/21 [phone]

## 2021-07-05 NOTE — ED Triage Notes (Signed)
Pt reports bilateral lower back pain for the past 2 weeks that has now radiated to her lower abd and vaginal bleeding since yesterday. States she is going through menopause. Also reports her urine is dark and cloudy. Nausea but no vomiting

## 2021-07-05 NOTE — ED Provider Notes (Signed)
Emergency Medicine Provider Triage Evaluation Note  Cheyenne Fowler , a 55 y.o. female  was evaluated in triage.  Pt complains of acute on chronic low back pain that radiated to her bilateral lower abdomen for the past 2 weeks. Low back pain worse with movement and bending over. She notes it feels different from her normal back pain. Denies saddle paresthesias, bowel/bladder incontinence, lower extremity weakness. No recent low back injury. She states she began having vaginal bleeding yesterday and has already gone through menopause. Endorses chills, but no fever. 3 previous c-sections, but no other abdominal operations. She notes her urine has been dark and cloudy for the past few days. No nausea, vomiting, or diarrhea  Review of Systems  Positive: Back pain, abdominal pain Negative: fever  Physical Exam  BP (!) 156/86 (BP Location: Right Arm)   Pulse 90   Temp 98.2 F (36.8 C) (Oral)   Resp 20   LMP 06/09/2018 (Exact Date) Comment: Bleeding one month ago.  SpO2 96%  Gen:   Awake, no distress   Resp:  Normal effort  MSK:   Moves extremities without difficulty  Other:  Bilateral lower extremities neurovascularly intact. Abdomen soft, non-distended, and non-tender.  Medical Decision Making  Medically screening exam initiated at 1:19 PM.  Appropriate orders placed.  Cheyenne Fowler was informed that the remainder of the evaluation will be completed by another provider, this initial triage assessment does not replace that evaluation, and the importance of remaining in the ED until their evaluation is complete.  Abdominal labs ordered.    Suzy Bouchard, PA-C 07/05/21 1323    Lorelle Gibbs, DO 07/05/21 1342

## 2021-07-05 NOTE — Discharge Instructions (Addendum)
Take the medications as prescribed.  I have prescribed you pain medicine.  Do not drive or operate heavy machinery.  This medication may become addictive.  Take as needed.  I prescribed you with Zofran, nausea medicine.  Your urine did show an infection and given your flank pain I have high suspicion you have pyelonephritis which is infection in your kidney.  You are given IV antibiotics here in the emergency department and I have sent you home with a prescription for antibiotics  Your vaginal swab showed bacterial vaginosis.  This is not an STD.  This is an overgrowth of the normal bacteria that lives in the vagina.  I written you for an antibiotic for this called Flagyl.  Is important not to drink alcohol while taking this medication and for 48 hours after you take this medication.  You did have some vaginal bleeding on your exam.  Given your postmenopausal you will need to follow-up with primary care provider for this as you may need ultrasound or follow-up with OB/GYN.

## 2021-07-05 NOTE — ED Notes (Signed)
Received verbal report from [place]. RN at this time

## 2021-07-06 LAB — GC/CHLAMYDIA PROBE AMP (~~LOC~~) NOT AT ARMC
Chlamydia: NEGATIVE
Comment: NEGATIVE
Comment: NORMAL
Neisseria Gonorrhea: NEGATIVE

## 2021-07-10 ENCOUNTER — Telehealth: Payer: Self-pay

## 2021-07-10 NOTE — Telephone Encounter (Signed)
Pt called to report that after leaving the hospital 07/05/21 and being treated for a kidney infection, she has not felt any better. She is taking the medications given, which has helped with back pain. However, she continues to feel headache, fever, nauseous, and diarrhea. Wants to make an appointment or get recommendations.  Told patient if she continues to feel increasingly worse, visit urgent care

## 2021-07-11 ENCOUNTER — Other Ambulatory Visit: Payer: Medicaid Other

## 2021-07-11 NOTE — Telephone Encounter (Signed)
Patient taking Metronidazole apparently for BV as well as Cipro for the pyelonephritis.  The latter is working well for her and her right flank pain and fever has resolved.  But now with nausea, HA and diarrhea.  Sounds like after Metronidazole.  To hold the latter for now and if better, will treat with topicals when she is done treating UTI.  Call if no improvement stopping Metronidazole.

## 2021-07-11 NOTE — Telephone Encounter (Signed)
Pt visited clinic and spoke with Dr Amil Amen. Antibiotics could be the cause of some symptoms. Scheduled for lab 07/17/21

## 2021-07-17 ENCOUNTER — Other Ambulatory Visit: Payer: Self-pay

## 2021-07-17 ENCOUNTER — Other Ambulatory Visit: Payer: Medicaid Other

## 2021-07-20 NOTE — Telephone Encounter (Signed)
Pt did not answer call. Voicemail left to call back

## 2021-07-20 NOTE — Telephone Encounter (Signed)
Please check to see how she is doing

## 2021-07-23 NOTE — Telephone Encounter (Signed)
Pt did not answer call, voicemail left to call back

## 2021-07-24 NOTE — Telephone Encounter (Signed)
Pt reported that antibiotics works and feels much better with her kidney.   Pt continues to have back/leg pain. Not sure if it is either back or leg or both. Difficult to walk on. She has the gel insole that was recommended, but it did not help. Aleve and tylenol did not help for the pain

## 2021-07-24 NOTE — Telephone Encounter (Signed)
Pt did not answer call, voicemail left to call back

## 2021-07-24 NOTE — Telephone Encounter (Signed)
Letter mailed asking to call [facility]

## 2021-07-24 NOTE — Telephone Encounter (Signed)
Can schedule her for next available for the leg/back issues.

## 2021-07-27 ENCOUNTER — Encounter: Payer: Self-pay | Admitting: Internal Medicine

## 2021-07-27 ENCOUNTER — Other Ambulatory Visit: Payer: Self-pay

## 2021-07-27 ENCOUNTER — Ambulatory Visit: Payer: Medicaid Other | Admitting: Internal Medicine

## 2021-07-27 VITALS — BP 142/96 | HR 76 | Resp 14 | Ht 62.0 in | Wt 215.0 lb

## 2021-07-27 DIAGNOSIS — R590 Localized enlarged lymph nodes: Secondary | ICD-10-CM | POA: Diagnosis not present

## 2021-07-27 DIAGNOSIS — M5416 Radiculopathy, lumbar region: Secondary | ICD-10-CM

## 2021-07-27 DIAGNOSIS — R7303 Prediabetes: Secondary | ICD-10-CM | POA: Diagnosis not present

## 2021-07-27 DIAGNOSIS — M722 Plantar fascial fibromatosis: Secondary | ICD-10-CM | POA: Diagnosis not present

## 2021-07-27 NOTE — Patient Instructions (Signed)
[facility] or [facility] for heel cups.

## 2021-07-27 NOTE — Progress Notes (Signed)
Subjective:    Patient ID: Cheyenne Fowler, female   DOB: [birth year], 55 y.o.   MRN: [id number]   HPI   Right Heel pain:  Doing exercises.  Slippers thicker, but somewhat hard support and Dr. Margart Sickles full foot cushion is quite thin.  No actual heel cups.  2.  Low back pain:  Bilateral.  Throbbing.  She is limping with her heel pain, though this preceded heel pain, but worse since March when heel started hurting.  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 Does have radicular pain down to right posterior knee area. Did have xrays of L/S spine 12/2019 with findings of facet DJD and L1L2 disc height loss--the latter mild.    3.  Noted lump behind left ear yesterday.  A bit tender.  Accidentally hit the bump with her brush yesterday.  Mask ear rings do not touch the area.  Did put relaxer in her hair 2 days prior and irritated her scalp just above her left ear.  Hair is significantly styled today so not able to see scalp well where she has issue.  4.  Prediabetes:  has not followed for prediabetes as recommended last fall (labs)--just coming in for illness or injury.  States she was dealing with loss of home and money at the time.  Labs from July when in ED for pyelonephritis showed elevated blood glucose at 126.  She has not had an A1C done since just over a year ago and measured 6.4% then.  She has tried to cut back on Carbs, particularly processed carbs.  Tries to increase her veggies and fruits.  She does drink a lot of juice (replaced beer with this).     Current Meds  Medication Sig   cetirizine (ZYRTEC) 10 MG tablet Take 10 mg by mouth daily.   lamoTRIgine (LAMICTAL) 100 MG tablet Take 1 tablet (100 mg total) by mouth daily.   QUEtiapine (SEROQUEL) 200 MG tablet Take 1 tablet (200 mg total) by mouth at bedtime.   Allergies  Allergen Reactions   Penicillins     REACTION: hives   Sulfonamide Derivatives     REACTION:  hives     Review of Systems    Objective:   BP (!) 142/96 (BP Location: Left Arm, Patient Position: Sitting, Cuff Size: Normal)   Pulse 76   Resp 14   Ht 5\' 2"  (1.575 m)   Wt 215 lb (97.5 kg)   LMP 06/09/2018 (Exact Date) Comment: Bleeding one month ago.  BMI 39.32 kg/m   Physical Exam NAD HEENT:  PERRL, EOMI, TMs pearly gray.   1 cm thickenend node left retroauricular Throat without injection Neck:  supple, no adenopathy Chest:  CTA CV:  RRR without murmur or rub. Abd:  S, NT No HSM or mass. + BS MS:  tenderness over LS spinous processes and paraspinous musculature.   Neuro:  LE: Motor 5/5, DTRs 2+/4 throughout.  Gait stiff when first stands from sit, but improves as walks.  Tender over right plantar heel.  Assessment & Plan   Right Plantar fasciits:   Rx for heel cups written as unable to find in epic system To use in all shoes.    2.  Lumbar pain with right radiculopathy:  hold on prednisone until A1C back.    3.  Prediabetes:  A1C  4.  LEft postauricular adenopathy:  follow for now.  Suspect related to hair  relaxer treatment.

## 2021-07-27 NOTE — Telephone Encounter (Signed)
Pt scheduled for appointment on 07/27/2021

## 2021-07-28 LAB — HGB A1C W/O EAG: Hgb A1c MFr Bld: 6.4 % — ABNORMAL HIGH (ref 4.8–5.6)

## 2021-08-02 ENCOUNTER — Telehealth: Payer: Self-pay | Admitting: Internal Medicine

## 2021-08-02 NOTE — Telephone Encounter (Signed)
Pt called and said she had multiple wasp stings all over her body that are swollen, red and sore. She wants to know if there is any treatment that can help with the pain and asked if she can get anything prescribed.

## 2021-08-02 NOTE — Telephone Encounter (Signed)
How many stings are we talking about?  Recommend cold compresses to areas of stings.  Benadryl 25 mg every 6 hours as needed.  Call if worsens.

## 2021-08-06 ENCOUNTER — Ambulatory Visit (INDEPENDENT_AMBULATORY_CARE_PROVIDER_SITE_OTHER): Payer: Medicaid Other

## 2021-08-06 ENCOUNTER — Other Ambulatory Visit: Payer: Self-pay

## 2021-08-06 ENCOUNTER — Ambulatory Visit: Payer: Medicaid Other | Admitting: Orthopedic Surgery

## 2021-08-06 DIAGNOSIS — M79671 Pain in right foot: Secondary | ICD-10-CM

## 2021-08-06 DIAGNOSIS — G8929 Other chronic pain: Secondary | ICD-10-CM

## 2021-08-06 DIAGNOSIS — M6701 Short Achilles tendon (acquired), right ankle: Secondary | ICD-10-CM | POA: Diagnosis not present

## 2021-08-06 DIAGNOSIS — M5441 Lumbago with sciatica, right side: Secondary | ICD-10-CM | POA: Diagnosis not present

## 2021-08-06 MED ORDER — PREDNISONE 10 MG PO TABS: 10.0000 mg | ORAL_TABLET | Freq: Every day | ORAL | 0 refills | Status: DC

## 2021-08-07 ENCOUNTER — Encounter: Payer: Self-pay | Admitting: Orthopedic Surgery
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
## 2021-08-17 ENCOUNTER — Telehealth: Payer: Self-pay

## 2021-08-17 NOTE — Telephone Encounter (Signed)
Patient would like to get a pap and mammogram. Already has an office visit 08/24/21, but unsure if pap could get done during that visit.

## 2021-08-20 NOTE — Telephone Encounter (Signed)
Please check reason for visit when patients asking--this is a quick follow up for a lymph node enlargement.  I can order a mammogram, but she needs to make an appt for a CPE for the pap.  In future, would just have Cheyenne Fowler make an appt next available for CPE with pap as loing as it has been 1 year since the last

## 2021-08-21 NOTE — Telephone Encounter (Signed)
Eli scheduled CPE w/ pap

## 2021-08-23 ENCOUNTER — Encounter: Payer: Self-pay | Admitting: Internal Medicine

## 2021-08-23 ENCOUNTER — Other Ambulatory Visit: Payer: Self-pay

## 2021-08-23 ENCOUNTER — Ambulatory Visit: Payer: Medicaid Other | Admitting: Internal Medicine

## 2021-08-23 VITALS — BP 142/86 | HR 80 | Resp 18 | Ht 62.0 in | Wt 217.5 lb

## 2021-08-23 DIAGNOSIS — M722 Plantar fascial fibromatosis: Secondary | ICD-10-CM | POA: Diagnosis not present

## 2021-08-23 DIAGNOSIS — Z1211 Encounter for screening for malignant neoplasm of colon: Secondary | ICD-10-CM

## 2021-08-23 DIAGNOSIS — M5416 Radiculopathy, lumbar region: Secondary | ICD-10-CM | POA: Diagnosis not present

## 2021-08-23 DIAGNOSIS — Z1231 Encounter for screening mammogram for malignant neoplasm of breast: Secondary | ICD-10-CM | POA: Diagnosis not present

## 2021-08-23 DIAGNOSIS — R590 Localized enlarged lymph nodes: Secondary | ICD-10-CM | POA: Diagnosis not present

## 2021-08-23 NOTE — Patient Instructions (Signed)
[facility] [address]  [place], [place] [address] [phone] 40% Discount for [facility] Patients  For [employer]

## 2021-08-23 NOTE — Progress Notes (Signed)
    Subjective:    Patient ID: Cheyenne Fowler, female   DOB: Aug 24, 1966, 55 y.o.   MRN: [id number]   HPI  Plantar fasciitis and low back pain much better with prednisone has helped.  Went to ortho and given more stretches.  2.  Hot flashes:  taking Black Cohosh--had to order online.  Helping  3.  Family history of cancer.  Maternal aunt with breast cancer, died age 40 yo.  Mother died of liver cancer at age 69 yo vs metastatic lung cancer (smoker).  Not clear if ever had hepatitis.  Maternal grandfather with brain cancer, died age 44+, maternal aunt died at age 67 of pancreatic cancer.  Several were long term smokers.  Patient stopped smoking 3 days ago.    4. Retroauricular lymph node enlargement:  resolved about 1 week ago.  No longer tender.   Current Meds  Medication Sig   B Complex Vitamins (VITAMIN B COMPLEX PO) Take by mouth daily.   cetirizine (ZYRTEC) 10 MG tablet Take 10 mg by mouth daily.   lamoTRIgine (LAMICTAL) 100 MG tablet Take 1 tablet (100 mg total) by mouth daily.   predniSONE (DELTASONE) 10 MG tablet Take 1 tablet (10 mg total) by mouth daily with breakfast.   QUEtiapine (SEROQUEL) 200 MG tablet Take 1 tablet (200 mg total) by mouth at bedtime.   Allergies  Allergen Reactions   Penicillins     REACTION: hives   Sulfonamide Derivatives     REACTION: hives     Review of Systems    Objective:   BP (!) 142/86 (BP Location: Right Arm, Patient Position: Sitting, Cuff Size: Normal)   Pulse 80   Resp 18   Ht '5\' 2"'$  (1.575 m)   Wt 217 lb 8 oz (98.7 kg)   LMP 06/09/2018 (Exact Date) Comment: Bleeding one month ago.  BMI 39.78 kg/m   Physical Exam NAD Neck:  Supple, No adenopathy, no retroauricular adenopathy now as well. Chest:  CTA CV:  RRR without murmur or rub.    Assessment & Plan    Left Retroauricular adenopathy:  resolved  2.  HM:  Mammogram.  3.  Hot flashes:  better with Black Cohosh.  4.  Low back pain and plantar fasciitis:  better with  stretches and prednisone through ortho.    5.  Family history of cancer--several were long term smoker.  Sounds like mother had metastatic lung cancer to the liver and not primary liver cancer and long term smoker.  She will look into her mom's history and get back with CPE in October.  Sending for mammogram and screening colonoscopy.

## 2021-08-24 ENCOUNTER — Ambulatory Visit: Payer: Medicaid Other | Admitting: Internal Medicine

## 2021-09-04 ENCOUNTER — Ambulatory Visit (INDEPENDENT_AMBULATORY_CARE_PROVIDER_SITE_OTHER): Payer: Medicaid Other | Admitting: Orthopedic Surgery

## 2021-09-04 DIAGNOSIS — M79671 Pain in right foot: Secondary | ICD-10-CM

## 2021-09-04 DIAGNOSIS — M5441 Lumbago with sciatica, right side: Secondary | ICD-10-CM | POA: Diagnosis not present

## 2021-09-04 DIAGNOSIS — G8929 Other chronic pain: Secondary | ICD-10-CM | POA: Diagnosis not present

## 2021-09-04 MED ORDER — PREDNISONE 10 MG PO TABS: 10.0000 mg | ORAL_TABLET | Freq: Every day | ORAL | 0 refills | Status: DC

## 2021-09-04 NOTE — Progress Notes (Signed)
Office Visit Note   Patient: Cheyenne Fowler           Date of Birth: [birth year]           MRN: [id number] Visit Date: 09/04/2021              Requested by: Mack Hook, MD [place],  [place] [id number] PCP: Mack Hook, MD  Chief Complaint  Patient presents with   Right Foot - Follow-up      HPI: Patient is a 55 year old woman who presents in follow-up for radicular lower back pain and right foot pain.  Patient states the prednisone was very helpful she states she has had significant relief with her back symptoms.  Patient states that this time she now has pain in the MTP joint of the right great toe she is also had some burning in the right hand for 4 weeks she denies any neck pain.  Assessment & Plan: Visit Diagnoses:  1. Pain in right foot   2. Chronic right-sided low back pain with right-sided sciatica     Plan: Patient is called in a new prescription for prednisone 10 mg with breakfast radiographs do show hallux rigidus of the right great toe and recommended stiff soled sneakers.  Follow-Up Instructions: Return in about 4 weeks (around 10/02/2021).   Ortho Exam  Patient is alert, oriented, no adenopathy, well-dressed, normal affect, normal respiratory effort. Examination patient has a negative straight leg raise bilaterally with motor strength 5/5 with no motor weakness.  Patient has circumferential burning in the entire left hand.  She has pain at the MTP joint of the right great toe with decreased range of motion and bony spurs.  Imaging: No results found. No images are attached to the encounter.  Labs: Lab Results  Component Value Date   HGBA1C 6.4 (H) 07/27/2021   HGBA1C 6.4 (H) 04/27/2020   HGBA1C 5.8 (H) 07/03/2018   LABURIC 5.4 09/04/2021   REPTSTATUS 02/04/2019 FINAL 02/02/2019   GRAMSTAIN  07/13/2012    NO WBC SEEN NO SQUAMOUS EPITHELIAL CELLS SEEN RARE GRAM POSITIVE COCCI IN PAIRS RARE GRAM NEGATIVE RODS   CULT >=100,000  COLONIES/mL ESCHERICHIA COLI (A) 02/02/2019   LABORGA ESCHERICHIA COLI (A) 02/02/2019     Lab Results  Component Value Date   ALBUMIN 4.0 07/05/2021   ALBUMIN 4.2 04/27/2020   ALBUMIN 3.7 04/01/2017    Lab Results  Component Value Date   MG 1.9 07/13/2012   No results found for: VD25OH  No results found for: PREALBUMIN CBC EXTENDED Latest Ref Rng & Units 07/05/2021 04/27/2020 04/01/2017  WBC 4.0 - 10.5 K/uL 8.0 9.4 11.9(H)  RBC 3.87 - 5.11 MIL/uL 5.29(H) 4.93 5.03  HGB 12.0 - 15.0 g/dL 15.3(H) 14.3 12.7  HCT 36.0 - 46.0 % 47.5(H) 42.9 40.3  PLT 150 - 400 K/uL 298 280 333  NEUTROABS 1.7 - 7.7 K/uL 3.5 4.5 6.9  LYMPHSABS 0.7 - 4.0 K/uL 3.6 3.9(H) 3.6     There is no height or weight on file to calculate BMI.  Orders:  Orders Placed This Encounter  Procedures   Uric acid   Meds ordered this encounter  Medications   predniSONE (DELTASONE) 10 MG tablet    Sig: Take 1 tablet (10 mg total) by mouth daily with breakfast.    Dispense:  30 tablet    Refill:  0     Procedures: No procedures performed  Clinical Data: No additional findings.  ROS:  All other  systems negative, except as noted in the HPI. Review of Systems  Objective: Vital Signs: LMP 06/09/2018 (Exact Date) Comment: Bleeding one month ago.  Specialty Comments:  No specialty comments available.  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/22/2012    Class: Acute   Polysubstance (excluding opioids) dependence ([place]) 05/22/2012    Class: Acute    Breast tenderness in female 03/18/2012   Health care maintenance 03/18/2012   DEPRESSION 08/17/2007   Past Medical History:  Diagnosis Date   Anemia    Bipolar disorder (HCC)    Environmental and seasonal allergies 1968   Heart murmur    Prediabetes     Family History  Problem Relation Age of Onset   Diabetes Mother    Cancer Mother        liver--not clear if this was the primary   COPD Mother    Cancer Maternal Grandfather        throat   Schizophrenia Brother    Bipolar disorder Brother    Cancer Brother        Not sure of primary   Breast cancer Maternal Aunt        Cause of death   Cancer Maternal Aunt        pancreatic   Bipolar disorder Sister    Diabetes Sister     Past Surgical History:  Procedure Laterality Date   CESAREAN SECTION  1982, 1983, 1990   HERNIA REPAIR     Umbilical hernia in the 2nd grade   TUBAL LIGATION  1990   Social History   Occupational History   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  No MJ for 2 months.   Sexual activity: Yes    Birth control/protection: Surgical

## 2021-09-05 LAB — URIC ACID: Uric Acid, Serum: 5.4 mg/dL (ref 2.5–7.0)

## 2021-09-06 ENCOUNTER — Telehealth: Payer: Self-pay

## 2021-09-06 NOTE — Telephone Encounter (Signed)
I called pt to advise of lab result and to call with any questions.

## 2021-09-06 NOTE — Telephone Encounter (Signed)
-----   Message from Newt Minion, MD sent at 09/06/2021 11:39 AM EDT ----- Call patient.  Uric acid in normal range no signs of gout. ----- Message ----- From: Interface, [facility] Lab Results In Sent: 09/05/2021   1:35 AM EDT To: Newt Minion, MD

## 2021-09-11 ENCOUNTER — Encounter: Payer: Self-pay | Admitting: Orthopedic Surgery

## 2021-09-20 ENCOUNTER — Other Ambulatory Visit: Payer: Self-pay | Admitting: Internal Medicine

## 2021-09-20 DIAGNOSIS — Z1231 Encounter for screening mammogram for malignant neoplasm of breast: Secondary | ICD-10-CM

## 2021-09-25 ENCOUNTER — Ambulatory Visit
Admission: RE | Admit: 2021-09-25 | Discharge: 2021-09-25 | Disposition: A | Discharge status: Home / Self Care | Payer: Medicaid Other | Source: Ambulatory Visit | Attending: Internal Medicine | Admitting: Internal Medicine

## 2021-09-25 ENCOUNTER — Other Ambulatory Visit: Payer: Self-pay

## 2021-09-25 DIAGNOSIS — Z1231 Encounter for screening mammogram for malignant neoplasm of breast: Secondary | ICD-10-CM

## 2021-10-01 ENCOUNTER — Encounter: Payer: Self-pay | Admitting: Orthopedic Surgery

## 2021-10-01 ENCOUNTER — Other Ambulatory Visit: Payer: Self-pay

## 2021-10-01 ENCOUNTER — Ambulatory Visit (INDEPENDENT_AMBULATORY_CARE_PROVIDER_SITE_OTHER): Payer: Medicaid Other | Admitting: Orthopedic Surgery

## 2021-10-01 DIAGNOSIS — M5441 Lumbago with sciatica, right side: Secondary | ICD-10-CM

## 2021-10-01 DIAGNOSIS — G8929 Other chronic pain: Secondary | ICD-10-CM | POA: Diagnosis not present

## 2021-10-01 NOTE — Progress Notes (Signed)
Office Visit Note   Patient: Cheyenne Fowler           Date of Birth: 1966/09/06           MRN: [id number] Visit Date: 10/01/2021              Requested by: Mack Hook, MD [place],  [place] [id number] PCP: Mack Hook, MD  Chief Complaint  Patient presents with   Lower Back - Pain   Right Foot - Pain      HPI: Patient is a 55 year old woman who presents in follow-up for chronic lower back pain she has had pain for years.  She states she has had a little bit of relief with the prednisone but pain is worse at the end of the day.  Pain primarily down the right side to her right foot.  Assessment & Plan: Visit Diagnoses:  1. Chronic right-sided low back pain with right-sided sciatica     Plan: We will continue with the prednisone 10 mg with breakfast a orders written for an MRI scan and we will follow-up after the MRI scan is obtained of her lumbar spine anticipate she would benefit from a epidural steroid injection.  Follow-Up Instructions: Return in about 4 weeks (around 10/29/2021).   Ortho Exam  Patient is alert, oriented, no adenopathy, well-dressed, normal affect, normal respiratory effort. Examination patient sits leaning to the left side with right-sided radicular pain.  She has symmetric motor strength in both lower extremities.  Negative straight leg raise bilaterally.  Imaging: No results found. No images are attached to the encounter.  Labs: Lab Results  Component Value Date   HGBA1C 6.4 (H) 07/27/2021   HGBA1C 6.4 (H) 04/27/2020   HGBA1C 5.8 (H) 07/03/2018   LABURIC 5.4 09/04/2021   REPTSTATUS 02/04/2019 FINAL 02/02/2019   GRAMSTAIN  07/13/2012    NO WBC SEEN NO SQUAMOUS EPITHELIAL CELLS SEEN RARE GRAM POSITIVE COCCI IN PAIRS RARE GRAM NEGATIVE RODS   CULT >=100,000 COLONIES/mL ESCHERICHIA COLI (A) 02/02/2019   LABORGA ESCHERICHIA COLI (A) 02/02/2019     Lab Results  Component Value Date   ALBUMIN 4.0 07/05/2021    ALBUMIN 4.2 04/27/2020   ALBUMIN 3.7 04/01/2017    Lab Results  Component Value Date   MG 1.9 07/13/2012   No results found for: VD25OH  No results found for: PREALBUMIN CBC EXTENDED Latest Ref Rng & Units 07/05/2021 04/27/2020 04/01/2017  WBC 4.0 - 10.5 K/uL 8.0 9.4 11.9(H)  RBC 3.87 - 5.11 MIL/uL 5.29(H) 4.93 5.03  HGB 12.0 - 15.0 g/dL 15.3(H) 14.3 12.7  HCT 36.0 - 46.0 % 47.5(H) 42.9 40.3  PLT 150 - 400 K/uL 298 280 333  NEUTROABS 1.7 - 7.7 K/uL 3.5 4.5 6.9  LYMPHSABS 0.7 - 4.0 K/uL 3.6 3.9(H) 3.6     There is no height or weight on file to calculate BMI.  Orders:  No orders of the defined types were placed in this encounter.  No orders of the defined types were placed in this encounter.    Procedures: No procedures performed  Clinical Data: No additional findings.  ROS:  All other systems negative, except as noted in the HPI. Review of Systems  Objective: Vital Signs: LMP 06/09/2018 (Exact Date) Comment: Bleeding one month ago.  Specialty Comments:  No specialty comments available.  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/22/2012    Class: Acute   Polysubstance (excluding opioids) dependence ([place]) 05/22/2012    Class: Acute   Breast tenderness in female 03/18/2012   Health care maintenance 03/18/2012   DEPRESSION 08/17/2007   Past Medical History:  Diagnosis Date   Anemia    Bipolar disorder (HCC)    Environmental and seasonal allergies 1968   Heart murmur    Prediabetes     Family History   Problem Relation Age of Onset   Diabetes Mother    Cancer Mother        liver--not clear if this was the primary   COPD Mother    Cancer Maternal Grandfather        throat   Schizophrenia Brother    Bipolar disorder Brother    Cancer Brother        Not sure of primary   Breast cancer Maternal Aunt        Cause of death   Cancer Maternal Aunt        pancreatic   Bipolar disorder Sister    Diabetes Sister     Past Surgical History:  Procedure Laterality Date   CESAREAN SECTION  1982, 1983, 1990   HERNIA REPAIR     Umbilical hernia in the 2nd grade   TUBAL LIGATION  1990   Social History   Occupational History   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  No MJ for 2 months.   Sexual activity: Yes    Birth control/protection: Surgical

## 2021-10-02 ENCOUNTER — Telehealth: Payer: Self-pay

## 2021-10-02 MED ORDER — PREDNISONE 10 MG PO TABS: 10.0000 mg | ORAL_TABLET | Freq: Every day | ORAL | 0 refills | Status: DC

## 2021-10-02 NOTE — Telephone Encounter (Signed)
Patient would like a Rx refill on Prednisone sent to her pharmacy.  Cb# [phone].  Please advise.  Thank you.

## 2021-10-02 NOTE — Addendum Note (Signed)
Addended by: Dondra Prader R on: 10/02/2021 09:06 PM   Modules accepted: Orders

## 2021-10-18 ENCOUNTER — Encounter: Payer: Medicaid Other | Admitting: Internal Medicine

## 2021-10-24 ENCOUNTER — Other Ambulatory Visit: Payer: Self-pay

## 2021-10-24 ENCOUNTER — Ambulatory Visit
Admission: RE | Admit: 2021-10-24 | Discharge: 2021-10-24 | Disposition: A | Discharge status: Home / Self Care | Payer: Medicaid Other | Source: Ambulatory Visit | Attending: Orthopedic Surgery | Admitting: Orthopedic Surgery

## 2021-10-24 DIAGNOSIS — G8929 Other chronic pain: Secondary | ICD-10-CM

## 2021-10-29 ENCOUNTER — Ambulatory Visit (INDEPENDENT_AMBULATORY_CARE_PROVIDER_SITE_OTHER): Payer: Medicaid Other | Admitting: Orthopedic Surgery

## 2021-10-29 ENCOUNTER — Encounter: Payer: Self-pay | Admitting: Orthopedic Surgery

## 2021-10-29 DIAGNOSIS — G8929 Other chronic pain: Secondary | ICD-10-CM | POA: Diagnosis not present

## 2021-10-29 DIAGNOSIS — M545 Low back pain, unspecified: Secondary | ICD-10-CM

## 2021-10-29 MED ORDER — PREDNISONE 10 MG PO TABS: 20.0000 mg | ORAL_TABLET | Freq: Every day | ORAL | 0 refills | Status: DC

## 2021-10-29 NOTE — Progress Notes (Signed)
Office Visit Note   Patient: Cheyenne Fowler           Date of Birth: [birth year]           MRN: [id number] Visit Date: 10/29/2021              Requested by: Mack Hook, MD [place],  [place] [id number] PCP: Mack Hook, MD  Chief Complaint  Patient presents with   Lower Back - Follow-up    MRI review      HPI: Patient is a 55 year old woman who presents with lower back pain which is been worse over the past 6 months.  Patient states that it is extremely worse and worse and her lumbar spine.  Denies any radicular symptoms.  She states that the 10 mg of prednisone helped till about 9 PM.    Assessment & Plan: Visit Diagnoses:  1. Chronic midline low back pain without sciatica     Plan: We will have patient take 20 mg of prednisone with breakfast and wean off as the symptoms improved.  We will set her up with physical therapy for core strengthening for the lumbar spine.  Follow-Up Instructions: Return in about 4 weeks (around 11/26/2021).   Ortho Exam  Patient is alert, oriented, no adenopathy, well-dressed, normal affect, normal respiratory effort. Examination patient has difficulty with getting from a sitting to a standing position.  She has an antalgic gait she has a negative straight leg raise bilaterally and no focal motor weakness.  Review of her MRI scan shows lumbar spine facet arthropathy with no acute osseous injuries no significant herniated disc no focal pathology she has essentially degenerative changes throughout the lumbar spine.  Imaging: No results found. No images are attached to the encounter.  Labs: Lab Results  Component Value Date   HGBA1C 6.4 (H) 07/27/2021   HGBA1C 6.4 (H) 04/27/2020   HGBA1C 5.8 (H) 07/03/2018   LABURIC 5.4 09/04/2021   REPTSTATUS 02/04/2019 FINAL 02/02/2019   GRAMSTAIN  07/13/2012    NO WBC SEEN NO SQUAMOUS EPITHELIAL CELLS SEEN RARE GRAM POSITIVE COCCI IN PAIRS RARE GRAM NEGATIVE RODS   CULT  >=100,000 COLONIES/mL ESCHERICHIA COLI (A) 02/02/2019   LABORGA ESCHERICHIA COLI (A) 02/02/2019     Lab Results  Component Value Date   ALBUMIN 4.0 07/05/2021   ALBUMIN 4.2 04/27/2020   ALBUMIN 3.7 04/01/2017    Lab Results  Component Value Date   MG 1.9 07/13/2012   No results found for: VD25OH  No results found for: PREALBUMIN CBC EXTENDED Latest Ref Rng & Units 07/05/2021 04/27/2020 04/01/2017  WBC 4.0 - 10.5 K/uL 8.0 9.4 11.9(H)  RBC 3.87 - 5.11 MIL/uL 5.29(H) 4.93 5.03  HGB 12.0 - 15.0 g/dL 15.3(H) 14.3 12.7  HCT 36.0 - 46.0 % 47.5(H) 42.9 40.3  PLT 150 - 400 K/uL 298 280 333  NEUTROABS 1.7 - 7.7 K/uL 3.5 4.5 6.9  LYMPHSABS 0.7 - 4.0 K/uL 3.6 3.9(H) 3.6     There is no height or weight on file to calculate BMI.  Orders:  No orders of the defined types were placed in this encounter.  No orders of the defined types were placed in this encounter.    Procedures: No procedures performed  Clinical Data: No additional findings.  ROS:  All other systems negative, except as noted in the HPI. Review of Systems  Objective: Vital Signs: LMP 06/09/2018 (Exact Date) Comment: Bleeding one month ago.  Specialty Comments:  No specialty comments  available.  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[place]) 05/22/2012    Class: Acute   Polysubstance (excluding opioids) dependence ([place]) 05/22/2012    Class: Acute   Breast tenderness in female 03/18/2012   Health care maintenance 03/18/2012   DEPRESSION 08/17/2007   Past  Medical History:  Diagnosis Date   Anemia    Bipolar disorder (HCC)    Environmental and seasonal allergies 1968   Heart murmur    Prediabetes     Family History  Problem Relation Age of Onset   Diabetes Mother    Cancer Mother        liver--not clear if this was the primary   COPD Mother    Cancer Maternal Grandfather        throat   Schizophrenia Brother    Bipolar disorder Brother    Cancer Brother        Not sure of primary   Breast cancer Maternal Aunt        Cause of death   Cancer Maternal Aunt        pancreatic   Bipolar disorder Sister    Diabetes Sister     Past Surgical History:  Procedure Laterality Date   CESAREAN SECTION  1982, 1983, 1990   HERNIA REPAIR     Umbilical hernia in the 2nd grade   TUBAL LIGATION  1990   Social History   Occupational History   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  No MJ for 2 months.   Sexual activity: Yes    Birth control/protection: Surgical

## 2021-10-30 ENCOUNTER — Other Ambulatory Visit: Payer: Self-pay

## 2021-10-30 DIAGNOSIS — M545 Low back pain, unspecified: Secondary | ICD-10-CM

## 2021-10-30 DIAGNOSIS — G8929 Other chronic pain: Secondary | ICD-10-CM

## 2021-11-06 ENCOUNTER — Telehealth: Payer: Self-pay

## 2021-11-06 NOTE — Telephone Encounter (Signed)
Pt would like a muscle relaxer or an appt to discuss muscle spasms. Has had issue for about 7 month on he back, stomach area and side of her torso. Tried increasing water and potassium intake, but it has not helped much

## 2021-11-12 ENCOUNTER — Encounter (HOSPITAL_COMMUNITY): Payer: Self-pay | Admitting: Psychiatry

## 2021-11-12 ENCOUNTER — Other Ambulatory Visit (HOSPITAL_COMMUNITY): Payer: Self-pay | Admitting: Psychiatry

## 2021-11-12 ENCOUNTER — Other Ambulatory Visit: Payer: Self-pay

## 2021-11-12 ENCOUNTER — Ambulatory Visit (INDEPENDENT_AMBULATORY_CARE_PROVIDER_SITE_OTHER): Payer: No Payment, Other | Admitting: Psychiatry

## 2021-11-12 DIAGNOSIS — F3130 Bipolar disorder, current episode depressed, mild or moderate severity, unspecified: Secondary | ICD-10-CM

## 2021-11-12 MED ORDER — QUETIAPINE FUMARATE 200 MG PO TABS: 200.0000 mg | ORAL_TABLET | Freq: Every day | ORAL | 3 refills | Status: DC

## 2021-11-12 MED ORDER — HYDROXYZINE PAMOATE 50 MG PO CAPS: 50.0000 mg | ORAL_CAPSULE | Freq: Three times a day (TID) | ORAL | 3 refills | Status: DC | PRN

## 2021-11-12 MED ORDER — LAMOTRIGINE 100 MG PO TABS: 100.0000 mg | ORAL_TABLET | Freq: Every day | ORAL | 3 refills | Status: DC

## 2021-11-12 NOTE — Progress Notes (Signed)
[facility] MD/PA/NP OP Progress Note       11/12/2021 11:31 AM Cheyenne Fowler  MRN:  [id number]  Chief Complaint:  "I have been struggling" Chief Complaint   Stress    HPI: 55 year-old female seen today for follow-up psychiatric evaluation. She has a psychiatric history of polysubstance use (cocaine, alcohol), depression, and bipolar disorder. She was being managed on Lamictal 100mg  daily, Seroquel 200mg  nightly, and Vistaril 50mg  3 times daily as needed. She reports her medications were previously effective, but that she has been out of her medication for 10 days. She reports being anxious and depressed since she has run out of her medication.    Today she is well-groomed, pleasant, cooperative, and engaged in conversation. She informs provider that since her last visit she has been "struggling." She reports back pain, leg pain, and numbness from sciatica. She says her pain level is currently an 8/10. This is being managed with prednisone and injections into her spine. She reports previously living with her daughter in the past, but her daughter moved. She reports she is now living with her husband in an apartment.  She notes that at times to have marital issues as he is not as active in their home and she would like them to be.  She informed Probation officer that this stresses her out because she is preparing for the Thanksgiving holiday and the new home is not ready for guest.  She also notes that she is recently she was approved for disability however notes that is not enough to cover her living expenses.  She informed Probation officer that she is depending on her husband to make up the difference and notes that if he cannot she will do something part-time (hair or babysitting).  Prior to moving into her apartment she was living with her son, his wife, and their children. She reports being sober and not using any substances. Pt notes that her depression and anxiety has been worse since stopping her medication. Provider  conducted a GAD-7 and patient scored a 21, at her last visit she scored a 17. Provider also conducted a PHQ-9 and she scored 24. She previously scored an 18. Currently she denies VH/HI or paranoia. She endorses SI for three months, but states she "absolutely does not want to act" on the ideation; she agrees that if she ever feels she wants to act on her ideation that she will present to the [facility]. She also endorses "hearing noises," but that she thinks it might just be things like the house creaking.  Patient notes her sleep has been poor since being off of medication.  She endorses having an adequate appetite.   Today she is agreeable to restarting Seroquel 200 mg nightly and Vistaril 50 mg 3 times daily as needed.  Lamictal was adjusted to 50 mg daily for 2 weeks and then increasing to 100 mg after 2 weeks. She will follow-up with outpatient therapy as directed. No other concerns today. Visit Diagnosis:    ICD-10-CM   1. Bipolar I disorder, most recent episode depressed (HCC)  F31.30 hydrOXYzine (VISTARIL) 50 MG capsule    lamoTRIgine (LAMICTAL) 100 MG tablet    QUEtiapine (SEROQUEL) 200 MG tablet      Past Psychiatric History: Depression, substance induced mood disorder, Bipolar disorder,  Past Medical History:  Past Medical History:  Diagnosis Date   Anemia    Bipolar disorder ([place])    Environmental and seasonal allergies 1968   Heart murmur    Prediabetes  Past Surgical History:  Procedure Laterality Date   CESAREAN SECTION  1982, 1983, 30   HERNIA REPAIR     Umbilical hernia in the 2nd grade   TUBAL LIGATION  1990    Family Psychiatric History: Brother schizophrenia and bipolar, sister bipolar disorder  Family History:  Family History  Problem Relation Age of Onset   Diabetes Mother    Cancer Mother        liver--not clear if this was the primary   COPD Mother    Cancer Maternal Grandfather        throat   Schizophrenia Brother    Bipolar disorder Brother     Cancer Brother        Not sure of primary   Breast cancer Maternal Aunt        Cause of death   Cancer Maternal Aunt        pancreatic   Bipolar disorder Sister    Diabetes Sister     Social History:  Social History   Socioeconomic History   Marital status: Married    Spouse name: [name]   Number of children: 3   Years of education: Not on file   Highest education level: GED or equivalent  Occupational History   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  No MJ for 2 months.   Sexual activity: Yes    Birth control/protection: Surgical  Other Topics Concern   Not on file  Social History Narrative   She and her husband liver with her son and helping out with her granddaughter.    Born and raised in [place] Strain: Not on file  Food Insecurity: Not on file  Transportation Needs: Not on file  Physical Activity: Not on file  Stress: Not on file  Social Connections: Not on file    Allergies:  Allergies  Allergen Reactions   Penicillins     REACTION: hives   Sulfonamide Derivatives     REACTION: hives    Metabolic Disorder Labs: Lab Results  Component Value Date   HGBA1C 6.4 (H) 07/27/2021   MPG 119.76 07/03/2018   MPG 137 (H) 07/14/2012   No results found for: PROLACTIN Lab Results  Component Value Date   CHOL 187 04/27/2020   TRIG 117 04/27/2020   HDL 42 04/27/2020   CHOLHDL 4.3 07/03/2018   VLDL 31 07/03/2018   LDLCALC 124 (H) 04/27/2020   LDLCALC 101 (H) 07/03/2018   Lab Results  Component Value Date   TSH  0.828 05/23/2012    Therapeutic Level Labs: No results found for: LITHIUM No results found for: VALPROATE No components found for:  CBMZ  Current Medications: Current Outpatient Medications  Medication Sig Dispense Refill   B Complex Vitamins (VITAMIN B COMPLEX PO) Take by mouth daily.     cetirizine (ZYRTEC) 10 MG tablet Take 10 mg by mouth daily.     predniSONE (DELTASONE) 10 MG  tablet Take 1 tablet (10 mg total) by mouth daily with breakfast. 30 tablet 0   predniSONE (DELTASONE) 10 MG tablet Take 2 tablets (20 mg total) by mouth daily with breakfast. 60 tablet 0   hydrOXYzine (VISTARIL) 50 MG capsule Take 1 capsule (50 mg total) by mouth 3 (three) times daily as needed. 90 capsule 3   lamoTRIgine (LAMICTAL) 100 MG tablet Take 1 tablet (100 mg total) by mouth daily. 30 tablet 3   QUEtiapine (SEROQUEL) 200 MG tablet Take 1 tablet (200 mg total) by mouth at bedtime. 30 tablet 3   No current facility-administered medications for this visit.     Musculoskeletal: Strength & Muscle Tone: within normal limits Gait & Station: normal Patient leans: N/A  Psychiatric Specialty Exam: Review of Systems  Blood pressure (!) 146/92, pulse 92, height 5\' 2"  (1.575 m), weight 212 lb (96.2 kg), last menstrual period 06/09/2018.Body mass index is 38.78 kg/m.  General Appearance: Well Groomed  Eye Contact:  Good  Speech:  Clear and Coherent and Normal Rate  Volume:  Normal  Mood:  Anxious and Depressed  Affect:  Congruent  Thought Process:  Coherent, Goal Directed and Linear  Orientation:  Full (Time, Place, and Person)  Thought Content: WDL and Logical   Suicidal Thoughts:  No  Homicidal Thoughts:  No  Memory:  Immediate;   Good Recent;   Good Remote;   Good  Judgement:  Good  Insight:  Good  Psychomotor Activity:  Normal  Concentration:  Concentration: Good and Attention Span: Good  Recall:  Good  Fund of Knowledge: Good  Language: Good  Akathisia:  No  Handed:  Right  AIMS (if  indicated): not done  Assets:  Communication Skills Desire for Improvement Housing Leisure Time Social Support  ADL's:  Intact  Cognition: WNL  Sleep:  Fair   Screenings: GAD-7    Flowsheet Row Clinical Support from 11/12/2021 in [facility] Video Visit from 10/12/2020 in [facility]  Total GAD-7 Score 21 17      PHQ2-9    Flowsheet Row Clinical Support from 11/12/2021 in [facility] Video Visit from 10/12/2020 in [facility] Office Visit from 08/27/2018 in [facility] Office Visit from 07/22/2018 in [place]  PHQ-2 Total Score 6 4 6 6   PHQ-9 Total Score 24 18 20 18       Flowsheet Row Clinical Support from 11/12/2021 in [facility] Error: Q7 should not be populated when Q6 is No        Assessment and Plan: Patient endorses symptoms of anxiety, depression, and hypomania since being unmedicated. Today she is agreeable to restarting Seroquel 200 mg nightly and Vistaril 50 mg 3 times daily as needed.  Lamictal was adjusted to 50 mg daily for 2 weeks and then increasing to 100 mg after 2 weeks   1. Bipolar I disorder, most recent episode depressed ([place])  Restart- hydrOXYzine (VISTARIL) 50 MG capsule; Take 1 capsule (50 mg total) by mouth 3 (three) times daily as needed.  Dispense: 90 capsule; Refill: 3 Restart/take 50 mg for 2 weeks and then increase to 100 mg 2 weeks later- lamoTRIgine (LAMICTAL) 100 MG tablet; Take 1 tablet (100 mg total) by mouth daily.  Dispense: 30 tablet; Refill: 3 Restart- QUEtiapine (SEROQUEL) 200 MG tablet; Take 1 tablet (200 mg total) by mouth at bedtime.  Dispense: 30  tablet; Refill: 3     Salley Slaughter, NP 11/12/2021, 11:31 AM

## 2021-11-19 NOTE — Telephone Encounter (Signed)
Appointment--first available or acute, wait list

## 2021-11-22 ENCOUNTER — Encounter: Payer: Self-pay | Admitting: Internal Medicine

## 2021-11-22 ENCOUNTER — Ambulatory Visit: Payer: Medicaid Other | Admitting: Internal Medicine

## 2021-11-22 ENCOUNTER — Other Ambulatory Visit: Payer: Self-pay | Admitting: Internal Medicine

## 2021-11-22 ENCOUNTER — Other Ambulatory Visit: Payer: Self-pay

## 2021-11-22 VITALS — BP 154/98 | HR 76 | Resp 12 | Ht 62.0 in | Wt 225.0 lb

## 2021-11-22 DIAGNOSIS — M545 Low back pain, unspecified: Secondary | ICD-10-CM | POA: Diagnosis not present

## 2021-11-22 DIAGNOSIS — G8929 Other chronic pain: Secondary | ICD-10-CM

## 2021-11-22 DIAGNOSIS — R252 Cramp and spasm: Secondary | ICD-10-CM

## 2021-11-22 DIAGNOSIS — R7303 Prediabetes: Secondary | ICD-10-CM

## 2021-11-22 DIAGNOSIS — I10 Essential (primary) hypertension: Secondary | ICD-10-CM

## 2021-11-22 DIAGNOSIS — Z23 Encounter for immunization: Secondary | ICD-10-CM

## 2021-11-22 MED ORDER — LOSARTAN POTASSIUM 50 MG PO TABS: 50.0000 mg | ORAL_TABLET | Freq: Every day | ORAL | 11 refills | Status: DC

## 2021-11-22 MED ORDER — VITAMIN B COMPLEX PO TABS: ORAL_TABLET | ORAL | Status: DC

## 2021-11-22 MED ORDER — METFORMIN HCL ER 500 MG PO TB24: 500.0000 mg | ORAL_TABLET | Freq: Every day | ORAL | 11 refills | Status: DC

## 2021-11-22 MED ORDER — GABAPENTIN 100 MG PO CAPS: 100.0000 mg | ORAL_CAPSULE | Freq: Every day | ORAL | 11 refills | Status: DC

## 2021-11-22 NOTE — Patient Instructions (Signed)
[facility], [place] [address]  [phone] Hours of Operation Mondays to Thursdays: 8 am to 8 pm Fridays: 9 am to 8 pm Saturdays: 9 am to 1 pm Sundays: Closed   Or call [facility]

## 2021-11-22 NOTE — Progress Notes (Signed)
    Subjective:    Patient ID: Cheyenne Fowler, female   DOB: April 01, 1966, 55 y.o.   MRN: [id number]   HPI  Dealing with chronic midline low back pain without sciatica for which she is seeing Dr. Sharol Given.  Had MR beginning of last month showing DDD and facet arthropathy.  Has had 2 rounds of prednisone 10 mg daily for 30 days in October and 20 mg for 30 days in November.  States she has not been taking regularly on Nov 15th.  Is to start PT on Dec 8th.  2.  Muscle spasms, anterior rib areas, right abdomen, and bilateral medial thighs.  Stops her in her tracks.  Generally when cleaning her home.  She does try to keep herself hydrated.    3.  Prediabetes with A1C on DM door in August with 10 lb weight gain since and on chronic prednisone.  She admits to polyuria and polydipsia probably for 2 months.    4.  Elevated BP:  may be due to her chronic pain issues as well as prednisone use, but has remained consistently high in patient with risk factors   Current Meds  Medication Sig   B Complex Vitamins (VITAMIN B COMPLEX PO) Take by mouth daily.   cetirizine (ZYRTEC) 10 MG tablet Take 10 mg by mouth daily.   hydrOXYzine (VISTARIL) 50 MG capsule Take 1 capsule (50 mg total) by mouth 3 (three) times daily as needed.   lamoTRIgine (LAMICTAL) 100 MG tablet Take 1 tablet (100 mg total) by mouth daily.   predniSONE (DELTASONE) 10 MG tablet Take 2 tablets (20 mg total) by mouth daily with breakfast.   QUEtiapine (SEROQUEL) 200 MG tablet Take 1 tablet (200 mg total) by mouth at bedtime.   Allergies  Allergen Reactions   Penicillins     REACTION: hives   Sulfonamide Derivatives     REACTION: hives     Review of Systems    Objective:   BP (!) 154/98 (BP Location: Right Arm, Patient Position: Sitting, Cuff Size: Normal)   Pulse 76   Resp 12   Ht 5\' 2"  (1.575 m)   Wt 225 lb (102.1 kg)   LMP 06/09/2018 (Exact Date) Comment: Bleeding one month ago.  BMI 41.15 kg/m   Physical Exam NAD HEENT:   PERRL, EOMI Neck:  Supple, No adenopathy, no thyromegaly Chest:  CTA CV:  RRR without murmur or rub.  Radial and DP pulses normal and equal.  No carotid bruits. Abd:  S, NT, No HSM or mass, + BS LE:  No edema   Assessment & Plan   Low back pain and sciatica:  as per Dr. Sharol Given.  To start PT soon.  For her chronic pain, will start Gabapentin 100 mg at bedtime.  Discuss titration up if helps.  2  Muscle spasms:  to try Super Vitamin B complex to see if helps.    3.  Hypertension:  Losartan 50 mg daily.  Follow up in 2 months.  4.  Prediabetes/obesity:  Referral to [place] with Leeann Must for cooking classes and nutrition support.  Encouraged her with her musculoskeletal issues to consider joining [facility] and getting into water exercise. Suspect she is falling into DM range now.  CBC, CMP, A1C, FLP  5.  HM:  influenza vaccine.

## 2021-11-23 LAB — COMPREHENSIVE METABOLIC PANEL
ALT: 72 IU/L — ABNORMAL HIGH (ref 0–32)
AST: 38 IU/L (ref 0–40)
Albumin/Globulin Ratio: 2 (ref 1.2–2.2)
Albumin: 3.9 g/dL (ref 3.8–4.9)
Alkaline Phosphatase: 91 IU/L (ref 44–121)
BUN/Creatinine Ratio: 17 (ref 9–23)
BUN: 15 mg/dL (ref 6–24)
Bilirubin Total: <0.2 mg/dL (ref 0.0–1.2)
CO2: 26 mmol/L (ref 20–29)
Calcium: 9 mg/dL (ref 8.7–10.2)
Chloride: 105 mmol/L (ref 96–106)
Creatinine, Ser: 0.86 mg/dL (ref 0.57–1.00)
Globulin, Total: 2 g/dL (ref 1.5–4.5)
Glucose: 85 mg/dL (ref 70–99)
Potassium: 4.3 mmol/L (ref 3.5–5.2)
Sodium: 145 mmol/L — ABNORMAL HIGH (ref 134–144)
Total Protein: 5.9 g/dL — ABNORMAL LOW (ref 6.0–8.5)
eGFR: 79 mL/min/{1.73_m2} (ref >59)

## 2021-11-23 LAB — CBC WITH DIFFERENTIAL/PLATELET
Basophils Absolute: 0 10*3/uL (ref 0.0–0.2)
Basos: 0 %
EOS (ABSOLUTE): 0.1 10*3/uL (ref 0.0–0.4)
Eos: 1 %
Hematocrit: 44.1 % (ref 34.0–46.6)
Hemoglobin: 14.3 g/dL (ref 11.1–15.9)
Immature Grans (Abs): 0.1 10*3/uL (ref 0.0–0.1)
Immature Granulocytes: 1 %
Lymphocytes Absolute: 5.2 10*3/uL — ABNORMAL HIGH (ref 0.7–3.1)
Lymphs: 48 %
MCH: 29.2 pg (ref 26.6–33.0)
MCHC: 32.4 g/dL (ref 31.5–35.7)
MCV: 90 fL (ref 79–97)
Monocytes Absolute: 0.9 10*3/uL (ref 0.1–0.9)
Monocytes: 8 %
Neutrophils Absolute: 4.5 10*3/uL (ref 1.4–7.0)
Neutrophils: 42 %
Platelets: 247 10*3/uL (ref 150–450)
RBC: 4.89 x10E6/uL (ref 3.77–5.28)
RDW: 13.4 % (ref 11.7–15.4)
WBC: 10.8 10*3/uL (ref 3.4–10.8)

## 2021-11-23 LAB — HGB A1C W/O EAG: Hgb A1c MFr Bld: 6.6 % — ABNORMAL HIGH (ref 4.8–5.6)

## 2021-11-23 LAB — LIPID PANEL W/O CHOL/HDL RATIO
Cholesterol, Total: 211 mg/dL — ABNORMAL HIGH (ref 100–199)
HDL: 68 mg/dL (ref >39)
LDL Chol Calc (NIH): 117 mg/dL — ABNORMAL HIGH (ref 0–99)
Triglycerides: 151 mg/dL — ABNORMAL HIGH (ref 0–149)
VLDL Cholesterol Cal: 26 mg/dL (ref 5–40)

## 2021-11-26 ENCOUNTER — Ambulatory Visit (INDEPENDENT_AMBULATORY_CARE_PROVIDER_SITE_OTHER): Payer: Medicaid Other | Admitting: Physical Medicine and Rehabilitation

## 2021-11-26 ENCOUNTER — Other Ambulatory Visit: Payer: Self-pay

## 2021-11-26 ENCOUNTER — Ambulatory Visit: Payer: Medicaid Other | Admitting: Orthopedic Surgery

## 2021-11-26 ENCOUNTER — Encounter: Payer: Self-pay | Admitting: Physical Medicine and Rehabilitation

## 2021-11-26 VITALS — BP 114/73 | HR 92 | Ht 62.0 in | Wt 225.0 lb

## 2021-11-26 DIAGNOSIS — M545 Low back pain, unspecified: Secondary | ICD-10-CM

## 2021-11-26 DIAGNOSIS — M7918 Myalgia, other site: Secondary | ICD-10-CM | POA: Diagnosis not present

## 2021-11-26 DIAGNOSIS — M47816 Spondylosis without myelopathy or radiculopathy, lumbar region: Secondary | ICD-10-CM | POA: Diagnosis not present

## 2021-11-26 DIAGNOSIS — G8929 Other chronic pain: Secondary | ICD-10-CM | POA: Diagnosis not present

## 2021-11-26 MED ORDER — DIAZEPAM 5 MG PO TABS: ORAL_TABLET | ORAL | 0 refills | Status: DC

## 2021-11-26 NOTE — Progress Notes (Signed)
Cheyenne Fowler - 55 y.o. female MRN [id number]  Date of birth: [birth year]  Office Visit Note: Visit Date: 11/26/2021 PCP: Mack Hook, MD Referred by: Mack Hook, MD  Subjective: Chief Complaint  Patient presents with   Lower Back - Pain   HPI: Cheyenne Fowler is a 55 y.o. female who comes in today per the request of Dr. Meridee Score for evaluation of bilateral lower back pain. Patient also reports soreness to her entire back. Patient reports pain has been ongoing for several months. Patient reports pain is exacerbated by movement, activity and bending over. She describes pain as a sharp and sore sensation, currently rates as 9 out of 10. Patient reports some relief of pain with rest and use of medications. She recently started taking Gabapentin at night that is being prescribed by her primary care provider Dr. Mack Hook. Patient's recent lumbar MRI exhibits severe bilateral facet arthropathy at L4-L5 and L5-S1, no high grade spinal canal stenosis. Patient states she has recently noticed difficulty performing daily tasks such as cleaning and grocery shopping. Patient did have a referral placed by Dr. Meridee Score for formal physical therapy at [facility] and has her first session on 11/29/2021. Patient is currently using cane to assist with ambulation and prevent falls. Patient denies focal weakness, numbness and tingling. Patient denies recent trauma or falls.   Review of Systems  Musculoskeletal:  Positive for back pain.  Neurological:  Negative for tingling, sensory change, focal weakness and weakness.  All other systems reviewed and are negative. Otherwise per HPI.  Assessment & Plan: Visit Diagnoses:    ICD-10-CM   1. Chronic bilateral low back pain without sciatica  M54.50 Ambulatory referral to Physical Medicine Rehab   G89.29     2. Spondylosis without myelopathy or radiculopathy, lumbar region  M47.816     3. Myofascial  pain syndrome  M79.18        Plan: Findings:  Chronic, worsening and severe bilateral axial back pain. Patient continues to have excruciating and dilatating pain despite good conservative therapies such as rest and use of medications. Patient's clinical presentation and exam are consistent with facet mediated pain. We also feel there could be a myofascial component to her pain as well. We believe the next step is to perform bilateral L4-L5 and L5-S1 facet joint injections under fluoroscopic guidance. If patient does well with facet joint injections we did speak with her today about the possibility of longer sustained pain relief with radiofrequency ablation. We did give her educational literature regarding radiofrequency ablation today for her to take home and review. Patient also voiced concerns about anxiety surrounding injections. We did speak with her about using oral Valium pre-procedure to help her relax and to reduce anxiety. We did place an order for oral Valium today for her to take on the day of procedure. Patient will be attending formal physical therapy on 11/29/21 at [facility]. No red flag symptoms noted upon exam today.    Meds & Orders:  Meds ordered this encounter  Medications   diazepam (VALIUM) 5 MG tablet    Sig: Take one tablet by mouth with food one hour prior to procedure. May repeat 30 minutes prior if needed.    Dispense:  2 tablet    Refill:  0    Order Specific Question:   Supervising Provider    Answer:   Magnus Sinning [[id number]]     Orders Placed This Encounter  Procedures  Ambulatory referral to Physical Medicine Rehab    Follow-up: Return for Bilateral L4-L5 and L5-S1 facet joint injections.   Procedures: No procedures performed      Clinical History: MRI LUMBAR SPINE WITHOUT CONTRAST   TECHNIQUE: Multiplanar, multisequence MR imaging of the lumbar spine was performed. No intravenous contrast was administered.   COMPARISON:  X-ray lumbar 08/06/2021.    FINDINGS: Segmentation:  Standard.   Alignment: Minimal grade 1 anterolisthesis of L4 on L5. Minimal grade 1 anterolisthesis of L5 on S1.   Vertebrae: No acute fracture, evidence of discitis, or aggressive bone lesion.   Conus medullaris and cauda equina: Conus extends to the L2 level. Conus and cauda equina appear normal.   Paraspinal and other soft tissues: No acute paraspinal abnormality.   Disc levels:   Disc spaces: Degenerative disease with disc height loss at T10-11, T11-12 and L1-2.   T10-11 and T11-12: Mild broad-based disc bulge. No foraminal or central canal stenosis.   T12-L1: No disc protrusion, foraminal stenosis or central canal stenosis.   L1-L2: Broad-based disc bulge with a small central disc protrusion. No foraminal or central canal stenosis.   L2-L3: No significant disc bulge. No neural foraminal stenosis. No central canal stenosis. Mild bilateral facet arthropathy.   L3-L4: No significant disc bulge. No neural foraminal stenosis. No central canal stenosis. Mild bilateral facet arthropathy with bilateral facet effusions.   L4-L5: Mild broad-based disc bulge. Severe bilateral facet arthropathy. No foraminal or central canal stenosis.   L5-S1: No disc protrusion. Severe bilateral facet arthropathy. No foraminal or central canal stenosis.   IMPRESSION: 1. Lumbar spine spondylosis as described above. 2. No acute osseous injury of the lumbar spine.     Electronically Signed   By: Kathreen Devoid M.D.   On: 10/26/2021 08:32   She reports that she has been smoking cigarettes. She has a 14.50 pack-year smoking history. She has never used smokeless tobacco.  Recent Labs    07/27/21 1302 09/04/21 1515 11/22/21 1242  HGBA1C 6.4*  --  6.6*  LABURIC  --  5.4  --     Objective:  VS:  HT:5\' 2"  (157.5 cm)   WT:225 lb (102.1 kg)  BMI:41.14    BP:114/73  HR:92bpm  TEMP: ( )  RESP:  Physical Exam Vitals reviewed.  HENT:     Head: Normocephalic and  atraumatic.     Right Ear: External ear normal.     Left Ear: External ear normal.     Nose: Nose normal.     Mouth/Throat:     Mouth: Mucous membranes are moist.  Eyes:     Extraocular Movements: Extraocular movements intact.  Cardiovascular:     Rate and Rhythm: Normal rate.     Pulses: Normal pulses.  Pulmonary:     Effort: Pulmonary effort is normal.  Abdominal:     General: Abdomen is flat. There is no distension.  Musculoskeletal:        General: Tenderness present.     Cervical back: Normal range of motion.     Comments: Pt is slow to rise from seated position to standing. Concordant low back pain with facet loading, lumbar spine extension and rotation. Strong distal strength without clonus, no pain upon palpation of greater trochanters. Sensation intact bilaterally. Ambulates with walker, gait slow.  Skin:    General: Skin is warm and dry.     Capillary Refill: Capillary refill takes less than 2 seconds.  Neurological:     Mental Status: She  is alert and oriented to person, place, and time.     Gait: Gait abnormal.  Psychiatric:        Mood and Affect: Mood normal.    Ortho Exam  Imaging: No results found.  Past Medical/Family/Surgical/Social History: Medications & Allergies reviewed per EMR, new medications updated. 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    Class: Acute   Polysubstance (excluding opioids) dependence ([place]) 05/22/2012    Class: Acute   Breast tenderness in female 03/18/2012   Health care  maintenance 03/18/2012   DEPRESSION 08/17/2007   Past Medical History:  Diagnosis Date   Anemia    Bipolar disorder (HCC)    Environmental and seasonal allergies 1968   Heart murmur    Prediabetes    Family History  Problem Relation Age of Onset   Diabetes Mother    Cancer Mother        liver--not clear if this was the primary   COPD Mother    Cancer Maternal Grandfather        throat   Schizophrenia Brother    Bipolar disorder Brother    Cancer Brother        Not sure of primary   Breast cancer Maternal Aunt        Cause of death   Cancer Maternal Aunt        pancreatic   Bipolar disorder Sister    Diabetes Sister    Past Surgical History:  Procedure Laterality Date   CESAREAN SECTION  1982, 1983, 1990   HERNIA REPAIR     Umbilical hernia in the 2nd grade   TUBAL LIGATION  1990   Social History   Occupational History   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  No MJ for 2 months.   Sexual activity: Yes    Birth control/protection: Surgical

## 2021-11-26 NOTE — Progress Notes (Signed)
Patient presents with pain across low back that occasionally radiates down right leg. She does have some numbness in right toes. She feels that the right leg is weak. She took prednisone for pain, which helped at first, but she was taken off of that due to taking it too much.  She is taking tylenol 650mg  or aleve for pain which only eases her symptoms some. She also takes gabapentin at night. Pain increases with standing or walking.  The longer she does these activities, the worse she hurts.      Numeric Pain Rating Scale and Functional Assessment Average Pain 6   In the last MONTH (on 0-10 scale) has pain interfered with the following?  1. General activity like being  able to carry out your everyday physical activities such as walking, climbing stairs, carrying groceries, or moving a chair?  Rating(10)   -BT, -Dye Allergies.

## 2021-11-27 NOTE — Telephone Encounter (Signed)
Need by dr Amil Amen

## 2021-11-29 ENCOUNTER — Encounter: Payer: Self-pay | Admitting: Gastroenterology

## 2021-11-29 ENCOUNTER — Ambulatory Visit: Payer: Medicaid Other

## 2021-11-29 NOTE — Progress Notes (Deleted)
  OUTPATIENT PHYSICAL THERAPY TREATMENT NOTE   [name] MRN: [id number] DOB:15-Nov-1966, 55 y.o., female Today's Date: 11/29/2021  PCP: Mack Hook, MD REFERRING PROVIDER: Newt Minion, MD    Past Medical History:  Diagnosis Date   Anemia    Bipolar disorder ([place])    Environmental and seasonal allergies 1968   Heart murmur    Prediabetes    Past Surgical History:  Procedure Laterality Date   [place]     Umbilical hernia in the 2nd grade   TUBAL LIGATION  1990   Patient Active Problem List   Diagnosis Date Noted   Cocaine use disorder, mild, abuse ([place]) 06/15/2020   Bipolar I disorder, most recent episode depressed ([place]) 06/14/2020   Menopausal syndrome 05/30/2020   Bipolar disorder ([place])    Prediabetes    Tooth infection 08/27/2018   Hemoptysis 08/27/2018   Need for Tdap vaccination 07/23/2018   Blood in stool 07/23/2018   Depression 07/13/2012   Tobacco abuse 07/13/2012   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  PERTINENT HISTORY: ***  PRECAUTIONS: ***  SUBJECTIVE: ***  PAIN:  Are you having pain? {yes/no:20286} VAS scale: ***/10 Pain location: *** Pain orientation: {Pain Orientation:25161}  PAIN TYPE: {type:313116} Pain description: {PAIN DESCRIPTION:21022940}  Aggravating factors: *** Relieving factors: ***    OBJECTIVE:   TODAY'S TREATMENT: ***  (Copy Patient Education to Plan section here)   Cheyenne Fowler 11/29/2021, 1:00 PM

## 2021-11-29 NOTE — Progress Notes (Deleted)
OUTPATIENT PHYSICAL THERAPY LOWER EXTREMITY EVALUATION   [name] MRN: [id number] DOB:05/12/1966, 55 y.o., female Today's Date: 11/29/2021    Past Medical History:  Diagnosis Date   Anemia    Bipolar disorder ([place])    Environmental and seasonal allergies 1968   Heart murmur    Prediabetes    Past Surgical History:  Procedure Laterality Date   [place]     Umbilical hernia in the 2nd grade   TUBAL LIGATION  1990   Patient Active Problem List   Diagnosis Date Noted   Cocaine use disorder, mild, abuse ([place]) 06/15/2020   Bipolar I disorder, most recent episode depressed ([place]) 06/14/2020   Menopausal syndrome 05/30/2020   Bipolar disorder ([place])    Prediabetes    Tooth infection 08/27/2018   Hemoptysis 08/27/2018   Need for Tdap vaccination 07/23/2018   Blood in stool 07/23/2018   Depression 07/13/2012   Tobacco abuse 07/13/2012   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  ONSET DATE: ***  SUBJECTIVE:   SUBJECTIVE STATEMENT: ***  PERTINENT HISTORY: ***  PAIN:  Are you having pain? {yes/no:20286} VAS scale: ***/10 Pain location: *** Pain orientation: {Pain Orientation:25161}  PAIN TYPE: {type:313116} Pain description: {PAIN DESCRIPTION:21022940}  Aggravating factors: *** Relieving factors: ***  PRECAUTIONS: {Therapy precautions:24002}  WEIGHT BEARING RESTRICTIONS {Yes ***/No:24003}  FALLS:  Has patient fallen in last 6 months? {yes/no:20286}, Number of falls: ***  LIVING ENVIRONMENT: Lives with: {[facility] lives with:25569::"lives with  their family"} Lives in: {Lives in:25570} Stairs: {yes/no:20286}; {Stairs:24000} Has following equipment at home: {Assistive devices:23999}  OCCUPATION: ***  PLOF: {PLOF:24004}  PATIENT GOALS ***   OBJECTIVE:   DIAGNOSTIC FINDINGS: ***  PATIENT SURVEYS:  {rehab surveys:24030}  COGNITION:  Overall cognitive status: {cognition:24006}     SENSATION:  Light touch: {intact/deficits:24005}  Stereognosis: {intact/deficits:24005}  Hot/Cold: {intact/deficits:24005}  Proprioception: {intact/deficits:24005}  MUSCLE LENGTH: Hamstrings: Right *** deg; Left *** deg Thomas test: Right *** deg; Left *** deg  POSTURE:  ***  LE AROM/PROM:  A/PROM Right 11/29/2021 Left 11/29/2021  Hip flexion    Hip extension    Hip abduction    Hip adduction    Hip internal rotation    Hip external rotation    Knee flexion    Knee extension    Ankle dorsiflexion    Ankle plantarflexion    Ankle inversion    Ankle eversion     (Blank rows = not tested)  LE MMT:  MMT Right 11/29/2021 Left 11/29/2021  Hip flexion    Hip extension    Hip abduction    Hip adduction    Hip internal rotation    Hip external rotation    Knee flexion    Knee extension    Ankle dorsiflexion    Ankle plantarflexion    Ankle inversion    Ankle eversion     (Blank rows = not tested)  LOWER EXTREMITY SPECIAL TESTS:  {LEspecialtests:26242}  JOINT MOBILITY ASSESSMENT:  ***  FUNCTIONAL TESTS:  {Functional tests:24029}  GAIT: Distance walked: *** Assistive device utilized: {Assistive devices:23999} Level of assistance: {Levels of  assistance:24026} Comments: ***    TODAY'S TREATMENT: ***   PATIENT EDUCATION:  Education details: *** Person educated: {Person educated:25204} Education method: {Education Method:25205} Education comprehension: {Education Comprehension:25206}   HOME EXERCISE PROGRAM: ***  ASSESSMENT:  CLINICAL IMPRESSION: Patient is a *** y.o. *** who was seen today for  physical therapy evaluation and treatment for ***. Objective impairments include {opptimpairments:25111}. These impairments are limiting patient from {activity limitations:25113}. Personal factors including {Personal factors:25162} are also affecting patient's functional outcome. Patient will benefit from skilled PT to address above impairments and improve overall function.  REHAB POTENTIAL: {rehabpotential:25112}  CLINICAL DECISION MAKING: {clinical decision making:25114}  EVALUATION COMPLEXITY: {Evaluation complexity:25115}   GOALS: Goals reviewed with patient? {yes/no:20286}  SHORT TERM GOALS:  STG Name Target Date Goal status  1 *** Baseline:  {follow up:25551} {GOALSTATUS:25110}  2 *** Baseline:  {follow up:25551} {GOALSTATUS:25110}  3 *** Baseline: {follow up:25551} {GOALSTATUS:25110}  4 *** Baseline: {follow up:25551} {GOALSTATUS:25110}  5 *** Baseline: {follow up:25551} {GOALSTATUS:25110}  6 *** Baseline: {follow up:25551} {GOALSTATUS:25110}  7 *** Baseline: {follow up:25551} {GOALSTATUS:25110}   LONG TERM GOALS:   LTG Name Target Date Goal status  1 *** Baseline: {follow up:25551} {GOALSTATUS:25110}  2 *** Baseline: {follow up:25551} {GOALSTATUS:25110}  3 *** Baseline: {follow up:25551} {GOALSTATUS:25110}  4 *** Baseline: {follow up:25551} {GOALSTATUS:25110}  5 *** Baseline: {follow up:25551} {GOALSTATUS:25110}  6 *** Baseline: {follow up:25551} {GOALSTATUS:25110}  7 *** Baseline: {follow up:25551} {GOALSTATUS:25110}   PLAN: PT FREQUENCY: {rehab frequency:25116}  PT DURATION: {rehab duration:25117}  PLANNED INTERVENTIONS: {rehab planned interventions:25118::"Therapeutic exercises","Therapeutic activity","Neuro Muscular re-education","Balance training","Gait training","Patient/Family education","Joint mobilization"}  PLAN FOR NEXT SESSION: ***   [name] 11/29/2021, 12:46 PM

## 2021-11-29 NOTE — Therapy (Incomplete)
OUTPATIENT PHYSICAL THERAPY THORACOLUMBAR EVALUATION   [name] MRN: [id number] DOB:[birth year], 55 y.o., female Today's Date: 11/29/2021    Past Medical History:  Diagnosis Date   Anemia    Bipolar disorder ([place])    Environmental and seasonal allergies 1968   Heart murmur    Prediabetes    Past Surgical History:  Procedure Laterality Date   [place]     Umbilical hernia in the 2nd grade   TUBAL LIGATION  1990   Patient Active Problem List   Diagnosis Date Noted   Cocaine use disorder, mild, abuse ([place]) 06/15/2020   Bipolar I disorder, most recent episode depressed ([place]) 06/14/2020   Menopausal syndrome 05/30/2020   Bipolar disorder ([place])    Prediabetes    Tooth infection 08/27/2018   Hemoptysis 08/27/2018   Need for Tdap vaccination 07/23/2018   Blood in stool 07/23/2018   Depression 07/13/2012   Tobacco abuse 07/13/2012   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[place]) 05/22/2012    Class: Acute   Polysubstance (excluding opioids) dependence ([place]) 05/22/2012    Class: Acute   Breast tenderness in female 03/18/2012   Health care maintenance 03/18/2012   DEPRESSION 08/17/2007    PCP: Mack Hook, MD  REFERRING PROVIDER: Newt Minion, MD  REFERRING DIAG: M54.50,G89.29 (ICD-10-CM) - Chronic midline low back pain without sciatica   THERAPY DIAG: Chronic midline low back pain without sciatica    ONSET DATE: 10/29/21  SUBJECTIVE:                                                                                                                                                                                           SUBJECTIVE STATEMENT: *** PERTINENT HISTORY:  Chronic, worsening and severe bilateral axial back pain. Patient continues to have excruciating and dilatating pain despite good conservative therapies such as rest and use of medications. Patient's clinical presentation and  exam are consistent with facet mediated pain. We also feel there could be a myofascial component to her pain as well. We believe the next step is to perform bilateral L4-L5 and L5-S1 facet joint injections under fluoroscopic guidance. If patient does well with facet joint injections we did speak with her today about the possibility of longer sustained pain relief with radiofrequency ablation. We did give her educational literature regarding radiofrequency ablation today for her to take home and review. Patient also voiced concerns about anxiety surrounding injections. We did speak with her about using oral Valium pre-procedure to help her relax and to reduce anxiety. We did place an order for oral Valium today  for her to take on the day of procedure. Patient will be attending formal physical therapy on 11/29/21 at [facility]. No red flag symptoms noted upon exam today.    PAIN:  Are you having pain? {yes/no:20286} VAS scale: ***/10 Pain location: *** Pain orientation: {Pain Orientation:25161}  PAIN TYPE: {type:313116} Pain description: {PAIN DESCRIPTION:21022940}  Aggravating factors: *** Relieving factors: ***  PRECAUTIONS: None  WEIGHT BEARING RESTRICTIONS No  FALLS:  Has patient fallen in last 6 months? {yes/no:20286}, Number of falls: ***  LIVING ENVIRONMENT: Lives with: {[facility] lives with:25569::"lives with their family"} Lives in: {Lives in:25570} Stairs: {yes/no:20286}; {Stairs:24000} Has following equipment at home: {Assistive devices:23999}  OCCUPATION: ***  PLOF: {PLOF:24004}  PATIENT GOALS ***   OBJECTIVE:   DIAGNOSTIC FINDINGS:  ***  PATIENT SURVEYS:  {rehab surveys:24030}  SCREENING FOR RED FLAGS: Bowel or bladder incontinence: {Yes/No:304960894} Spinal tumors: {Yes/No:304960894} Cauda equina syndrome: {Yes/No:304960894} Compression fracture: {Yes/No:304960894} Abdominal aneurysm: {Yes/No:304960894}  COGNITION:  Overall cognitive status:  {cognition:24006}     SENSATION:  Light touch: {intact/deficits:24005}  Stereognosis: {intact/deficits:24005}  Hot/Cold: {intact/deficits:24005}  Proprioception: {intact/deficits:24005}  MUSCLE LENGTH: Hamstrings: Right *** deg; Left *** deg Thomas test: Right *** deg; Left *** deg  POSTURE:  ***  LUMBARAROM/PROM  A/PROM A/PROM  11/29/2021  Flexion   Extension   Right lateral flexion   Left lateral flexion   Right rotation   Left rotation    (Blank rows = not tested)  LE AROM/PROM:  A/PROM Right 11/29/2021 Left 11/29/2021  Hip flexion    Hip extension    Hip abduction    Hip adduction    Hip internal rotation    Hip external rotation    Knee flexion    Knee extension    Ankle dorsiflexion    Ankle plantarflexion    Ankle inversion    Ankle eversion     (Blank rows = not tested)  LE MMT:  MMT Right 11/29/2021 Left 11/29/2021  Hip flexion    Hip extension    Hip abduction    Hip adduction    Hip internal rotation    Hip external rotation    Knee flexion    Knee extension    Ankle dorsiflexion    Ankle plantarflexion    Ankle inversion    Ankle eversion     (Blank rows = not tested)  LUMBAR SPECIAL TESTS:  {lumbar special test:25242}  SPINAL SEGMENTAL MOBILITY ASSESSMENT:  ***  FUNCTIONAL TESTS:  {Functional tests:24029}  GAIT: Distance walked: *** Assistive device utilized: {Assistive devices:23999} Level of assistance: {Levels of assistance:24026} Comments: ***    TODAY'S TREATMENT  ***   PATIENT EDUCATION:  Education details: *** Person educated: {Person educated:25204} Education method: {Education Method:25205} Education comprehension: {Education Comprehension:25206}   HOME EXERCISE PROGRAM: ***  ASSESSMENT:  CLINICAL IMPRESSION: Patient is a *** y.o. *** who was seen today for physical therapy evaluation and treatment for ***. Objective impairments include {opptimpairments:25111}. These impairments are limiting patient  from {activity limitations:25113}. Personal factors including {Personal factors:25162} are also affecting patient's functional outcome. Patient will benefit from skilled PT to address above impairments and improve overall function.  REHAB POTENTIAL: {rehabpotential:25112}  CLINICAL DECISION MAKING: {clinical decision making:25114}  EVALUATION COMPLEXITY: {Evaluation complexity:25115}   GOALS: Goals reviewed with patient? {yes/no:20286}  SHORT TERM GOALS:  STG Name Target Date Goal status  1 *** Baseline:  {follow up:25551} {GOALSTATUS:25110}  2 *** Baseline:  {follow up:25551} {GOALSTATUS:25110}  3 *** Baseline: {follow up:25551} {GOALSTATUS:25110}  4 *** Baseline: {follow up:25551} {GOALSTATUS:25110}  5 *** Baseline: {follow up:25551} {GOALSTATUS:25110}  6 *** Baseline: {follow up:25551} {GOALSTATUS:25110}  7 *** Baseline: {follow up:25551} {GOALSTATUS:25110}   LONG TERM GOALS:   LTG Name Target Date Goal status  1 *** Baseline: {follow up:25551} {GOALSTATUS:25110}  2 *** Baseline: {follow up:25551} {GOALSTATUS:25110}  3 *** Baseline: {follow up:25551} {GOALSTATUS:25110}  4 *** Baseline: {follow up:25551} {GOALSTATUS:25110}  5 *** Baseline: {follow up:25551} {GOALSTATUS:25110}  6 *** Baseline: {follow up:25551} {GOALSTATUS:25110}  7 *** Baseline: {follow up:25551} {GOALSTATUS:25110}   PLAN: PT FREQUENCY: {rehab frequency:25116}  PT DURATION: {rehab duration:25117}  PLANNED INTERVENTIONS: {rehab planned interventions:25118::"Therapeutic exercises","Therapeutic activity","Neuro Muscular re-education","Balance training","Gait training","Patient/Family education","Joint mobilization"}  PLAN FOR NEXT SESSION: ***   [name] 11/29/2021, 2:23 PM

## 2021-12-03 MED ORDER — METFORMIN HCL ER 500 MG PO TB24
ORAL_TABLET | ORAL | 11 refills | Status: DC
Start: 2021-12-03 — End: 2022-12-05

## 2021-12-03 MED ORDER — ATORVASTATIN CALCIUM 20 MG PO TABS: ORAL_TABLET | ORAL | 11 refills | Status: DC

## 2021-12-09 NOTE — Therapy (Addendum)
OUTPATIENT PHYSICAL THERAPY THORACOLUMBAR EVALUATION / Discharge   [name] MRN: [id number] DOB:08-21-1966, 55 y.o., female Today's Date: 12/10/2021   PT End of Session - 12/10/21 1415     Visit Number 1    Number of Visits 12    Date for PT Re-Evaluation 02/04/22    Authorization Type MCD    PT Start Time 1415    PT Stop Time 1455    PT Time Calculation (min) 40 min    Activity Tolerance Patient tolerated treatment well;Patient limited by pain    Behavior During Therapy [facility] for tasks assessed/performed             Past Medical History:  Diagnosis Date   Anemia    Bipolar disorder ([place])    Environmental and seasonal allergies 1968   Heart murmur    Prediabetes    Past Surgical History:  Procedure Laterality Date   [place]     Umbilical hernia in the 2nd grade   TUBAL LIGATION  1990   Patient Active Problem List   Diagnosis Date Noted   Cocaine use disorder, mild, abuse ([place]) 06/15/2020   Bipolar I disorder, most recent episode depressed ([place]) 06/14/2020   Menopausal syndrome 05/30/2020   Bipolar disorder ([place])    Prediabetes    Tooth infection 08/27/2018   Hemoptysis 08/27/2018   Need for Tdap vaccination 07/23/2018   Blood in stool 07/23/2018   Depression 07/13/2012   Tobacco abuse 07/13/2012   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    Class: Acute   Polysubstance (excluding opioids) dependence ([place]) 05/22/2012    Class: Acute   Breast tenderness in female 03/18/2012   Health care maintenance 03/18/2012   DEPRESSION 08/17/2007    PCP: Mack Hook, MD  REFERRING PROVIDER: Newt Minion, MD  REFERRING DIAG: Chronic midline low back pain without sciatica [M54.50, G89.29  THERAPY DIAG:  Chronic bilateral low back pain, unspecified whether sciatica present  Muscle weakness (generalized)  ONSET DATE: Over 10 years  SUBJECTIVE:                                                                                                                                                                                            SUBJECTIVE STATEMENT: Back has been going on for over 10 years with no specific onset or specific MOI. She reports pain stays mostly in the back that is worse with prolonged standing/ walking. She reports N/T into to the R LE in to the great toe. She denies any red flags. Since onset the back  seems to fluctuate depending on activity.  PERTINENT HISTORY:  Chronic hx of low back pain  PAIN:  Are you having pain? Yes VAS scale: 8/10 Pain location: lumbar Pain orientation: Posterior and Lower  PAIN TYPE: aching, tight, and tingling Pain description: constant  Aggravating factors: standing, walking, bending  Relieving factors:   PRECAUTIONS: None  WEIGHT BEARING RESTRICTIONS No  FALLS:  Has patient fallen in last 6 months? No, Number of falls: 0  LIVING ENVIRONMENT: Lives with: lives with their family Lives in: House/apartment Stairs: Yes; External: 4 steps; Rail on both going up Has following equipment at home: None  OCCUPATION: disabiltiy   PLOF: Independent with basic ADLs  PATIENT GOALS to decrease    OBJECTIVE:   DIAGNOSTIC FINDINGS:  MRI 10/26/2021  IMPRESSION: 1. Lumbar spine spondylosis as described above. 2. No acute osseous injury of the lumbar spine.  PATIENT SURVEYS:  MCD  SCREENING FOR RED FLAGS: Bowel or bladder incontinence: No Cauda equina syndrome: No  COGNITION:  Overall cognitive status: Within functional limits for tasks assessed     SENSATION:  Light touch: Deficits diminished sensation in the R L5 dermatome   POSTURE:  FHP, rolled shoulders  PALPATION: TTP along bil lumbar paraspinals with multiple trigger points noted.   LUMBARAROM/PROM  A/PROM A/PROM  12/10/2021  Flexion 20  Extension 10  Right lateral flexion 10 ipsilateral pain  Left lateral flexion 10   contralateral pain  Right rotation   Left rotation    (Blank rows = not tested)  LE AROM/PROM:  A/PROM Right 12/10/2021 Left 12/10/2021  Hip flexion    Hip extension    Hip abduction    Hip adduction    Hip internal rotation    Hip external rotation    Knee flexion    Knee extension    Ankle dorsiflexion    Ankle plantarflexion    Ankle inversion    Ankle eversion     (Blank rows = not tested)  LE MMT:  MMT Right 12/10/2021 Left 12/10/2021  Hip flexion 3+/5 3+/5  Hip extension 3+/5 3+/5  Hip abduction 3+/5 3+/5  Hip adduction 3+/5 3+5  Hip internal rotation    Hip external rotation    Knee flexion 3+/5 3+/5  Knee extension 4/5 4-/5  Ankle dorsiflexion    Ankle plantarflexion    Ankle inversion    Ankle eversion     (Blank rows = not tested)  LUMBAR SPECIAL TESTS:  Straight leg raise test: Negative and Slump test: Negative  FUNCTIONAL TESTS:  5 times sit to stand: 18 seconds  GAIT: Distance walked:  Assistive device utilized: None Level of assistance: Complete Independence Comments: antalgic pattern with limited stride,     TODAY'S TREATMENT   [facility] Adult PT Treatment:     DATE: 12/10/2021 Therapeutic Exercise: LTR 1 x 10 Posterior pelvic tilt 1 x 10 Manual Therapy:  MTPR  lumbar paraspinals on the R /L 1 ea.        PATIENT EDUCATION:  Education details: Evaluation findings, POC, goals, HEP with proper form/ rationale.  Person educated: Patient Education method: Customer service manager Education comprehension: verbalized understanding   HOME EXERCISE PROGRAM: Access Code: [email or web address] URL: [email or web address] Date: 12/10/2021 Prepared by: Starr Lake  Exercises Supine Posterior Pelvic Tilt - 1 x daily - 7 x weekly - 2 sets - 10 reps Supine March - 1 x daily - 7 x weekly - 2 sets - 10 reps - 5 hold Lower Trunk Rotation - 1 x  daily - 7 x weekly - 2 sets - 10 reps Hooklying Clamshell with Resistance - 1 x  daily - 7 x weekly - 2 sets - 10 reps   ASSESSMENT:  CLINICAL IMPRESSION: Patient is a 55 y.o. F who was seen today for physical therapy evaluation and treatment for chronic low back pain that started over 10 years ago. She demonstrates gross limitation of trunk mobility and gen weakness noted in bil LE. TTP along bil lumbar paraspinals with multiple trigger points noted. Altered sensation in the RLE along the R L5 dermatome  Objective impairments include decreased activity tolerance, decreased endurance, decreased mobility, decreased ROM, decreased strength, hypomobility, increased fascial restrictions, impaired flexibility, postural dysfunction, obesity, and pain. These impairments are limiting patient from cleaning, laundry, and walking/ standing . Personal factors including Age and 1 comorbidity: Prediabetes  are also affecting patient's functional outcome. She responded well to [facility] and core activation noting some relief of pain in session. Patient will benefit from skilled PT to address above impairments and improve overall function.  REHAB POTENTIAL: Good  CLINICAL DECISION MAKING: Evolving/moderate complexity  EVALUATION COMPLEXITY: Moderate   GOALS: Goals reviewed with patient? Yes  SHORT TERM GOALS:  STG Name Target Date Goal status  1 Pt to be IND with initial HEP Baseline: no previous HEP 12/31/2021 INITIAL  2 Pt to verbalize/ demo efficient posture and lifting mechanics to reduce and prevent low back pain  Baseline: no previous knowledge of posture 12/31/2021 INITIAL   LONG TERM GOALS:   LTG Name Target Date Goal status  1 Pt to be able to walk/ stand for >/= 45 min for in home and short community amb with </= 4/10 max pain  Baseline: max pain 8/10 with any standing/ walking 01/21/2022 INITIAL  2 Increase gross LE strength to >/= 4/5 to promote hip stability and assist with efficient lifting mechanics for ADLs Baseline: gross weakness at 3+/5, except for 4/5 & 4-/5 for knee  extension 01/21/2022 INITIAL  3 Improve 5 x sit to stand to </= 13 seconds to demo improvement in function Baseline: iniital assessment 18 seconds 01/21/2022 INITIAL  4 Pt to be IND with all HEP and is able to maintain and progress current LOF IND Baseline: no previous HEP 01/21/2022 INITIAL   PLAN: PT FREQUENCY:  1 x week for 3 weeks for initial MCD auth, then progress to 2 x week for 4 weeks  PT DURATION: 8 weeks  PLANNED INTERVENTIONS: Therapeutic exercises, Therapeutic activity, Neuro Muscular re-education, Balance training, Gait training, Patient/Family education, Joint mobilization, Stair training, Aquatic Therapy, Dry Needling, Electrical stimulation, Cryotherapy, Moist heat, Taping, Traction, Ultrasound, Ionotophoresis 4mg /ml Dexamethasone, and Manual therapy  PLAN FOR NEXT SESSION: Review/ update HEP PRN, Did she get in with aquatic therapy, gross hip/ core strengthening, STW along lumbar paraspinals and MTPR (hold TPDN pt fearful of needles)  Elpidio Thielen PT, DPT, LAT, ATC  12/10/21  3:02 PM           Pt self discharged from physical therapy, current status unknown as she didn't return since her evaluation.  Asanti Craigo PT, DPT, LAT, ATC  01/01/22  1:55 PM

## 2021-12-10 ENCOUNTER — Encounter: Payer: Self-pay | Admitting: Physical Therapy

## 2021-12-10 ENCOUNTER — Other Ambulatory Visit: Payer: Self-pay

## 2021-12-10 ENCOUNTER — Ambulatory Visit: Payer: Medicaid Other | Attending: Orthopedic Surgery | Admitting: Physical Therapy

## 2021-12-10 DIAGNOSIS — G8929 Other chronic pain: Secondary | ICD-10-CM | POA: Insufficient documentation

## 2021-12-10 DIAGNOSIS — M545 Low back pain, unspecified: Secondary | ICD-10-CM | POA: Diagnosis not present

## 2021-12-10 DIAGNOSIS — M6281 Muscle weakness (generalized): Secondary | ICD-10-CM | POA: Diagnosis present

## 2021-12-10 NOTE — Patient Instructions (Signed)
Aquatic Therapy at [facility]-  What to Expect!  Where:   [facility] [address] [place], [place] [address] Rehab phone [phone]  NOTE:  You will receive an automated phone message reminding you of your appt and it will say the appointment is at the [address] [facility] [address] [facility] clinic.          How to Prepare: Please make sure you drink 8 ounces of water about one hour prior to your pool session A caregiver may attend if needed with the patient to help assist as needed. A caregiver can sit in the pool room on chair. Please arrive IN YOUR SUIT and 15 minutes prior to your appointment - this helps to avoid delays in starting your session. Please make sure to attend to any toileting needs prior to entering the pool Locker rooms for changing are provided.   There is direct access to the pool deck form the locker room.  You can lock your belongings in a locker with lock provided. Once on the pool deck your therapist will ask if you have signed the Patient  Consent and Assignment of Benefits form before beginning treatment Your therapist may take your blood pressure prior to, during and after your session if indicated We usually try and create a home exercise program based on activities we do in the pool.  Please be thinking about who might be able to assist you in the pool should you need to participate in an aquatic home exercise program at the time of discharge if you need assistance.  Some patients do not want to or do not have the ability to participate in an aquatic home program - this is not a barrier in any way to you participating in aquatic therapy as part of your current therapy plan! After Discharge from PT, you can continue using home program at  the [facility]/, there is a drop-in fee for $5 ($45 a month)or for 60 years  or older $4.00 ($40 a month for seniors ) or any local [employer] pool.  Memberships for purchase are  available for gym/pool at [facility]  IT IS VERY IMPORTANT THAT YOUR LAST VISIT BE IN THE CLINIC AT [address] AFTER YOUR LAST AQUATIC VISIT.  PLEASE MAKE SURE THAT YOU HAVE A [address]  APPOINTMENT SCHEDULED.   About the pool: Pool is located approximately 500 FT from the entrance of the building.  Please bring a support person if you need assistance traveling this      distance.   Your therapist will assist you in entering the water; there are two ways to           enter: stairs with railings, and a mechanical lift. Your therapist will determine the most appropriate way for you.  Water temperature is usually between 88-90 degrees  There may be up to 2 other swimmers in the pool at the same time  The pool deck is tile, please wear shoes with good traction if you prefer not to be barefoot.    Contact Info:  For appointment scheduling and cancellations:         Please call the [facility]  PH:[phone]              Aquatic Therapy  Outpatient [facility]       All sessions are 45 minutes                                                    

## 2021-12-13 ENCOUNTER — Ambulatory Visit: Payer: Self-pay

## 2021-12-13 ENCOUNTER — Encounter: Payer: Self-pay | Admitting: Physical Medicine and Rehabilitation

## 2021-12-13 ENCOUNTER — Ambulatory Visit (INDEPENDENT_AMBULATORY_CARE_PROVIDER_SITE_OTHER): Payer: Medicaid Other | Admitting: Physical Medicine and Rehabilitation

## 2021-12-13 ENCOUNTER — Other Ambulatory Visit: Payer: Self-pay

## 2021-12-13 VITALS — BP 108/69 | HR 92

## 2021-12-13 DIAGNOSIS — M47816 Spondylosis without myelopathy or radiculopathy, lumbar region: Secondary | ICD-10-CM

## 2021-12-13 DIAGNOSIS — G8929 Other chronic pain: Secondary | ICD-10-CM

## 2021-12-13 MED ORDER — BUPIVACAINE HCL 0.5 % IJ SOLN: 3.0000 mL | Freq: Once | INTRAMUSCULAR | Status: DC

## 2021-12-13 NOTE — Patient Instructions (Signed)

## 2021-12-13 NOTE — Progress Notes (Signed)
Pt state lower back pain. Pt state walking and standing makes the pain worse. Pt state she takes pain meds to help ease her pain.  Numeric Pain Rating Scale and Functional Assessment Average Pain 7   In the last MONTH (on 0-10 scale) has pain interfered with the following?  1. General activity like being  able to carry out your everyday physical activities such as walking, climbing stairs, carrying groceries, or moving a chair?  Rating(10)   +Driver, -BT, -Dye Allergies.

## 2021-12-25 ENCOUNTER — Telehealth: Payer: Self-pay | Admitting: Physical Therapy

## 2021-12-25 ENCOUNTER — Ambulatory Visit: Payer: Medicaid Other | Attending: Orthopedic Surgery | Admitting: Physical Therapy

## 2021-12-25 NOTE — Telephone Encounter (Signed)
Left message explaining attendance policy and that 05-28-79 was a  NO SHOW.  Also left reminder for aquatic therapy appt tomorrow at 1:30.  Pt was asked to return call and confirm that she will attend aquatic therapy.  Voncille Lo, PT, [place] Certified Exercise Expert for the Aging Adult  12/25/21 3:36 PM Phone: [phone] Fax: [phone]

## 2021-12-25 NOTE — Therapy (Incomplete)
OUTPATIENT PHYSICAL THERAPY TREATMENT NOTE   [name] MRN: [id number] DOB:12/21/1966, 56 y.o., female Today's Date: 12/25/2021  PCP: Mack Hook, MD REFERRING PROVIDER: Mack Hook, MD    Past Medical History:  Diagnosis Date   Anemia    Bipolar disorder ([place])    Environmental and seasonal allergies 1968   Heart murmur    Prediabetes    Past Surgical History:  Procedure Laterality Date   [place]     Umbilical hernia in the 2nd grade   TUBAL LIGATION  1990   Patient Active Problem List   Diagnosis Date Noted   Cocaine use disorder, mild, abuse ([place]) 06/15/2020   Bipolar I disorder, most recent episode depressed ([place]) 06/14/2020   Menopausal syndrome 05/30/2020   Bipolar disorder ([place])    Prediabetes    Tooth infection 08/27/2018   Hemoptysis 08/27/2018   Need for Tdap vaccination 07/23/2018   Blood in stool 07/23/2018   Depression 07/13/2012   Tobacco abuse 07/13/2012   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  PERTINENT HISTORY: ***  PRECAUTIONS: ***  SUBJECTIVE: ***  PAIN:  Are you having pain? {yes/no:20286} VAS scale: ***/10 Pain location: *** Pain orientation: {Pain Orientation:25161}  PAIN TYPE: {type:313116} Pain description: {PAIN DESCRIPTION:21022940}  Aggravating factors: *** Relieving factors: ***     OBJECTIVE:   Unless otherwise stated, all objective measures taken at evaluation   DIAGNOSTIC FINDINGS:  MRI 10/26/2021  IMPRESSION: 1. Lumbar spine spondylosis as described above. 2. No acute osseous injury of the lumbar spine.   PATIENT SURVEYS:  MCD   SCREENING FOR  RED FLAGS: Bowel or bladder incontinence: No Cauda equina syndrome: No   COGNITION:          Overall cognitive status: Within functional limits for tasks assessed                        SENSATION:          Light touch: Deficits diminished sensation in the R L5 dermatome     POSTURE:  FHP, rolled shoulders   PALPATION: TTP along bil lumbar paraspinals with multiple trigger points noted.    LUMBARAROM/PROM   A/PROM A/PROM  12/10/2021  Flexion 20  Extension 10  Right lateral flexion 10 ipsilateral pain  Left lateral flexion 10  contralateral pain  Right rotation    Left rotation     (Blank rows = not tested)   LE AROM/PROM:   A/PROM Right 12/10/2021 Left 12/10/2021  Hip flexion      Hip extension      Hip abduction      Hip adduction      Hip internal rotation      Hip external rotation      Knee flexion      Knee extension      Ankle dorsiflexion      Ankle plantarflexion      Ankle inversion      Ankle eversion       (Blank rows = not tested)   LE MMT:   MMT Right 12/10/2021 Left 12/10/2021  Hip flexion 3+/5 3+/5  Hip extension  3+/5 3+/5  Hip abduction 3+/5 3+/5  Hip adduction 3+/5 3+5  Hip internal rotation      Hip external rotation      Knee flexion 3+/5 3+/5  Knee extension 4/5 4-/5  Ankle dorsiflexion      Ankle plantarflexion      Ankle inversion      Ankle eversion       (Blank rows = not tested)   LUMBAR SPECIAL TESTS:  Straight leg raise test: Negative and Slump test: Negative   FUNCTIONAL TESTS:  5 times sit to stand: 18 seconds   GAIT: Distance walked:  Assistive device utilized: None Level of assistance: Complete Independence Comments: antalgic pattern with limited stride,        TODAY'S TREATMENT    [facility] Adult PT Treatment:     DATE: 12/10/2021 Therapeutic Exercise: LTR 1 x 10 Posterior pelvic tilt 1 x 10 Manual Therapy:  MTPR  lumbar paraspinals on the R /L 1 ea.              PATIENT EDUCATION:  Education  details: Evaluation findings, POC, goals, HEP with proper form/ rationale.  Person educated: Patient Education method: Customer service manager Education comprehension: verbalized understanding     HOME EXERCISE PROGRAM: Access Code: [email or web address] URL: [email or web address] Date: 12/10/2021 Prepared by: Starr Lake   Exercises Supine Posterior Pelvic Tilt - 1 x daily - 7 x weekly - 2 sets - 10 reps Supine March - 1 x daily - 7 x weekly - 2 sets - 10 reps - 5 hold Lower Trunk Rotation - 1 x daily - 7 x weekly - 2 sets - 10 reps Hooklying Clamshell with Resistance - 1 x daily - 7 x weekly - 2 sets - 10 reps     ASSESSMENT:   CLINICAL IMPRESSION: Patient is a 56 y.o. F who was seen today for physical therapy evaluation and treatment for chronic low back pain that started over 10 years ago. She demonstrates gross limitation of trunk mobility and gen weakness noted in bil LE. TTP along bil lumbar paraspinals with multiple trigger points noted. Altered sensation in the RLE along the R L5 dermatome  Objective impairments include decreased activity tolerance, decreased endurance, decreased mobility, decreased ROM, decreased strength, hypomobility, increased fascial restrictions, impaired flexibility, postural dysfunction, obesity, and pain. These impairments are limiting patient from cleaning, laundry, and walking/ standing . Personal factors including Age and 1 comorbidity: Prediabetes  are also affecting patient's functional outcome. She responded well to [facility] and core activation noting some relief of pain in session. Patient will benefit from skilled PT to address above impairments and improve overall function.   REHAB POTENTIAL: Good   CLINICAL DECISION MAKING: Evolving/moderate complexity   EVALUATION COMPLEXITY: Moderate     GOALS: Goals reviewed with patient? Yes   SHORT TERM GOALS:   STG Name Target Date Goal status  1 Pt to be IND with initial HEP Baseline:  no previous HEP 12/31/2021 INITIAL  2 Pt to verbalize/ demo efficient posture and lifting mechanics to reduce and prevent low back pain  Baseline: no previous knowledge of posture 12/31/2021 INITIAL    LONG TERM GOALS:    LTG Name Target Date Goal status  1 Pt to be able to walk/ stand for >/= 45 min for in home and short community amb with </= 4/10 max pain  Baseline: max pain 8/10 with any standing/ walking 01/21/2022 INITIAL  2 Increase gross LE strength to >/= 4/5 to promote hip  stability and assist with efficient lifting mechanics for ADLs Baseline: gross weakness at 3+/5, except for 4/5 & 4-/5 for knee extension 01/21/2022 INITIAL  3 Improve 5 x sit to stand to </= 13 seconds to demo improvement in function Baseline: iniital assessment 18 seconds 01/21/2022 INITIAL  4 Pt to be IND with all HEP and is able to maintain and progress current LOF IND Baseline: no previous HEP 01/21/2022 INITIAL    PLAN: PT FREQUENCY:  1 x week for 3 weeks for initial MCD auth, then progress to 2 x week for 4 weeks   PT DURATION: 8 weeks   PLANNED INTERVENTIONS: Therapeutic exercises, Therapeutic activity, Neuro Muscular re-education, Balance training, Gait training, Patient/Family education, Joint mobilization, Stair training, Aquatic Therapy, Dry Needling, Electrical stimulation, Cryotherapy, Moist heat, Taping, Traction, Ultrasound, Ionotophoresis 4mg /ml Dexamethasone, and Manual therapy   PLAN FOR NEXT SESSION: Review/ update HEP PRN, Did she get in with aquatic therapy, gross hip/ core strengthening, STW along lumbar paraspinals and MTPR (hold TPDN pt fearful of needles) )   Dorothea Ogle 12/25/2021, 7:38 AM

## 2021-12-26 ENCOUNTER — Ambulatory Visit: Payer: Medicaid Other | Admitting: Physical Therapy

## 2021-12-26 ENCOUNTER — Telehealth (HOSPITAL_COMMUNITY): Payer: Self-pay

## 2021-12-26 NOTE — Telephone Encounter (Signed)
[place] TRACKS PRESCRIPTION COVERAGE APPROVED  QUETIAPINE 200MG  TABLET PA # [id number] EFFECTIVE 12/26/2021 TO 12/30 2023  S/W [place] REFERENCE ID # [id number]  CALLED PHARMACY/PT

## 2021-12-31 ENCOUNTER — Ambulatory Visit: Payer: Medicaid Other | Admitting: Physical Therapy

## 2022-01-02 ENCOUNTER — Ambulatory Visit: Payer: Medicaid Other | Admitting: Physical Therapy

## 2022-01-07 ENCOUNTER — Ambulatory Visit: Payer: Medicaid Other | Admitting: Physical Therapy

## 2022-01-09 ENCOUNTER — Ambulatory Visit: Payer: Medicaid Other | Admitting: Physical Therapy

## 2022-01-11 ENCOUNTER — Ambulatory Visit (HOSPITAL_COMMUNITY): Payer: Medicaid Other | Admitting: Clinical

## 2022-01-11 ENCOUNTER — Telehealth (HOSPITAL_COMMUNITY): Payer: Self-pay | Admitting: Clinical

## 2022-01-11 ENCOUNTER — Encounter (HOSPITAL_COMMUNITY): Payer: Self-pay

## 2022-01-11 NOTE — Telephone Encounter (Signed)
Therapist called client via TELEPHONE due to not present for his scheduled in-person therapy appointment.  Client reported she will come see this therapist on the next walkin day which is January 25.

## 2022-01-15 ENCOUNTER — Telehealth: Payer: Self-pay

## 2022-01-15 ENCOUNTER — Emergency Department (HOSPITAL_COMMUNITY): Payer: Medicaid Other

## 2022-01-15 ENCOUNTER — Inpatient Hospital Stay (HOSPITAL_COMMUNITY)
Admission: EM | Admit: 2022-01-15 | Discharge: 2022-01-18 | DRG: 378 | Disposition: A | Discharge status: Home / Self Care | Payer: Medicaid Other | Attending: Internal Medicine | Admitting: Internal Medicine

## 2022-01-15 ENCOUNTER — Other Ambulatory Visit: Payer: Self-pay

## 2022-01-15 DIAGNOSIS — F3181 Bipolar II disorder: Secondary | ICD-10-CM | POA: Diagnosis present

## 2022-01-15 DIAGNOSIS — Z20822 Contact with and (suspected) exposure to covid-19: Secondary | ICD-10-CM | POA: Diagnosis present

## 2022-01-15 DIAGNOSIS — F1090 Alcohol use, unspecified, uncomplicated: Secondary | ICD-10-CM | POA: Diagnosis present

## 2022-01-15 DIAGNOSIS — F1410 Cocaine abuse, uncomplicated: Secondary | ICD-10-CM | POA: Diagnosis present

## 2022-01-15 DIAGNOSIS — F1010 Alcohol abuse, uncomplicated: Secondary | ICD-10-CM | POA: Diagnosis present

## 2022-01-15 DIAGNOSIS — E1159 Type 2 diabetes mellitus with other circulatory complications: Secondary | ICD-10-CM | POA: Diagnosis present

## 2022-01-15 DIAGNOSIS — K209 Esophagitis, unspecified without bleeding: Secondary | ICD-10-CM

## 2022-01-15 DIAGNOSIS — Z818 Family history of other mental and behavioral disorders: Secondary | ICD-10-CM

## 2022-01-15 DIAGNOSIS — F17210 Nicotine dependence, cigarettes, uncomplicated: Secondary | ICD-10-CM | POA: Diagnosis present

## 2022-01-15 DIAGNOSIS — Z6839 Body mass index (BMI) 39.0-39.9, adult: Secondary | ICD-10-CM

## 2022-01-15 DIAGNOSIS — K760 Fatty (change of) liver, not elsewhere classified: Secondary | ICD-10-CM | POA: Diagnosis present

## 2022-01-15 DIAGNOSIS — Z833 Family history of diabetes mellitus: Secondary | ICD-10-CM

## 2022-01-15 DIAGNOSIS — Z79899 Other long term (current) drug therapy: Secondary | ICD-10-CM

## 2022-01-15 DIAGNOSIS — E669 Obesity, unspecified: Secondary | ICD-10-CM | POA: Diagnosis present

## 2022-01-15 DIAGNOSIS — K920 Hematemesis: Secondary | ICD-10-CM | POA: Diagnosis present

## 2022-01-15 DIAGNOSIS — K922 Gastrointestinal hemorrhage, unspecified: Secondary | ICD-10-CM

## 2022-01-15 DIAGNOSIS — K2100 Gastro-esophageal reflux disease with esophagitis, without bleeding: Secondary | ICD-10-CM | POA: Diagnosis present

## 2022-01-15 DIAGNOSIS — M47816 Spondylosis without myelopathy or radiculopathy, lumbar region: Secondary | ICD-10-CM | POA: Diagnosis present

## 2022-01-15 DIAGNOSIS — E119 Type 2 diabetes mellitus without complications: Secondary | ICD-10-CM

## 2022-01-15 DIAGNOSIS — Z800 Family history of malignant neoplasm of digestive organs: Secondary | ICD-10-CM

## 2022-01-15 DIAGNOSIS — K2970 Gastritis, unspecified, without bleeding: Secondary | ICD-10-CM

## 2022-01-15 DIAGNOSIS — T80818A Extravasation of other vesicant agent, initial encounter: Secondary | ICD-10-CM | POA: Diagnosis present

## 2022-01-15 DIAGNOSIS — K2990 Gastroduodenitis, unspecified, without bleeding: Secondary | ICD-10-CM | POA: Diagnosis present

## 2022-01-15 DIAGNOSIS — Z803 Family history of malignant neoplasm of breast: Secondary | ICD-10-CM

## 2022-01-15 DIAGNOSIS — F3130 Bipolar disorder, current episode depressed, mild or moderate severity, unspecified: Secondary | ICD-10-CM | POA: Diagnosis present

## 2022-01-15 DIAGNOSIS — G8929 Other chronic pain: Secondary | ICD-10-CM | POA: Diagnosis present

## 2022-01-15 DIAGNOSIS — Z789 Other specified health status: Secondary | ICD-10-CM

## 2022-01-15 DIAGNOSIS — I152 Hypertension secondary to endocrine disorders: Secondary | ICD-10-CM | POA: Diagnosis present

## 2022-01-15 DIAGNOSIS — Z7984 Long term (current) use of oral hypoglycemic drugs: Secondary | ICD-10-CM

## 2022-01-15 DIAGNOSIS — K2921 Alcoholic gastritis with bleeding: Principal | ICD-10-CM | POA: Diagnosis present

## 2022-01-15 DIAGNOSIS — E785 Hyperlipidemia, unspecified: Secondary | ICD-10-CM | POA: Diagnosis present

## 2022-01-15 DIAGNOSIS — E1169 Type 2 diabetes mellitus with other specified complication: Secondary | ICD-10-CM | POA: Diagnosis present

## 2022-01-15 HISTORY — DX: Gastrointestinal hemorrhage, unspecified: K92.2

## 2022-01-15 LAB — CBC WITH DIFFERENTIAL/PLATELET
Abs Immature Granulocytes: 0.05 10*3/uL (ref 0.00–0.07)
Abs Immature Granulocytes: 0.08 10*3/uL — ABNORMAL HIGH (ref 0.00–0.07)
Basophils Absolute: 0 10*3/uL (ref 0.0–0.1)
Basophils Absolute: 0 10*3/uL (ref 0.0–0.1)
Basophils Relative: 0 %
Basophils Relative: 0 %
Eosinophils Absolute: 0.2 10*3/uL (ref 0.0–0.5)
Eosinophils Absolute: 0.1 10*3/uL (ref 0.0–0.5)
Eosinophils Relative: 2 %
Eosinophils Relative: 1 %
HCT: 44.8 % (ref 36.0–46.0)
HCT: 45.9 % (ref 36.0–46.0)
Hemoglobin: 14.2 g/dL (ref 12.0–15.0)
Hemoglobin: 14.4 g/dL (ref 12.0–15.0)
Immature Granulocytes: 1 %
Immature Granulocytes: 1 %
Lymphocytes Relative: 39 %
Lymphocytes Relative: 28 %
Lymphs Abs: 4.3 10*3/uL — ABNORMAL HIGH (ref 0.7–4.0)
Lymphs Abs: 2.8 10*3/uL (ref 0.7–4.0)
MCH: 29 pg (ref 26.0–34.0)
MCH: 28.7 pg (ref 26.0–34.0)
MCHC: 31.7 g/dL (ref 30.0–36.0)
MCHC: 31.4 g/dL (ref 30.0–36.0)
MCV: 91.4 fL (ref 80.0–100.0)
MCV: 91.4 fL (ref 80.0–100.0)
Monocytes Absolute: 0.9 10*3/uL (ref 0.1–1.0)
Monocytes Absolute: 0.8 10*3/uL (ref 0.1–1.0)
Monocytes Relative: 8 %
Monocytes Relative: 8 %
Neutro Abs: 5.4 10*3/uL (ref 1.7–7.7)
Neutro Abs: 6.3 10*3/uL (ref 1.7–7.7)
Neutrophils Relative %: 50 %
Neutrophils Relative %: 62 %
Platelets: 261 10*3/uL (ref 150–400)
Platelets: 266 10*3/uL (ref 150–400)
RBC: 4.9 MIL/uL (ref 3.87–5.11)
RBC: 5.02 MIL/uL (ref 3.87–5.11)
RDW: 12.9 % (ref 11.5–15.5)
RDW: 13 % (ref 11.5–15.5)
WBC: 10.8 10*3/uL — ABNORMAL HIGH (ref 4.0–10.5)
WBC: 10.1 10*3/uL (ref 4.0–10.5)
nRBC: 0 % (ref 0.0–0.2)
nRBC: 0 % (ref 0.0–0.2)

## 2022-01-15 LAB — PROTIME-INR
INR: 1 (ref 0.8–1.2)
Prothrombin Time: 13.1 seconds (ref 11.4–15.2)

## 2022-01-15 LAB — HEPATIC FUNCTION PANEL
ALT: 68 U/L — ABNORMAL HIGH (ref 0–44)
AST: 59 U/L — ABNORMAL HIGH (ref 15–41)
Albumin: 3.8 g/dL (ref 3.5–5.0)
Alkaline Phosphatase: 73 U/L (ref 38–126)
Bilirubin, Direct: <0.1 mg/dL (ref 0.0–0.2)
Total Bilirubin: 0.4 mg/dL (ref 0.3–1.2)
Total Protein: 6.9 g/dL (ref 6.5–8.1)

## 2022-01-15 LAB — CBG MONITORING, ED
Glucose-Capillary: 118 mg/dL — ABNORMAL HIGH (ref 70–99)
Glucose-Capillary: 119 mg/dL — ABNORMAL HIGH (ref 70–99)

## 2022-01-15 LAB — BASIC METABOLIC PANEL
Anion gap: 10 (ref 5–15)
BUN: 7 mg/dL (ref 6–20)
CO2: 23 mmol/L (ref 22–32)
Calcium: 9 mg/dL (ref 8.9–10.3)
Chloride: 108 mmol/L (ref 98–111)
Creatinine, Ser: 0.94 mg/dL (ref 0.44–1.00)
GFR, Estimated: >60 mL/min (ref >60)
Glucose, Bld: 150 mg/dL — ABNORMAL HIGH (ref 70–99)
Potassium: 3.8 mmol/L (ref 3.5–5.1)
Sodium: 141 mmol/L (ref 135–145)

## 2022-01-15 LAB — ETHANOL: Alcohol, Ethyl (B): <10 mg/dL (ref <10)

## 2022-01-15 LAB — LIPASE, BLOOD: Lipase: 33 U/L (ref 11–51)

## 2022-01-15 LAB — URINALYSIS, ROUTINE W REFLEX MICROSCOPIC
Bilirubin Urine: NEGATIVE
Glucose, UA: NEGATIVE mg/dL
Hgb urine dipstick: NEGATIVE
Ketones, ur: NEGATIVE mg/dL
Leukocytes,Ua: NEGATIVE
Nitrite: NEGATIVE
Protein, ur: NEGATIVE mg/dL
Specific Gravity, Urine: 1.018 (ref 1.005–1.030)
pH: 5 (ref 5.0–8.0)

## 2022-01-15 LAB — TROPONIN I (HIGH SENSITIVITY)
Troponin I (High Sensitivity): 5 ng/L (ref <18)
Troponin I (High Sensitivity): 9 ng/L (ref <18)

## 2022-01-15 LAB — RESP PANEL BY RT-PCR (FLU A&B, COVID) ARPGX2
Influenza A by PCR: NEGATIVE
Influenza B by PCR: NEGATIVE
SARS Coronavirus 2 by RT PCR: NEGATIVE

## 2022-01-15 LAB — SAMPLE TO BLOOD BANK

## 2022-01-15 LAB — POC OCCULT BLOOD, ED: Fecal Occult Bld: NEGATIVE

## 2022-01-15 MED ORDER — ONDANSETRON HCL 4 MG/2ML IJ SOLN
4.0000 mg | Freq: Four times a day (QID) | INTRAMUSCULAR | Status: DC | PRN
Start: 2022-01-15 — End: 2022-01-18

## 2022-01-15 MED ORDER — GABAPENTIN 100 MG PO CAPS
100.0000 mg | ORAL_CAPSULE | Freq: Every day | ORAL | Status: DC
  Administered 2022-01-16 – 2022-01-17 (×2): 100 mg via ORAL
  Filled 2022-01-15 (×2): qty 1

## 2022-01-15 MED ORDER — ATORVASTATIN CALCIUM 10 MG PO TABS
20.0000 mg | ORAL_TABLET | Freq: Every evening | ORAL | Status: DC
  Administered 2022-01-16 – 2022-01-17 (×2): 20 mg via ORAL
  Filled 2022-01-15 (×2): qty 2

## 2022-01-15 MED ORDER — HYDROCODONE-ACETAMINOPHEN 5-325 MG PO TABS
1.0000 | ORAL_TABLET | Freq: Once | ORAL | Status: AC
  Administered 2022-01-15: 08:00:00 1 via ORAL
  Filled 2022-01-15: qty 1

## 2022-01-15 MED ORDER — HYDROXYZINE HCL 25 MG PO TABS: 50.0000 mg | ORAL_TABLET | Freq: Three times a day (TID) | ORAL | Status: DC | PRN

## 2022-01-15 MED ORDER — PANTOPRAZOLE SODIUM 40 MG IV SOLR
40.0000 mg | Freq: Two times a day (BID) | INTRAVENOUS | Status: DC
  Administered 2022-01-16 (×2): 40 mg via INTRAVENOUS
  Filled 2022-01-15 (×2): qty 40

## 2022-01-15 MED ORDER — LAMOTRIGINE 100 MG PO TABS
100.0000 mg | ORAL_TABLET | Freq: Every day | ORAL | Status: DC
  Administered 2022-01-16 – 2022-01-18 (×2): 100 mg via ORAL
  Filled 2022-01-15 (×3): qty 1

## 2022-01-15 MED ORDER — ACETAMINOPHEN 325 MG PO TABS
650.0000 mg | ORAL_TABLET | Freq: Four times a day (QID) | ORAL | Status: DC | PRN
  Administered 2022-01-16: 12:00:00 650 mg via ORAL
  Filled 2022-01-15 (×2): qty 2

## 2022-01-15 MED ORDER — ACETAMINOPHEN 650 MG RE SUPP: 650.0000 mg | Freq: Four times a day (QID) | RECTAL | Status: DC | PRN

## 2022-01-15 MED ORDER — PANTOPRAZOLE SODIUM 40 MG IV SOLR
40.0000 mg | Freq: Once | INTRAVENOUS | Status: AC
  Administered 2022-01-15: 17:00:00 40 mg via INTRAVENOUS
  Filled 2022-01-15: qty 40

## 2022-01-15 MED ORDER — INSULIN ASPART 100 UNIT/ML IJ SOLN
0.0000 [IU] | INTRAMUSCULAR | Status: DC
  Administered 2022-01-16 – 2022-01-18 (×7): 1 [IU] via SUBCUTANEOUS

## 2022-01-15 MED ORDER — LOSARTAN POTASSIUM 50 MG PO TABS
50.0000 mg | ORAL_TABLET | Freq: Every day | ORAL | Status: DC
  Administered 2022-01-16 – 2022-01-18 (×2): 50 mg via ORAL
  Filled 2022-01-15 (×2): qty 1

## 2022-01-15 MED ORDER — ONDANSETRON HCL 4 MG PO TABS
4.0000 mg | ORAL_TABLET | Freq: Four times a day (QID) | ORAL | Status: DC | PRN
Start: 2022-01-15 — End: 2022-01-18

## 2022-01-15 MED ORDER — ONDANSETRON 4 MG PO TBDP
4.0000 mg | ORAL_TABLET | Freq: Once | ORAL | Status: AC
  Administered 2022-01-15: 08:00:00 4 mg via ORAL
  Filled 2022-01-15: qty 1

## 2022-01-15 MED ORDER — TRAMADOL HCL 50 MG PO TABS
50.0000 mg | ORAL_TABLET | Freq: Two times a day (BID) | ORAL | Status: DC | PRN
  Administered 2022-01-15 – 2022-01-17 (×2): 50 mg via ORAL
  Filled 2022-01-15 (×2): qty 1

## 2022-01-15 MED ORDER — QUETIAPINE FUMARATE 50 MG PO TABS
200.0000 mg | ORAL_TABLET | Freq: Every day | ORAL | Status: DC
  Administered 2022-01-16 – 2022-01-17 (×2): 200 mg via ORAL
  Filled 2022-01-15: qty 4
  Filled 2022-01-15 (×2): qty 1
  Filled 2022-01-15: qty 4

## 2022-01-15 MED ORDER — ONDANSETRON 4 MG PO TBDP
4.0000 mg | ORAL_TABLET | Freq: Once | ORAL | Status: AC
  Administered 2022-01-15: 15:00:00 4 mg via ORAL
  Filled 2022-01-15: qty 1

## 2022-01-15 MED ORDER — IOHEXOL 350 MG/ML SOLN
70.0000 mL | Freq: Once | INTRAVENOUS | Status: AC | PRN
  Administered 2022-01-15: 16:00:00 70 mL via INTRAVENOUS

## 2022-01-15 NOTE — H&P (Addendum)
History and Physical    Cheyenne REINERTSEN VQQ:[id number] DOB: [birth year] DOA: 01/15/2022  PCP: Mack Hook, MD  Patient coming from: Home  I have personally briefly reviewed patient's old medical records in [place]  Chief Complaint: hematemesis  HPI: Cheyenne Fowler is a 56 y.o. female with medical history significant for bipolar disorder, T2DM, HTN, HLD, chronic low back pain, substance use disorder (alcohol, cocaine, tobacco) who presented to the ED for evaluation of bloody emesis.  Patient states she was in her usual state of health until the evening of 01/14/2022 when she developed right flank pain which she said was different than her usual chronic low back pain.  She went to sleep and woke up around 530-6 AM 1/24 feeling as if she had to vomit.  She states she did throw up to the bathroom, initially emesis had a mucus appearance.  She had continued emesis which was subsequently consisting of bright red blood.  She came to the ED and while in the triage room she had several more episodes of bloody red emesis.  She said she did feel lightheaded and broke out in sweats during this time.  She has not had any abdominal pain or any bowel movements since onset of her symptoms.  She denies any similar episodes in the past.  She denies any other obvious bleeding.  She states that she does drink alcohol, at least a 40 ounce beer and sometimes an extra 12 ounce beer 4-5 days/week.  Last drink was evening of 1/23.  She denies any history of withdrawal.  She is not taking any aspirin.  She states that she usually takes Tylenol for pain but will take Aleve occasionally when she does not have Tylenol.  She says she last took Aleve about 3 days ago.  ED Course:  Initial vitals showed BP 129/90, pulse 104, RR 26, temp 98.3 F, SPO2 93% on room air.  Labs show WBC 10.8, hemoglobin 14.2, platelets 261,000, sodium 141, potassium 3.8, bicarb 23, BUN 7, creatinine 0.94, serum glucose 150, lipase  33, troponin 5, AST 59, ALT 68, alk phos 73, total bilirubin 0.4, serum ethanol <10, FOBT negative.  Urinalysis negative for UTI.  SARS-CoV-2 and influenza PCR negative.  Repeat hemoglobin 14.4 approximately 7.5 hrs after initial labs.  CTA chest/abdomen/pelvis was negative for findings of aortic aneurysm or dissection or acute vascular or nonvascular findings.  Of note, patient did have extravasation of contrast at right forearm.  She was noted to have moderate swelling without erythema, discoloration, blisters.  ROM in fingers and radial pulse are intact.  Patient was given oral Zofran x2, IV Protonix 40 mg once.  EDP discussed with on-call GI, Dr. Rush Landmark who recommended continue IV Protonix 40 mg twice daily and medical admission, GI will consult in a.m.  The hospitalist service was consulted to admit for further evaluation and management.  Review of Systems: All systems reviewed and are negative except as documented in history of present illness above.   Past Medical History:  Diagnosis Date   Anemia    Bipolar disorder ([place])    Environmental and seasonal allergies 1968   Heart murmur    Prediabetes     Past Surgical History:  Procedure Laterality Date   [name]     Umbilical hernia in the 2nd grade   TUBAL LIGATION  1990    Social History:  reports that she has been smoking cigarettes. She has  a 14.50 pack-year smoking history. She has never used smokeless tobacco. She reports current alcohol use. She reports current drug use. Drugs: Marijuana and "Crack" cocaine.  Allergies  Allergen Reactions   Penicillins     REACTION: hives   Sulfonamide Derivatives     REACTION: hives    Family History  Problem Relation Age of Onset   Diabetes Mother    Cancer Mother        liver--not clear if this was the primary   COPD Mother    Cancer Maternal Grandfather        throat   Schizophrenia Brother    Bipolar disorder Brother     Cancer Brother        Not sure of primary   Breast cancer Maternal Aunt        Cause of death   Cancer Maternal Aunt        pancreatic   Bipolar disorder Sister    Diabetes Sister      Prior to Admission medications   Medication Sig Start Date End Date Taking? Authorizing Provider  atorvastatin (LIPITOR) 20 MG tablet 1 tab by mouth daily with evening meal 12/03/21  Yes Julieanne Manson, MD  B Complex Vitamins (VITAMIN B COMPLEX) TABS 1 tab by mouth 3 times daily Patient taking differently: 1 tab by mouth 2 times daily 11/22/21  Yes Julieanne Manson, MD  cetirizine (ZYRTEC) 10 MG tablet Take 10 mg by mouth daily.   Yes [provider]  diazepam (VALIUM) 5 MG tablet Take one tablet by mouth with food one hour prior to procedure. May repeat 30 minutes prior if needed. 11/26/21  Yes Ellin Goodie E, NP  gabapentin (NEURONTIN) 100 MG capsule Take 1 capsule (100 mg total) by mouth at bedtime. 11/22/21  Yes Julieanne Manson, MD  hydrOXYzine (VISTARIL) 50 MG capsule Take 1 capsule (50 mg total) by mouth 3 (three) times daily as needed. Patient taking differently: Take 50 mg by mouth 3 (three) times daily as needed for anxiety. 11/12/21  Yes Toy Cookey E, NP  lamoTRIgine (LAMICTAL) 100 MG tablet Take 1 tablet (100 mg total) by mouth daily. 11/12/21  Yes Toy Cookey E, NP  losartan (COZAAR) 50 MG tablet Take 1 tablet (50 mg total) by mouth daily. 11/22/21  Yes Julieanne Manson, MD  metFORMIN (GLUCOPHAGE-XR) 500 MG 24 hr tablet 1 tab by mouth twice daily with meals 12/03/21  Yes Julieanne Manson, MD  QUEtiapine (SEROQUEL) 200 MG tablet Take 1 tablet (200 mg total) by mouth at bedtime. 11/12/21  Yes Toy Cookey E, NP  predniSONE (DELTASONE) 10 MG tablet Take 2 tablets (20 mg total) by mouth daily with breakfast. Patient not taking: Reported on 01/15/2022 10/29/21   Nadara Mustard, MD    Physical Exam: Vitals:   01/15/22 1528 01/15/22 1645 01/15/22 1815  01/15/22 1830  BP: 137/78 (!) 140/92 (!) 149/91 (!) 145/93  Pulse: 79 82 84 85  Resp: 20 15 20  (!) 22  Temp:      TempSrc:      SpO2: 98% 100% 98% 96%   Constitutional: Resting in bed, NAD, calm, comfortable Eyes: PERRL, lids and conjunctivae normal ENMT: Mucous membranes are moist. Posterior pharynx clear of any exudate or lesions.Normal dentition.  Neck: normal, supple, no masses. Respiratory: clear to auscultation bilaterally, no wheezing, no crackles. Normal respiratory effort. No accessory muscle use.  Cardiovascular: Regular rate and rhythm, no murmurs / rubs / gallops. No extremity edema. 2+ radial and pedal pulses.  Abdomen: Mild right lower tenderness, no masses palpated. No hepatosplenomegaly. Bowel sounds positive.  Musculoskeletal: Area of swelling right forearm at site of IV contrast extravasation.  Area is tender to palpation.  ROM is intact, radial pulse and hand strength intact.  No clubbing / cyanosis. No joint deformity upper and lower extremities. No contractures. Skin: no rashes, lesions, ulcers. No induration Neurologic: CN 2-12 grossly intact. Sensation intact. Strength 5/5 in all 4.  Psychiatric: Normal judgment and insight. Alert and oriented x 3. Normal mood.   Labs on Admission: I have personally reviewed following labs and imaging studies  CBC: Recent Labs  Lab 01/15/22 0811 01/15/22 1550  WBC 10.8* 10.1  NEUTROABS 5.4 6.3  HGB 14.2 14.4  HCT 44.8 45.9  MCV 91.4 91.4  PLT 261 425   Basic Metabolic Panel: Recent Labs  Lab 01/15/22 0811  NA 141  K 3.8  CL 108  CO2 23  GLUCOSE 150*  BUN 7  CREATININE 0.94  CALCIUM 9.0   GFR: CrCl cannot be calculated (Unknown ideal weight.). Liver Function Tests: Recent Labs  Lab 01/15/22 1550  AST 59*  ALT 68*  ALKPHOS 73  BILITOT 0.4  PROT 6.9  ALBUMIN 3.8   Recent Labs  Lab 01/15/22 1550  LIPASE 33   No results for input(s): AMMONIA in the last 168 hours. Coagulation Profile: Recent Labs   Lab 01/15/22 1823  INR 1.0   Cardiac Enzymes: No results for input(s): CKTOTAL, CKMB, CKMBINDEX, TROPONINI in the last 168 hours. BNP (last 3 results) No results for input(s): PROBNP in the last 8760 hours. HbA1C: No results for input(s): HGBA1C in the last 72 hours. CBG: No results for input(s): GLUCAP in the last 168 hours. Lipid Profile: No results for input(s): CHOL, HDL, LDLCALC, TRIG, CHOLHDL, LDLDIRECT in the last 72 hours. Thyroid Function Tests: No results for input(s): TSH, T4TOTAL, FREET4, T3FREE, THYROIDAB in the last 72 hours. Anemia Panel: No results for input(s): VITAMINB12, FOLATE, FERRITIN, TIBC, IRON, RETICCTPCT in the last 72 hours. Urine analysis:    Component Value Date/Time   COLORURINE YELLOW 01/15/2022 1756   APPEARANCEUR HAZY (A) 01/15/2022 1756   LABSPEC 1.018 01/15/2022 1756   PHURINE 5.0 01/15/2022 1756   GLUCOSEU NEGATIVE 01/15/2022 1756   HGBUR NEGATIVE 01/15/2022 1756   BILIRUBINUR NEGATIVE 01/15/2022 1756   KETONESUR NEGATIVE 01/15/2022 1756   PROTEINUR NEGATIVE 01/15/2022 1756   UROBILINOGEN 1.0 07/12/2012 2150   NITRITE NEGATIVE 01/15/2022 1756   LEUKOCYTESUR NEGATIVE 01/15/2022 1756    Radiological Exams on Admission: CT Angio Chest/Abd/Pel for Dissection W and/or Wo Contrast  Result Date: 01/15/2022 CLINICAL DATA:  Aortic aneurysm, known or suspected Sudden onset back pain woke her up at 3 AM with multiple episodes of hematemesis. History of frequent alcohol use EXAM: CT ANGIOGRAPHY CHEST, ABDOMEN AND PELVIS TECHNIQUE: Non-contrast CT of the chest was initially obtained. Multidetector CT imaging through the chest, abdomen and pelvis was performed using the standard protocol during bolus administration of intravenous contrast. Multiplanar reconstructed images and MIPs were obtained and reviewed to evaluate the vascular anatomy. RADIATION DOSE REDUCTION: This exam was performed according to the departmental dose-optimization program which  includes automated exposure control, adjustment of the mA and/or kV according to patient size and/or use of iterative reconstruction technique. CONTRAST:  36mL OMNIPAQUE IOHEXOL 350 MG/ML SOLN COMPARISON:  Multiple priors FINDINGS: CTA CHEST Cardiovascular: Preferential opacification of the thoracic aorta. No evidence of thoracic aortic aneurysm or dissection. The supra-aortic branch vessels are patent proximally. Normal  heart size. No pericardial effusion. Mediastinum/Nodes: No enlarged mediastinal, hilar, or axillary lymph nodes. The thyroid gland appears normal. Lungs/Pleura: No pleural effusion. No pneumothorax. Subsegmental atelectasis in the dependent regions of the lungs. No lobar consolidation. Motion artifacts obscures fine detail of the pulmonary parenchyma. CTA ABDOMEN AND PELVIS VASCULAR Aorta: Normal caliber aorta without aneurysm, dissection, vasculitis or significant stenosis. Celiac: Patent without evidence of aneurysm, dissection, vasculitis or significant stenosis. SMA: Patent without evidence of aneurysm, dissection, vasculitis or significant stenosis. IMA: Patent. Renals: Both renal arteries are patent without evidence of aneurysm, dissection, vasculitis, fibromuscular dysplasia or significant stenosis. Single renal arteries bilaterally. Inflow: Patent without evidence of aneurysm, dissection, vasculitis or significant stenosis. Veins: No obvious venous abnormality within the limitations of this arterial phase study. NON-VASCULAR Hepatobiliary: The liver is normal in size without focal abnormality. No intrahepatic or extrahepatic biliary ductal dilation. The gallbladder appears normal. Spleen: Normal in size without focal abnormality. Pancreas: No pancreatic ductal dilatation or surrounding inflammatory changes. Adrenals/Urinary Tract: Adrenal glands are unremarkable. Kidneys are normal, without renal calculi, focal lesion, or hydronephrosis. Bladder is unremarkable. Stomach/Bowel: The stomach,  small bowel and large bowel are normal in caliber without abnormal wall thickening or surrounding inflammatory changes. The appendix is normal. Reproductive: Uterus and bilateral adnexa are unremarkable. Lymphatic: No enlarged lymph nodes in the abdomen or pelvis. Other: No abdominopelvic ascites. Musculoskeletal: No aggressive osseous lesions. The soft tissues are unremarkable. Review of the MIP images confirms the above findings. IMPRESSION: Vascular: 1. No findings of aortic aneurysm or dissection. No acute vascular findings in the chest, abdomen or pelvis. Nonvascular: 1. No acute findings in the chest, abdomen or pelvis. Electronically Signed   By: Albin Felling M.D.   On: 01/15/2022 16:33    EKG: Personally reviewed. Normal sinus rhythm without acute ischemic changes.  Assessment/Plan Principal Problem:   Hematemesis/vomiting blood Active Problems:   Alcohol use   Type 2 diabetes mellitus (HCC)   Bipolar I disorder, most recent episode depressed ([place])   Hypertension associated with diabetes ([place])   Hyperlipidemia associated with type 2 diabetes mellitus ([place])   Extravasation of intravenous contrast medium   Cheyenne Fowler is a 56 y.o. female with medical history significant for bipolar disorder, T2DM, HTN, HLD, chronic low back pain, substance use disorder (alcohol, cocaine, tobacco) who is admitted for evaluation of hematemesis.  * Hematemesis/vomiting blood- (present on admission) New onset hematemesis with stable hemoglobin.  Could be alcohol associated gastritis/PUD or Mallory-Weiss tearing. -GI to see in a.m. -Continue IV Protonix 40 mg twice daily -Clear liquid diet now, n.p.o. after midnight -Repeat CBC in a.m.  Alcohol use- (present on admission) Reports drinking 40-52 ounce beer 4-5x/week.  No sign of withdrawal on admission.  Place on CIWA checks.  Type 2 diabetes mellitus (HCC) Hold metformin, placed on sensitive SSI.  Last A1c 6.6 11/22/2021.  Bipolar I disorder, most  recent episode depressed ([place])- (present on admission) Mood currently stable.  Continue Lamictal, Seroquel, and hydroxyzine as needed.  Hypertension associated with diabetes ([place])- (present on admission) Continue losartan 50 mg daily.  Hyperlipidemia associated with type 2 diabetes mellitus ([place])- (present on admission) Continue atorvastatin 20 mg.  Extravasation of intravenous contrast medium- (present on admission) IV contrast extravasated into right upper extremity at time of CTA.  Radial pulses, finger strength intact.  Skin is warm and without erythema or cyanosis.  Continue to monitor closely, ice to area as instructed.   DVT prophylaxis: SCDs Code Status: Full code, confirmed with patient  on admission Family Communication: Discussed with patient, she has discussed with family Disposition Plan: From home and likely discharge to home pending clinical progress Consults called: GI Level of care: Med-Surg Admission status:   Status is: Observation  The patient remains OBS appropriate and will d/c before 2 midnights.   Zada Finders MD [facility]  If 7PM-7AM, please contact night-coverage [email or web address]  01/15/2022, 7:24 PM

## 2022-01-15 NOTE — Telephone Encounter (Signed)
Patient called to inform Dr Amil Amen that she is currently in ED. 01/15/2022

## 2022-01-15 NOTE — ED Triage Notes (Signed)
Pt. Stated, Ive had back, flank pain and N/V started this morning.

## 2022-01-15 NOTE — Assessment & Plan Note (Signed)
New onset hematemesis with stable hemoglobin.  Could be alcohol associated gastritis/PUD or Mallory-Weiss tearing. -GI to see in a.m. -Continue IV Protonix 40 mg twice daily -Clear liquid diet now, n.p.o. after midnight -Repeat CBC in a.m.

## 2022-01-15 NOTE — Assessment & Plan Note (Signed)
IV contrast extravasated into right upper extremity at time of CTA.  Radial pulses, finger strength intact.  Skin is warm and without erythema or cyanosis.  Continue to monitor closely, ice to area as instructed.

## 2022-01-15 NOTE — ED Provider Notes (Signed)
[facility] EMERGENCY DEPARTMENT Provider Note   CSN: [id number] Arrival date & time: 01/15/22  6834     History  Chief Complaint  Patient presents with   Back Pain   Flank Pain   Nausea   Emesis    Cheyenne Fowler is a 56 y.o. female with a past medical history of bipolar, cocaine use, alcohol use disorder, who presents today for evaluation of sudden onset of back/flank pain that started at 3 or 4 AM with associated nausea and vomiting blood.  She states since her symptoms started she has vomited bright red blood 5 times.  She denies eating anything red or with red or similar food coloring.  She states that her throat hurts from vomiting.  She denies any fevers.  She denies dysuria, frequency or urgency.  No chest specific pain but the pain is in the upper lower back and abdomen.  HPI     Home Medications Prior to Admission medications   Medication Sig Start Date End Date Taking? Authorizing Provider  atorvastatin (LIPITOR) 20 MG tablet 1 tab by mouth daily with evening meal 12/03/21  Yes Mack Hook, MD  B Complex Vitamins (VITAMIN B COMPLEX) TABS 1 tab by mouth 3 times daily Patient taking differently: 1 tab by mouth 2 times daily 11/22/21  Yes Mack Hook, MD  cetirizine (ZYRTEC) 10 MG tablet Take 10 mg by mouth daily.   Yes [provider]  diazepam (VALIUM) 5 MG tablet Take one tablet by mouth with food one hour prior to procedure. May repeat 30 minutes prior if needed. 11/26/21  Yes Barnet Pall E, NP  gabapentin (NEURONTIN) 100 MG capsule Take 1 capsule (100 mg total) by mouth at bedtime. 11/22/21  Yes Mack Hook, MD  hydrOXYzine (VISTARIL) 50 MG capsule Take 1 capsule (50 mg total) by mouth 3 (three) times daily as needed. Patient taking differently: Take 50 mg by mouth 3 (three) times daily as needed for anxiety. 11/12/21  Yes Eulis Canner E, NP  lamoTRIgine (LAMICTAL) 100 MG tablet Take 1 tablet (100 mg total) by  mouth daily. 11/12/21  Yes Eulis Canner E, NP  losartan (COZAAR) 50 MG tablet Take 1 tablet (50 mg total) by mouth daily. 11/22/21  Yes Mack Hook, MD  metFORMIN (GLUCOPHAGE-XR) 500 MG 24 hr tablet 1 tab by mouth twice daily with meals 12/03/21  Yes Mack Hook, MD  QUEtiapine (SEROQUEL) 200 MG tablet Take 1 tablet (200 mg total) by mouth at bedtime. 11/12/21  Yes Eulis Canner E, NP  predniSONE (DELTASONE) 10 MG tablet Take 2 tablets (20 mg total) by mouth daily with breakfast. Patient not taking: Reported on 01/15/2022 10/29/21   Newt Minion, MD      Allergies    Penicillins and Sulfonamide derivatives    Review of Systems   Review of Systems See HPI  Physical Exam Updated Vital Signs BP (!) 140/92    Pulse 82    Temp 98.3 F (36.8 C) (Oral)    Resp 15    LMP 06/09/2018 (Exact Date) Comment: Bleeding one month ago.   SpO2 100%  Physical Exam Vitals and nursing note reviewed.  Constitutional:      Comments: Appears to be in pain intermittently.   HENT:     Head: Normocephalic and atraumatic.  Eyes:     Conjunctiva/sclera: Conjunctivae normal.  Cardiovascular:     Rate and Rhythm: Normal rate and regular rhythm.     Pulses: Normal pulses.  Heart sounds: Normal heart sounds.  Pulmonary:     Effort: Pulmonary effort is normal. No respiratory distress.  Abdominal:     General: There is no distension.     Tenderness: There is abdominal tenderness. There is no right CVA tenderness or left CVA tenderness.  Musculoskeletal:     Cervical back: Normal range of motion and neck supple.     Right lower leg: No edema.     Left lower leg: No edema.     Comments: No obvious acute injury  Skin:    General: Skin is warm.  Neurological:     Mental Status: She is alert.     Comments: Awake and alert, answers all questions appropriately.  Speech is not slurred.  Psychiatric:        Mood and Affect: Mood normal.        Behavior: Behavior normal.    ED  Results / Procedures / Treatments   Labs (all labs ordered are listed, but only abnormal results are displayed) Labs Reviewed  URINALYSIS, ROUTINE W REFLEX MICROSCOPIC - Abnormal; Notable for the following components:      Result Value   APPearance HAZY (*)    All other components within normal limits  CBC WITH DIFFERENTIAL/PLATELET - Abnormal; Notable for the following components:   WBC 10.8 (*)    Lymphs Abs 4.3 (*)    All other components within normal limits  BASIC METABOLIC PANEL - Abnormal; Notable for the following components:   Glucose, Bld 150 (*)    All other components within normal limits  HEPATIC FUNCTION PANEL - Abnormal; Notable for the following components:   AST 59 (*)    ALT 68 (*)    All other components within normal limits  CBC WITH DIFFERENTIAL/PLATELET - Abnormal; Notable for the following components:   Abs Immature Granulocytes 0.08 (*)    All other components within normal limits  RESP PANEL BY RT-PCR (FLU A&B, COVID) ARPGX2  LIPASE, BLOOD  ETHANOL  PROTIME-INR  RAPID URINE DRUG SCREEN, HOSP PERFORMED  POC OCCULT BLOOD, ED  SAMPLE TO BLOOD BANK  TROPONIN I (HIGH SENSITIVITY)  TROPONIN I (HIGH SENSITIVITY)    EKG None  Radiology CT Angio Chest/Abd/Pel for Dissection W and/or Wo Contrast  Result Date: 01/15/2022 CLINICAL DATA:  Aortic aneurysm, known or suspected Sudden onset back pain woke her up at 3 AM with multiple episodes of hematemesis. History of frequent alcohol use EXAM: CT ANGIOGRAPHY CHEST, ABDOMEN AND PELVIS TECHNIQUE: Non-contrast CT of the chest was initially obtained. Multidetector CT imaging through the chest, abdomen and pelvis was performed using the standard protocol during bolus administration of intravenous contrast. Multiplanar reconstructed images and MIPs were obtained and reviewed to evaluate the vascular anatomy. RADIATION DOSE REDUCTION: This exam was performed according to the departmental dose-optimization program which  includes automated exposure control, adjustment of the mA and/or kV according to patient size and/or use of iterative reconstruction technique. CONTRAST:  7mL OMNIPAQUE IOHEXOL 350 MG/ML SOLN COMPARISON:  Multiple priors FINDINGS: CTA CHEST Cardiovascular: Preferential opacification of the thoracic aorta. No evidence of thoracic aortic aneurysm or dissection. The supra-aortic branch vessels are patent proximally. Normal heart size. No pericardial effusion. Mediastinum/Nodes: No enlarged mediastinal, hilar, or axillary lymph nodes. The thyroid gland appears normal. Lungs/Pleura: No pleural effusion. No pneumothorax. Subsegmental atelectasis in the dependent regions of the lungs. No lobar consolidation. Motion artifacts obscures fine detail of the pulmonary parenchyma. CTA ABDOMEN AND PELVIS VASCULAR Aorta: Normal caliber aorta without aneurysm,  dissection, vasculitis or significant stenosis. Celiac: Patent without evidence of aneurysm, dissection, vasculitis or significant stenosis. SMA: Patent without evidence of aneurysm, dissection, vasculitis or significant stenosis. IMA: Patent. Renals: Both renal arteries are patent without evidence of aneurysm, dissection, vasculitis, fibromuscular dysplasia or significant stenosis. Single renal arteries bilaterally. Inflow: Patent without evidence of aneurysm, dissection, vasculitis or significant stenosis. Veins: No obvious venous abnormality within the limitations of this arterial phase study. NON-VASCULAR Hepatobiliary: The liver is normal in size without focal abnormality. No intrahepatic or extrahepatic biliary ductal dilation. The gallbladder appears normal. Spleen: Normal in size without focal abnormality. Pancreas: No pancreatic ductal dilatation or surrounding inflammatory changes. Adrenals/Urinary Tract: Adrenal glands are unremarkable. Kidneys are normal, without renal calculi, focal lesion, or hydronephrosis. Bladder is unremarkable. Stomach/Bowel: The stomach,  small bowel and large bowel are normal in caliber without abnormal wall thickening or surrounding inflammatory changes. The appendix is normal. Reproductive: Uterus and bilateral adnexa are unremarkable. Lymphatic: No enlarged lymph nodes in the abdomen or pelvis. Other: No abdominopelvic ascites. Musculoskeletal: No aggressive osseous lesions. The soft tissues are unremarkable. Review of the MIP images confirms the above findings. IMPRESSION: Vascular: 1. No findings of aortic aneurysm or dissection. No acute vascular findings in the chest, abdomen or pelvis. Nonvascular: 1. No acute findings in the chest, abdomen or pelvis. Electronically Signed   By: Albin Felling M.D.   On: 01/15/2022 16:33    Procedures Procedures    Medications Ordered in ED Medications  pantoprazole (PROTONIX) injection 40 mg (has no administration in time range)  HYDROcodone-acetaminophen (NORCO/VICODIN) 5-325 MG per tablet 1 tablet (1 tablet Oral Given 01/15/22 0802)  ondansetron (ZOFRAN-ODT) disintegrating tablet 4 mg (4 mg Oral Given 01/15/22 0803)  ondansetron (ZOFRAN-ODT) disintegrating tablet 4 mg (4 mg Oral Given 01/15/22 1434)  pantoprazole (PROTONIX) injection 40 mg (40 mg Intravenous Given 01/15/22 1649)  iohexol (OMNIPAQUE) 350 MG/ML injection 70 mL (70 mLs Intravenous Contrast Given 01/15/22 1620)    ED Course/ Medical Decision Making/ A&P Clinical Course as of 01/15/22 1858  Tue Jan 15, 2022  1514 CT Angio Chest/Abd/Pel for Dissection W and/or Wo Contrast Per RN patients IV infiltrated during CTA at the end.  Extremity is elevated. And on ice.  [EH]  0093 Chaperone/patient's primary RN was present for rectal.  No stool in the rectal vault.  Since being placed in the room she has not had repeat episodes of hematemesis. [EH]  62 I spoke with on-call GI Dr. Rush Landmark who recommends medicine admission, 40 of Protonix twice daily, clears if tolerated and n.p.o. at midnight for consideration of scope. [EH]  48  I spoke with hospitalist who will see patient for admission.  [EH]    Clinical Course User Index [EH] Lorin Glass, PA-C                           Medical Decision Making Patient is a 56 year old woman, not anticoagulated, who presents today for evaluation of hematemesis. She reports that the pain in her abdomen/back woke her up this morning and has had about 4-5 total episodes of hematemesis since.  She states she has never had a colonoscopy or an endoscopy, has not seen a GI doctor. She does report frequent alcohol consumption, stating she drinks about 48 ounces of beer 5 days out of the week.  She states she does not know what esophageal varices are and has never been told she has them.   Amount and/or  Complexity of Data Reviewed Independent Historian:     Details: None available for review External Data Reviewed:     Details: None available for review Labs: ordered.    Details: Urine is hazy however is otherwise unremarkable.  Hepatic function panel shows slight elevation in AST and ALT at 59 and 68 respectively.  Lipase is not elevated.  Troponin is normal.  CBC x2 without significant drop in hemoglobin or anemia.  Ethanol is undetected.  Flu and COVID testing is negative.  BMP with hyperglycemia however otherwise unremarkable. Radiology: ordered. Decision-making details documented in ED Course.    Details: Given patient's new hematemesis with reported severe pain in the lower chest/upper abdomen CT dissection study is ordered.  This does not show evidence of PE, dissection, or other acute abnormalities. ECG/medicine tests: ordered. Discussion of management or test interpretation with external provider(s): I spoke with GI Dr. Rush Landmark, he requested patient be n.p.o. at midnight, allowed clears until then if tolerating, consideration for scope tomorrow versus day after based on availability and patient condition.  Risk Prescription drug management. Decision regarding  hospitalization.  Critical Care Total time providing critical care: < 30 minutes  Patient was started on IV Protonix while in the emergency room. She is today high risk of esophageal varices based on her history of alcohol use.  Her BNP is not significantly elevated, and her Hemoccult was negative however there was minimal stool in the rectal canal and it was a poor sample. Both of these are against any significant bleed, however with patient's report of 4-5 times of vomiting large-volume bright red blood she will require admission.  I spoke with hospitalist who will see patient for admission.  The patient appears reasonably stabilized for admission considering the current resources, flow, and capabilities available in the ED at this time, and I doubt any other [facility] requiring further screening and/or treatment in the ED prior to admission assuming timely admission and bed placement.  Note: Portions of this report may have been transcribed using voice recognition software. Every effort was made to ensure accuracy; however, inadvertent computerized transcription errors may be present Final Clinical Impression(s) / ED Diagnoses Final diagnoses:  Hematemesis with nausea  Alcohol use    Rx / DC Orders ED Discharge Orders     None         Ollen Gross 01/15/22 Reva Bores, MD 01/15/22 2004

## 2022-01-15 NOTE — Assessment & Plan Note (Signed)
Mood currently stable.  Continue Lamictal, Seroquel, and hydroxyzine as needed.

## 2022-01-15 NOTE — Assessment & Plan Note (Signed)
Continue losartan 50 mg daily.  

## 2022-01-15 NOTE — Assessment & Plan Note (Signed)
Continue atorvastatin 20 mg

## 2022-01-15 NOTE — Assessment & Plan Note (Signed)
Reports drinking 40-52 ounce beer 4-5x/week.  No sign of withdrawal on admission.  Place on CIWA checks.

## 2022-01-15 NOTE — Assessment & Plan Note (Signed)
Hold metformin, placed on sensitive SSI.  Last A1c 6.6 11/22/2021.

## 2022-01-15 NOTE — ED Notes (Signed)
Called x1 for triage and unable to locate in lobby. Will try again

## 2022-01-15 NOTE — Progress Notes (Signed)
°  Evaluation after Contrast Extravasation  Patient seen and examined immediately after contrast extravasation while in CT.  Exam: There is moderate swelling below the left ac area.  There is no erythema. There is no discoloration. There are no blisters. There are no signs of decreased perfusion of the skin.  It is  warm to touch.  The patient has full ROM in fingers.  Radial pulse  normal.  Per contrast extravasation protocol, I have instructed the patient to keep an ice pack on the area for 20-60 minutes at a time for about 48 hours.   Keep arm elevated as much as possible.   The patient understands to call the radiology department if there is: - increase in pain or swelling - changed or altered sensation - ulceration or blistering - increasing redness - warmth or increasing firmness - decreased tissue perfusion as noted by decreased capillary refill or discoloration of skin - decreased pulses peripheral to site   Jacqualine Mau PA-C 01/15/2022 4:19 PM

## 2022-01-16 ENCOUNTER — Encounter (HOSPITAL_COMMUNITY): Payer: Self-pay | Admitting: Internal Medicine

## 2022-01-16 ENCOUNTER — Other Ambulatory Visit: Payer: Self-pay

## 2022-01-16 DIAGNOSIS — Z6839 Body mass index (BMI) 39.0-39.9, adult: Secondary | ICD-10-CM | POA: Diagnosis not present

## 2022-01-16 DIAGNOSIS — K209 Esophagitis, unspecified without bleeding: Secondary | ICD-10-CM | POA: Diagnosis not present

## 2022-01-16 DIAGNOSIS — F17210 Nicotine dependence, cigarettes, uncomplicated: Secondary | ICD-10-CM | POA: Diagnosis present

## 2022-01-16 DIAGNOSIS — K2100 Gastro-esophageal reflux disease with esophagitis, without bleeding: Secondary | ICD-10-CM | POA: Diagnosis present

## 2022-01-16 DIAGNOSIS — F3181 Bipolar II disorder: Secondary | ICD-10-CM | POA: Diagnosis present

## 2022-01-16 DIAGNOSIS — E669 Obesity, unspecified: Secondary | ICD-10-CM | POA: Diagnosis present

## 2022-01-16 DIAGNOSIS — M47816 Spondylosis without myelopathy or radiculopathy, lumbar region: Secondary | ICD-10-CM | POA: Diagnosis present

## 2022-01-16 DIAGNOSIS — Z818 Family history of other mental and behavioral disorders: Secondary | ICD-10-CM | POA: Diagnosis not present

## 2022-01-16 DIAGNOSIS — K2990 Gastroduodenitis, unspecified, without bleeding: Secondary | ICD-10-CM | POA: Diagnosis present

## 2022-01-16 DIAGNOSIS — K920 Hematemesis: Secondary | ICD-10-CM | POA: Diagnosis present

## 2022-01-16 DIAGNOSIS — Z7984 Long term (current) use of oral hypoglycemic drugs: Secondary | ICD-10-CM | POA: Diagnosis not present

## 2022-01-16 DIAGNOSIS — Z833 Family history of diabetes mellitus: Secondary | ICD-10-CM | POA: Diagnosis not present

## 2022-01-16 DIAGNOSIS — G8929 Other chronic pain: Secondary | ICD-10-CM | POA: Diagnosis present

## 2022-01-16 DIAGNOSIS — I152 Hypertension secondary to endocrine disorders: Secondary | ICD-10-CM | POA: Diagnosis present

## 2022-01-16 DIAGNOSIS — K2921 Alcoholic gastritis with bleeding: Secondary | ICD-10-CM | POA: Diagnosis present

## 2022-01-16 DIAGNOSIS — E785 Hyperlipidemia, unspecified: Secondary | ICD-10-CM | POA: Diagnosis present

## 2022-01-16 DIAGNOSIS — F1410 Cocaine abuse, uncomplicated: Secondary | ICD-10-CM | POA: Diagnosis present

## 2022-01-16 DIAGNOSIS — E1169 Type 2 diabetes mellitus with other specified complication: Secondary | ICD-10-CM | POA: Diagnosis present

## 2022-01-16 DIAGNOSIS — Z79899 Other long term (current) drug therapy: Secondary | ICD-10-CM | POA: Diagnosis not present

## 2022-01-16 DIAGNOSIS — Z800 Family history of malignant neoplasm of digestive organs: Secondary | ICD-10-CM | POA: Diagnosis not present

## 2022-01-16 DIAGNOSIS — F3130 Bipolar disorder, current episode depressed, mild or moderate severity, unspecified: Secondary | ICD-10-CM | POA: Diagnosis not present

## 2022-01-16 DIAGNOSIS — F1010 Alcohol abuse, uncomplicated: Secondary | ICD-10-CM | POA: Diagnosis present

## 2022-01-16 DIAGNOSIS — Z20822 Contact with and (suspected) exposure to covid-19: Secondary | ICD-10-CM | POA: Diagnosis present

## 2022-01-16 DIAGNOSIS — Z803 Family history of malignant neoplasm of breast: Secondary | ICD-10-CM | POA: Diagnosis not present

## 2022-01-16 DIAGNOSIS — K760 Fatty (change of) liver, not elsewhere classified: Secondary | ICD-10-CM | POA: Diagnosis present

## 2022-01-16 DIAGNOSIS — K2970 Gastritis, unspecified, without bleeding: Secondary | ICD-10-CM | POA: Diagnosis present

## 2022-01-16 LAB — COMPREHENSIVE METABOLIC PANEL
ALT: 67 U/L — ABNORMAL HIGH (ref 0–44)
AST: 57 U/L — ABNORMAL HIGH (ref 15–41)
Albumin: 3.4 g/dL — ABNORMAL LOW (ref 3.5–5.0)
Alkaline Phosphatase: 76 U/L (ref 38–126)
Anion gap: 10 (ref 5–15)
BUN: 6 mg/dL (ref 6–20)
CO2: 24 mmol/L (ref 22–32)
Calcium: 9.1 mg/dL (ref 8.9–10.3)
Chloride: 104 mmol/L (ref 98–111)
Creatinine, Ser: 0.9 mg/dL (ref 0.44–1.00)
GFR, Estimated: >60 mL/min (ref >60)
Glucose, Bld: 114 mg/dL — ABNORMAL HIGH (ref 70–99)
Potassium: 3.5 mmol/L (ref 3.5–5.1)
Sodium: 138 mmol/L (ref 135–145)
Total Bilirubin: 0.4 mg/dL (ref 0.3–1.2)
Total Protein: 6.5 g/dL (ref 6.5–8.1)

## 2022-01-16 LAB — CBG MONITORING, ED
Glucose-Capillary: 134 mg/dL — ABNORMAL HIGH (ref 70–99)
Glucose-Capillary: 143 mg/dL — ABNORMAL HIGH (ref 70–99)

## 2022-01-16 LAB — RAPID URINE DRUG SCREEN, HOSP PERFORMED
Amphetamines: NOT DETECTED
Barbiturates: NOT DETECTED
Benzodiazepines: POSITIVE — AB
Cocaine: POSITIVE — AB
Opiates: POSITIVE — AB
Tetrahydrocannabinol: POSITIVE — AB

## 2022-01-16 LAB — CBC
HCT: 44.8 % (ref 36.0–46.0)
Hemoglobin: 14.5 g/dL (ref 12.0–15.0)
MCH: 29.5 pg (ref 26.0–34.0)
MCHC: 32.4 g/dL (ref 30.0–36.0)
MCV: 91.1 fL (ref 80.0–100.0)
Platelets: 258 10*3/uL (ref 150–400)
RBC: 4.92 MIL/uL (ref 3.87–5.11)
RDW: 12.9 % (ref 11.5–15.5)
WBC: 8.9 10*3/uL (ref 4.0–10.5)
nRBC: 0 % (ref 0.0–0.2)

## 2022-01-16 LAB — PROTIME-INR
INR: 1 (ref 0.8–1.2)
Prothrombin Time: 12.7 seconds (ref 11.4–15.2)

## 2022-01-16 LAB — HIV ANTIBODY (ROUTINE TESTING W REFLEX): HIV Screen 4th Generation wRfx: NONREACTIVE

## 2022-01-16 LAB — GLUCOSE, CAPILLARY
Glucose-Capillary: 94 mg/dL (ref 70–99)
Glucose-Capillary: 108 mg/dL — ABNORMAL HIGH (ref 70–99)

## 2022-01-16 NOTE — H&P (View-Only) (Signed)
[facility] Consult: 9:45 AM 01/16/2022  LOS: 0 days  Wednesday   Referring Provider: Dr Loann Quill  Primary Care Physician:  Mack Hook, MD Primary Gastroenterologist:  Althia Forts     Reason for Consultation:  anemia.  Marland Kitchen     HPI: Cheyenne Fowler is a 56 y.o. female.  PMH DM2 on Glucophage.  Bipolar disorder.  Polysubstance abuse including alcohol, cocaine.  Dyslipidemia.  Mild hepatic steatosis on CT in 2018.  No prior colonoscopy or EGD.  Per usual routine, on Monday evening the patient and her husband split 120 ounces of Miller high life beer between the 2 of them.  He probably drank more than she did.  Normally drinks this amount 4 to 5 days a week.  Does not suffer from significant heartburn symptoms.  Good appetite.  Regular bowel movements.   Acute onset R low back/flank pain on beginning early morning 1/24.  Began vomiting non-bloody then dark emesis.  No BM's since brown stool on Monday night.  Throat pain as a result of repeated vomiting.  No fevers, urinary symptoms.  Since arrival in ED 18 h ago, no recurrent vomiting Initial heart rate 104 but in the 70s to 80s on repeated remeasure.  Some BPs a bit high with diastolics in the 11D but have normalized to 120s/70s.  Excellent room air saturations.  Has not vomited since arriving in the ED and initiating scheduled IV Protonix.  Has not required antiemetics.  This morning she is hungry.  Still having persistent low back and right flank pain and she says this is reminiscent of when she has had "kidney infection "in the past.  Has not seen any bloody stool or had urinary tract symptoms.  She took 1 Aleve 4 days ago but normally does not use this with any frequency.  CT angio chest/abdomen/pelvis: Unremarkable vascular and nonvascular imaging. UA  unremarkable Hb 14.5, MCV 91.  Normal WBCs and platelets. T bili, alk phos normal.  AST/ALT 59/68. BUN/creatinine, electrolytes normal.  Glucose 150.  A1c 6.6. INR normal. FOBT negative  Lives with her husband.  Previously worked as a Programmer, systems.  Smokes half a pack of cigarettes daily. Family history pertinent for liver cancer in her mother.  Maternal aunts with history of pancreatic and breast cancer.  Brother and cousins with peptic ulcer disease.  Past Medical History:  Diagnosis Date   Anemia    Bipolar disorder ([place])    Diabetes mellitus ([place]) 2022   type 2   Environmental and seasonal allergies 1968   Heart murmur     Past Surgical History:  Procedure Laterality Date   [place]     Umbilical hernia in the 2nd grade   TUBAL LIGATION  1990    Prior to Admission medications   Medication Sig Start Date End Date Taking? Authorizing Provider  atorvastatin (LIPITOR) 20 MG tablet 1 tab by mouth daily with evening meal 12/03/21  Yes Mack Hook, MD  B Complex Vitamins (VITAMIN B COMPLEX) TABS  1 tab by mouth 3 times daily Patient taking differently: 1 tab by mouth 2 times daily 11/22/21  Yes Julieanne Manson, MD  cetirizine (ZYRTEC) 10 MG tablet Take 10 mg by mouth daily.   Yes [provider]  diazepam (VALIUM) 5 MG tablet Take one tablet by mouth with food one hour prior to procedure. May repeat 30 minutes prior if needed. 11/26/21  Yes Ellin Goodie E, NP  gabapentin (NEURONTIN) 100 MG capsule Take 1 capsule (100 mg total) by mouth at bedtime. 11/22/21  Yes Julieanne Manson, MD  hydrOXYzine (VISTARIL) 50 MG capsule Take 1 capsule (50 mg total) by mouth 3 (three) times daily as needed. Patient taking differently: Take 50 mg by mouth 3 (three) times daily as needed for anxiety. 11/12/21  Yes Toy Cookey E, NP  lamoTRIgine (LAMICTAL) 100 MG tablet Take 1 tablet (100 mg total) by mouth daily. 11/12/21  Yes  Toy Cookey E, NP  losartan (COZAAR) 50 MG tablet Take 1 tablet (50 mg total) by mouth daily. 11/22/21  Yes Julieanne Manson, MD  metFORMIN (GLUCOPHAGE-XR) 500 MG 24 hr tablet 1 tab by mouth twice daily with meals 12/03/21  Yes Julieanne Manson, MD  QUEtiapine (SEROQUEL) 200 MG tablet Take 1 tablet (200 mg total) by mouth at bedtime. 11/12/21  Yes Toy Cookey E, NP    Scheduled Meds:  atorvastatin  20 mg Oral QPM   bupivacaine  3 mL Other Once   gabapentin  100 mg Oral QHS   insulin aspart  0-9 Units Subcutaneous Q4H   lamoTRIgine  100 mg Oral Daily   losartan  50 mg Oral Daily   pantoprazole (PROTONIX) IV  40 mg Intravenous Q12H   QUEtiapine  200 mg Oral QHS   Infusions:  PRN Meds: acetaminophen **OR** acetaminophen, hydrOXYzine, ondansetron **OR** ondansetron (ZOFRAN) IV, traMADol   Allergies as of 01/15/2022 - Review Complete 01/15/2022  Allergen Reaction Noted   Penicillins  08/17/2007   Sulfonamide derivatives  08/17/2007    Family History  Problem Relation Age of Onset   Diabetes Mother    Cancer Mother        liver--not clear if this was the primary   COPD Mother    Cancer Maternal Grandfather        throat   Schizophrenia Brother    Bipolar disorder Brother    Cancer Brother        Not sure of primary   Breast cancer Maternal Aunt        Cause of death   Cancer Maternal Aunt        pancreatic   Bipolar disorder Sister    Diabetes Sister     Social History   Socioeconomic History   Marital status: Married    Spouse name: Evern Bio   Number of children: 3   Years of education: Not on file   Highest education level: GED or equivalent  Occupational History   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  No MJ for 2 months.   Sexual activity: Yes    Birth control/protection: Surgical  Other Topics Concern   Not on file  Social History Narrative   She and her husband liver with her son and helping out with her granddaughter.    Born [place] raised in [place]    Social Determinants of Health   Financial Resource Strain: Not on file  Food Insecurity: Not on file  Transportation Needs: Not on file  Physical Activity: Not on file  Stress: Not on file  Social Connections: Not on file  Intimate Partner Violence: Not on file    REVIEW OF SYSTEMS: Constitutional: No weakness, no fatigue. ENT:  No nose bleeds Pulm: No shortness of breath or cough. CV:  No palpitations, no LE edema.  Angina. GU:  No hematuria, no frequency, no dysuria.  She takes B12 supplements daily which cause her urine to be bright yellow. GI: See HPI. Heme: No unusual bleeding or bruising. Transfusions: None. Neuro:  No headaches, no peripheral tingling or numbness Derm:  No itching, no rash or sores.  Endocrine:  No sweats or chills.  No polyuria or dysuria Immunization: Reviewed. Travel:  Not queried.   PHYSICAL EXAM: Vital signs in last 24 hours: Vitals:   01/16/22 0135 01/16/22 0745  BP: 122/68 126/77  Pulse: 88 78  Resp: 18 16  Temp: 98.1 F (36.7 C) 98.7 F (37.1 C)  SpO2: 99% 98%   Wt Readings from Last 3 Encounters:  11/26/21 102.1 kg  11/22/21 102.1 kg  08/23/21 98.7 kg    General: Pleasant, cooperative a bit anxious.  Does not look acutely ill.  No distress. Head: No facial asymmetry or swelling.  No signs of head trauma. Eyes: Conjunctiva pink.  No scleral icterus.  EOMI. Ears: No hearing deficit Nose: No congestion or discharge Mouth: Fair dentition.  Tongue midline.  Mucosa moist, pink, clear. Neck: No JVD, no masses, no  thyromegaly Lungs: Diminished breath sounds but clear without labored breathing.  No cough Heart: RRR.  No MRG.  S1, S2 present. Abdomen: Soft without tenderness.  Not distended.  Active bowel sounds.  No masses, HSM, bruits, hernias.   Rectal: DRE not performed Musc/Skeltl: Tenderness with percussion over the lumbar spine.  No flank tenderness.  No joint redness swelling or gross deformity. Extremities: No CCE. Neurologic: Fully alert and oriented.  Able to provide excellent history.  Moves all 4 limbs without tremor or weakness. Skin: No suspicious lesions, rashes, sores. Nodes: No cervical adenopathy Psych: Cooperative.  Fluid speech.  Slightly anxious but not agitated  Intake/Output from previous day: No intake/output data recorded. Intake/Output this shift: No intake/output data recorded.  LAB RESULTS: Recent Labs    01/15/22 0811 01/15/22 1550 01/16/22 0813  WBC 10.8* 10.1 8.9  HGB 14.2 14.4 14.5  HCT 44.8 45.9 44.8  PLT 261 266 258   BMET Lab Results  Component Value Date   NA 138 01/16/2022   NA 141 01/15/2022   NA 145 (H) 11/22/2021   K 3.5 01/16/2022   K 3.8 01/15/2022   K 4.3 11/22/2021   CL 104 01/16/2022   CL 108 01/15/2022   CL 105 11/22/2021   CO2 24 01/16/2022   CO2 23 01/15/2022   CO2 26 11/22/2021   GLUCOSE 114 (H) 01/16/2022   GLUCOSE 150 (H) 01/15/2022   GLUCOSE 85 11/22/2021   BUN 6 01/16/2022   BUN 7 01/15/2022   BUN 15 11/22/2021   CREATININE 0.90 01/16/2022  CREATININE 0.94 01/15/2022   CREATININE 0.86 11/22/2021   CALCIUM 9.1 01/16/2022   CALCIUM 9.0 01/15/2022   CALCIUM 9.0 11/22/2021   LFT Recent Labs    01/15/22 1550 01/16/22 0813  PROT 6.9 6.5  ALBUMIN 3.8 3.4*  AST 59* 57*  ALT 68* 67*  ALKPHOS 73 76  BILITOT 0.4 0.4  BILIDIR <0.1  --   IBILI NOT CALCULATED  --    PT/INR Lab Results  Component Value Date   INR 1.0 01/16/2022   INR 1.0 01/15/2022   Hepatitis Panel No results for input(s): HEPBSAG, HCVAB,  HEPAIGM, HEPBIGM in the last 72 hours. C-Diff No components found for: CDIFF Lipase     Component Value Date/Time   LIPASE 33 01/15/2022 1550    Drugs of Abuse     Component Value Date/Time   LABOPIA NONE DETECTED 05/21/2012 1626   COCAINSCRNUR POSITIVE (A) 05/21/2012 1626   LABBENZ POSITIVE (A) 05/21/2012 1626   AMPHETMU NONE DETECTED 05/21/2012 1626   THCU POSITIVE (A) 05/21/2012 1626   LABBARB NONE DETECTED 05/21/2012 1626     RADIOLOGY STUDIES: CT Angio Chest/Abd/Pel for Dissection W and/or Wo Contrast  Result Date: 01/15/2022 CLINICAL DATA:  Aortic aneurysm, known or suspected Sudden onset back pain woke her up at 3 AM with multiple episodes of hematemesis. History of frequent alcohol use EXAM: CT ANGIOGRAPHY CHEST, ABDOMEN AND PELVIS TECHNIQUE: Non-contrast CT of the chest was initially obtained. Multidetector CT imaging through the chest, abdomen and pelvis was performed using the standard protocol during bolus administration of intravenous contrast. Multiplanar reconstructed images and MIPs were obtained and reviewed to evaluate the vascular anatomy. RADIATION DOSE REDUCTION: This exam was performed according to the departmental dose-optimization program which includes automated exposure control, adjustment of the mA and/or kV according to patient size and/or use of iterative reconstruction technique. CONTRAST:  51mL OMNIPAQUE IOHEXOL 350 MG/ML SOLN COMPARISON:  Multiple priors FINDINGS: CTA CHEST Cardiovascular: Preferential opacification of the thoracic aorta. No evidence of thoracic aortic aneurysm or dissection. The supra-aortic branch vessels are patent proximally. Normal heart size. No pericardial effusion. Mediastinum/Nodes: No enlarged mediastinal, hilar, or axillary lymph nodes. The thyroid gland appears normal. Lungs/Pleura: No pleural effusion. No pneumothorax. Subsegmental atelectasis in the dependent regions of the lungs. No lobar consolidation. Motion artifacts obscures  fine detail of the pulmonary parenchyma. CTA ABDOMEN AND PELVIS VASCULAR Aorta: Normal caliber aorta without aneurysm, dissection, vasculitis or significant stenosis. Celiac: Patent without evidence of aneurysm, dissection, vasculitis or significant stenosis. SMA: Patent without evidence of aneurysm, dissection, vasculitis or significant stenosis. IMA: Patent. Renals: Both renal arteries are patent without evidence of aneurysm, dissection, vasculitis, fibromuscular dysplasia or significant stenosis. Single renal arteries bilaterally. Inflow: Patent without evidence of aneurysm, dissection, vasculitis or significant stenosis. Veins: No obvious venous abnormality within the limitations of this arterial phase study. NON-VASCULAR Hepatobiliary: The liver is normal in size without focal abnormality. No intrahepatic or extrahepatic biliary ductal dilation. The gallbladder appears normal. Spleen: Normal in size without focal abnormality. Pancreas: No pancreatic ductal dilatation or surrounding inflammatory changes. Adrenals/Urinary Tract: Adrenal glands are unremarkable. Kidneys are normal, without renal calculi, focal lesion, or hydronephrosis. Bladder is unremarkable. Stomach/Bowel: The stomach, small bowel and large bowel are normal in caliber without abnormal wall thickening or surrounding inflammatory changes. The appendix is normal. Reproductive: Uterus and bilateral adnexa are unremarkable. Lymphatic: No enlarged lymph nodes in the abdomen or pelvis. Other: No abdominopelvic ascites. Musculoskeletal: No aggressive osseous lesions. The soft tissues are unremarkable. Review of  the MIP images confirms the above findings. IMPRESSION: Vascular: 1. No findings of aortic aneurysm or dissection. No acute vascular findings in the chest, abdomen or pelvis. Nonvascular: 1. No acute findings in the chest, abdomen or pelvis. Electronically Signed   By: Albin Felling M.D.   On: 01/15/2022 16:33      IMPRESSION:   Dark  emesis associated with spine pain.  Labs and imaging unremarkable.  Not anemic.  No chronic GI symptoms.  FOBT negative.    Fatty liver, obesity.  Mild transaminase elevation.  No gallbladder, biliary tree abnormalities on imaging.    Lumbar spondylosis, deg disc dz with chronic low back pain.  Suspect this is the source of her pain complaints.   PLAN:     Plan EGD tomorrow.  Allow clears now and possibly advance diet later today.  N.p.o. after midnight.  Continue the schedule twice daily IV Protonix  Flank/back pain warrants further investigation, do not think this is GI in etiology.   Azucena Freed  01/16/2022, 9:45 AM Phone [phone]

## 2022-01-16 NOTE — Progress Notes (Signed)
Patient was given seroquel 25mg  x 4 tabs to equal 200mg . Patient only took 2 and refused to take the remaining says only take 100mg  at home and occasionally 200mg  only if needed will contact md to see if order can be changed.

## 2022-01-16 NOTE — ED Notes (Signed)
Phlebotomy notified of need for labs drawn d/t difficult stick.

## 2022-01-16 NOTE — Consult Note (Addendum)
[facility] Consult: 9:45 AM 01/16/2022  LOS: 0 days  Wednesday   Referring Provider: Dr Loann Quill  Primary Care Physician:  Mack Hook, MD Primary Gastroenterologist:  Althia Forts     Reason for Consultation:  anemia.  Marland Kitchen     HPI: Cheyenne Fowler is a 56 y.o. female.  PMH DM2 on Glucophage.  Bipolar disorder.  Polysubstance abuse including alcohol, cocaine.  Dyslipidemia.  Mild hepatic steatosis on CT in 2018.  No prior colonoscopy or EGD.  Per usual routine, on Monday evening the patient and her husband split 120 ounces of Miller high life beer between the 2 of them.  He probably drank more than she did.  Normally drinks this amount 4 to 5 days a week.  Does not suffer from significant heartburn symptoms.  Good appetite.  Regular bowel movements.   Acute onset R low back/flank pain on beginning early morning 1/24.  Began vomiting non-bloody then dark emesis.  No BM's since brown stool on Monday night.  Throat pain as a result of repeated vomiting.  No fevers, urinary symptoms.  Since arrival in ED 18 h ago, no recurrent vomiting Initial heart rate 104 but in the 70s to 80s on repeated remeasure.  Some BPs a bit high with diastolics in the 02R but have normalized to 120s/70s.  Excellent room air saturations.  Has not vomited since arriving in the ED and initiating scheduled IV Protonix.  Has not required antiemetics.  This morning she is hungry.  Still having persistent low back and right flank pain and she says this is reminiscent of when she has had "kidney infection "in the past.  Has not seen any bloody stool or had urinary tract symptoms.  She took 1 Aleve 4 days ago but normally does not use this with any frequency.  CT angio chest/abdomen/pelvis: Unremarkable vascular and nonvascular imaging. UA  unremarkable Hb 14.5, MCV 91.  Normal WBCs and platelets. T bili, alk phos normal.  AST/ALT 59/68. BUN/creatinine, electrolytes normal.  Glucose 150.  A1c 6.6. INR normal. FOBT negative  Lives with her husband.  Previously worked as a Programmer, systems.  Smokes half a pack of cigarettes daily. Family history pertinent for liver cancer in her mother.  Maternal aunts with history of pancreatic and breast cancer.  Brother and cousins with peptic ulcer disease.  Past Medical History:  Diagnosis Date   Anemia    Bipolar disorder ([place])    Diabetes mellitus ([place]) 2022   type 2   Environmental and seasonal allergies 1968   Heart murmur     Past Surgical History:  Procedure Laterality Date   [place]     Umbilical hernia in the 2nd grade   TUBAL LIGATION  1990    Prior to Admission medications   Medication Sig Start Date End Date Taking? Authorizing Provider  atorvastatin (LIPITOR) 20 MG tablet 1 tab by mouth daily with evening meal 12/03/21  Yes Mack Hook, MD  B Complex Vitamins (VITAMIN B COMPLEX) TABS  1 tab by mouth 3 times daily Patient taking differently: 1 tab by mouth 2 times daily 11/22/21  Yes Julieanne Manson, MD  cetirizine (ZYRTEC) 10 MG tablet Take 10 mg by mouth daily.   Yes [provider]  diazepam (VALIUM) 5 MG tablet Take one tablet by mouth with food one hour prior to procedure. May repeat 30 minutes prior if needed. 11/26/21  Yes Ellin Goodie E, NP  gabapentin (NEURONTIN) 100 MG capsule Take 1 capsule (100 mg total) by mouth at bedtime. 11/22/21  Yes Julieanne Manson, MD  hydrOXYzine (VISTARIL) 50 MG capsule Take 1 capsule (50 mg total) by mouth 3 (three) times daily as needed. Patient taking differently: Take 50 mg by mouth 3 (three) times daily as needed for anxiety. 11/12/21  Yes Toy Cookey E, NP  lamoTRIgine (LAMICTAL) 100 MG tablet Take 1 tablet (100 mg total) by mouth daily. 11/12/21  Yes  Toy Cookey E, NP  losartan (COZAAR) 50 MG tablet Take 1 tablet (50 mg total) by mouth daily. 11/22/21  Yes Julieanne Manson, MD  metFORMIN (GLUCOPHAGE-XR) 500 MG 24 hr tablet 1 tab by mouth twice daily with meals 12/03/21  Yes Julieanne Manson, MD  QUEtiapine (SEROQUEL) 200 MG tablet Take 1 tablet (200 mg total) by mouth at bedtime. 11/12/21  Yes Toy Cookey E, NP    Scheduled Meds:  atorvastatin  20 mg Oral QPM   bupivacaine  3 mL Other Once   gabapentin  100 mg Oral QHS   insulin aspart  0-9 Units Subcutaneous Q4H   lamoTRIgine  100 mg Oral Daily   losartan  50 mg Oral Daily   pantoprazole (PROTONIX) IV  40 mg Intravenous Q12H   QUEtiapine  200 mg Oral QHS   Infusions:  PRN Meds: acetaminophen **OR** acetaminophen, hydrOXYzine, ondansetron **OR** ondansetron (ZOFRAN) IV, traMADol   Allergies as of 01/15/2022 - Review Complete 01/15/2022  Allergen Reaction Noted   Penicillins  08/17/2007   Sulfonamide derivatives  08/17/2007    Family History  Problem Relation Age of Onset   Diabetes Mother    Cancer Mother        liver--not clear if this was the primary   COPD Mother    Cancer Maternal Grandfather        throat   Schizophrenia Brother    Bipolar disorder Brother    Cancer Brother        Not sure of primary   Breast cancer Maternal Aunt        Cause of death   Cancer Maternal Aunt        pancreatic   Bipolar disorder Sister    Diabetes Sister     Social History   Socioeconomic History   Marital status: Married    Spouse name: Evern Bio   Number of children: 3   Years of education: Not on file   Highest education level: GED or equivalent  Occupational History   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  No MJ for 2 months.   Sexual activity: Yes    Birth control/protection: Surgical  Other Topics Concern   Not on file  Social History Narrative   She and her husband liver with her son and helping out with her granddaughter.    Born and raised in [place] Strain: Not on file  Food Insecurity: Not on file  Transportation Needs: Not on file  Physical Activity: Not on file  Stress: Not on file  Social Connections: Not on file  Intimate Partner Violence: Not on file    REVIEW OF SYSTEMS: Constitutional: No weakness, no fatigue. ENT:  No nose bleeds Pulm: No shortness of breath or cough. CV:  No palpitations, no LE edema.  Angina. GU:  No hematuria, no frequency, no dysuria.  She takes B12 supplements daily which cause her urine to be bright yellow. GI: See HPI. Heme: No unusual bleeding or bruising. Transfusions: None. Neuro:  No headaches, no peripheral tingling or numbness Derm:  No itching, no rash or sores.  Endocrine:  No sweats or chills.  No polyuria or dysuria Immunization: Reviewed. Travel:  Not queried.   PHYSICAL EXAM: Vital signs in last 24 hours: Vitals:   01/16/22 0135 01/16/22 0745  BP: 122/68 126/77  Pulse: 88 78  Resp: 18 16  Temp: 98.1 F (36.7 C) 98.7 F (37.1 C)  SpO2: 99% 98%   Wt Readings from Last 3 Encounters:  11/26/21 102.1 kg  11/22/21 102.1 kg  08/23/21 98.7 kg    General: Pleasant, cooperative a bit anxious.  Does not look acutely ill.  No distress. Head: No facial asymmetry or swelling.  No signs of head trauma. Eyes: Conjunctiva pink.  No scleral icterus.  EOMI. Ears: No hearing deficit Nose: No congestion or discharge Mouth: Fair dentition.  Tongue midline.  Mucosa moist, pink, clear. Neck: No JVD, no masses, no  thyromegaly Lungs: Diminished breath sounds but clear without labored breathing.  No cough Heart: RRR.  No MRG.  S1, S2 present. Abdomen: Soft without tenderness.  Not distended.  Active bowel sounds.  No masses, HSM, bruits, hernias.   Rectal: DRE not performed Musc/Skeltl: Tenderness with percussion over the lumbar spine.  No flank tenderness.  No joint redness swelling or gross deformity. Extremities: No CCE. Neurologic: Fully alert and oriented.  Able to provide excellent history.  Moves all 4 limbs without tremor or weakness. Skin: No suspicious lesions, rashes, sores. Nodes: No cervical adenopathy Psych: Cooperative.  Fluid speech.  Slightly anxious but not agitated  Intake/Output from previous day: No intake/output data recorded. Intake/Output this shift: No intake/output data recorded.  LAB RESULTS: Recent Labs    01/15/22 0811 01/15/22 1550 01/16/22 0813  WBC 10.8* 10.1 8.9  HGB 14.2 14.4 14.5  HCT 44.8 45.9 44.8  PLT 261 266 258   BMET Lab Results  Component Value Date   NA 138 01/16/2022   NA 141 01/15/2022   NA 145 (H) 11/22/2021   K 3.5 01/16/2022   K 3.8 01/15/2022   K 4.3 11/22/2021   CL 104 01/16/2022   CL 108 01/15/2022   CL 105 11/22/2021   CO2 24 01/16/2022   CO2 23 01/15/2022   CO2 26 11/22/2021   GLUCOSE 114 (H) 01/16/2022   GLUCOSE 150 (H) 01/15/2022   GLUCOSE 85 11/22/2021   BUN 6 01/16/2022   BUN 7 01/15/2022   BUN 15 11/22/2021   CREATININE 0.90 01/16/2022  CREATININE 0.94 01/15/2022   CREATININE 0.86 11/22/2021   CALCIUM 9.1 01/16/2022   CALCIUM 9.0 01/15/2022   CALCIUM 9.0 11/22/2021   LFT Recent Labs    01/15/22 1550 01/16/22 0813  PROT 6.9 6.5  ALBUMIN 3.8 3.4*  AST 59* 57*  ALT 68* 67*  ALKPHOS 73 76  BILITOT 0.4 0.4  BILIDIR <0.1  --   IBILI NOT CALCULATED  --    PT/INR Lab Results  Component Value Date   INR 1.0 01/16/2022   INR 1.0 01/15/2022   Hepatitis Panel No results for input(s): HEPBSAG, HCVAB,  HEPAIGM, HEPBIGM in the last 72 hours. C-Diff No components found for: CDIFF Lipase     Component Value Date/Time   LIPASE 33 01/15/2022 1550    Drugs of Abuse     Component Value Date/Time   LABOPIA NONE DETECTED 05/21/2012 1626   COCAINSCRNUR POSITIVE (A) 05/21/2012 1626   LABBENZ POSITIVE (A) 05/21/2012 1626   AMPHETMU NONE DETECTED 05/21/2012 1626   THCU POSITIVE (A) 05/21/2012 1626   LABBARB NONE DETECTED 05/21/2012 1626     RADIOLOGY STUDIES: CT Angio Chest/Abd/Pel for Dissection W and/or Wo Contrast  Result Date: 01/15/2022 CLINICAL DATA:  Aortic aneurysm, known or suspected Sudden onset back pain woke her up at 3 AM with multiple episodes of hematemesis. History of frequent alcohol use EXAM: CT ANGIOGRAPHY CHEST, ABDOMEN AND PELVIS TECHNIQUE: Non-contrast CT of the chest was initially obtained. Multidetector CT imaging through the chest, abdomen and pelvis was performed using the standard protocol during bolus administration of intravenous contrast. Multiplanar reconstructed images and MIPs were obtained and reviewed to evaluate the vascular anatomy. RADIATION DOSE REDUCTION: This exam was performed according to the departmental dose-optimization program which includes automated exposure control, adjustment of the mA and/or kV according to patient size and/or use of iterative reconstruction technique. CONTRAST:  61mL OMNIPAQUE IOHEXOL 350 MG/ML SOLN COMPARISON:  Multiple priors FINDINGS: CTA CHEST Cardiovascular: Preferential opacification of the thoracic aorta. No evidence of thoracic aortic aneurysm or dissection. The supra-aortic branch vessels are patent proximally. Normal heart size. No pericardial effusion. Mediastinum/Nodes: No enlarged mediastinal, hilar, or axillary lymph nodes. The thyroid gland appears normal. Lungs/Pleura: No pleural effusion. No pneumothorax. Subsegmental atelectasis in the dependent regions of the lungs. No lobar consolidation. Motion artifacts obscures  fine detail of the pulmonary parenchyma. CTA ABDOMEN AND PELVIS VASCULAR Aorta: Normal caliber aorta without aneurysm, dissection, vasculitis or significant stenosis. Celiac: Patent without evidence of aneurysm, dissection, vasculitis or significant stenosis. SMA: Patent without evidence of aneurysm, dissection, vasculitis or significant stenosis. IMA: Patent. Renals: Both renal arteries are patent without evidence of aneurysm, dissection, vasculitis, fibromuscular dysplasia or significant stenosis. Single renal arteries bilaterally. Inflow: Patent without evidence of aneurysm, dissection, vasculitis or significant stenosis. Veins: No obvious venous abnormality within the limitations of this arterial phase study. NON-VASCULAR Hepatobiliary: The liver is normal in size without focal abnormality. No intrahepatic or extrahepatic biliary ductal dilation. The gallbladder appears normal. Spleen: Normal in size without focal abnormality. Pancreas: No pancreatic ductal dilatation or surrounding inflammatory changes. Adrenals/Urinary Tract: Adrenal glands are unremarkable. Kidneys are normal, without renal calculi, focal lesion, or hydronephrosis. Bladder is unremarkable. Stomach/Bowel: The stomach, small bowel and large bowel are normal in caliber without abnormal wall thickening or surrounding inflammatory changes. The appendix is normal. Reproductive: Uterus and bilateral adnexa are unremarkable. Lymphatic: No enlarged lymph nodes in the abdomen or pelvis. Other: No abdominopelvic ascites. Musculoskeletal: No aggressive osseous lesions. The soft tissues are unremarkable. Review of  the MIP images confirms the above findings. IMPRESSION: Vascular: 1. No findings of aortic aneurysm or dissection. No acute vascular findings in the chest, abdomen or pelvis. Nonvascular: 1. No acute findings in the chest, abdomen or pelvis. Electronically Signed   By: Albin Felling M.D.   On: 01/15/2022 16:33      IMPRESSION:   Dark  emesis associated with spine pain.  Labs and imaging unremarkable.  Not anemic.  No chronic GI symptoms.  FOBT negative.    Fatty liver, obesity.  Mild transaminase elevation.  No gallbladder, biliary tree abnormalities on imaging.    Lumbar spondylosis, deg disc dz with chronic low back pain.  Suspect this is the source of her pain complaints.   PLAN:     Plan EGD tomorrow.  Allow clears now and possibly advance diet later today.  N.p.o. after midnight.  Continue the schedule twice daily IV Protonix  Flank/back pain warrants further investigation, do not think this is GI in etiology.   Azucena Freed  01/16/2022, 9:45 AM Phone [phone]

## 2022-01-16 NOTE — ED Notes (Signed)
Lunch tray delivered to pt at this time.

## 2022-01-16 NOTE — ED Notes (Signed)
Attempted to obtain morning labs, unable to obtain

## 2022-01-16 NOTE — Progress Notes (Signed)
PROGRESS NOTE    Cheyenne Fowler  YJE:[id number] DOB: 1966/01/21 DOA: 01/15/2022 PCP: Mack Hook, MD   Brief Narrative:  HPI: Cheyenne Fowler is a 56 y.o. female with medical history significant for bipolar disorder, T2DM, HTN, HLD, chronic low back pain, substance use disorder (alcohol, cocaine, tobacco) who presented to the ED for evaluation of bloody emesis.  Patient states she was in her usual state of health until the evening of 01/14/2022 when she developed right flank pain which she said was different than her usual chronic low back pain.  She went to sleep and woke up around 530-6 AM 1/24 feeling as if she had to vomit.  She states she did throw up to the bathroom, initially emesis had a mucus appearance.  She had continued emesis which was subsequently consisting of bright red blood.  She came to the ED and while in the triage room she had several more episodes of bloody red emesis.  She said she did feel lightheaded and broke out in sweats during this time.  She has not had any abdominal pain or any bowel movements since onset of her symptoms.  She denies any similar episodes in the past.  She denies any other obvious bleeding.  She states that she does drink alcohol, at least a 40 ounce beer and sometimes an extra 12 ounce beer 4-5 days/week.  Last drink was evening of 1/23.  She denies any history of withdrawal.  She is not taking any aspirin.  She states that she usually takes Tylenol for pain but will take Aleve occasionally when she does not have Tylenol.  She says she last took Aleve about 3 days ago.   ED Course:  Initial vitals showed BP 129/90, pulse 104, RR 26, temp 98.3 F, SPO2 93% on room air.   Labs show WBC 10.8, hemoglobin 14.2, platelets 261,000, sodium 141, potassium 3.8, bicarb 23, BUN 7, creatinine 0.94, serum glucose 150, lipase 33, troponin 5, AST 59, ALT 68, alk phos 73, total bilirubin 0.4, serum ethanol <10, FOBT negative.   Urinalysis negative for  UTI.  SARS-CoV-2 and influenza PCR negative.  Repeat hemoglobin 14.4 approximately 7.5 hrs after initial labs.   CTA chest/abdomen/pelvis was negative for findings of aortic aneurysm or dissection or acute vascular or nonvascular findings.   Of note, patient did have extravasation of contrast at right forearm.  She was noted to have moderate swelling without erythema, discoloration, blisters.  ROM in fingers and radial pulse are intact.   Patient was given oral Zofran x2, IV Protonix 40 mg once.  EDP discussed with on-call GI, Dr. Rush Landmark who recommended continue IV Protonix 40 mg twice daily and medical admission, GI will consult in a.m.  The hospitalist service was consulted to admit for further evaluation and management.  Assessment & Plan:   Principal Problem:   Hematemesis/vomiting blood Active Problems:   Bipolar I disorder, most recent episode depressed (HCC)   Type 2 diabetes mellitus ([place])   Hypertension associated with diabetes ([place])   Alcohol use   Hyperlipidemia associated with type 2 diabetes mellitus ([place])   Extravasation of intravenous contrast medium  Hematemesis/vomiting blood- (present on admission) New onset hematemesis with stable hemoglobin.  Could be alcohol associated gastritis/PUD or Mallory-Weiss tearing.  Seen by GI, plan for EGD tomorrow morning.  Continue PPI.   Alcohol use- (present on admission) Reports drinking 40-52 ounce beer 4-5x/week.  No sign of withdrawal on admission or even now.  Last drink on the  evening of 01/14/2022.  Continue CIWA with as needed Ativan.   Type 2 diabetes mellitus ([place]): Hold metformin.  Continue SSI.  Last A1c 6.6 11/22/2021.   Bipolar I disorder, most recent episode depressed ([place])- (present on admission) Mood currently stable.  Continue Lamictal, Seroquel, and hydroxyzine as needed.   Hypertension associated with diabetes ([place])- (present on admission) Continue losartan 50 mg daily.  Blood pressure controlled.    Hyperlipidemia associated with type 2 diabetes mellitus ([place])- (present on admission) Continue atorvastatin 20 mg.   Extravasation of intravenous contrast medium- (present on admission) IV contrast extravasated into right upper extremity at time of CTA.  Radial pulses, finger strength intact.  Skin is warm and without erythema or cyanosis.  Continue to monitor closely, ice to area as instructed.  DVT prophylaxis: SCDs Start: 01/15/22 1916   Code Status: Full Code  Family Communication:  None present at bedside.  Plan of care discussed with patient in length and he/she verbalized understanding and agreed with it.  Status is: Observation  The patient will require care spanning > 2 midnights and should be moved to inpatient because: Needs EGD tomorrow morning.      Estimated body mass index is 41.15 kg/m as calculated from the following:   Height as of 11/26/21: $RemoveBef'5\' 2"'FVSROhgeXY$  (1.575 m).   Weight as of 11/26/21: 102.1 kg.    Nutritional Assessment: There is no height or weight on file to calculate BMI.. Seen by dietician.  I agree with the assessment and plan as outlined below: Nutrition Status:        . Skin Assessment: I have examined the patient's skin and I agree with the wound assessment as performed by the wound care RN as outlined below:    Consultants:  GI  Procedures:  None  Antimicrobials:  Anti-infectives (From admission, onward)    None         Subjective: Seen and examined in the ED.  She has no complaints.  Objective: Vitals:   01/15/22 2200 01/16/22 0001 01/16/22 0135 01/16/22 0745  BP: 119/61  122/68 126/77  Pulse: 96 88 88 78  Resp: $Remo'15 16 18 16  'iWAEm$ Temp:   98.1 F (36.7 C) 98.7 F (37.1 C)  TempSrc:   Oral Oral  SpO2: 96% 96% 99% 98%   No intake or output data in the 24 hours ending 01/16/22 1026 There were no vitals filed for this visit.  Examination:  General exam: Appears calm and comfortable  Respiratory system: Clear to auscultation.  Respiratory effort normal. Cardiovascular system: S1 & S2 heard, RRR. No JVD, murmurs, rubs, gallops or clicks. No pedal edema. Gastrointestinal system: Abdomen is nondistended, soft and very mild epigastric tenderness. No organomegaly or masses felt. Normal bowel sounds heard. Central nervous system: Alert and oriented. No focal neurological deficits. Extremities: Symmetric 5 x 5 power. Skin: No rashes, lesions or ulcers Psychiatry: Judgement and insight appear normal. Mood & affect appropriate.    Data Reviewed: I have personally reviewed following labs and imaging studies  CBC: Recent Labs  Lab 01/15/22 0811 01/15/22 1550 01/16/22 0813  WBC 10.8* 10.1 8.9  NEUTROABS 5.4 6.3  --   HGB 14.2 14.4 14.5  HCT 44.8 45.9 44.8  MCV 91.4 91.4 91.1  PLT 261 266 449   Basic Metabolic Panel: Recent Labs  Lab 01/15/22 0811 01/16/22 0813  NA 141 138  K 3.8 3.5  CL 108 104  CO2 23 24  GLUCOSE 150* 114*  BUN 7 6  CREATININE  0.94 0.90  CALCIUM 9.0 9.1   GFR: CrCl cannot be calculated (Unknown ideal weight.). Liver Function Tests: Recent Labs  Lab 01/15/22 1550 01/16/22 0813  AST 59* 57*  ALT 68* 67*  ALKPHOS 73 76  BILITOT 0.4 0.4  PROT 6.9 6.5  ALBUMIN 3.8 3.4*   Recent Labs  Lab 01/15/22 1550  LIPASE 33   No results for input(s): AMMONIA in the last 168 hours. Coagulation Profile: Recent Labs  Lab 01/15/22 1823 01/16/22 0813  INR 1.0 1.0   Cardiac Enzymes: No results for input(s): CKTOTAL, CKMB, CKMBINDEX, TROPONINI in the last 168 hours. BNP (last 3 results) No results for input(s): PROBNP in the last 8760 hours. HbA1C: No results for input(s): HGBA1C in the last 72 hours. CBG: Recent Labs  Lab 01/15/22 1942 01/15/22 2003 01/16/22 0742  GLUCAP 119* 118* 134*   Lipid Profile: No results for input(s): CHOL, HDL, LDLCALC, TRIG, CHOLHDL, LDLDIRECT in the last 72 hours. Thyroid Function Tests: No results for input(s): TSH, T4TOTAL, FREET4, T3FREE,  THYROIDAB in the last 72 hours. Anemia Panel: No results for input(s): VITAMINB12, FOLATE, FERRITIN, TIBC, IRON, RETICCTPCT in the last 72 hours. Sepsis Labs: No results for input(s): PROCALCITON, LATICACIDVEN in the last 168 hours.  Recent Results (from the past 240 hour(s))  Resp Panel by RT-PCR (Flu A&B, Covid) Nasopharyngeal Swab     Status: None   Collection Time: 01/15/22  3:50 PM   Specimen: Nasopharyngeal Swab; Nasopharyngeal(NP) swabs in vial transport medium  Result Value Ref Range Status   SARS Coronavirus 2 by RT PCR NEGATIVE NEGATIVE Final    Comment: (NOTE) SARS-CoV-2 target nucleic acids are NOT DETECTED.  The SARS-CoV-2 RNA is generally detectable in upper respiratory specimens during the acute phase of infection. The lowest concentration of SARS-CoV-2 viral copies this assay can detect is 138 copies/mL. A negative result does not preclude SARS-Cov-2 infection and should not be used as the sole basis for treatment or other patient management decisions. A negative result may occur with  improper specimen collection/handling, submission of specimen other than nasopharyngeal swab, presence of viral mutation(s) within the areas targeted by this assay, and inadequate number of viral copies(<138 copies/mL). A negative result must be combined with clinical observations, patient history, and epidemiological information. The expected result is Negative.  Fact Sheet for Patients:  EntrepreneurPulse.com.au  Fact Sheet for Healthcare Providers:  IncredibleEmployment.be  This test is no t yet approved or cleared by the [place] FDA and  has been authorized for detection and/or diagnosis of SARS-CoV-2 by FDA under an Emergency Use Authorization (EUA). This EUA will remain  in effect (meaning this test can be used) for the duration of the COVID-19 declaration under Section 564(b)(1) of the Act, 21 U.S.C.section 360bbb-3(b)(1), unless the  authorization is terminated  or revoked sooner.       Influenza A by PCR NEGATIVE NEGATIVE Final   Influenza B by PCR NEGATIVE NEGATIVE Final    Comment: (NOTE) The Xpert Xpress SARS-CoV-2/FLU/RSV plus assay is intended as an aid in the diagnosis of influenza from Nasopharyngeal swab specimens and should not be used as a sole basis for treatment. Nasal washings and aspirates are unacceptable for Xpert Xpress SARS-CoV-2/FLU/RSV testing.  Fact Sheet for Patients: EntrepreneurPulse.com.au  Fact Sheet for Healthcare Providers: IncredibleEmployment.be  This test is not yet approved or cleared by the [place] FDA and has been authorized for detection and/or diagnosis of SARS-CoV-2 by FDA under an Emergency Use Authorization (EUA). This EUA will remain  in effect (meaning this test can be used) for the duration of the COVID-19 declaration under Section 564(b)(1) of the Act, 21 U.S.C. section 360bbb-3(b)(1), unless the authorization is terminated or revoked.  Performed at [facility], [place] [address] [place], [place] [address]      Radiology Studies: CT Angio Chest/Abd/Pel for Dissection W and/or Wo Contrast  Result Date: 01/15/2022 CLINICAL DATA:  Aortic aneurysm, known or suspected Sudden onset back pain woke her up at 3 AM with multiple episodes of hematemesis. History of frequent alcohol use EXAM: CT ANGIOGRAPHY CHEST, ABDOMEN AND PELVIS TECHNIQUE: Non-contrast CT of the chest was initially obtained. Multidetector CT imaging through the chest, abdomen and pelvis was performed using the standard protocol during bolus administration of intravenous contrast. Multiplanar reconstructed images and MIPs were obtained and reviewed to evaluate the vascular anatomy. RADIATION DOSE REDUCTION: This exam was performed according to the departmental dose-optimization program which includes automated exposure control, adjustment of the mA and/or kV  according to patient size and/or use of iterative reconstruction technique. CONTRAST:  90mL OMNIPAQUE IOHEXOL 350 MG/ML SOLN COMPARISON:  Multiple priors FINDINGS: CTA CHEST Cardiovascular: Preferential opacification of the thoracic aorta. No evidence of thoracic aortic aneurysm or dissection. The supra-aortic branch vessels are patent proximally. Normal heart size. No pericardial effusion. Mediastinum/Nodes: No enlarged mediastinal, hilar, or axillary lymph nodes. The thyroid gland appears normal. Lungs/Pleura: No pleural effusion. No pneumothorax. Subsegmental atelectasis in the dependent regions of the lungs. No lobar consolidation. Motion artifacts obscures fine detail of the pulmonary parenchyma. CTA ABDOMEN AND PELVIS VASCULAR Aorta: Normal caliber aorta without aneurysm, dissection, vasculitis or significant stenosis. Celiac: Patent without evidence of aneurysm, dissection, vasculitis or significant stenosis. SMA: Patent without evidence of aneurysm, dissection, vasculitis or significant stenosis. IMA: Patent. Renals: Both renal arteries are patent without evidence of aneurysm, dissection, vasculitis, fibromuscular dysplasia or significant stenosis. Single renal arteries bilaterally. Inflow: Patent without evidence of aneurysm, dissection, vasculitis or significant stenosis. Veins: No obvious venous abnormality within the limitations of this arterial phase study. NON-VASCULAR Hepatobiliary: The liver is normal in size without focal abnormality. No intrahepatic or extrahepatic biliary ductal dilation. The gallbladder appears normal. Spleen: Normal in size without focal abnormality. Pancreas: No pancreatic ductal dilatation or surrounding inflammatory changes. Adrenals/Urinary Tract: Adrenal glands are unremarkable. Kidneys are normal, without renal calculi, focal lesion, or hydronephrosis. Bladder is unremarkable. Stomach/Bowel: The stomach, small bowel and large bowel are normal in caliber without abnormal  wall thickening or surrounding inflammatory changes. The appendix is normal. Reproductive: Uterus and bilateral adnexa are unremarkable. Lymphatic: No enlarged lymph nodes in the abdomen or pelvis. Other: No abdominopelvic ascites. Musculoskeletal: No aggressive osseous lesions. The soft tissues are unremarkable. Review of the MIP images confirms the above findings. IMPRESSION: Vascular: 1. No findings of aortic aneurysm or dissection. No acute vascular findings in the chest, abdomen or pelvis. Nonvascular: 1. No acute findings in the chest, abdomen or pelvis. Electronically Signed   By: Albin Felling M.D.   On: 01/15/2022 16:33    Scheduled Meds:  atorvastatin  20 mg Oral QPM   bupivacaine  3 mL Other Once   gabapentin  100 mg Oral QHS   insulin aspart  0-9 Units Subcutaneous Q4H   lamoTRIgine  100 mg Oral Daily   losartan  50 mg Oral Daily   pantoprazole (PROTONIX) IV  40 mg Intravenous Q12H   QUEtiapine  200 mg Oral QHS   Continuous Infusions:   LOS: 0 days   Time spent:  30 minutes  Darliss Cheney, MD [facility]  01/16/2022, 10:26 AM  Please page via [place] and do not message via secure chat for anything urgent. Secure chat can be used for anything non urgent.  How to contact the [facility] Attending or Consulting provider [place] or covering provider during after hours [place], for this patient?  Check the care team in [facility] and look for a) attending/consulting [facility] provider listed and b) the [facility] team listed. Page or secure chat [phone]. Log into [email or web address] and use [place]'s universal password to access. If you do not have the password, please contact the hospital operator. Locate the [facility] provider you are looking for under [facility] and page to a number that you can be directly reached. If you still have difficulty reaching the provider, please page the [facility] (Director on Call) for the Hospitalists listed on amion for assistance.

## 2022-01-16 NOTE — ED Notes (Signed)
Hospitalist at bedside assessing pt at this time.  

## 2022-01-17 ENCOUNTER — Inpatient Hospital Stay (HOSPITAL_COMMUNITY): Payer: Medicaid Other | Admitting: Anesthesiology

## 2022-01-17 ENCOUNTER — Encounter (HOSPITAL_COMMUNITY): Payer: Self-pay | Admitting: Family Medicine

## 2022-01-17 ENCOUNTER — Encounter (HOSPITAL_COMMUNITY)
Admission: EM | Disposition: A | Discharge status: Home / Self Care | Payer: Self-pay | Source: Home / Self Care | Attending: Internal Medicine

## 2022-01-17 DIAGNOSIS — K2990 Gastroduodenitis, unspecified, without bleeding: Secondary | ICD-10-CM

## 2022-01-17 DIAGNOSIS — K2100 Gastro-esophageal reflux disease with esophagitis, without bleeding: Secondary | ICD-10-CM

## 2022-01-17 DIAGNOSIS — K2970 Gastritis, unspecified, without bleeding: Secondary | ICD-10-CM

## 2022-01-17 DIAGNOSIS — K209 Esophagitis, unspecified without bleeding: Secondary | ICD-10-CM

## 2022-01-17 HISTORY — PX: BIOPSY: SHX5522

## 2022-01-17 HISTORY — PX: ESOPHAGOGASTRODUODENOSCOPY (EGD) WITH PROPOFOL: SHX5813

## 2022-01-17 LAB — CBC
HCT: 44.4 % (ref 36.0–46.0)
Hemoglobin: 14.7 g/dL (ref 12.0–15.0)
MCH: 29.5 pg (ref 26.0–34.0)
MCHC: 33.1 g/dL (ref 30.0–36.0)
MCV: 89 fL (ref 80.0–100.0)
Platelets: 311 10*3/uL (ref 150–400)
RBC: 4.99 MIL/uL (ref 3.87–5.11)
RDW: 12.6 % (ref 11.5–15.5)
WBC: 9.8 10*3/uL (ref 4.0–10.5)
nRBC: 0 % (ref 0.0–0.2)

## 2022-01-17 LAB — GLUCOSE, CAPILLARY
Glucose-Capillary: 106 mg/dL — ABNORMAL HIGH (ref 70–99)
Glucose-Capillary: 107 mg/dL — ABNORMAL HIGH (ref 70–99)
Glucose-Capillary: 117 mg/dL — ABNORMAL HIGH (ref 70–99)
Glucose-Capillary: 124 mg/dL — ABNORMAL HIGH (ref 70–99)
Glucose-Capillary: 127 mg/dL — ABNORMAL HIGH (ref 70–99)
Glucose-Capillary: 128 mg/dL — ABNORMAL HIGH (ref 70–99)
Glucose-Capillary: 132 mg/dL — ABNORMAL HIGH (ref 70–99)

## 2022-01-17 SURGERY — ESOPHAGOGASTRODUODENOSCOPY (EGD) WITH PROPOFOL
Anesthesia: Monitor Anesthesia Care

## 2022-01-17 MED ORDER — LIDOCAINE 2% (20 MG/ML) 5 ML SYRINGE
INTRAMUSCULAR | Status: DC | PRN
  Administered 2022-01-17: 50 mg via INTRAVENOUS

## 2022-01-17 MED ORDER — PROPOFOL 500 MG/50ML IV EMUL
INTRAVENOUS | Status: DC | PRN
Start: 2022-01-17 — End: 2022-01-17
  Administered 2022-01-17: 85 ug/kg/min via INTRAVENOUS

## 2022-01-17 MED ORDER — PHENOL 1.4 % MT LIQD: 1.0000 | OROMUCOSAL | Status: DC | PRN

## 2022-01-17 MED ORDER — PROPOFOL 10 MG/ML IV BOLUS
INTRAVENOUS | Status: DC | PRN
Start: 2022-01-17 — End: 2022-01-17
  Administered 2022-01-17: 100 mg via INTRAVENOUS

## 2022-01-17 MED ORDER — LACTATED RINGERS IV SOLN
INTRAVENOUS | Status: DC
  Administered 2022-01-17: 1000 mL via INTRAVENOUS

## 2022-01-17 MED ORDER — PANTOPRAZOLE SODIUM 40 MG PO TBEC
40.0000 mg | DELAYED_RELEASE_TABLET | Freq: Two times a day (BID) | ORAL | Status: DC
  Administered 2022-01-17 – 2022-01-18 (×3): 40 mg via ORAL
  Filled 2022-01-17 (×2): qty 1

## 2022-01-17 SURGICAL SUPPLY — 15 items

## 2022-01-17 NOTE — Transfer of Care (Signed)
Immediate Anesthesia Transfer of Care Note  Patient: Cheyenne Fowler  Procedure(s) Performed: ESOPHAGOGASTRODUODENOSCOPY (EGD) WITH PROPOFOL BIOPSY  Patient Location: PACU  Anesthesia Type:MAC  Level of Consciousness: awake, alert , oriented and patient cooperative  Airway & Oxygen Therapy: Patient Spontanous Breathing and Patient connected to nasal cannula oxygen  Post-op Assessment: Report given to RN, Post -op Vital signs reviewed and stable and Patient moving all extremities  Post vital signs: Reviewed and stable  Last Vitals:  Vitals Value Taken Time  BP 124/59   Temp    Pulse 95   Resp 18   SpO2 95     Last Pain:  Vitals:   01/17/22 0847  TempSrc: Oral  PainSc: 7       Patients Stated Pain Goal: 0 (23/30/07 6226)  Complications: No notable events documented.

## 2022-01-17 NOTE — Anesthesia Postprocedure Evaluation (Signed)
Anesthesia Post Note  Patient: Cheyenne Fowler  Procedure(s) Performed: ESOPHAGOGASTRODUODENOSCOPY (EGD) WITH PROPOFOL BIOPSY     Patient location during evaluation: PACU Anesthesia Type: MAC Level of consciousness: awake and alert Pain management: pain level controlled Vital Signs Assessment: post-procedure vital signs reviewed and stable Respiratory status: spontaneous breathing Cardiovascular status: stable Anesthetic complications: no   No notable events documented.  Last Vitals:  Vitals:   01/17/22 1130 01/17/22 1144  BP: (!) 152/87 136/80  Pulse: 75 86  Resp: 18 18  Temp: (!) 36.3 C 36.6 C  SpO2: 97% 98%    Last Pain:  Vitals:   01/17/22 1144  TempSrc: Oral  PainSc:                  Nolon Nations

## 2022-01-17 NOTE — Op Note (Signed)
[facility] [name] Procedure Date : 01/17/2022 MRN: [id number] Attending MD: [name] MD, MD Date of Birth: 05-Jun-1966 CSN: [id number] Age: 56 Admit Type: Inpatient Procedure:                Upper GI endoscopy Indications:              Upper abdominal pain, bloody emesis Providers:                [name] MD, MD, Doristine Johns, RN,                            Luan Moore, Technician, Cira Servant, CRNA Referring MD:              Medicines:                Monitored Anesthesia Care Complications:            No immediate complications. Estimated blood loss:                            Minimal. Estimated Blood Loss:     Estimated blood loss was minimal. Procedure:                Pre-Anesthesia Assessment:                           - Prior to the procedure, a History and Physical                            was performed, and patient medications and                            allergies were reviewed. The patient's tolerance of                            previous anesthesia was also reviewed. The risks                            and benefits of the procedure and the sedation                            options and risks were discussed with the patient.                            All questions were answered, and informed consent                            was obtained. Prior Anticoagulants: The patient has                            taken no previous anticoagulant or antiplatelet                            agents. ASA Grade Assessment: II - A patient with  mild systemic disease. After reviewing the risks                            and benefits, the patient was deemed in                            satisfactory condition to undergo the procedure.                           After obtaining informed consent, the endoscope was                            passed under direct vision. Throughout the                             procedure, the patient's blood pressure, pulse, and                            oxygen saturations were monitored continuously. The                            [id number]-[id number] ([id number]) Olympus endoscope was introduced                            through the mouth, and advanced to the third part                            of duodenum. The upper GI endoscopy was                            accomplished without difficulty. The patient                            tolerated the procedure well. Scope In: Scope Out: Findings:      LA Grade A (one or more mucosal breaks less than 5 mm, not extending       between tops of 2 mucosal folds) esophagitis with no bleeding was found       38 cm from the incisors. Biopsies were taken from the mid/proximal and       distal esophagus with a cold forceps for histology. Estimated blood loss       was minimal.      Patchy minimal inflammation characterized by erythema, friability and       granularity was found in the gastric body. Biopsies were taken from the       antrum, body, and fundus with a cold forceps for histology. Estimated       blood loss was minimal.      The examined duodenum was normal. Biopsies were taken with a cold       forceps for histology. Estimated blood loss was minimal.      The cardia and gastric fundus were normal on retroflexion.      The exam was otherwise without abnormality. Impression:               - LA Grade A reflux esophagitis with no bleeding.  Biopsied.                           - Gastritis. Biopsied.                           - Normal examined duodenum. Biopsied.                           - The examination was otherwise normal.                           - No obvious source for bloody emesis identified on                            this study. Recommendation:           - Return patient to hospital ward for ongoing care.                           - Advance diet as tolerated.                            - Continue present medications. I changed the IV                            pantoprazole to oral pantoprazole.                           - Await pathology results.                           - Avoid NSAIDs as able.                           I reviewed the results and recommendations with the                            patient's husband by phone. Procedure Code(s):        --- Professional ---                           [phone], Esophagogastroduodenoscopy, flexible,                            transoral; with biopsy, single or multiple Diagnosis Code(s):        --- Professional ---                           K21.00, Gastro-esophageal reflux disease with                            esophagitis, without bleeding                           K29.70, Gastritis, unspecified, without bleeding  R10.10, Upper abdominal pain, unspecified CPT copyright 2019 American Medical Association. All rights reserved. The codes documented in this report are preliminary and upon coder review may  be revised to meet current compliance requirements. [name] MD, MD 01/17/2022 11:04:55 AM This report has been signed electronically. Number of Addenda: 0

## 2022-01-17 NOTE — Anesthesia Procedure Notes (Signed)
Procedure Name: MAC Date/Time: 01/17/2022 10:38 AM Performed by: Lowella Dell, CRNA Pre-anesthesia Checklist: Patient identified, Emergency Drugs available, Suction available, Patient being monitored and Timeout performed Patient Re-evaluated:Patient Re-evaluated prior to induction Oxygen Delivery Method: Nasal cannula Placement Confirmation: positive ETCO2 Dental Injury: Teeth and Oropharynx as per pre-operative assessment

## 2022-01-17 NOTE — Anesthesia Preprocedure Evaluation (Signed)
Anesthesia Evaluation  Patient identified by MRN, date of birth, ID band Patient awake    Reviewed: Allergy & Precautions, NPO status , Patient's Chart, lab work & pertinent test results  Airway Mallampati: III  TM Distance: >3 FB Neck ROM: Full    Dental  (+) Dental Advisory Given, Teeth Intact   Pulmonary neg pulmonary ROS, Current Smoker,    Pulmonary exam normal breath sounds clear to auscultation       Cardiovascular hypertension, Pt. on medications Normal cardiovascular exam+ Valvular Problems/Murmurs  Rhythm:Regular Rate:Normal     Neuro/Psych negative neurological ROS     GI/Hepatic negative GI ROS, Neg liver ROS,   Endo/Other  diabetesMorbid obesity  Renal/GU negative Renal ROS     Musculoskeletal negative musculoskeletal ROS (+)   Abdominal   Peds  Hematology negative hematology ROS (+)   Anesthesia Other Findings   Reproductive/Obstetrics                             Anesthesia Physical Anesthesia Plan  ASA: 3  Anesthesia Plan: MAC   Post-op Pain Management: Minimal or no pain anticipated   Induction: Intravenous  PONV Risk Score and Plan: 1 and Propofol infusion, TIVA, Treatment may vary due to age or medical condition and Ondansetron  Airway Management Planned: Natural Airway  Additional Equipment:   Intra-op Plan:   Post-operative Plan:   Informed Consent: I have reviewed the patients History and Physical, chart, labs and discussed the procedure including the risks, benefits and alternatives for the proposed anesthesia with the patient or authorized representative who has indicated his/her understanding and acceptance.     Dental advisory given  Plan Discussed with: CRNA  Anesthesia Plan Comments:         Anesthesia Quick Evaluation

## 2022-01-17 NOTE — Interval H&P Note (Signed)
History and Physical Interval Note:  01/17/2022 9:18 AM  Cheyenne Fowler  has presented today for surgery, with the diagnosis of Dark emesis.  Right flank and low back pain.  Unremarkable imaging..  The various methods of treatment have been discussed with the patient and family. After consideration of risks, benefits and other options for treatment, the patient has consented to  Procedure(s): ESOPHAGOGASTRODUODENOSCOPY (EGD) WITH PROPOFOL (N/A) as a surgical intervention.  The patient's history has been reviewed, patient examined, no change in status, stable for surgery.  I have reviewed the patient's chart and labs.  Questions were answered to the patient's satisfaction.     [name]

## 2022-01-17 NOTE — Progress Notes (Signed)
PROGRESS NOTE    Cheyenne Fowler  ZGY:[id number] DOB: Apr 27, 1966 DOA: 01/15/2022 PCP: Mack Hook, MD   Brief Narrative:  HPI: Cheyenne Fowler is a 56 y.o. female with medical history significant for bipolar disorder, T2DM, HTN, HLD, chronic low back pain, substance use disorder (alcohol, cocaine, tobacco) who presented to the ED for evaluation of bloody emesis.  No other obvious bleeding.  In the emergency room hemodynamically stable.  Started on IV fluids, IV Protonix and admitted to the hospital with GI consultation.   Assessment & Plan:   Principal Problem:   Hematemesis/vomiting blood Active Problems:   Bipolar I disorder, most recent episode depressed (HCC)   Type 2 diabetes mellitus ([place])   Hypertension associated with diabetes ([place])   Alcohol use   Hyperlipidemia associated with type 2 diabetes mellitus (HCC)   Extravasation of intravenous contrast medium   Hematemesis   Acute esophagitis   Gastritis and gastroduodenitis  Hematemesis/vomiting blood: Treated with IV fluids and IV Protonix.  Clinically stabilizing. Underwent upper GI endoscopy today and found to have -Grade A reflux esophagitis, gastritis, no obvious source of bloody emesis.  Biopsies done. -Suspect alcohol induced gastritis.  Changing to oral Protonix.  Will advance diet to clears and to soft diet. -Discharge home when tolerating diet.  Alcohol use disorder: No evidence of alcohol withdrawal.  Counseled to quit.  Type 2 diabetes: On metformin at home.  On hold.  On sliding scale.  Bipolar 2 disorder: Fairly controlled as per patient.  Currently on Lamictal, Seroquel and hydroxyzine.  Essential hypertension: On losartan 50 mg daily.   DVT prophylaxis: SCDs Start: 01/15/22 1916   Code Status: Full Code  Family Communication: Husband at the bedside. Status is: Inpatient.  Inpatient procedures today.  Challenge with oral diet.  Needs to tolerate diet before discharge.       Estimated  body mass index is 39.44 kg/m as calculated from the following:   Height as of this encounter: 5\' 2"  (1.575 m).   Weight as of this encounter: 97.8 kg.     Consultants:  GI  Procedures:  None  Antimicrobials:  Anti-infectives (From admission, onward)    None         Subjective: Patient seen and examined.  Came back from procedure.  Husband at the bedside.  Mild right flank pain otherwise denies any complaints.  Denies any nausea vomiting.  Objective: Vitals:   01/17/22 1100 01/17/22 1115 01/17/22 1130 01/17/22 1144  BP: (!) 124/59 139/77 (!) 152/87 136/80  Pulse: 93 79 75 86  Resp: 18 17 18 18   Temp: 97.9 F (36.6 C)  (!) 97.3 F (36.3 C) 97.9 F (36.6 C)  TempSrc:    Oral  SpO2: 97% 96% 97% 98%  Weight:      Height:        Intake/Output Summary (Last 24 hours) at 01/17/2022 1410 Last data filed at 01/16/2022 1700 Gross per 24 hour  Intake 460 ml  Output --  Net 460 ml   Filed Weights   01/16/22 1325  Weight: 97.8 kg    Examination:  General: Looks comfortable. Cardiovascular: S1-S2 normal.  Regular rate rhythm. Respiratory: Bilateral clear.  No added sounds. Gastrointestinal: Soft.  Nontender.  Bowel sound present. Ext: No swelling or edema.  No cyanosis. Neuro: Alert and oriented.  No focal deficit. Musculoskeletal: No deformities.   Data Reviewed: I have personally reviewed following labs and imaging studies  CBC: Recent Labs  Lab 01/15/22 0811 01/15/22 1550  01/16/22 0813 01/17/22 0057  WBC 10.8* 10.1 8.9 9.8  NEUTROABS 5.4 6.3  --   --   HGB 14.2 14.4 14.5 14.7  HCT 44.8 45.9 44.8 44.4  MCV 91.4 91.4 91.1 89.0  PLT 261 266 258 315   Basic Metabolic Panel: Recent Labs  Lab 01/15/22 0811 01/16/22 0813  NA 141 138  K 3.8 3.5  CL 108 104  CO2 23 24  GLUCOSE 150* 114*  BUN 7 6  CREATININE 0.94 0.90  CALCIUM 9.0 9.1   GFR: Estimated Creatinine Clearance: 76.2 mL/min (by C-G formula based on SCr of 0.9 mg/dL). Liver Function  Tests: Recent Labs  Lab 01/15/22 1550 01/16/22 0813  AST 59* 57*  ALT 68* 67*  ALKPHOS 73 76  BILITOT 0.4 0.4  PROT 6.9 6.5  ALBUMIN 3.8 3.4*   Recent Labs  Lab 01/15/22 1550  LIPASE 33   No results for input(s): AMMONIA in the last 168 hours. Coagulation Profile: Recent Labs  Lab 01/15/22 1823 01/16/22 0813  INR 1.0 1.0   Cardiac Enzymes: No results for input(s): CKTOTAL, CKMB, CKMBINDEX, TROPONINI in the last 168 hours. BNP (last 3 results) No results for input(s): PROBNP in the last 8760 hours. HbA1C: No results for input(s): HGBA1C in the last 72 hours. CBG: Recent Labs  Lab 01/16/22 1932 01/17/22 0031 01/17/22 0426 01/17/22 0816 01/17/22 1145  GLUCAP 108* 124* 127* 107* 128*   Lipid Profile: No results for input(s): CHOL, HDL, LDLCALC, TRIG, CHOLHDL, LDLDIRECT in the last 72 hours. Thyroid Function Tests: No results for input(s): TSH, T4TOTAL, FREET4, T3FREE, THYROIDAB in the last 72 hours. Anemia Panel: No results for input(s): VITAMINB12, FOLATE, FERRITIN, TIBC, IRON, RETICCTPCT in the last 72 hours. Sepsis Labs: No results for input(s): PROCALCITON, LATICACIDVEN in the last 168 hours.  Recent Results (from the past 240 hour(s))  Resp Panel by RT-PCR (Flu A&B, Covid) Nasopharyngeal Swab     Status: None   Collection Time: 01/15/22  3:50 PM   Specimen: Nasopharyngeal Swab; Nasopharyngeal(NP) swabs in vial transport medium  Result Value Ref Range Status   SARS Coronavirus 2 by RT PCR NEGATIVE NEGATIVE Final    Comment: (NOTE) SARS-CoV-2 target nucleic acids are NOT DETECTED.  The SARS-CoV-2 RNA is generally detectable in upper respiratory specimens during the acute phase of infection. The lowest concentration of SARS-CoV-2 viral copies this assay can detect is 138 copies/mL. A negative result does not preclude SARS-Cov-2 infection and should not be used as the sole basis for treatment or other patient management decisions. A negative result may  occur with  improper specimen collection/handling, submission of specimen other than nasopharyngeal swab, presence of viral mutation(s) within the areas targeted by this assay, and inadequate number of viral copies(<138 copies/mL). A negative result must be combined with clinical observations, patient history, and epidemiological information. The expected result is Negative.  Fact Sheet for Patients:  EntrepreneurPulse.com.au  Fact Sheet for Healthcare Providers:  IncredibleEmployment.be  This test is no t yet approved or cleared by the [place] FDA and  has been authorized for detection and/or diagnosis of SARS-CoV-2 by FDA under an Emergency Use Authorization (EUA). This EUA will remain  in effect (meaning this test can be used) for the duration of the COVID-19 declaration under Section 564(b)(1) of the Act, 21 U.S.C.section 360bbb-3(b)(1), unless the authorization is terminated  or revoked sooner.       Influenza A by PCR NEGATIVE NEGATIVE Final   Influenza B by PCR NEGATIVE NEGATIVE Final  Comment: (NOTE) The Xpert Xpress SARS-CoV-2/FLU/RSV plus assay is intended as an aid in the diagnosis of influenza from Nasopharyngeal swab specimens and should not be used as a sole basis for treatment. Nasal washings and aspirates are unacceptable for Xpert Xpress SARS-CoV-2/FLU/RSV testing.  Fact Sheet for Patients: EntrepreneurPulse.com.au  Fact Sheet for Healthcare Providers: IncredibleEmployment.be  This test is not yet approved or cleared by the [place] FDA and has been authorized for detection and/or diagnosis of SARS-CoV-2 by FDA under an Emergency Use Authorization (EUA). This EUA will remain in effect (meaning this test can be used) for the duration of the COVID-19 declaration under Section 564(b)(1) of the Act, 21 U.S.C. section 360bbb-3(b)(1), unless the authorization is terminated  or revoked.  Performed at [facility], [place] [address] [place], [place] [address]      Radiology Studies: CT Angio Chest/Abd/Pel for Dissection W and/or Wo Contrast  Result Date: 01/15/2022 CLINICAL DATA:  Aortic aneurysm, known or suspected Sudden onset back pain woke her up at 3 AM with multiple episodes of hematemesis. History of frequent alcohol use EXAM: CT ANGIOGRAPHY CHEST, ABDOMEN AND PELVIS TECHNIQUE: Non-contrast CT of the chest was initially obtained. Multidetector CT imaging through the chest, abdomen and pelvis was performed using the standard protocol during bolus administration of intravenous contrast. Multiplanar reconstructed images and MIPs were obtained and reviewed to evaluate the vascular anatomy. RADIATION DOSE REDUCTION: This exam was performed according to the departmental dose-optimization program which includes automated exposure control, adjustment of the mA and/or kV according to patient size and/or use of iterative reconstruction technique. CONTRAST:  21mL OMNIPAQUE IOHEXOL 350 MG/ML SOLN COMPARISON:  Multiple priors FINDINGS: CTA CHEST Cardiovascular: Preferential opacification of the thoracic aorta. No evidence of thoracic aortic aneurysm or dissection. The supra-aortic branch vessels are patent proximally. Normal heart size. No pericardial effusion. Mediastinum/Nodes: No enlarged mediastinal, hilar, or axillary lymph nodes. The thyroid gland appears normal. Lungs/Pleura: No pleural effusion. No pneumothorax. Subsegmental atelectasis in the dependent regions of the lungs. No lobar consolidation. Motion artifacts obscures fine detail of the pulmonary parenchyma. CTA ABDOMEN AND PELVIS VASCULAR Aorta: Normal caliber aorta without aneurysm, dissection, vasculitis or significant stenosis. Celiac: Patent without evidence of aneurysm, dissection, vasculitis or significant stenosis. SMA: Patent without evidence of aneurysm, dissection, vasculitis or significant stenosis.  IMA: Patent. Renals: Both renal arteries are patent without evidence of aneurysm, dissection, vasculitis, fibromuscular dysplasia or significant stenosis. Single renal arteries bilaterally. Inflow: Patent without evidence of aneurysm, dissection, vasculitis or significant stenosis. Veins: No obvious venous abnormality within the limitations of this arterial phase study. NON-VASCULAR Hepatobiliary: The liver is normal in size without focal abnormality. No intrahepatic or extrahepatic biliary ductal dilation. The gallbladder appears normal. Spleen: Normal in size without focal abnormality. Pancreas: No pancreatic ductal dilatation or surrounding inflammatory changes. Adrenals/Urinary Tract: Adrenal glands are unremarkable. Kidneys are normal, without renal calculi, focal lesion, or hydronephrosis. Bladder is unremarkable. Stomach/Bowel: The stomach, small bowel and large bowel are normal in caliber without abnormal wall thickening or surrounding inflammatory changes. The appendix is normal. Reproductive: Uterus and bilateral adnexa are unremarkable. Lymphatic: No enlarged lymph nodes in the abdomen or pelvis. Other: No abdominopelvic ascites. Musculoskeletal: No aggressive osseous lesions. The soft tissues are unremarkable. Review of the MIP images confirms the above findings. IMPRESSION: Vascular: 1. No findings of aortic aneurysm or dissection. No acute vascular findings in the chest, abdomen or pelvis. Nonvascular: 1. No acute findings in the chest, abdomen or pelvis. Electronically Signed   By:  Albin Felling M.D.   On: 01/15/2022 16:33    Scheduled Meds:  atorvastatin  20 mg Oral QPM   gabapentin  100 mg Oral QHS   insulin aspart  0-9 Units Subcutaneous Q4H   lamoTRIgine  100 mg Oral Daily   losartan  50 mg Oral Daily   pantoprazole  40 mg Oral BID   QUEtiapine  200 mg Oral QHS   Continuous Infusions:   LOS: 1 day   Time spent: 35 minutes  Barb Merino, MD [facility]  01/17/2022, 2:10  PM

## 2022-01-18 ENCOUNTER — Other Ambulatory Visit: Payer: Self-pay

## 2022-01-18 ENCOUNTER — Encounter (HOSPITAL_COMMUNITY): Payer: Self-pay | Admitting: Gastroenterology

## 2022-01-18 DIAGNOSIS — K2970 Gastritis, unspecified, without bleeding: Secondary | ICD-10-CM

## 2022-01-18 DIAGNOSIS — B9681 Helicobacter pylori [H. pylori] as the cause of diseases classified elsewhere: Secondary | ICD-10-CM

## 2022-01-18 DIAGNOSIS — F3130 Bipolar disorder, current episode depressed, mild or moderate severity, unspecified: Secondary | ICD-10-CM

## 2022-01-18 LAB — GLUCOSE, CAPILLARY
Glucose-Capillary: 143 mg/dL — ABNORMAL HIGH (ref 70–99)
Glucose-Capillary: 122 mg/dL — ABNORMAL HIGH (ref 70–99)

## 2022-01-18 LAB — SURGICAL PATHOLOGY

## 2022-01-18 MED ORDER — LOSARTAN POTASSIUM 50 MG PO TABS: 50.0000 mg | ORAL_TABLET | Freq: Every day | ORAL | 11 refills | Status: DC

## 2022-01-18 MED ORDER — PANTOPRAZOLE SODIUM 40 MG PO TBEC: 40.0000 mg | DELAYED_RELEASE_TABLET | Freq: Two times a day (BID) | ORAL | 0 refills | Status: DC

## 2022-01-18 MED ORDER — TETRACYCLINE HCL 500 MG PO CAPS: 500.0000 mg | ORAL_CAPSULE | Freq: Four times a day (QID) | ORAL | 0 refills | Status: DC

## 2022-01-18 MED ORDER — GABAPENTIN 100 MG PO CAPS: 100.0000 mg | ORAL_CAPSULE | Freq: Every day | ORAL | 11 refills | Status: DC

## 2022-01-18 MED ORDER — METRONIDAZOLE 500 MG PO TABS: 500.0000 mg | ORAL_TABLET | Freq: Two times a day (BID) | ORAL | 0 refills | Status: DC

## 2022-01-18 MED ORDER — BISMUTH SUBSALICYLATE 262 MG PO CHEW: 262.0000 mg | CHEWABLE_TABLET | Freq: Four times a day (QID) | ORAL | 0 refills | Status: AC

## 2022-01-18 NOTE — Discharge Summary (Signed)
Physician Discharge Summary  Cheyenne Fowler IWL:[id number] DOB: [birth year] DOA: 01/15/2022  PCP: Mack Hook, MD  Admit date: 01/15/2022 Discharge date: 01/18/2022  Admitted From: Home Disposition: Home  Recommendations for Outpatient Follow-up:  Follow up with PCP in 1-2 weeks You have a scheduled follow-up with GI for colonoscopy, they will also call you with biopsy results.  Home Health: N/A Equipment/Devices: N/A  Discharge Condition: Stable CODE STATUS: Full code Diet recommendation: Low-salt diet.  Discharge summary: Omni EDINA WINNINGHAM is a 56 y.o. female with medical history significant for bipolar disorder, T2DM, HTN, HLD, chronic low back pain, substance use disorder (alcohol, cocaine, tobacco) who presented to the ED for evaluation of bloody emesis.  No other obvious bleeding.  In the emergency room hemodynamically stable.  Started on IV fluids, IV Protonix and admitted to the hospital with GI consultation.  Hematemesis/vomiting blood: Treated with IV fluids and IV Protonix.  Clinically improved. Underwent upper GI endoscopy 1/26 ,found to have -Grade A reflux esophagitis, gastritis, no obvious source of bloody emesis.  Biopsies done. -Suspect alcohol induced gastritis.  -Going home with Protonix 40 mg twice daily for 8 weeks.  -Currently tolerating diet.  -Scheduled for GI follow-up along with colonoscopy on 2/16 that she will keep up.     Alcohol use disorder: No evidence of alcohol withdrawal.  Counseled to quit.   Type 2 diabetes: On metformin at home.  Resume on discharge.   Bipolar 2 disorder: Fairly controlled as per patient.  Currently on Lamictal, Seroquel and hydroxyzine.   Essential hypertension: On losartan 50 mg daily.  Stable.   Patient with controlled symptoms today.  Tolerating diet.  Able to go home.  She is aware about pending biopsy results and also GI follow-up.    Discharge Diagnoses:  Principal Problem:   Hematemesis/vomiting  blood Active Problems:   Bipolar I disorder, most recent episode depressed (HCC)   Type 2 diabetes mellitus ([place])   Hypertension associated with diabetes ([place])   Alcohol use   Hyperlipidemia associated with type 2 diabetes mellitus (HCC)   Extravasation of intravenous contrast medium   Hematemesis   Acute esophagitis   Gastritis and gastroduodenitis    Discharge Instructions  Discharge Instructions     Call MD for:  persistant nausea and vomiting   Complete by: As directed    Call MD for:  severe uncontrolled pain   Complete by: As directed    Diet - low sodium heart healthy   Complete by: As directed    Diet Carb Modified   Complete by: As directed    Increase activity slowly   Complete by: As directed       Allergies as of 01/18/2022       Reactions   Penicillins    REACTION: hives   Sulfonamide Derivatives    REACTION: hives        Medication List     STOP taking these medications    atorvastatin 20 MG tablet Commonly known as: LIPITOR   Vitamin B Complex Tabs       TAKE these medications    cetirizine 10 MG tablet Commonly known as: ZYRTEC Take 10 mg by mouth daily.   gabapentin 100 MG capsule Commonly known as: NEURONTIN Take 1 capsule (100 mg total) by mouth at bedtime.   hydrOXYzine 50 MG capsule Commonly known as: VISTARIL Take 1 capsule (50 mg total) by mouth 3 (three) times daily as needed. What changed: reasons to take this   lamoTRIgine  100 MG tablet Commonly known as: LaMICtal Take 1 tablet (100 mg total) by mouth daily.   losartan 50 MG tablet Commonly known as: COZAAR Take 1 tablet (50 mg total) by mouth daily.   metFORMIN 500 MG 24 hr tablet Commonly known as: GLUCOPHAGE-XR 1 tab by mouth twice daily with meals   pantoprazole 40 MG tablet Commonly known as: PROTONIX Take 1 tablet (40 mg total) by mouth 2 (two) times daily.   QUEtiapine 200 MG tablet Commonly known as: SEROQUEL Take 1 tablet (200 mg total) by mouth  at bedtime.        Follow-up Information     Mack Hook, MD Follow up in 1 week(s).   Specialty: Internal Medicine Contact information: [place] [address] [phone]                Allergies  Allergen Reactions   Penicillins     REACTION: hives   Sulfonamide Derivatives     REACTION: hives    Consultations: Gastroenterology   Procedures/Studies: CT Angio Chest/Abd/Pel for Dissection W and/or Wo Contrast  Result Date: 01/15/2022 CLINICAL DATA:  Aortic aneurysm, known or suspected Sudden onset back pain woke her up at 3 AM with multiple episodes of hematemesis. History of frequent alcohol use EXAM: CT ANGIOGRAPHY CHEST, ABDOMEN AND PELVIS TECHNIQUE: Non-contrast CT of the chest was initially obtained. Multidetector CT imaging through the chest, abdomen and pelvis was performed using the standard protocol during bolus administration of intravenous contrast. Multiplanar reconstructed images and MIPs were obtained and reviewed to evaluate the vascular anatomy. RADIATION DOSE REDUCTION: This exam was performed according to the departmental dose-optimization program which includes automated exposure control, adjustment of the mA and/or kV according to patient size and/or use of iterative reconstruction technique. CONTRAST:  10mL OMNIPAQUE IOHEXOL 350 MG/ML SOLN COMPARISON:  Multiple priors FINDINGS: CTA CHEST Cardiovascular: Preferential opacification of the thoracic aorta. No evidence of thoracic aortic aneurysm or dissection. The supra-aortic branch vessels are patent proximally. Normal heart size. No pericardial effusion. Mediastinum/Nodes: No enlarged mediastinal, hilar, or axillary lymph nodes. The thyroid gland appears normal. Lungs/Pleura: No pleural effusion. No pneumothorax. Subsegmental atelectasis in the dependent regions of the lungs. No lobar consolidation. Motion artifacts obscures fine detail of the pulmonary parenchyma. CTA ABDOMEN AND  PELVIS VASCULAR Aorta: Normal caliber aorta without aneurysm, dissection, vasculitis or significant stenosis. Celiac: Patent without evidence of aneurysm, dissection, vasculitis or significant stenosis. SMA: Patent without evidence of aneurysm, dissection, vasculitis or significant stenosis. IMA: Patent. Renals: Both renal arteries are patent without evidence of aneurysm, dissection, vasculitis, fibromuscular dysplasia or significant stenosis. Single renal arteries bilaterally. Inflow: Patent without evidence of aneurysm, dissection, vasculitis or significant stenosis. Veins: No obvious venous abnormality within the limitations of this arterial phase study. NON-VASCULAR Hepatobiliary: The liver is normal in size without focal abnormality. No intrahepatic or extrahepatic biliary ductal dilation. The gallbladder appears normal. Spleen: Normal in size without focal abnormality. Pancreas: No pancreatic ductal dilatation or surrounding inflammatory changes. Adrenals/Urinary Tract: Adrenal glands are unremarkable. Kidneys are normal, without renal calculi, focal lesion, or hydronephrosis. Bladder is unremarkable. Stomach/Bowel: The stomach, small bowel and large bowel are normal in caliber without abnormal wall thickening or surrounding inflammatory changes. The appendix is normal. Reproductive: Uterus and bilateral adnexa are unremarkable. Lymphatic: No enlarged lymph nodes in the abdomen or pelvis. Other: No abdominopelvic ascites. Musculoskeletal: No aggressive osseous lesions. The soft tissues are unremarkable. Review of the MIP images confirms the above findings. IMPRESSION: Vascular: 1.  No findings of aortic aneurysm or dissection. No acute vascular findings in the chest, abdomen or pelvis. Nonvascular: 1. No acute findings in the chest, abdomen or pelvis. Electronically Signed   By: Albin Felling M.D.   On: 01/15/2022 16:33   (Echo, Carotid, EGD, Colonoscopy, ERCP)    Subjective: Patient seen and examined.   Denies any complaints.  Mild back pain persist but no abdominal pain nausea vomiting.  Tolerating regular diet.  Eager to go home.   Discharge Exam: Vitals:   01/18/22 0428 01/18/22 0739  BP: 123/78 117/82  Pulse: 95 (!) 106  Resp:  18  Temp: 98.3 F (36.8 C) 98.2 F (36.8 C)  SpO2: 96% 96%   Vitals:   01/17/22 1952 01/17/22 2331 01/18/22 0428 01/18/22 0739  BP: 132/81 121/68 123/78 117/82  Pulse: 85 (!) 105 95 (!) 106  Resp:  17  18  Temp: 98.4 F (36.9 C) 99.3 F (37.4 C) 98.3 F (36.8 C) 98.2 F (36.8 C)  TempSrc: Oral Oral Oral Oral  SpO2: 96% 94% 96% 96%  Weight:      Height:        General: Pt is alert, awake, not in acute distress Cardiovascular: RRR, S1/S2 +, no rubs, no gallops Respiratory: CTA bilaterally, no wheezing, no rhonchi Abdominal: Soft, NT, ND, bowel sounds + Extremities: no edema, no cyanosis    The results of significant diagnostics from this hospitalization (including imaging, microbiology, ancillary and laboratory) are listed below for reference.     Microbiology: Recent Results (from the past 240 hour(s))  Resp Panel by RT-PCR (Flu A&B, Covid) Nasopharyngeal Swab     Status: None   Collection Time: 01/15/22  3:50 PM   Specimen: Nasopharyngeal Swab; Nasopharyngeal(NP) swabs in vial transport medium  Result Value Ref Range Status   SARS Coronavirus 2 by RT PCR NEGATIVE NEGATIVE Final    Comment: (NOTE) SARS-CoV-2 target nucleic acids are NOT DETECTED.  The SARS-CoV-2 RNA is generally detectable in upper respiratory specimens during the acute phase of infection. The lowest concentration of SARS-CoV-2 viral copies this assay can detect is 138 copies/mL. A negative result does not preclude SARS-Cov-2 infection and should not be used as the sole basis for treatment or other patient management decisions. A negative result may occur with  improper specimen collection/handling, submission of specimen other than nasopharyngeal swab, presence of  viral mutation(s) within the areas targeted by this assay, and inadequate number of viral copies(<138 copies/mL). A negative result must be combined with clinical observations, patient history, and epidemiological information. The expected result is Negative.  Fact Sheet for Patients:  EntrepreneurPulse.com.au  Fact Sheet for Healthcare Providers:  IncredibleEmployment.be  This test is no t yet approved or cleared by the [place] FDA and  has been authorized for detection and/or diagnosis of SARS-CoV-2 by FDA under an Emergency Use Authorization (EUA). This EUA will remain  in effect (meaning this test can be used) for the duration of the COVID-19 declaration under Section 564(b)(1) of the Act, 21 U.S.C.section 360bbb-3(b)(1), unless the authorization is terminated  or revoked sooner.       Influenza A by PCR NEGATIVE NEGATIVE Final   Influenza B by PCR NEGATIVE NEGATIVE Final    Comment: (NOTE) The Xpert Xpress SARS-CoV-2/FLU/RSV plus assay is intended as an aid in the diagnosis of influenza from Nasopharyngeal swab specimens and should not be used as a sole basis for treatment. Nasal washings and aspirates are unacceptable for Xpert Xpress SARS-CoV-2/FLU/RSV testing.  Fact  Sheet for Patients: EntrepreneurPulse.com.au  Fact Sheet for Healthcare Providers: IncredibleEmployment.be  This test is not yet approved or cleared by the [place] FDA and has been authorized for detection and/or diagnosis of SARS-CoV-2 by FDA under an Emergency Use Authorization (EUA). This EUA will remain in effect (meaning this test can be used) for the duration of the COVID-19 declaration under Section 564(b)(1) of the Act, 21 U.S.C. section 360bbb-3(b)(1), unless the authorization is terminated or revoked.  Performed at [facility], [place] [address], [place], [place] [address]      Labs: BNP (last 3  results) No results for input(s): BNP in the last 8760 hours. Basic Metabolic Panel: Recent Labs  Lab 01/15/22 0811 01/16/22 0813  NA 141 138  K 3.8 3.5  CL 108 104  CO2 23 24  GLUCOSE 150* 114*  BUN 7 6  CREATININE 0.94 0.90  CALCIUM 9.0 9.1   Liver Function Tests: Recent Labs  Lab 01/15/22 1550 01/16/22 0813  AST 59* 57*  ALT 68* 67*  ALKPHOS 73 76  BILITOT 0.4 0.4  PROT 6.9 6.5  ALBUMIN 3.8 3.4*   Recent Labs  Lab 01/15/22 1550  LIPASE 33   No results for input(s): AMMONIA in the last 168 hours. CBC: Recent Labs  Lab 01/15/22 0811 01/15/22 1550 01/16/22 0813 01/17/22 0057  WBC 10.8* 10.1 8.9 9.8  NEUTROABS 5.4 6.3  --   --   HGB 14.2 14.4 14.5 14.7  HCT 44.8 45.9 44.8 44.4  MCV 91.4 91.4 91.1 89.0  PLT 261 266 258 311   Cardiac Enzymes: No results for input(s): CKTOTAL, CKMB, CKMBINDEX, TROPONINI in the last 168 hours. BNP: Invalid input(s): POCBNP CBG: Recent Labs  Lab 01/17/22 1635 01/17/22 2015 01/17/22 2334 01/18/22 0431 01/18/22 0744  GLUCAP 106* 132* 117* 143* 122*   D-Dimer No results for input(s): DDIMER in the last 72 hours. Hgb A1c No results for input(s): HGBA1C in the last 72 hours. Lipid Profile No results for input(s): CHOL, HDL, LDLCALC, TRIG, CHOLHDL, LDLDIRECT in the last 72 hours. Thyroid function studies No results for input(s): TSH, T4TOTAL, T3FREE, THYROIDAB in the last 72 hours.  Invalid input(s): FREET3 Anemia work up No results for input(s): VITAMINB12, FOLATE, FERRITIN, TIBC, IRON, RETICCTPCT in the last 72 hours. Urinalysis    Component Value Date/Time   COLORURINE YELLOW 01/15/2022 1756   APPEARANCEUR HAZY (A) 01/15/2022 1756   LABSPEC 1.018 01/15/2022 1756   PHURINE 5.0 01/15/2022 1756   GLUCOSEU NEGATIVE 01/15/2022 1756   HGBUR NEGATIVE 01/15/2022 1756   BILIRUBINUR NEGATIVE 01/15/2022 1756   KETONESUR NEGATIVE 01/15/2022 1756   PROTEINUR NEGATIVE 01/15/2022 1756   UROBILINOGEN 1.0 07/12/2012 2150    NITRITE NEGATIVE 01/15/2022 1756   LEUKOCYTESUR NEGATIVE 01/15/2022 1756   Sepsis Labs Invalid input(s): PROCALCITONIN,  WBC,  LACTICIDVEN Microbiology Recent Results (from the past 240 hour(s))  Resp Panel by RT-PCR (Flu A&B, Covid) Nasopharyngeal Swab     Status: None   Collection Time: 01/15/22  3:50 PM   Specimen: Nasopharyngeal Swab; Nasopharyngeal(NP) swabs in vial transport medium  Result Value Ref Range Status   SARS Coronavirus 2 by RT PCR NEGATIVE NEGATIVE Final    Comment: (NOTE) SARS-CoV-2 target nucleic acids are NOT DETECTED.  The SARS-CoV-2 RNA is generally detectable in upper respiratory specimens during the acute phase of infection. The lowest concentration of SARS-CoV-2 viral copies this assay can detect is 138 copies/mL. A negative result does not preclude SARS-Cov-2 infection and should not be  used as the sole basis for treatment or other patient management decisions. A negative result may occur with  improper specimen collection/handling, submission of specimen other than nasopharyngeal swab, presence of viral mutation(s) within the areas targeted by this assay, and inadequate number of viral copies(<138 copies/mL). A negative result must be combined with clinical observations, patient history, and epidemiological information. The expected result is Negative.  Fact Sheet for Patients:  EntrepreneurPulse.com.au  Fact Sheet for Healthcare Providers:  IncredibleEmployment.be  This test is no t yet approved or cleared by the [place] FDA and  has been authorized for detection and/or diagnosis of SARS-CoV-2 by FDA under an Emergency Use Authorization (EUA). This EUA will remain  in effect (meaning this test can be used) for the duration of the COVID-19 declaration under Section 564(b)(1) of the Act, 21 U.S.C.section 360bbb-3(b)(1), unless the authorization is terminated  or revoked sooner.       Influenza A by PCR  NEGATIVE NEGATIVE Final   Influenza B by PCR NEGATIVE NEGATIVE Final    Comment: (NOTE) The Xpert Xpress SARS-CoV-2/FLU/RSV plus assay is intended as an aid in the diagnosis of influenza from Nasopharyngeal swab specimens and should not be used as a sole basis for treatment. Nasal washings and aspirates are unacceptable for Xpert Xpress SARS-CoV-2/FLU/RSV testing.  Fact Sheet for Patients: EntrepreneurPulse.com.au  Fact Sheet for Healthcare Providers: IncredibleEmployment.be  This test is not yet approved or cleared by the [place] FDA and has been authorized for detection and/or diagnosis of SARS-CoV-2 by FDA under an Emergency Use Authorization (EUA). This EUA will remain in effect (meaning this test can be used) for the duration of the COVID-19 declaration under Section 564(b)(1) of the Act, 21 U.S.C. section 360bbb-3(b)(1), unless the authorization is terminated or revoked.  Performed at [facility], [place] [address] [place], [place] [address]      Time coordinating discharge: 35 minutes  SIGNED:   Barb Merino, MD  [facility] 01/18/2022, 10:27 AM

## 2022-01-18 NOTE — Progress Notes (Signed)
° ° ° °  [facility] Progress Note  CC:  CGE  Subjective:  Feeling much better.  Still has her regular arthritic back pain but none of the other pain that she was experiencing.  No nausea or vomiting.  Tolerated breakfast.  Objective:  Vital signs in last 24 hours: Temp:  [97.3 F (36.3 C)-99.3 F (37.4 C)] 98.2 F (36.8 C) (01/27 0739) Pulse Rate:  [75-106] 106 (01/27 0739) Resp:  [17-18] 18 (01/27 0739) BP: (117-152)/(59-87) 117/82 (01/27 0739) SpO2:  [94 %-100 %] 96 % (01/27 0739) Last BM Date: 01/16/22 General:  Alert, Well-developed, in NAD Heart:  Regular rate and rhythm; no murmurs Pulm:  CTAB.  No W/R/R. Abdomen:  Soft, non-distended.  BS present.  Non-tender. Extremities:  Without edema. Neurologic:  Alert and oriented x 4;  grossly normal neurologically. Psych:  Alert and cooperative. Normal mood and affect.  Intake/Output from previous day: 01/26 0701 - 01/27 0700 In: 460 [P.O.:460] Out: -   Lab Results: Recent Labs    01/15/22 1550 01/16/22 0813 01/17/22 0057  WBC 10.1 8.9 9.8  HGB 14.4 14.5 14.7  HCT 45.9 44.8 44.4  PLT 266 258 311   BMET Recent Labs    01/16/22 0813  NA 138  K 3.5  CL 104  CO2 24  GLUCOSE 114*  BUN 6  CREATININE 0.90  CALCIUM 9.1   LFT Recent Labs    01/15/22 1550 01/16/22 0813  PROT 6.9 6.5  ALBUMIN 3.8 3.4*  AST 59* 57*  ALT 68* 67*  ALKPHOS 73 76  BILITOT 0.4 0.4  BILIDIR <0.1  --   IBILI NOT CALCULATED  --    PT/INR Recent Labs    01/15/22 1823 01/16/22 0813  LABPROT 13.1 12.7  INR 1.0 1.0   Assessment / Plan: Dark emesis associated with spine pain.  Labs and imaging unremarkable.  Not anemic.  No chronic GI symptoms.  FOBT negative.    - LA Grade A reflux esophagitis with no bleeding. Biopsied. - Gastritis. Biopsied. - Normal examined duodenum. Biopsied. - The examination was otherwise normal. - No obvious source for bloody emesis identified on this study.  Biopsies pending. Continue  pantoprazole 40 mg BID for 8 weeks then once daily.   Fatty liver, obesity.  Mild transaminase elevation.  No gallbladder, biliary tree abnormalities on imaging.    Lumbar spondylosis, deg disc dz with chronic low back pain.  Suspect this is the source of her pain complaints.  -She is scheduled for colonoscopy with our office on 2/16. -She is being discharged today, which we agree with.   LOS: 2 days   Cheyenne Fowler  01/18/2022, 10:21 AM

## 2022-01-18 NOTE — Progress Notes (Signed)
Patient discharged to home with instructions. 

## 2022-01-19 ENCOUNTER — Telehealth: Payer: Self-pay | Admitting: Gastroenterology

## 2022-01-19 NOTE — Telephone Encounter (Signed)
Patient called saying that she needs a PA for some of the medications that were prescribed for her Hpylori.  She has the pantoprazole so I told her to take this and our staff will reach out to her and her pharmacy to take care of the other medications when they return to the office on Monday.  Ammie, can you please look into this?

## 2022-01-20 NOTE — Progress Notes (Signed)
Cheyenne Fowler Cheyenne Fowler - 56 y.o. female MRN [id number]  Date of birth: Mar 16, 1966  Office Visit Note: Visit Date: 12/13/2021 PCP: Mack Hook, MD Referred by: Lorine Bears, NP  Subjective: Chief Complaint  Patient presents with   Lower Back - Pain   Right Leg - Pain   Right Foot - Pain   HPI:  Cheyenne Fowler is a 56 y.o. female who comes in today at the request of Barnet Pall, FNP for planned Bilateral  L4-5 and L5-S1 Lumbar facet/medial branch block with fluoroscopic guidance.  The patient has failed conservative care including home exercise, medications, time and activity modification.  This injection will be diagnostic and hopefully therapeutic.  Please see requesting physician notes for further details and justification.  Exam has shown concordant pain with facet joint loading.   ROS Otherwise per HPI.  Assessment & Plan: Visit Diagnoses:    ICD-10-CM   1. Spondylosis without myelopathy or radiculopathy, lumbar region  M47.816 XR C-ARM NO REPORT    Facet Injection    DISCONTINUED: bupivacaine (MARCAINE) 0.5 % (with pres) injection 3 mL    2. Chronic bilateral low back pain without sciatica  M54.50 XR C-ARM NO REPORT   G89.29 Facet Injection    DISCONTINUED: bupivacaine (MARCAINE) 0.5 % (with pres) injection 3 mL      Plan: No additional findings.   Meds & Orders:  Meds ordered this encounter  Medications   DISCONTD: bupivacaine (MARCAINE) 0.5 % (with pres) injection 3 mL    Orders Placed This Encounter  Procedures   Facet Injection   XR C-ARM NO REPORT    Follow-up: Return for Review Pain Diary.   Procedures: No procedures performed  Lumbar Diagnostic Facet Joint Nerve Block with Fluoroscopic Guidance   Patient: Cheyenne Fowler      Date of Birth: 05-20-1966 MRN: [id number] PCP: Mack Hook, MD      Visit Date:    Universal Protocol:    Date/Time: 01/20/2310:45 AM  Consent Given By: the patient  Position: PRONE  Additional  Comments: Vital signs were monitored before and after the procedure. Patient was prepped and draped in the usual sterile fashion. The correct patient, procedure, and site was verified.   Injection Procedure Details:   Procedure diagnoses: Spondylosis without myelopathy or radiculopathy, lumbar region  (primary encounter diagnosis) Chronic bilateral low back pain without sciatica   Meds Administered: Orders Placed This Encounter     DISCONTD: bupivacaine (MARCAINE) 0.5 % (with pres) injection 3 mL    Laterality: Bilateral  Location/Site: L4-L5, L3 and L4 medial branches and L5-S1, L4 medial branch and L5 dorsal ramus  Needle: 5.0 in., 25 ga.  Short bevel or Quincke spinal needle  Needle Placement: Oblique pedical  Findings:   -Comments: There was excellent flow of contrast along the articular pillars without intravascular flow.  Procedure Details: The fluoroscope beam is vertically oriented in AP and then obliqued 15 to 20 degrees to the ipsilateral side of the desired nerve to achieve the "Scotty dog" appearance.  The skin over the target area of the junction of the superior articulating process and the transverse process (sacral ala if blocking the L5 dorsal rami) was locally anesthetized with a 1 ml volume of 1% Lidocaine without Epinephrine.  The spinal needle was inserted and advanced in a trajectory view down to the target.   After contact with periosteum and negative aspirate for blood and CSF, correct placement without intravascular or epidural spread was confirmed by injecting  0.5 ml. of Isovue-250.  A spot radiograph was obtained of this image.    Next, a 0.5 ml. volume of the injectate described above was injected. The needle was then redirected to the other facet joint nerves mentioned above if needed.  Prior to the procedure, the patient was given a Pain Diary which was completed for baseline measurements.  After the procedure, the patient rated their pain every 30  minutes and will continue rating at this frequency for a total of 5 hours.  The patient has been asked to complete the Diary and return to [place] by mail, fax or hand delivered as soon as possible.   Additional Comments:  The patient tolerated the procedure well Dressing: 2 x 2 sterile gauze and Band-Aid    Post-procedure details: Patient was observed during the procedure. Post-procedure instructions were reviewed.  Patient left the clinic in stable condition.     Clinical History: MRI LUMBAR SPINE WITHOUT CONTRAST   TECHNIQUE: Multiplanar, multisequence MR imaging of the lumbar spine was performed. No intravenous contrast was administered.   COMPARISON:  X-ray lumbar 08/06/2021.   FINDINGS: Segmentation:  Standard.   Alignment: Minimal grade 1 anterolisthesis of L4 on L5. Minimal grade 1 anterolisthesis of L5 on S1.   Vertebrae: No acute fracture, evidence of discitis, or aggressive bone lesion.   Conus medullaris and cauda equina: Conus extends to the L2 level. Conus and cauda equina appear normal.   Paraspinal and other soft tissues: No acute paraspinal abnormality.   Disc levels:   Disc spaces: Degenerative disease with disc height loss at T10-11, T11-12 and L1-2.   T10-11 and T11-12: Mild broad-based disc bulge. No foraminal or central canal stenosis.   T12-L1: No disc protrusion, foraminal stenosis or central canal stenosis.   L1-L2: Broad-based disc bulge with a small central disc protrusion. No foraminal or central canal stenosis.   L2-L3: No significant disc bulge. No neural foraminal stenosis. No central canal stenosis. Mild bilateral facet arthropathy.   L3-L4: No significant disc bulge. No neural foraminal stenosis. No central canal stenosis. Mild bilateral facet arthropathy with bilateral facet effusions.   L4-L5: Mild broad-based disc bulge. Severe bilateral facet arthropathy. No foraminal or central canal stenosis.   L5-S1: No disc protrusion.  Severe bilateral facet arthropathy. No foraminal or central canal stenosis.   IMPRESSION: 1. Lumbar spine spondylosis as described above. 2. No acute osseous injury of the lumbar spine.     Electronically Signed   By: Kathreen Devoid M.D.   On: 10/26/2021 08:32     Objective:  VS:  HT:     WT:    BMI:      BP:108/69   HR:92bpm   TEMP: ( )   RESP:  Physical Exam Vitals and nursing note reviewed.  Constitutional:      General: She is not in acute distress.    Appearance: Normal appearance. She is not ill-appearing.  HENT:     Head: Normocephalic and atraumatic.     Right Ear: External ear normal.     Left Ear: External ear normal.  Eyes:     Extraocular Movements: Extraocular movements intact.  Cardiovascular:     Rate and Rhythm: Normal rate.     Pulses: Normal pulses.  Pulmonary:     Effort: Pulmonary effort is normal. No respiratory distress.  Abdominal:     General: There is no distension.     Palpations: Abdomen is soft.  Musculoskeletal:  General: Tenderness present.     Cervical back: Neck supple.     Right lower leg: No edema.     Left lower leg: No edema.     Comments: Patient has good distal strength with no pain over the greater trochanters.  No clonus or focal weakness. Patient somewhat slow to rise from a seated position to full extension.  There is concordant low back pain with facet loading and lumbar spine extension rotation.  There are no definitive trigger points but the patient is somewhat tender across the lower back and PSIS.  There is no pain with hip rotation.   Skin:    Findings: No erythema, lesion or rash.  Neurological:     General: No focal deficit present.     Mental Status: She is alert and oriented to person, place, and time.     Sensory: No sensory deficit.     Motor: No weakness or abnormal muscle tone.     Coordination: Coordination normal.  Psychiatric:        Mood and Affect: Mood normal.        Behavior: Behavior normal.      Imaging: No results found.

## 2022-01-20 NOTE — Procedures (Signed)
Lumbar Diagnostic Facet Joint Nerve Block with Fluoroscopic Guidance   Patient: Cheyenne Fowler      Date of Birth: 05-May-1966 MRN: [id number] PCP: Mack Hook, MD      Visit Date:    Universal Protocol:    Date/Time: 01/20/2310:45 AM  Consent Given By: the patient  Position: PRONE  Additional Comments: Vital signs were monitored before and after the procedure. Patient was prepped and draped in the usual sterile fashion. The correct patient, procedure, and site was verified.   Injection Procedure Details:   Procedure diagnoses: Spondylosis without myelopathy or radiculopathy, lumbar region  (primary encounter diagnosis) Chronic bilateral low back pain without sciatica   Meds Administered: Orders Placed This Encounter     DISCONTD: bupivacaine (MARCAINE) 0.5 % (with pres) injection 3 mL    Laterality: Bilateral  Location/Site: L4-L5, L3 and L4 medial branches and L5-S1, L4 medial branch and L5 dorsal ramus  Needle: 5.0 in., 25 ga.  Short bevel or Quincke spinal needle  Needle Placement: Oblique pedical  Findings:   -Comments: There was excellent flow of contrast along the articular pillars without intravascular flow.  Procedure Details: The fluoroscope beam is vertically oriented in AP and then obliqued 15 to 20 degrees to the ipsilateral side of the desired nerve to achieve the "Scotty dog" appearance.  The skin over the target area of the junction of the superior articulating process and the transverse process (sacral ala if blocking the L5 dorsal rami) was locally anesthetized with a 1 ml volume of 1% Lidocaine without Epinephrine.  The spinal needle was inserted and advanced in a trajectory view down to the target.   After contact with periosteum and negative aspirate for blood and CSF, correct placement without intravascular or epidural spread was confirmed by injecting 0.5 ml. of Isovue-250.  A spot radiograph was obtained of this image.    Next, a 0.5 ml.  volume of the injectate described above was injected. The needle was then redirected to the other facet joint nerves mentioned above if needed.  Prior to the procedure, the patient was given a Pain Diary which was completed for baseline measurements.  After the procedure, the patient rated their pain every 30 minutes and will continue rating at this frequency for a total of 5 hours.  The patient has been asked to complete the Diary and return to [place] by mail, fax or hand delivered as soon as possible.   Additional Comments:  The patient tolerated the procedure well Dressing: 2 x 2 sterile gauze and Band-Aid    Post-procedure details: Patient was observed during the procedure. Post-procedure instructions were reviewed.  Patient left the clinic in stable condition.

## 2022-01-22 ENCOUNTER — Other Ambulatory Visit: Payer: Self-pay

## 2022-01-22 MED ORDER — METRONIDAZOLE 500 MG PO TABS: 500.0000 mg | ORAL_TABLET | Freq: Three times a day (TID) | ORAL | 0 refills | Status: DC

## 2022-01-22 MED ORDER — CLARITHROMYCIN 500 MG PO TABS: 500.0000 mg | ORAL_TABLET | Freq: Two times a day (BID) | ORAL | 0 refills | Status: DC

## 2022-01-22 NOTE — Telephone Encounter (Signed)
Order sent to patient's pharmacy for Clarithromycin and Metronidazole to treat H-Pylori. Gave instructions for taking along with the Pantoprazole that she already has. Asked her to abstain from alcohol while taking these medications. Order in [facility] for stool test on or after 03/29/22 and staff reminder to call patient. Office visit scheduled with Dr. Tarri Glenn on 02/14/22 and patient agrees.

## 2022-01-23 ENCOUNTER — Other Ambulatory Visit: Payer: Self-pay | Admitting: Internal Medicine

## 2022-01-23 ENCOUNTER — Encounter: Payer: Self-pay | Admitting: Internal Medicine

## 2022-01-23 ENCOUNTER — Other Ambulatory Visit: Payer: Self-pay

## 2022-01-23 ENCOUNTER — Ambulatory Visit (INDEPENDENT_AMBULATORY_CARE_PROVIDER_SITE_OTHER): Payer: Medicaid Other | Admitting: Internal Medicine

## 2022-01-23 VITALS — BP 140/88 | HR 92 | Resp 12 | Ht 62.25 in | Wt 211.0 lb

## 2022-01-23 DIAGNOSIS — F1090 Alcohol use, unspecified, uncomplicated: Secondary | ICD-10-CM

## 2022-01-23 DIAGNOSIS — K2970 Gastritis, unspecified, without bleeding: Secondary | ICD-10-CM | POA: Diagnosis not present

## 2022-01-23 DIAGNOSIS — Z124 Encounter for screening for malignant neoplasm of cervix: Secondary | ICD-10-CM

## 2022-01-23 DIAGNOSIS — Z789 Other specified health status: Secondary | ICD-10-CM

## 2022-01-23 DIAGNOSIS — I10 Essential (primary) hypertension: Secondary | ICD-10-CM | POA: Diagnosis not present

## 2022-01-23 DIAGNOSIS — B9681 Helicobacter pylori [H. pylori] as the cause of diseases classified elsewhere: Secondary | ICD-10-CM

## 2022-01-23 DIAGNOSIS — Z23 Encounter for immunization: Secondary | ICD-10-CM | POA: Diagnosis not present

## 2022-01-23 DIAGNOSIS — E119 Type 2 diabetes mellitus without complications: Secondary | ICD-10-CM | POA: Diagnosis not present

## 2022-01-23 DIAGNOSIS — Z0000 Encounter for general adult medical examination without abnormal findings: Secondary | ICD-10-CM | POA: Diagnosis not present

## 2022-01-23 NOTE — Progress Notes (Signed)
Subjective:    Patient ID: Cheyenne Fowler, female   DOB: August 14, 1966, 56 y.o.   MRN: [id number]   HPI  CPE with pap  1.  Pap:  Last performed 02/15/2016 and pap itself was normal.  Reportedly always normal.    2.  Mammogram:  Last performed 09/25/2021 and normal.  Maternal aunt died of breast cancer in her late 33s.    3.  Osteoprevention:  Not quite postmenopausal--still with very light flow every 3 months.  Does not eat or drink dairy.  Takes Vitamin D, but not sure what dosage is.  She does like milk, though, develops abdominal bloating and loose stools.  Would be willing to try 3-4 cups of Lactaid milk daily.  Does a lot of cleaning and is physically active with that.  Also walks in grocery store regularly.  Has back issues as well and is receiving injections for that.  Followed by Dr. Sharol Given and is in rehab through his office.  ?last injection with Dr. Ernestina Patches.  4.  Guaiac Cards:  Has not performed in some time--last in 2019 with another provider and + for blood.  5.  Colonoscopy:  Plans to be done on January 29, 2022 (next week)  Had an UGI bleed on 01/15/2022 with findings of gastritis and esophagitis on EGD and getting started on treatment for H.Pylori, which returned positive with Dr. Tarri Glenn from [place].    6.  Immunizations:  Has not had bivalent COVID vaccine or any booster.  Has not had Shingles vaccine.    7.  Glucose/Cholesterol:  A1C elevated at 6.6% in December.  New onset DM then and on Metformin.  Since last seen in December, she has cut out sugar significantly from diet, has also cut out a lot of starch and has lost 10 lbs.  She did require insulin with recent hospitalization for UGI bleed.   Cholesterol was also not at goal for a new diabetic, but again, before she made lifestyle changes.   Lipid Panel     Component Value Date/Time   CHOL 211 (H) 11/22/2021 1242   TRIG 151 (H) 11/22/2021 1242   TRIG 125 04/04/2016 1048   HDL 68 11/22/2021 1242   CHOLHDL 4.3  07/03/2018 1208   VLDL 31 07/03/2018 1208   LDLCALC 117 (H) 11/22/2021 1242   LABVLDL 26 11/22/2021 1242     Current Meds  Medication Sig   bismuth subsalicylate (PEPTO-BISMOL) 262 MG chewable tablet Chew 1 tablet (262 mg total) by mouth 4 (four) times daily for 14 days. (Patient taking differently: Chew 262 mg by mouth 4 (four) times daily. As needed)   cetirizine (ZYRTEC) 10 MG tablet Take 10 mg by mouth daily.   gabapentin (NEURONTIN) 100 MG capsule Take 1 capsule (100 mg total) by mouth at bedtime.   hydrOXYzine (VISTARIL) 50 MG capsule Take 1 capsule (50 mg total) by mouth 3 (three) times daily as needed. (Patient taking differently: Take 50 mg by mouth 3 (three) times daily as needed for anxiety.)   lamoTRIgine (LAMICTAL) 100 MG tablet Take 1 tablet (100 mg total) by mouth daily.   losartan (COZAAR) 50 MG tablet Take 1 tablet (50 mg total) by mouth daily.   metFORMIN (GLUCOPHAGE-XR) 500 MG 24 hr tablet 1 tab by mouth twice daily with meals   metroNIDAZOLE (FLAGYL) 500 MG tablet Take 1 tablet (500 mg total) by mouth 3 (three) times daily.   pantoprazole (PROTONIX) 40 MG tablet Take 1 tablet (40 mg  total) by mouth 2 (two) times daily for 14 days.   QUEtiapine (SEROQUEL) 200 MG tablet Take 1 tablet (200 mg total) by mouth at bedtime.   Allergies  Allergen Reactions   Penicillins     REACTION: hives   Sulfonamide Derivatives     REACTION: hives   Past Medical History:  Diagnosis Date   Anemia    Bipolar disorder ([place])    Diabetes mellitus ([place]) 2022   type 2   Environmental and seasonal allergies 1968   Heart murmur    Upper GI bleed 01/15/2022   Past Surgical History:  Procedure Laterality Date   BIOPSY  01/17/2022   Procedure: BIOPSY;  Surgeon: [name], MD;  Location: [facility] ENDOSCOPY;  Service: Gastroenterology;;   [place]   ESOPHAGOGASTRODUODENOSCOPY (EGD) WITH PROPOFOL N/A 01/17/2022   Procedure: ESOPHAGOGASTRODUODENOSCOPY (EGD) WITH  PROPOFOL;  Surgeon: [name], MD;  Location: [facility];  Service: Gastroenterology;  Laterality: N/A;   HERNIA REPAIR     Umbilical hernia in the 2nd grade   TUBAL LIGATION  1990   Family History  Problem Relation Age of Onset   Diabetes Mother    Cancer Mother        liver--not clear if this was the primary   COPD Mother    Bipolar disorder Sister    Diabetes Sister    Schizophrenia Brother    Bipolar disorder Brother    Cancer Brother        Not sure of primary   Cancer Maternal Aunt        Breast cancer   Breast cancer Maternal Aunt        Cause of death   Cancer Maternal Aunt        pancreatic   Cancer Maternal Grandfather        throat   Social History   Socioeconomic History   Marital status: Married    Spouse name: [name]   Number of children: 3   Years of education: Not on file   Highest education level: GED or equivalent  Occupational History   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  Was drinking every day prior.  Was drinking due to stress.She and husband drink every evening.  Her counselor, Page, at [employer] is aware of her drinking.   Drug use: Yes    Types: Marijuana, "Crack" cocaine    Comment: Last cocaine use was 30 days ago.  No MJ for 2 months.   Sexual activity: Yes    Birth control/protection: Surgical  Other Topics Concern   Not on file  Social History Narrative   She and her husband live with her son and her granddaughter.    Born and raised in [place] Strain: Low Risk    Difficulty of Paying Living Expenses: Not very hard  Food  Insecurity: No Food Insecurity   Worried About Charity fundraiser in the Last Year: Never true   [employer] of Food in the Last Year: Never true  Transportation Needs: No Data processing manager (Medical): No   Lack of Transportation (Non-Medical): No  Physical Activity: Not on file  Stress: Not on file  Social Connections: Not on file  Intimate Partner Violence: Not At Risk   Fear of Current or Ex-Partner: No   Emotionally Abused: No   Physically Abused: No   Sexually Abused: No     Review of Systems  HENT:  Negative for dental problem.   Eyes:  Negative for visual disturbance (has not had an eye check in some time).  Respiratory:  Positive for shortness of breath (walking in grocery store, but due to back pain.).   Cardiovascular:  Negative for chest pain, palpitations and leg swelling.     Objective:   BP 140/88 (BP Location: Left Arm, Patient Position: Sitting, Cuff Size: Normal)    Pulse 92    Resp 12    Ht 5' 2.25" (1.581 m)    Wt 211 lb (95.7 kg)    LMP 06/09/2018 (Exact Date) Comment: Bleeding one month ago.   BMI 38.28 kg/m   Physical Exam Constitutional:      Appearance: She is obese.  HENT:     Head: Normocephalic and atraumatic.     Right Ear: Tympanic membrane, ear canal and external ear normal.     Left Ear: Tympanic membrane, ear canal and external ear normal.     Nose: Nose normal.     Mouth/Throat:     Mouth: Mucous membranes are moist.     Pharynx: Oropharynx is clear.     Comments: Broken caried tooth, upper right jaw--broken at gumline Eyes:     Extraocular Movements: Extraocular movements intact.     Conjunctiva/sclera: Conjunctivae normal.     Pupils: Pupils are equal, round, and reactive to light.     Funduscopic exam:    Right eye: Red reflex present.        Left eye: Red reflex present.    Comments: Discs sharp  Neck:     Thyroid: No thyroid mass or thyromegaly.  Cardiovascular:     Rate and Rhythm: Normal rate and  regular rhythm.     Heart sounds: S1 normal and S2 normal. No murmur heard.   No friction rub. No S3 or S4 sounds.     Comments: No carotid bruits.  Carotid, radial, femoral, DP and PT pulses normal and equal.    Pulmonary:     Effort: Pulmonary effort is normal.     Breath sounds: Normal breath sounds.  Chest:  Breasts:    Right: No inverted nipple, mass or nipple discharge.     Left: No inverted nipple, mass or nipple discharge.  Abdominal:     General: Bowel sounds are normal.     Palpations: Abdomen is soft. There is no hepatomegaly, splenomegaly or mass.     Tenderness: There is abdominal tenderness (Mild epigastric tenderness.  No rebound or peritoneal signs).     Hernia: No hernia is present.  Genitourinary:    Comments: Normal external female genitalia No cervical or vaginal lesions Scant dark brown blood around vaginal fornices and cervix. No uterine or adnexal mass or tenderness. Musculoskeletal:        General: Normal range of motion.     Cervical back: Normal range of motion and neck supple.  Feet:     Right foot:     Protective Sensation: 10 sites tested.  10 sites sensed.     Skin integrity:  No skin breakdown.     Toenail Condition: Right toenails are normal.     Left foot:     Protective Sensation: 10 sites tested.  10 sites sensed.     Skin integrity: Skin breakdown present.     Toenail Condition: Left toenails are normal.  Lymphadenopathy:     Head:     Right side of head: No submental or submandibular adenopathy.     Left side of head: No submental or submandibular adenopathy.     Cervical: No cervical adenopathy.     Upper Body:     Right upper body: No supraclavicular or axillary adenopathy.     Left upper body: No supraclavicular or axillary adenopathy.     Lower Body: No right inguinal adenopathy. No left inguinal adenopathy.  Skin:    General: Skin is warm.     Capillary Refill: Capillary refill takes less than 2 seconds.     Findings: No rash.   Neurological:     General: No focal deficit present.     Mental Status: She is alert and oriented to person, place, and time.     Cranial Nerves: Cranial nerves 2-12 are intact.     Sensory: Sensation is intact.     Motor: Motor function is intact.     Coordination: Coordination is intact.     Gait: Gait is intact.     Deep Tendon Reflexes: Reflexes are normal and symmetric.  Psychiatric:        Attention and Perception: Attention normal.        Mood and Affect: Mood normal.        Speech: Speech normal.        Behavior: Behavior normal. Behavior is cooperative.     Assessment & Plan    CPE with pap Mammogram already done for year Colonoscopy next week. 3-4 cups Lactaid milk daily Water exercise. Moderna bivalent Shingrix #1/2  second in 2-6 months.  Vaccine Card given.   2.  Tobacco abuse:  wants to wait until follow up end of March/beginning of April   3.  Alcohol abuse:  wants to see if can maintain alcohol free status on own.  Will discuss with counselor, Page, and will look at [facility] and [facility] in the meantime.  Discussed cannot drink with Metronidazole, which she is to start soon.   4.  H. Pylori gastritis:  discussed she needs to take the pepto bismal for 14 days 4 times daily and get her other 2 abx as well and take a directed.  On Pantoprazole as well.    5.  Hypertension:  not well controlled.  Will see where she is at in March before increasing meds.  Encouraged continued lifestyle changes.    6.  New onset DM:  from December.  Tolerating Metformin.  Again, significant lifestyle changes.  7.  Drug use:  uses only when drinking.  No crack for a month.  MJ intermittently.

## 2022-01-23 NOTE — Patient Instructions (Signed)
[facility] [place], [place] [address] Office: [phone]   Fax: [phone]    [facility] in [place], [place] COVID-19 info: [email or web address] Address: [address] [place], [place] [address] Hours:  Open 24 hours Health & safety: Mask required  Temperature check required  Staff wear masks  Staff get temperature checks  Staff required to disinfect surfaces between visits  More details Phone: [phone]

## 2022-01-24 ENCOUNTER — Telehealth: Payer: Self-pay | Admitting: *Deleted

## 2022-01-24 ENCOUNTER — Other Ambulatory Visit: Payer: Self-pay

## 2022-01-24 ENCOUNTER — Ambulatory Visit (AMBULATORY_SURGERY_CENTER): Payer: Medicaid Other | Admitting: *Deleted

## 2022-01-24 VITALS — Ht 62.0 in | Wt 211.0 lb

## 2022-01-24 DIAGNOSIS — Z1211 Encounter for screening for malignant neoplasm of colon: Secondary | ICD-10-CM

## 2022-01-24 MED ORDER — NA SULFATE-K SULFATE-MG SULF 17.5-3.13-1.6 GM/177ML PO SOLN: 2.0000 | Freq: Once | ORAL | 0 refills | Status: AC

## 2022-01-24 NOTE — Telephone Encounter (Signed)
Patient no show/answer PV appointment for today. I called patient, no answer, left a message for the patient to call us back today before 5 pm or the PV and procedure will be cancelled.  

## 2022-01-24 NOTE — Progress Notes (Signed)
No egg or soy allergy known to patient  No issues known to pt with past sedation with any surgeries or procedures Patient denies ever being told they had issues or difficulty with intubation  No FH of Malignant Hyperthermia Pt is not on diet pills Pt is not on  home 02  Pt is not on blood thinners  Pt denies issues with constipation  No A fib or A flutter  Pt is fully vaccinated  for Covid    Due to the COVID-19 pandemic we are asking patients to follow certain guidelines in PV and the [place]   Pt aware of COVID protocols and [facility] guidelines   PV completed over the phone. Pt verified name, DOB, address and insurance during PV today.  Pt e- mailed instruction packet to her provided email Pt encouraged to call with questions or issues.  If pt has My chart, procedure instructions sent via My Chart

## 2022-01-25 LAB — CYTOLOGY - PAP

## 2022-01-28 ENCOUNTER — Other Ambulatory Visit: Payer: Medicaid Other

## 2022-01-28 ENCOUNTER — Telehealth: Payer: Self-pay | Admitting: Gastroenterology

## 2022-01-28 NOTE — Telephone Encounter (Signed)
Went over prep instructions with the patient and sent to pt's email. Pt aware to look at email and call us back with questions.

## 2022-01-28 NOTE — Telephone Encounter (Signed)
Patient has procedure tomorrow but never got her instructions.  She needs walking through them so she can get started on her prep.  Please call asap to make sure she knows everything she needs to know to complete the prep properly.  Thank you.

## 2022-01-29 ENCOUNTER — Other Ambulatory Visit: Payer: Self-pay

## 2022-01-29 ENCOUNTER — Encounter: Payer: Self-pay | Admitting: Gastroenterology

## 2022-01-29 ENCOUNTER — Ambulatory Visit (AMBULATORY_SURGERY_CENTER): Payer: Medicaid Other | Admitting: Gastroenterology

## 2022-01-29 VITALS — BP 152/96 | HR 73 | Temp 96.9°F | Resp 19 | Ht 62.0 in | Wt 211.0 lb

## 2022-01-29 DIAGNOSIS — D124 Benign neoplasm of descending colon: Secondary | ICD-10-CM

## 2022-01-29 DIAGNOSIS — D123 Benign neoplasm of transverse colon: Secondary | ICD-10-CM

## 2022-01-29 DIAGNOSIS — D125 Benign neoplasm of sigmoid colon: Secondary | ICD-10-CM

## 2022-01-29 DIAGNOSIS — D122 Benign neoplasm of ascending colon: Secondary | ICD-10-CM

## 2022-01-29 DIAGNOSIS — Z1211 Encounter for screening for malignant neoplasm of colon: Secondary | ICD-10-CM

## 2022-01-29 MED ORDER — SODIUM CHLORIDE 0.9 % IV SOLN: 500.0000 mL | Freq: Once | INTRAVENOUS | Status: DC

## 2022-01-29 NOTE — Progress Notes (Signed)
Pt awake, report to RN, [facility]  °

## 2022-01-29 NOTE — Patient Instructions (Signed)
Handouts given for diverticulosis, High Fiber diet and polyps.  YOU HAD AN ENDOSCOPIC PROCEDURE TODAY AT [facility]:   Refer to the procedure report that was given to you for any specific questions about what was found during the examination.  If the procedure report does not answer your questions, please call your gastroenterologist to clarify.  If you requested that your care partner not be given the details of your procedure findings, then the procedure report has been included in a sealed envelope for you to review at your convenience later.  YOU SHOULD EXPECT: Some feelings of bloating in the abdomen. Passage of more gas than usual.  Walking can help get rid of the air that was put into your GI tract during the procedure and reduce the bloating. If you had a lower endoscopy (such as a colonoscopy or flexible sigmoidoscopy) you may notice spotting of blood in your stool or on the toilet paper. If you underwent a bowel prep for your procedure, you may not have a normal bowel movement for a few days.  Please Note:  You might notice some irritation and congestion in your nose or some drainage.  This is from the oxygen used during your procedure.  There is no need for concern and it should clear up in a day or so.  SYMPTOMS TO REPORT IMMEDIATELY:  Following lower endoscopy (colonoscopy):  Excessive amounts of blood in the stool  Significant tenderness or worsening of abdominal pains  Swelling of the abdomen that is new, acute  Fever of 100F or higher  For urgent or emergent issues, a gastroenterologist can be reached at any hour by calling [phone]. Do not use MyChart messaging for urgent concerns.   DIET:  We do recommend a small meal at first, but then you may proceed to your regular diet.  Drink plenty of fluids but you should avoid alcoholic beverages for 24 hours.  ACTIVITY:  You should plan to take it easy for the rest of today and you should NOT DRIVE or use heavy  machinery until tomorrow (because of the sedation medicines used during the test).    FOLLOW UP: Our staff will call the number listed on your records 48-72 hours following your procedure to check on you and address any questions or concerns that you may have regarding the information given to you following your procedure. If we do not reach you, we will leave a message.  We will attempt to reach you two times.  During this call, we will ask if you have developed any symptoms of COVID 19. If you develop any symptoms (ie: fever, flu-like symptoms, shortness of breath, cough etc.) before then, please call [phone].  If you test positive for Covid 19 in the 2 weeks post procedure, please call and report this information to [place].    If any biopsies were taken you will be contacted by phone or by letter within the next 1-3 weeks.  Please call us at [phone] if you have not heard about the biopsies in 3 weeks.    SIGNATURES/CONFIDENTIALITY: You and/or your care partner have signed paperwork which will be entered into your electronic medical record.  These signatures attest to the fact that that the information above on your After Visit Summary has been reviewed and is understood.  Full responsibility of the confidentiality of this discharge information lies with you and/or your care-partner.

## 2022-01-29 NOTE — Progress Notes (Signed)
Referring Provider: Mack Hook, MD Primary Care Physician:  Mack Hook, MD  Reason for Procedure:  Colon cancer screening   IMPRESSION:  Need for colon cancer screening Appropriate candidate for monitored anesthesia care  PLAN: Colonoscopy in the [place] today   HPI: Cheyenne Fowler is a 56 y.o. female presents for screening colonoscopy.  No prior colonoscopy or colon cancer screening.  No baseline GI symptoms.   No known family history of colon cancer or polyps. No family history of uterine/endometrial cancer, pancreatic cancer or gastric/stomach cancer.   Past Medical History:  Diagnosis Date   Anemia    Bipolar disorder ([place])    Diabetes mellitus ([place]) 2022   type 2   Environmental and seasonal allergies 1968   GERD (gastroesophageal reflux disease)    Heart murmur    Hypertension    Upper GI bleed 01/15/2022    Past Surgical History:  Procedure Laterality Date   BIOPSY  01/17/2022   Procedure: BIOPSY;  Surgeon: [name], MD;  Location: [facility];  Service: Gastroenterology;;   [place]   ESOPHAGOGASTRODUODENOSCOPY (EGD) WITH PROPOFOL N/A 01/17/2022   Procedure: ESOPHAGOGASTRODUODENOSCOPY (EGD) WITH PROPOFOL;  Surgeon: [name], MD;  Location: [facility];  Service: Gastroenterology;  Laterality: N/A;   HERNIA REPAIR     Umbilical hernia in the 2nd grade   TUBAL LIGATION  1990    Current Outpatient Medications  Medication Sig Dispense Refill   bismuth subsalicylate (PEPTO-BISMOL) 262 MG chewable tablet Chew 1 tablet (262 mg total) by mouth 4 (four) times daily for 14 days. (Patient taking differently: Chew 262 mg by mouth 4 (four) times daily. As needed) 56 tablet 0   cetirizine (ZYRTEC) 10 MG tablet Take 10 mg by mouth daily.     clarithromycin (BIAXIN) 500 MG tablet Take 1 tablet (500 mg total) by mouth 2 (two) times daily. 20 tablet 0   gabapentin (NEURONTIN) 100 MG capsule Take 1 capsule (100  mg total) by mouth at bedtime. 30 capsule 11   hydrOXYzine (VISTARIL) 50 MG capsule Take 1 capsule (50 mg total) by mouth 3 (three) times daily as needed. (Patient taking differently: Take 50 mg by mouth 3 (three) times daily as needed for anxiety.) 90 capsule 3   lamoTRIgine (LAMICTAL) 100 MG tablet Take 1 tablet (100 mg total) by mouth daily. 30 tablet 3   losartan (COZAAR) 50 MG tablet Take 1 tablet (50 mg total) by mouth daily. 30 tablet 11   metFORMIN (GLUCOPHAGE-XR) 500 MG 24 hr tablet 1 tab by mouth twice daily with meals 60 tablet 11   metroNIDAZOLE (FLAGYL) 500 MG tablet Take 1 tablet (500 mg total) by mouth 3 (three) times daily. 30 tablet 0   pantoprazole (PROTONIX) 40 MG tablet Take 1 tablet (40 mg total) by mouth 2 (two) times daily for 14 days. 28 tablet 0   QUEtiapine (SEROQUEL) 200 MG tablet Take 1 tablet (200 mg total) by mouth at bedtime. 30 tablet 3   Current Facility-Administered Medications  Medication Dose Route Frequency Provider Last Rate Last Admin   0.9 %  sodium chloride infusion  500 mL Intravenous Once [name], MD        Allergies as of 01/29/2022 - Review Complete 01/29/2022  Allergen Reaction Noted   Penicillins  08/17/2007   Sulfonamide derivatives  08/17/2007    Family History  Problem Relation Age of Onset   Diabetes Mother    Cancer Mother  liver--not clear if this was the primary   COPD Mother    Bipolar disorder Sister    Diabetes Sister    Schizophrenia Brother    Bipolar disorder Brother    Cancer Brother        Not sure of primary   Cancer Maternal Aunt        Breast cancer   Breast cancer Maternal Aunt        Cause of death   Cancer Maternal Aunt        pancreatic   Cancer Maternal Grandfather        throat   Colon cancer Neg Hx    Colon polyps Neg Hx    Esophageal cancer Neg Hx    Stomach cancer Neg Hx    Rectal cancer Neg Hx      Physical Exam: General:   Alert,  well-nourished, pleasant and cooperative in  NAD Head:  Normocephalic and atraumatic. Eyes:  Sclera clear, no icterus.   Conjunctiva pink. Mouth:  No deformity or lesions.   Neck:  Supple; no masses or thyromegaly. Lungs:  Clear throughout to auscultation.   No wheezes. Heart:  Regular rate and rhythm; no murmurs. Abdomen:  Soft, non-tender, nondistended, normal bowel sounds, no rebound or guarding.  Msk:  Symmetrical. No boney deformities LAD: No inguinal or umbilical LAD Extremities:  No clubbing or edema. Neurologic:  Alert and  oriented x4;  grossly nonfocal Skin:  No obvious rash or bruise. Psych:  Alert and cooperative. Normal mood and affect.     Studies/Results: No results found.    [name], MD, MPH 01/29/2022, 11:31 AM

## 2022-01-29 NOTE — Op Note (Signed)
[facility] [name] Procedure Date: 01/29/2022 11:35 AM MRN: [id number] Endoscopist: [name] MD, MD Age: 56 Referring MD:  Date of Birth: Oct 09, 1966 Gender: Female Account #: [id number] Procedure:                Colonoscopy Indications:              Screening for colorectal malignant neoplasm, This                            is the patient's first colonoscopy                           No known family history of colon cancer or polyps Medicines:                Monitored Anesthesia Care Procedure:                Pre-Anesthesia Assessment:                           - Prior to the procedure, a History and Physical                            was performed, and patient medications and                            allergies were reviewed. The patient's tolerance of                            previous anesthesia was also reviewed. The risks                            and benefits of the procedure and the sedation                            options and risks were discussed with the patient.                            All questions were answered, and informed consent                            was obtained. Prior Anticoagulants: The patient has                            taken no previous anticoagulant or antiplatelet                            agents. ASA Grade Assessment: III - A patient with                            severe systemic disease. After reviewing the risks                            and benefits, the patient was deemed in  satisfactory condition to undergo the procedure.                           After obtaining informed consent, the colonoscope                            was passed under direct vision. Throughout the                            procedure, the patient's blood pressure, pulse, and                            oxygen saturations were monitored continuously. The                            CF [id number] #[id number]  was introduced through the anus                            and advanced to the 3 cm into the ileum. A second                            forward view of the right colon was performed. The                            colonoscopy was performed with moderate difficulty                            due to a redundant colon, significant looping and a                            tortuous colon. Successful completion of the                            procedure was aided by changing the patient's                            position, withdrawing and reinserting the scope and                            applying abdominal pressure. The patient tolerated                            the procedure well. The quality of the bowel                            preparation was good. The terminal ileum, ileocecal                            valve, appendiceal orifice, and rectum were                            photographed. Scope In: 11:43:08 AM Scope Out: 73:71:06 PM Scope Withdrawal Time: 0 hours 17 minutes 40 seconds  Total Procedure Duration: 0 hours 23 minutes 0 seconds  Findings:                 The perianal and digital rectal examinations were                            normal.                           Multiple small and large-mouthed diverticula were                            found in the sigmoid colon.                           A 10 mm polyp was found in the distal sigmoid                            colon. The polyp was semi-pedunculated with a broad                            base and had a verrucous appearance. The polyp was                            removed with a cold snare. Resection and retrieval                            were complete. Area was tattooed with an injection                            of 2 mL of [place] ink.                           A 6 mm polyp was found in the proximal sigmoid                            colon. The polyp was semi-pedunculated. The polyp                            was  removed with a cold snare. Resection and                            retrieval were complete. Estimated blood loss was                            minimal.                           Four sessile polyps were found in the descending                            colon, splenic flexure, transverse colon and cecum.                            The  polyps were 2 to 4 mm in size. These polyps                            were removed with a cold snare. Resection and                            retrieval were complete. Estimated blood loss was                            minimal.                           The exam was otherwise without abnormality on                            direct and retroflexion views. Complications:            No immediate complications. Estimated blood loss:                            Minimal. Estimated Blood Loss:     Estimated blood loss was minimal. Impression:               - Diverticulosis in the sigmoid colon.                           - One 10 mm polyp in the distal sigmoid colon,                            removed with a cold snare. Resected and retrieved.                            Tattooed.                           - One 6 mm polyp in the proximal sigmoid colon,                            removed with a cold snare. Resected and retrieved.                           - Four 2 to 4 mm polyps in the descending colon, at                            the splenic flexure, in the transverse colon and in                            the cecum, removed with a cold snare. Resected and                            retrieved.                           - The examination was otherwise normal on direct  and retroflexion views. Recommendation:           - Patient has a contact number available for                            emergencies. The signs and symptoms of potential                            delayed complications were discussed with the                             patient. Return to normal activities tomorrow.                            Written discharge instructions were provided to the                            patient.                           - High fiber diet.                           - Continue present medications.                           - Await pathology results.                           - Repeat colonoscopy date to be determined after                            pending pathology results are reviewed for                            surveillance.                           - Follow a high fiber diet. Drink at least 64                            ounces of water daily. Add a daily stool bulking                            agent such as psyllium (an exampled would be                            Metamucil).                           - Emerging evidence supports eating a diet of                            fruits, vegetables, grains, calcium, and yogurt                            while  reducing red meat and alcohol may reduce the                            risk of colon cancer.                           - Thank you for allowing me to be involved in your                            colon cancer prevention. [name] MD, MD 01/29/2022 12:17:51 PM This report has been signed electronically.

## 2022-01-29 NOTE — Progress Notes (Signed)
VS  DT ? ?Pt's states no medical or surgical changes since previsit or office visit. ? ?

## 2022-01-29 NOTE — Progress Notes (Signed)
Pt vomited coffee liquid towards the end of procedure.  Suctioned, pt awake following end of procedure

## 2022-01-31 ENCOUNTER — Telehealth: Payer: Self-pay

## 2022-01-31 NOTE — Telephone Encounter (Signed)
°  Follow up Call-  Call back number 01/29/2022  Post procedure Call Back phone  # [phone]  Permission to leave phone message Yes  Some recent data might be hidden     Patient questions:  Do you have a fever, pain , or abdominal swelling? No. Pain Score  0 *  Have you tolerated food without any problems? Yes.    Have you been able to return to your normal activities? Yes.    Do you have any questions about your discharge instructions: Diet   No. Medications  No. Follow up visit  No.  Do you have questions or concerns about your Care? No.  Actions: * If pain score is 4 or above: No action needed, pain <4.  Have you developed a fever since your procedure? no  2.   Have you had an respiratory symptoms (SOB or cough) since your procedure? no  3.   Have you tested positive for COVID 19 since your procedure no  4.   Have you had any family members/close contacts diagnosed with the COVID 19 since your procedure?  no   If yes to any of these questions please route to Joylene John, RN and Joella Prince, RN

## 2022-01-31 NOTE — Telephone Encounter (Signed)
No answer on follow up call. Voicemail full.  

## 2022-02-03 ENCOUNTER — Encounter: Payer: Self-pay | Admitting: Gastroenterology

## 2022-02-06 ENCOUNTER — Telehealth (INDEPENDENT_AMBULATORY_CARE_PROVIDER_SITE_OTHER): Payer: Medicaid Other | Admitting: Psychiatry

## 2022-02-06 ENCOUNTER — Encounter (HOSPITAL_COMMUNITY): Payer: Self-pay | Admitting: Psychiatry

## 2022-02-06 DIAGNOSIS — F3130 Bipolar disorder, current episode depressed, mild or moderate severity, unspecified: Secondary | ICD-10-CM | POA: Diagnosis not present

## 2022-02-06 MED ORDER — LAMOTRIGINE 100 MG PO TABS: 100.0000 mg | ORAL_TABLET | Freq: Every day | ORAL | 3 refills | Status: DC

## 2022-02-06 MED ORDER — QUETIAPINE FUMARATE 100 MG PO TABS: 100.0000 mg | ORAL_TABLET | Freq: Every day | ORAL | 3 refills | Status: DC

## 2022-02-06 MED ORDER — HYDROXYZINE PAMOATE 50 MG PO CAPS: 50.0000 mg | ORAL_CAPSULE | Freq: Three times a day (TID) | ORAL | 3 refills | Status: DC | PRN

## 2022-02-06 MED ORDER — QUETIAPINE FUMARATE 200 MG PO TABS: 200.0000 mg | ORAL_TABLET | Freq: Every day | ORAL | 3 refills | Status: DC

## 2022-02-06 NOTE — Progress Notes (Signed)
BH MD/PA/NP OP Progress Note Virtual Visit via Telephone Note  I connected with Cheyenne Fowler on 02/06/22 at 10:30 AM EST by telephone and verified that I am speaking with the correct person using two identifiers.  Location: Patient: home Provider: Clinic   I discussed the limitations, risks, security and privacy concerns of performing an evaluation and management service by telephone and the availability of in person appointments. I also discussed with the patient that there may be a patient responsible charge related to this service. The patient expressed understanding and agreed to proceed.   I provided 30 minutes of non-face-to-face time during this encounter.        02/06/2022 10:59 AM Cheyenne Fowler  MRN:  [id number]  Chief Complaint:  "Everything is okay"   HPI: 56 year-old female seen today for follow-up psychiatric evaluation. She has a psychiatric history of polysubstance use (cocaine, alcohol), depression, and bipolar disorder. She was being managed on Lamictal 100mg  daily, Seroquel 200mg  nightly, and Vistaril 50mg  3 times daily as needed. She reports she reduced her Seroquel to 100 mg and notes that her medications now are effective in managing her psychiatric conditions.  Today she was unable to login virtual so her assessment was done on the phone. During exam she was pleasant, cooperative, and engaged in conversation. She informs provider that since her last visit she had an intestinal bacterial infection. She reports that she is still taking antibiotics but notes that she is progressively getting better. Recently she notes that her son and his brother has also moved in with her and her husband.  She reports that she is concerned about her son and her brother as they currently have no place to go.  She is hopeful because her son recently got a job.    She notes her mood is stable since restarting her psychiatric medications.She reports that she reduced her Seroquel to  100 mg because it made her groggy but notes that now she feels mentally stable.  She informed Probation officer that her anxiety and depression has improved.  Provider conducted a GAD-7 and patient scored a 16, at her last visit she scored 21.  Provider also conducted PHQ-9 and patient scored a 7, at her last visit she scored a 24.  She reports that she has been dieting and exercising because she was told that she has diabetes.  Since her last visit she reports that she has lost 14 pounds.  She endorses adequate sleep.  Today she denies SI/HI/AVH, mania, paranoia.  Patient informed Probation officer that she has been more active with her granddaughter.  She notes that some days she takes her to school with her mother has to work.  She informed Probation officer that she is glad that she has something to keep her busy and off the streets.  She reports that she continues to be sober.  Patient also notes that she is in a better place financially as her husband has been contributing to the rent in her disability has been approved   Today she is agreeable to reduced Seroquel 200 mg to 100 mg to help manage grogginess.  She will continue her other medication as prescribed and follow-up with outpatient therapy as directed. No other concerns today. Visit Diagnosis:    ICD-10-CM   1. Bipolar I disorder, most recent episode depressed (HCC)  F31.30 lamoTRIgine (LAMICTAL) 100 MG tablet    hydrOXYzine (VISTARIL) 50 MG capsule    QUEtiapine (SEROQUEL) 100 MG tablet    DISCONTINUED:  QUEtiapine (SEROQUEL) 200 MG tablet      Past Psychiatric History: Depression, substance induced mood disorder, Bipolar disorder,  Past Medical History:  Past Medical History:  Diagnosis Date   Anemia    Bipolar disorder ([place])    Diabetes mellitus ([place]) 2022   type 2   Environmental and seasonal allergies 1968   GERD (gastroesophageal reflux disease)    Heart murmur    Hypertension    Upper GI bleed 01/15/2022    Past Surgical History:  Procedure  Laterality Date   BIOPSY  01/17/2022   Procedure: BIOPSY;  Surgeon: [name], MD;  Location: [place];  Service: Gastroenterology;;   [place]   ESOPHAGOGASTRODUODENOSCOPY (EGD) WITH PROPOFOL N/A 01/17/2022   Procedure: ESOPHAGOGASTRODUODENOSCOPY (EGD) WITH PROPOFOL;  Surgeon: [name], MD;  Location: [place];  Service: Gastroenterology;  Laterality: N/A;   HERNIA REPAIR     Umbilical hernia in the 2nd grade   TUBAL LIGATION  1990    Family Psychiatric History: Brother schizophrenia and bipolar, sister bipolar disorder  Family History:  Family History  Problem Relation Age of Onset   Diabetes Mother    Cancer Mother        liver--not clear if this was the primary   COPD Mother    Bipolar disorder Sister    Diabetes Sister    Schizophrenia Brother    Bipolar disorder Brother    Cancer Brother        Not sure of primary   Cancer Maternal Aunt        Breast cancer   Breast cancer Maternal Aunt        Cause of death   Cancer Maternal Aunt        pancreatic   Cancer Maternal Grandfather        throat   Colon cancer Neg Hx    Colon polyps Neg Hx    Esophageal cancer Neg Hx    Stomach cancer Neg Hx    Rectal cancer Neg Hx     Social History:  Social History   Socioeconomic History   Marital status: Married    Spouse name: [name]   Number of children: 3   Years of education: Not on file   Highest education level: GED or equivalent  Occupational History   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  Was drinking every day prior.  Was drinking due to stress.She and husband drink every  evening.  Her counselor, Page, at [employer] is aware of her drinking.   Drug use: Yes    Types: Marijuana, "Crack" cocaine    Comment: Last cocaine use was 30 days ago.  No MJ for 2 months.   Sexual activity: Yes    Birth control/protection: Surgical  Other Topics Concern   Not on file  Social History Narrative   She and her husband live with her son and her granddaughter.    Born and raised in [place] Resource Strain: Low Risk    Difficulty of Paying  Living Expenses: Not very hard  Food Insecurity: No Food Insecurity   Worried About [place] in the Last Year: Never true   Ran Out of Food in the Last Year: Never true  Transportation Needs: No Transportation Needs   Lack of Transportation (Medical): No   Lack of Transportation (Non-Medical): No  Physical Activity: Not on file  Stress: Not on file  Social Connections: Not on file    Allergies:  Allergies  Allergen Reactions   Penicillins     REACTION: hives   Sulfonamide Derivatives     REACTION: hives    Metabolic Disorder Labs: Lab Results  Component Value Date   HGBA1C 6.6 (H) 11/22/2021   MPG 119.76 07/03/2018   MPG 137 (H) 07/14/2012   No results found for: PROLACTIN Lab Results  Component Value Date   CHOL 211 (H) 11/22/2021   TRIG 151 (H) 11/22/2021   HDL 68 11/22/2021   CHOLHDL 4.3 07/03/2018   VLDL 31 07/03/2018   LDLCALC 117 (H) 11/22/2021   LDLCALC 124 (H) 04/27/2020   Lab Results  Component Value Date   TSH 0.828 05/23/2012    Therapeutic Level Labs: No results found for: LITHIUM No results found for: VALPROATE No components found for:  CBMZ  Current Medications: Current Outpatient Medications  Medication Sig Dispense Refill   cetirizine (ZYRTEC) 10 MG tablet Take 10 mg by mouth daily.     clarithromycin (BIAXIN) 500 MG tablet Take 1 tablet (500 mg total) by mouth 2 (two) times daily. 20 tablet 0   gabapentin (NEURONTIN) 100  MG capsule Take 1 capsule (100 mg total) by mouth at bedtime. 30 capsule 11   hydrOXYzine (VISTARIL) 50 MG capsule Take 1 capsule (50 mg total) by mouth 3 (three) times daily as needed. 90 capsule 3   lamoTRIgine (LAMICTAL) 100 MG tablet Take 1 tablet (100 mg total) by mouth daily. 30 tablet 3   losartan (COZAAR) 50 MG tablet Take 1 tablet (50 mg total) by mouth daily. 30 tablet 11   metFORMIN (GLUCOPHAGE-XR) 500 MG 24 hr tablet 1 tab by mouth twice daily with meals 60 tablet 11   metroNIDAZOLE (FLAGYL) 500 MG tablet Take 1 tablet (500 mg total) by mouth 3 (three) times daily. 30 tablet 0   pantoprazole (PROTONIX) 40 MG tablet Take 1 tablet (40 mg total) by mouth 2 (two) times daily for 14 days. 28 tablet 0   QUEtiapine (SEROQUEL) 100 MG tablet Take 1 tablet (100 mg total) by mouth at bedtime. 30 tablet 3   No current facility-administered medications for this visit.     Musculoskeletal: Strength & Muscle Tone:  Unable to assess due to telephone visit Gait & Station:  unable to assess due to telephone visit Patient leans: N/A  Psychiatric Specialty Exam: Review of Systems  Last menstrual period 06/09/2018.There is no height or weight on file to calculate BMI.  General Appearance:  Unable to assess due to telephone visit  Eye Contact:   Unable to assess due to telephone visit  Speech:  Clear and Coherent and Normal Rate  Volume:  Normal  Mood:  Euthymic  Affect:  Congruent  Thought Process:  Coherent, Goal Directed and Linear  Orientation:  Full (Time, Place, and Person)  Thought Content: WDL and Logical   Suicidal Thoughts:  No  Homicidal Thoughts:  No  Memory:  Immediate;   Good Recent;   Good Remote;   Good  Judgement:  Good  Insight:  Good  Psychomotor Activity:   Unable to assess due to telephone visit  Concentration:  Concentration: Good and Attention Span: Good  Recall:  Good  Fund of Knowledge: Good  Language: Good  Akathisia:  No  Handed:  Right  AIMS (if  indicated): not done, unable to assess due to telephone visit  Assets:  Communication Skills Desire for Improvement Housing Leisure Time Social Support  ADL's:  Intact  Cognition: WNL  Sleep:  Good   Screenings: GAD-7    Flowsheet Row Video Visit from 02/06/2022 in [place] from 11/12/2021 in [facility] Video Visit from 10/12/2020 in [facility]  Total GAD-7 Score 16 21 17       PHQ2-9    Flowsheet Row Video Visit from 02/06/2022 in [place] from 11/12/2021 in [facility] Video Visit from 10/12/2020 in [facility] Office Visit from 08/27/2018 in [place] Office Visit from 07/22/2018 in [place]  PHQ-2 Total Score 5 6 4 6 6   PHQ-9 Total Score 7 24 18 20 18       Flowsheet Row Video Visit from 02/06/2022 in [facility] ED to Hosp-Admission (Discharged) from 01/15/2022 in [facility] [place] from 11/12/2021 in [place] No Risk No Risk Error: Q7 should not be populated when Q6 is No        Assessment and Plan: Patient that her anxiety, depression, and sleep has improved since her last visit.  She does endorse feeling groggy on 200 mg of Seroquel and notes that she reduced it to 100 mg.  Today she is agreeable to reduced Seroquel 200 mg to 100 mg to help manage grogginess.  She will continue her other medication as prescribed and follow-up with outpatient therapy as directed.  1. Bipolar I disorder, most recent episode depressed (HCC)  Continue- lamoTRIgine (LAMICTAL) 100 MG tablet; Take 1 tablet (100 mg total) by mouth daily.  Dispense: 30 tablet; Refill: 3 Continue- hydrOXYzine (VISTARIL) 50 MG  capsule; Take 1 capsule (50 mg total) by mouth 3 (three) times daily as needed.  Dispense: 90 capsule; Refill: 3 Reduced- QUEtiapine (SEROQUEL) 100 MG tablet; Take 1 tablet (100 mg total) by mouth at bedtime.  Dispense: 30 tablet; Refill: 3     Salley Slaughter, NP 02/06/2022, 10:59 AM

## 2022-02-06 NOTE — Telephone Encounter (Signed)
Pre visit complete

## 2022-02-07 ENCOUNTER — Encounter: Payer: Medicaid Other | Admitting: Gastroenterology

## 2022-02-14 ENCOUNTER — Encounter: Payer: Self-pay | Admitting: Gastroenterology

## 2022-02-14 ENCOUNTER — Ambulatory Visit (INDEPENDENT_AMBULATORY_CARE_PROVIDER_SITE_OTHER): Payer: Medicaid Other | Admitting: Gastroenterology

## 2022-02-14 VITALS — BP 114/64 | HR 84 | Resp 18 | Ht 62.0 in | Wt 210.8 lb

## 2022-02-14 DIAGNOSIS — K2970 Gastritis, unspecified, without bleeding: Secondary | ICD-10-CM

## 2022-02-14 DIAGNOSIS — B9681 Helicobacter pylori [H. pylori] as the cause of diseases classified elsewhere: Secondary | ICD-10-CM | POA: Diagnosis not present

## 2022-02-14 DIAGNOSIS — K209 Esophagitis, unspecified without bleeding: Secondary | ICD-10-CM

## 2022-02-14 DIAGNOSIS — A048 Other specified bacterial intestinal infections: Secondary | ICD-10-CM | POA: Diagnosis not present

## 2022-02-14 MED ORDER — PANTOPRAZOLE SODIUM 40 MG PO TBEC: 40.0000 mg | DELAYED_RELEASE_TABLET | Freq: Two times a day (BID) | ORAL | 3 refills | Status: DC

## 2022-02-14 NOTE — Progress Notes (Signed)
Referring Provider: Julieanne Manson, MD Primary Care Physician:  Julieanne Manson, MD  Chief complaint: H. pylori   IMPRESSION:  H. pylori gastritis and reflux esophagitis on EGD 01/17/2022.  Complete resolution of symptoms after completing 14 days of quadruple therapy. Awaiting follow-up testing to confirm eradication. She knows to have that test performed off of her PPI. Although, she requested a refill of her PPI to continue to use PRN.    History of colon polyps.  6 tubular adenomas removed on colonoscopy 01/29/2022.  Surveillance colonoscopy recommended in 3 years.  Office follow-up as needed. However, she should return at least annually if she continues to require PPI therapy.    HPI: Cheyenne Fowler is a 56 y.o. female who returns in follow-up.  She has bipolar disorder, type 2 diabetes, hypertension, hyperlipidemia, chronic low back pain, history of substance use disorder, and fatty liver noted on a CT in 2018.  I met her during her hospitalization last month when she had multiple episodes of bloody emesis.  CTA of the chest, abdomen, and pelvis showed no acute findings.  EGD 01/17/2022 showed LA grade a reflux esophagitis and H. pylori gastritis.  She was treated with pantoprazole and told to avoid NSAIDs.  She completed 14 days of treatment with metronidazole, clarithromycin, bismuth subsalicylate, and pantoprazole.  She had an outpatient screening colonoscopy 01/29/2022 that showed sigmoid diverticulosis, a 10 mm polyp in the sigmoid colon, a 6 mm polyp in the sigmoid colon, 4 small polyps throughout the rest of the colon.  Pathology results confirmed 6 tubular adenomas.  Surveillance colonoscopy recommended in 3 years.  She returns today in follow-up.  Her GI symptoms have resolved. Even her back/flank pain is improved.  She has no specific complaints or concerns at this time. GI ROS is negative.   CBC was normal 01/17/22   Past Medical History:  Diagnosis Date   Anemia     Bipolar disorder (HCC)    Diabetes mellitus (HCC) 2022   type 2   Environmental and seasonal allergies 1968   GERD (gastroesophageal reflux disease)    Heart murmur    Hypertension    Upper GI bleed 01/15/2022    Past Surgical History:  Procedure Laterality Date   BIOPSY  01/17/2022   Procedure: BIOPSY;  Surgeon: Tressia Danas, MD;  Location: [facility] ENDOSCOPY;  Service: Gastroenterology;;   CESAREAN SECTION  1982, 1983, 1990   ESOPHAGOGASTRODUODENOSCOPY (EGD) WITH PROPOFOL N/A 01/17/2022   Procedure: ESOPHAGOGASTRODUODENOSCOPY (EGD) WITH PROPOFOL;  Surgeon: Tressia Danas, MD;  Location: [facility] ENDOSCOPY;  Service: Gastroenterology;  Laterality: N/A;   HERNIA REPAIR     Umbilical hernia in the 2nd grade   TUBAL LIGATION  1990     Current Outpatient Medications  Medication Sig Dispense Refill   cetirizine (ZYRTEC) 10 MG tablet Take 10 mg by mouth daily.     gabapentin (NEURONTIN) 100 MG capsule Take 1 capsule (100 mg total) by mouth at bedtime. 30 capsule 11   hydrOXYzine (VISTARIL) 50 MG capsule Take 1 capsule (50 mg total) by mouth 3 (three) times daily as needed. 90 capsule 3   lamoTRIgine (LAMICTAL) 100 MG tablet Take 1 tablet (100 mg total) by mouth daily. 30 tablet 3   losartan (COZAAR) 50 MG tablet Take 1 tablet (50 mg total) by mouth daily. 30 tablet 11   metFORMIN (GLUCOPHAGE-XR) 500 MG 24 hr tablet 1 tab by mouth twice daily with meals 60 tablet 11   QUEtiapine (SEROQUEL) 100 MG tablet Take 1  tablet (100 mg total) by mouth at bedtime. 30 tablet 3   clarithromycin (BIAXIN) 500 MG tablet Take 1 tablet (500 mg total) by mouth 2 (two) times daily. (Patient not taking: Reported on 02/14/2022) 20 tablet 0   metroNIDAZOLE (FLAGYL) 500 MG tablet Take 1 tablet (500 mg total) by mouth 3 (three) times daily. (Patient not taking: Reported on 02/14/2022) 30 tablet 0   pantoprazole (PROTONIX) 40 MG tablet Take 1 tablet (40 mg total) by mouth 2 (two) times daily for 14 days. 28 tablet 0    No current facility-administered medications for this visit.    Allergies as of 02/14/2022 - Review Complete 02/14/2022  Allergen Reaction Noted   Penicillins  08/17/2007   Sulfonamide derivatives  08/17/2007    Family History  Problem Relation Age of Onset   Diabetes Mother    Cancer Mother        liver--not clear if this was the primary   COPD Mother    Bipolar disorder Sister    Diabetes Sister    Schizophrenia Brother    Bipolar disorder Brother    Cancer Brother        Not sure of primary   Cancer Maternal Aunt        Breast cancer   Breast cancer Maternal Aunt        Cause of death   Cancer Maternal Aunt        pancreatic   Cancer Maternal Grandfather        throat   Colon cancer Neg Hx    Colon polyps Neg Hx    Esophageal cancer Neg Hx    Stomach cancer Neg Hx    Rectal cancer Neg Hx       Physical Exam: General:   Alert,  well-nourished, pleasant and cooperative in NAD Head:  Normocephalic and atraumatic. Eyes:  Sclera clear, no icterus.   Conjunctiva pink. Abdomen:  Soft, nontender, nondistended, normal bowel sounds, no rebound or guarding. No hepatosplenomegaly.   Neurologic:  Alert and  oriented x4;  grossly nonfocal Skin:  Intact without significant lesions or rashes. Psych:  Alert and cooperative. Normal mood and affect.     Euriah Matlack L. Tarri Glenn, MD, MPH 02/14/2022, 10:16 AM

## 2022-02-14 NOTE — Patient Instructions (Addendum)
It was my pleasure to provide care to you today. Based on our discussion, I am providing you with my recommendations below:  RECOMMENDATION(S):   Stool test for H pylori is due within the next 4 weeks.   Please let me know if your symptoms return.   I would like to see you at least annually if you are continuing to need pantoprazole.  Otherwise, you need a colonoscopy in 3 years.   FOLLOW UP:  I would like for you to follow up with me in one year or sooner if needed . Please call the office at [phone] to schedule your appointment.   BMI:  If you are age 55 or older, your body mass index should be between 23-30. Your There is no height or weight on file to calculate BMI. If this is out of the aforementioned range listed, please consider follow up with your Primary Care Provider.  If you are age 57 or younger, your body mass index should be between 19-25. Your There is no height or weight on file to calculate BMI. If this is out of the aformentioned range listed, please consider follow up with your Primary Care Provider.   MY CHART:  The [place] [facility] providers would like to encourage you to use [facility] to communicate with providers for non-urgent requests or questions.  Due to long hold times on the telephone, sending your provider a message by [facility] may be a faster and more efficient way to get a response.  Please allow 48 business hours for a response.  Please remember that this is for non-urgent requests.   Thank you for trusting me with your gastrointestinal care!    [name], MD, MPH

## 2022-03-12 ENCOUNTER — Other Ambulatory Visit: Payer: Medicaid Other

## 2022-03-13 ENCOUNTER — Other Ambulatory Visit (INDEPENDENT_AMBULATORY_CARE_PROVIDER_SITE_OTHER): Payer: Medicaid Other

## 2022-03-13 ENCOUNTER — Other Ambulatory Visit: Payer: Self-pay

## 2022-03-13 DIAGNOSIS — E785 Hyperlipidemia, unspecified: Secondary | ICD-10-CM

## 2022-03-13 DIAGNOSIS — E1169 Type 2 diabetes mellitus with other specified complication: Secondary | ICD-10-CM

## 2022-03-13 DIAGNOSIS — E119 Type 2 diabetes mellitus without complications: Secondary | ICD-10-CM | POA: Diagnosis not present

## 2022-03-14 ENCOUNTER — Ambulatory Visit: Payer: Medicaid Other | Admitting: Internal Medicine

## 2022-03-14 LAB — LIPID PANEL W/O CHOL/HDL RATIO
Cholesterol, Total: 196 mg/dL (ref 100–199)
HDL: 43 mg/dL (ref >39)
LDL Chol Calc (NIH): 124 mg/dL — ABNORMAL HIGH (ref 0–99)
Triglycerides: 164 mg/dL — ABNORMAL HIGH (ref 0–149)
VLDL Cholesterol Cal: 29 mg/dL (ref 5–40)

## 2022-03-14 LAB — HEMOGLOBIN A1C
Est. average glucose Bld gHb Est-mCnc: 128 mg/dL
Hgb A1c MFr Bld: 6.1 % — ABNORMAL HIGH (ref 4.8–5.6)

## 2022-03-26 ENCOUNTER — Ambulatory Visit (INDEPENDENT_AMBULATORY_CARE_PROVIDER_SITE_OTHER): Payer: Medicaid Other | Admitting: Clinical

## 2022-03-26 DIAGNOSIS — F3130 Bipolar disorder, current episode depressed, mild or moderate severity, unspecified: Secondary | ICD-10-CM | POA: Diagnosis not present

## 2022-03-31 ENCOUNTER — Encounter (HOSPITAL_COMMUNITY): Payer: Self-pay

## 2022-03-31 NOTE — Plan of Care (Signed)
Client is in agreement to the treatment plan. ?
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
## 2022-04-01 ENCOUNTER — Encounter: Payer: Self-pay | Admitting: Gastroenterology

## 2022-04-08 MED ORDER — ATORVASTATIN CALCIUM 20 MG PO TABS: ORAL_TABLET | ORAL | 11 refills | Status: DC

## 2022-04-08 NOTE — Addendum Note (Signed)
Addended by: Marcelino Duster on: 04/08/2022 01:09 PM ? ? Modules accepted: Orders ? ?

## 2022-04-18 ENCOUNTER — Ambulatory Visit (INDEPENDENT_AMBULATORY_CARE_PROVIDER_SITE_OTHER): Payer: Medicaid Other | Admitting: Psychiatry

## 2022-04-18 DIAGNOSIS — F3130 Bipolar disorder, current episode depressed, mild or moderate severity, unspecified: Secondary | ICD-10-CM | POA: Diagnosis not present

## 2022-04-18 MED ORDER — HYDROXYZINE PAMOATE 50 MG PO CAPS: 50.0000 mg | ORAL_CAPSULE | Freq: Three times a day (TID) | ORAL | 2 refills | Status: DC | PRN

## 2022-04-18 MED ORDER — QUETIAPINE FUMARATE 50 MG PO TABS: 50.0000 mg | ORAL_TABLET | Freq: Every day | ORAL | 2 refills | Status: DC

## 2022-04-18 MED ORDER — LAMOTRIGINE 100 MG PO TABS: 100.0000 mg | ORAL_TABLET | Freq: Every day | ORAL | 2 refills | Status: DC

## 2022-04-18 NOTE — Progress Notes (Signed)
BH MD/PA/NP OP Progress Note ? ?04/18/2022 11:11 AM ?[place] Blas  ?MRN:  [id number] ? ?Virtual Visit via Telephone Note ? ?I connected with [name] on 04/18/22 at 11:00 AM EDT by telephone and verified that I am speaking with the correct person using two identifiers. ? ?Location: ?Patient: home ?Provider: offsite ?  ?I discussed the limitations, risks, security and privacy concerns of performing an evaluation and management service by telephone and the availability of in person appointments. I also discussed with the patient that there may be a patient responsible charge related to this service. The patient expressed understanding and agreed to proceed. ? ? ? ?  ?I discussed the assessment and treatment plan with the patient. The patient was provided an opportunity to ask questions and all were answered. The patient agreed with the plan and demonstrated an understanding of the instructions. ?  ?The patient was advised to call back or seek an in-person evaluation if the symptoms worsen or if the condition fails to improve as anticipated. ? ?I provided 5 minutes of non-face-to-face time during this encounter. ? ? ?Franne Grip, NP  ? ?Chief Complaint: Medication management ? ?HPI: Cheyenne Fowler is a 56 year old female presents to [facility] behavioral health outpatient for follow-up psychiatric evaluation.  She has a psychiatric history of polysubstance use disorder and bipolar disorder most recent depressed.  Patient symptoms are managed with hydroxyzine 50 mg 3 times daily as needed for anxiety, Lamictal 100 mg daily and Seroquel 100 mg daily at bedtime.  Patient reports that she has been taking Seroquel 50 mg at bedtime due to increased sedation upon awakening the next morning after taking Seroquel 100 mg.  Patient request that Seroquel be decreased to 50 mg as she believes this is effective.  Patient reports medication effectiveness with hydroxyzine and Lamictal and those medications refilled at  current dosages.  Medication benefits versus risk discussed. ? ?Patient is alert and oriented x4, calm, pleasant and willing to engage.  She reports a good mood sleep and appetite.  Patient denies suicidal or homicidal ideations, paranoia, delusional thought, auditory or visual hallucinations. ? ?Visit Diagnosis:  ?  ICD-10-CM   ?1. Bipolar I disorder, most recent episode depressed (HCC)  F31.30 hydrOXYzine (VISTARIL) 50 MG capsule  ?  lamoTRIgine (LAMICTAL) 100 MG tablet  ?  QUEtiapine (SEROQUEL) 50 MG tablet  ?  ? ? ?Past Psychiatric History:  ? ? ? ?Past Medical History:  ?Past Medical History:  ?Diagnosis Date  ? Anemia   ? Bipolar disorder ([place])   ? Diabetes mellitus ([place]) 2022  ? type 2  ? Environmental and seasonal allergies 1968  ? GERD (gastroesophageal reflux disease)   ? Heart murmur   ? Hypertension   ? Upper GI bleed 01/15/2022  ?  ?Past Surgical History:  ?Procedure Laterality Date  ? BIOPSY  01/17/2022  ? Procedure: BIOPSY;  Surgeon: [name], MD;  Location: [facility] ENDOSCOPY;  Service: Gastroenterology;;  ? [place]  ? ESOPHAGOGASTRODUODENOSCOPY (EGD) WITH PROPOFOL N/A 01/17/2022  ? Procedure: ESOPHAGOGASTRODUODENOSCOPY (EGD) WITH PROPOFOL;  Surgeon: [name], MD;  Location: [place];  Service: Gastroenterology;  Laterality: N/A;  ? HERNIA REPAIR    ? Umbilical hernia in the 2nd grade  ? TUBAL LIGATION  1990  ? ? ?Family Psychiatric History:  ? ?Family History:  ?Family History  ?Problem Relation Age of Onset  ? Diabetes Mother   ? Cancer Mother   ?     liver--not  clear if this was the primary  ? COPD Mother   ? Bipolar disorder Sister   ? Diabetes Sister   ? Schizophrenia Brother   ? Bipolar disorder Brother   ? Cancer Brother   ?     Not sure of primary  ? Cancer Maternal Aunt   ?     Breast cancer  ? Breast cancer Maternal Aunt   ?     Cause of death  ? Cancer Maternal Aunt   ?     pancreatic  ? Cancer Maternal Grandfather   ?     throat  ? Colon cancer Neg  Hx   ? Colon polyps Neg Hx   ? Esophageal cancer Neg Hx   ? Stomach cancer Neg Hx   ? Rectal cancer Neg Hx   ? ? ?Social History:  ?Social History  ? ?Socioeconomic History  ? Marital status: Married  ?  Spouse name: [name]  ? Number of children: 3  ? Years of education: Not on file  ? Highest education level: GED or equivalent  ?Occupational History  ? 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 Smoking status: Every Day  ?  Packs/day: 0.25  ?  Years: 29.00  ?  Pack years: 7.25  ?  Types: Cigarettes  ? Smokeless tobacco: Never  ?Vaping Use  ? Vaping Use: Never used  ?Substance and Sexual Activity  ? Alcohol use: Not Currently  ?  Comment: Last alcoholic drink was 05/19/40, before went into hospital.  Was drinking every day prior.  Was drinking due to stress.She and husband drink every evening.  Her counselor, Page, at [employer] is aware of her drinking.  ? Drug use: Not Currently  ?  Types: Marijuana, "Crack" cocaine  ?  Comment: Last cocaine use was 30 days ago.  No MJ for 2 months.  ? Sexual activity: Yes  ?  Birth control/protection: Surgical  ?Other Topics Concern  ? Not on file  ?Social History Narrative  ? She and her husband live with her son and her granddaughter.   ? Born and raised in [place]   ? ?Social Determinants of Health  ? ?Financial Resource Strain: Low Risk   ? Difficulty of Paying Living Expenses: Not very hard  ?Food Insecurity: No Food Insecurity  ? Worried About Charity fundraiser in the Last Year: Never true  ? Ran Out of Food in the Last Year: Never true  ?Transportation Needs: No Transportation Needs  ? Lack of Transportation (Medical): No  ? Lack of Transportation (Non-Medical): No  ?Physical Activity: Not on file  ?Stress: Not on file  ?Social Connections: Not on file  ? ? ?Allergies:  ?Allergies  ?Allergen Reactions  ? Penicillins   ?  REACTION: hives  ? Sulfonamide Derivatives   ?  REACTION: hives  ? ? ?Metabolic Disorder Labs: ?Lab Results   ?Component Value Date  ? HGBA1C 6.1 (H) 03/13/2022  ? MPG 119.76 07/03/2018  ? MPG 137 (H) 07/14/2012  ? ?No results found for: PROLACTIN ?Lab Results  ?Component Value Date  ? CHOL 196 03/13/2022  ? TRIG 164 (H) 03/13/2022  ? HDL 43 03/13/2022  ? CHOLHDL 4.3 07/03/2018  ? VLDL 31 07/03/2018  ? LDLCALC 124 (H) 03/13/2022  ? LDLCALC 117 (H) 11/22/2021  ? ?Lab Results  ?Component Value Date  ? TSH 0.828 05/23/2012  ? ? ?Therapeutic Level Labs: ?No results found for: LITHIUM ?No results found for:  VALPROATE ?No components found for:  CBMZ ? ?Current Medications: ?Current Outpatient Medications  ?Medication Sig Dispense Refill  ? atorvastatin (LIPITOR) 20 MG tablet 1 tab by mouth daily with evening meal. 30 tablet 11  ? cetirizine (ZYRTEC) 10 MG tablet Take 10 mg by mouth daily.    ? gabapentin (NEURONTIN) 100 MG capsule Take 1 capsule (100 mg total) by mouth at bedtime. 30 capsule 11  ? hydrOXYzine (VISTARIL) 50 MG capsule Take 1 capsule (50 mg total) by mouth 3 (three) times daily as needed. 90 capsule 2  ? lamoTRIgine (LAMICTAL) 100 MG tablet Take 1 tablet (100 mg total) by mouth daily. 30 tablet 2  ? losartan (COZAAR) 50 MG tablet Take 1 tablet (50 mg total) by mouth daily. 30 tablet 11  ? metFORMIN (GLUCOPHAGE-XR) 500 MG 24 hr tablet 1 tab by mouth twice daily with meals 60 tablet 11  ? pantoprazole (PROTONIX) 40 MG tablet Take 1 tablet (40 mg total) by mouth 2 (two) times daily. 90 tablet 3  ? QUEtiapine (SEROQUEL) 50 MG tablet Take 1 tablet (50 mg total) by mouth at bedtime. 30 tablet 2  ? ?No current facility-administered medications for this visit.  ? ? ? ?Musculoskeletal: ?Strength & Muscle Tone:  n/a virtual visit ?Gait & Station:  n/a ?Patient leans: N/A ? ?Psychiatric Specialty Exam: ?Review of Systems  ?Psychiatric/Behavioral:  Negative for hallucinations, self-injury and suicidal ideas.   ?All other systems reviewed and are negative.  ?Last menstrual period 06/09/2018.There is no height or weight on file  to calculate BMI.  ?General Appearance: NA  ?Eye Contact:  NA  ?Speech:  Clear and Coherent  ?Volume:  Normal  ?Mood: Euthymic  ?Affect:  NA  ?Thought Process:  Goal Directed  ?Orientation:  Full (Time, Pla

## 2022-05-02 ENCOUNTER — Ambulatory Visit (INDEPENDENT_AMBULATORY_CARE_PROVIDER_SITE_OTHER): Payer: Medicaid Other | Admitting: Clinical

## 2022-05-02 DIAGNOSIS — F3130 Bipolar disorder, current episode depressed, mild or moderate severity, unspecified: Secondary | ICD-10-CM
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
## 2022-05-03 NOTE — Plan of Care (Signed)
?  Problem: Depression CCP Problem  1  Goal: LTG: Leshia WILL SCORE LESS THAN 10 ON THE PATIENT HEALTH QUESTIONNAIRE (PHQ-9) Outcome: Progressing Goal: STG: Keeva WILL COMPLETE AT LEAST 80% OF ASSIGNED HOMEWORK Outcome: Progressing Goal: STG: Khristie WILL IDENTIFY 3 COGNITIVE PATTERNS AND BELIEFS THAT SUPPORT DEPRESSION Outcome: Progressing   

## 2022-05-21 ENCOUNTER — Other Ambulatory Visit (INDEPENDENT_AMBULATORY_CARE_PROVIDER_SITE_OTHER): Payer: Medicaid Other | Admitting: Internal Medicine

## 2022-05-21 DIAGNOSIS — E7800 Pure hypercholesterolemia, unspecified: Secondary | ICD-10-CM

## 2022-05-22 LAB — HEPATIC FUNCTION PANEL
ALT: 56 IU/L — ABNORMAL HIGH (ref 0–32)
AST: 32 IU/L (ref 0–40)
Albumin: 3.9 g/dL (ref 3.8–4.9)
Alkaline Phosphatase: 106 IU/L (ref 44–121)
Bilirubin Total: 0.2 mg/dL (ref 0.0–1.2)
Bilirubin, Direct: <0.1 mg/dL (ref 0.00–0.40)
Total Protein: 6.4 g/dL (ref 6.0–8.5)

## 2022-05-22 LAB — LIPID PANEL W/O CHOL/HDL RATIO
Cholesterol, Total: 174 mg/dL (ref 100–199)
HDL: 42 mg/dL (ref >39)
LDL Chol Calc (NIH): 110 mg/dL — ABNORMAL HIGH (ref 0–99)
Triglycerides: 123 mg/dL (ref 0–149)
VLDL Cholesterol Cal: 22 mg/dL (ref 5–40)

## 2022-06-13 ENCOUNTER — Ambulatory Visit (INDEPENDENT_AMBULATORY_CARE_PROVIDER_SITE_OTHER): Payer: Medicaid Other | Admitting: Clinical

## 2022-06-13 DIAGNOSIS — F3130 Bipolar disorder, current episode depressed, mild or moderate severity, unspecified: Secondary | ICD-10-CM | POA: Diagnosis not present

## 2022-06-21 ENCOUNTER — Encounter (HOSPITAL_COMMUNITY): Payer: Self-pay | Admitting: Psychiatry

## 2022-06-21 ENCOUNTER — Telehealth (INDEPENDENT_AMBULATORY_CARE_PROVIDER_SITE_OTHER): Payer: Medicaid Other | Admitting: Psychiatry

## 2022-06-21 DIAGNOSIS — F3130 Bipolar disorder, current episode depressed, mild or moderate severity, unspecified: Secondary | ICD-10-CM

## 2022-06-21 MED ORDER — QUETIAPINE FUMARATE 50 MG PO TABS: 50.0000 mg | ORAL_TABLET | Freq: Every day | ORAL | 3 refills | Status: DC

## 2022-06-21 MED ORDER — LAMOTRIGINE 100 MG PO TABS: 100.0000 mg | ORAL_TABLET | Freq: Every day | ORAL | 3 refills | Status: DC

## 2022-06-21 MED ORDER — HYDROXYZINE PAMOATE 50 MG PO CAPS: 50.0000 mg | ORAL_CAPSULE | Freq: Three times a day (TID) | ORAL | 3 refills | Status: DC | PRN
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
## 2022-07-15 ENCOUNTER — Telehealth (HOSPITAL_COMMUNITY): Payer: Medicaid Other | Admitting: Psychiatry

## 2022-07-15 ENCOUNTER — Encounter (HOSPITAL_COMMUNITY): Payer: Self-pay

## 2022-07-15 ENCOUNTER — Ambulatory Visit (HOSPITAL_COMMUNITY): Payer: Medicaid Other | Admitting: Clinical

## 2022-08-07 ENCOUNTER — Ambulatory Visit (INDEPENDENT_AMBULATORY_CARE_PROVIDER_SITE_OTHER): Payer: Medicaid Other | Admitting: Clinical

## 2022-08-07 DIAGNOSIS — F3130 Bipolar disorder, current episode depressed, mild or moderate severity, unspecified: Secondary | ICD-10-CM | POA: Diagnosis not present

## 2022-08-07 NOTE — Plan of Care (Signed)
  Problem: Depression CCP Problem  1  Goal: LTG: [place] WILL SCORE LESS THAN 10 ON THE PATIENT HEALTH QUESTIONNAIRE (PHQ-9) Outcome: Progressing Goal: STG: [place] WILL COMPLETE AT LEAST 80% OF ASSIGNED HOMEWORK Outcome: Progressing Goal: STG: [place] WILL IDENTIFY 3 COGNITIVE PATTERNS AND BELIEFS THAT SUPPORT DEPRESSION Outcome: Progressing

## 2022-08-07 NOTE — Progress Notes (Signed)
THERAPIST PROGRESS NOTE Virtual Visit via Telephone Note  I connected with Cheyenne Fowler on 08/07/22 at  1:00 PM EDT by telephone and verified that I am speaking with the correct person using two identifiers.  Location: Patient: home Provider: office   I discussed the limitations, risks, security and privacy concerns of performing an evaluation and management service by telephone and the availability of in person appointments. I also discussed with the patient that there may be a patient responsible charge related to this service. The patient expressed understanding and agreed to proceed.   Follow Up Instructions: I discussed the assessment and treatment plan with the patient. The patient was provided an opportunity to ask questions and all were answered. The patient agreed with the plan and demonstrated an understanding of the instructions.   The patient was advised to call back or seek an in-person evaluation if the symptoms worsen or if the condition fails to improve as anticipated.   Session Time: 40 minutes  Participation Level: Active  Behavioral Response: NAAlertAnxious  Type of Therapy: Individual Therapy  Treatment Goals addressed: Client will identify 3 cognitive patterns and beliefs that support depression  ProgressTowards Goals: Progressing  Interventions: CBT and Supportive  Summary:  Cheyenne Fowler is a 56 y.o. female who presents for the scheduled appointment oriented x5 and friendly.  Client denied hallucinations and delusions. Client reported she has been feeling very stressed out. Client reported she has had changes in her housing situation.  Client reported she along with her husband and her brother were evicted from their previous apartment due to being in management taking over the facility and miscommunication with help and should be paid.  Client reported they had to rush to get everything or as much as they could move out of the apartment and are  keeping some things temporarily at her daughter's house.  Client reported she her husband and her brother have been moving back and forth between motels's. Client reported she takes care of her brother who is diagnosed with schizophrenia. Client reported there is nobody else to take care of him like she does.  Client reported she also has been very worried about her youngest son. Client reported he has been in and out of rehab and relapsing using illicit substances.  Client reported he has also been stealing from the family and lying.  Client reported she is trying to take him to get help but he does not stick with treatment.  Client reported she empathized with him because that was her lifestyle at one point in time but she made a decision to get clean. Client reported she has made a decision to not give him money when he asks for it and/or allow him to stay with her. Evidence of progress towards goal:  Client reported she is practicing 1 skill of enforcing boundaries.  Client reported having the innate belief of doing everything she can to help others but knowing that comes with a limit.   Suicidal/Homicidal: Nowithout intent/plan  Therapist Response:  Therapist began the appointment asking the client how she has been doing since last seen. Therapist used CBT to engage using active listening and positive emotional support. Therapist used CBT to engage the client to allow her time to discuss her worries and stress pertaining to changes in her home and family dynamic. Therapist used CBT to empathize with the client. Therapist used CBT to engage the client to discuss stress management and the appropriate use of boundaries. Therapist  used CBT ask the client to identify her progress with frequency of use with coping skills with continued practice in her daily activity.    Therapist assigned client homework to focus on prioritizing her needs before using all her time to address other things. Client was  scheduled for next appointment.    Plan: Return again in 3 weeks.  Diagnosis: Bipolar 1 disorder, most recent episode depressed  Collaboration of Care: Patient refused AEB none requested by the client at this time.  Patient/Guardian was advised Release of Information must be obtained prior to any record release in order to collaborate their care with an outside provider. Patient/Guardian was advised if they have not already done so to contact the registration department to sign all necessary forms in order for [place] to release information regarding their care.   Consent: Patient/Guardian gives verbal consent for treatment and assignment of benefits for services provided during this visit. Patient/Guardian expressed understanding and agreed to proceed.   [place], LCSW 08/07/2022

## 2022-08-14 ENCOUNTER — Ambulatory Visit (INDEPENDENT_AMBULATORY_CARE_PROVIDER_SITE_OTHER): Payer: Medicaid Other | Admitting: Clinical

## 2022-08-14 DIAGNOSIS — F3130 Bipolar disorder, current episode depressed, mild or moderate severity, unspecified: Secondary | ICD-10-CM

## 2022-08-14 NOTE — Progress Notes (Signed)
THERAPIST PROGRESS NOTE Virtual Visit via Telephone Note  I connected with Cheyenne Fowler on 08/14/22 at  1:00 PM EDT by telephone and verified that I am speaking with the correct person using two identifiers.  Location: Patient: home Provider: office   I discussed the limitations, risks, security and privacy concerns of performing an evaluation and management service by telephone and the availability of in person appointments. I also discussed with the patient that there may be a patient responsible charge related to this service. The patient expressed understanding and agreed to proceed.   Follow Up Instructions: I discussed the assessment and treatment plan with the patient. The patient was provided an opportunity to ask questions and all were answered. The patient agreed with the plan and demonstrated an understanding of the instructions.   The patient was advised to call back or seek an in-person evaluation if the symptoms worsen or if the condition fails to improve as anticipated.   Session Time: 30 minutes  Participation Level: Active  Behavioral Response: CasualAlertAnxious  Type of Therapy: Individual Therapy  Treatment Goals addressed: Client will identify 3 cognitive patterns and believes that support depression  ProgressTowards Goals: Progressing  Interventions: CBT and Supportive  Summary:  Cheyenne Fowler is a 56 y.o. female who presents for the scheduled appointment oriented times five, appropriately dressed, and friendly. Client denied hallucinations and delusions. Client reported on today feeling overwhelmed. Client reported she has been worried about her son since he has tried to seek treatment for substance abuse. Client reported she took him to detox before he went to residential rehab. Client reported they arrived too late so he came back home. Client reported he stayed with his dad at a hotel and' \\his'$  dad had a history of substance abuse. Client reported  [place] now [place]. Client reported it is hard to see him walking the streets but she is not able to help him.  Client reported her brother who she was looking after was able to be placed in a group home with his benefits which was a huge relief for her.  Client reported she and her husband are staying at a motel until they are able to move into new housing.  Client reported her husband is doing odd in jobs to help pay for the motel room.  Client reported she was not able to recover all of her friendships from her previous place and will need to find a portable furniture that she continues to finish the new place.  Client reported she is also worried about her daughter and grandkids because they have also been displaced from the house they were renting. Evidence of progress towards goal:  client reported utilizing 1 positive coping skills 7 days out of the week which she is using boundaries and working toward her personal goals.  Client reported one of her cognitive patterns that contributed to depression is feeling that she had to fix everything for everybody.   Suicidal/Homicidal: Nowithout intent/plan  Therapist Response:  Therapist began the appointment asking the client how she has been doing since last seen. Therapist used CBT to utilize active listening and positive emotional support. Therapist used CBT to engage the client and ask her to describe stressors within her family that negatively impact her mood and steps she is taking the problem solve and alleviate stress. Therapist used CBT to reinforce the clients positive thought processing and brainstorm solutions she is trying. Therapist used CBT ask the  client to identify her progress with frequency of use with coping skills with continued practice in her daily activity.    Therapist assigned client homework to continue practicing self-care and taking steps to work on her short-term goals. Client was scheduled for next  appointment.    Plan: Return again in 5 weeks.  Diagnosis: Bipolar 1 disorder, most recent episode depressed  Collaboration of Care: Patient refused AEB none requested by the client at this time.  Patient/Guardian was advised Release of Information must be obtained prior to any record release in order to collaborate their care with an outside provider. Patient/Guardian was advised if they have not already done so to contact the registration department to sign all necessary forms in order for [place] to release information regarding their care.   Consent: Patient/Guardian gives verbal consent for treatment and assignment of benefits for services provided during this visit. Patient/Guardian expressed understanding and agreed to proceed.   [place], LCSW 08/14/2022

## 2022-08-14 NOTE — Plan of Care (Signed)
  Problem: Depression CCP Problem  1  Goal: LTG: [place] WILL SCORE LESS THAN 10 ON THE PATIENT HEALTH QUESTIONNAIRE (PHQ-9) Outcome: Progressing Goal: STG: [place] WILL COMPLETE AT LEAST 80% OF ASSIGNED HOMEWORK Outcome: Progressing Goal: STG: [place] WILL IDENTIFY 3 COGNITIVE PATTERNS AND BELIEFS THAT SUPPORT DEPRESSION Outcome: Progressing

## 2022-08-28 ENCOUNTER — Emergency Department (HOSPITAL_COMMUNITY): Payer: Medicaid Other

## 2022-08-28 ENCOUNTER — Emergency Department (HOSPITAL_COMMUNITY)
Admission: EM | Admit: 2022-08-28 | Discharge: 2022-08-28 | Disposition: A | Discharge status: Home / Self Care | Payer: Medicaid Other | Attending: Emergency Medicine | Admitting: Emergency Medicine

## 2022-08-28 ENCOUNTER — Encounter (HOSPITAL_COMMUNITY): Payer: Self-pay | Admitting: Emergency Medicine

## 2022-08-28 DIAGNOSIS — S199XXA Unspecified injury of neck, initial encounter: Secondary | ICD-10-CM | POA: Diagnosis present

## 2022-08-28 DIAGNOSIS — Y92410 Unspecified street and highway as the place of occurrence of the external cause: Secondary | ICD-10-CM | POA: Diagnosis not present

## 2022-08-28 DIAGNOSIS — S161XXA Strain of muscle, fascia and tendon at neck level, initial encounter: Secondary | ICD-10-CM | POA: Diagnosis not present

## 2022-08-28 MED ORDER — OXYCODONE-ACETAMINOPHEN 5-325 MG PO TABS
2.0000 | ORAL_TABLET | Freq: Once | ORAL | Status: AC
  Administered 2022-08-28: 2 via ORAL
  Filled 2022-08-28: qty 2

## 2022-08-28 MED ORDER — IBUPROFEN 800 MG PO TABS
800.0000 mg | ORAL_TABLET | Freq: Once | ORAL | Status: AC
  Administered 2022-08-28: 800 mg via ORAL
  Filled 2022-08-28: qty 1
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
## 2022-08-28 NOTE — Discharge Instructions (Addendum)
No bones appear to be broken in your neck or back You can use acetaminophen and ibuprofen for pain Please follow-up with your doctor if symptoms of not resolved return if you are having any worsening problems

## 2022-08-28 NOTE — ED Notes (Signed)
C-collar placed on patient.

## 2022-09-04 ENCOUNTER — Ambulatory Visit (HOSPITAL_COMMUNITY): Payer: Medicaid Other | Admitting: Clinical

## 2022-09-23 ENCOUNTER — Encounter (HOSPITAL_COMMUNITY): Payer: Self-pay | Admitting: Psychiatry

## 2022-09-23 ENCOUNTER — Telehealth (INDEPENDENT_AMBULATORY_CARE_PROVIDER_SITE_OTHER): Payer: Medicaid Other | Admitting: Psychiatry

## 2022-09-23 DIAGNOSIS — F3130 Bipolar disorder, current episode depressed, mild or moderate severity, unspecified: Secondary | ICD-10-CM | POA: Diagnosis not present

## 2022-09-23 MED ORDER — QUETIAPINE FUMARATE 50 MG PO TABS: 50.0000 mg | ORAL_TABLET | Freq: Every day | ORAL | 3 refills | Status: DC

## 2022-09-23 MED ORDER — LAMOTRIGINE 100 MG PO TABS: 100.0000 mg | ORAL_TABLET | Freq: Every day | ORAL | 3 refills | Status: DC

## 2022-09-23 MED ORDER — HYDROXYZINE PAMOATE 50 MG PO CAPS: 50.0000 mg | ORAL_CAPSULE | Freq: Three times a day (TID) | ORAL | 3 refills | Status: DC | PRN
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
## 2022-09-25 ENCOUNTER — Telehealth (HOSPITAL_COMMUNITY): Payer: Medicaid Other | Admitting: Psychiatry

## 2022-11-25 ENCOUNTER — Ambulatory Visit (HOSPITAL_COMMUNITY): Payer: Medicaid Other | Admitting: Clinical

## 2022-12-02 IMAGING — MG MM DIGITAL SCREENING BILAT W/ TOMO AND CAD
8 series · 8 of 24 positions shown · non-contrast
Comparison: Previous exam(s).

CLINICAL DATA: Screening.

EXAM:
DIGITAL SCREENING BILATERAL MAMMOGRAM WITH TOMOSYNTHESIS AND CAD
TECHNIQUE: Bilateral screening digital craniocaudal and mediolateral oblique
mammograms were obtained. Bilateral screening digital breast
tomosynthesis was performed. The images were evaluated with
computer-aided detection.

[R MLO synth-2D]
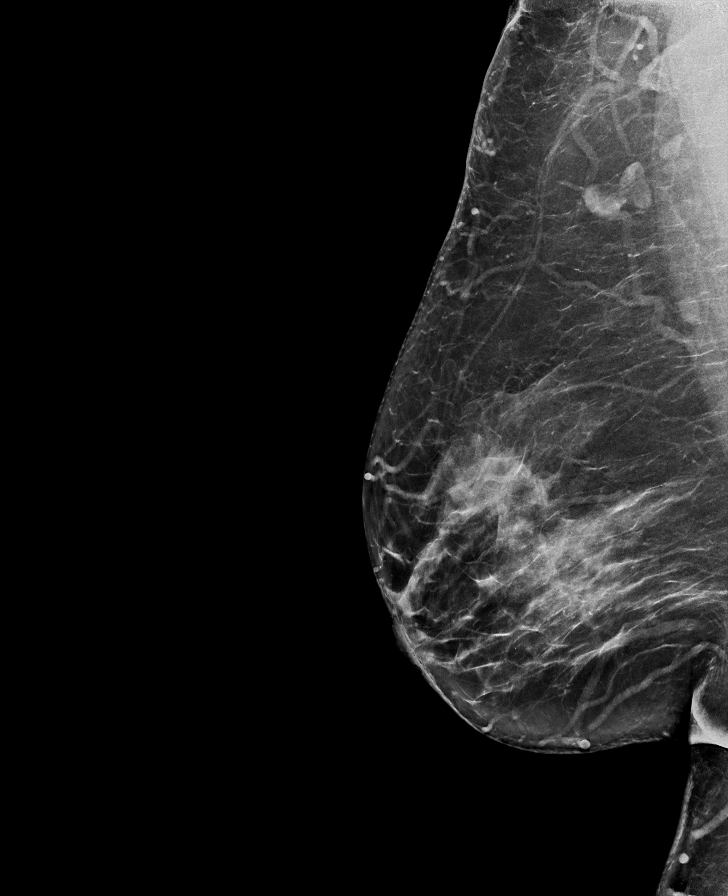

[L CC synth-2D]
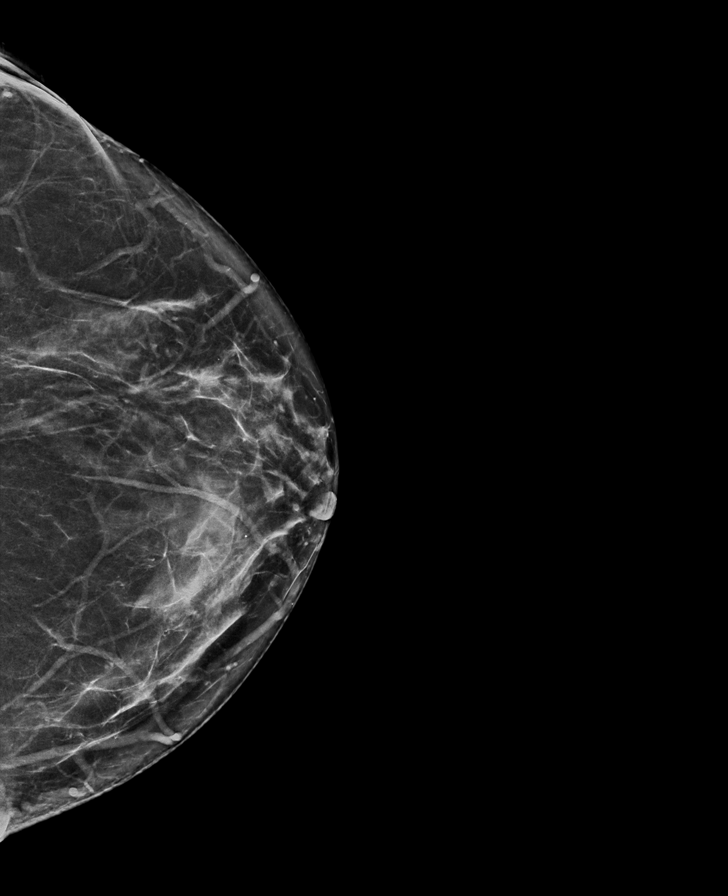

[R CC synth-2D]
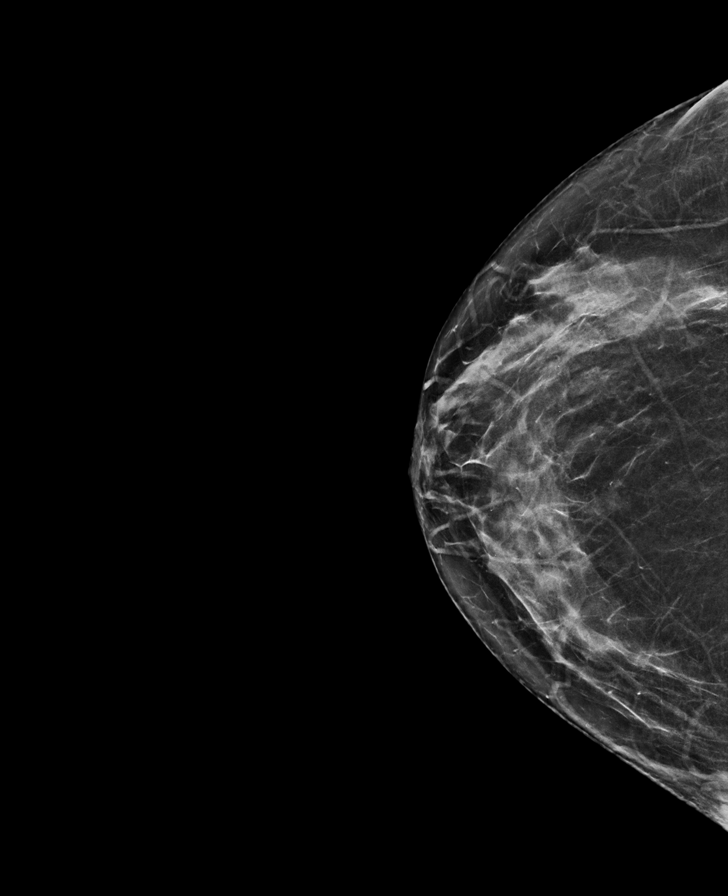

[L MLO synth-2D]
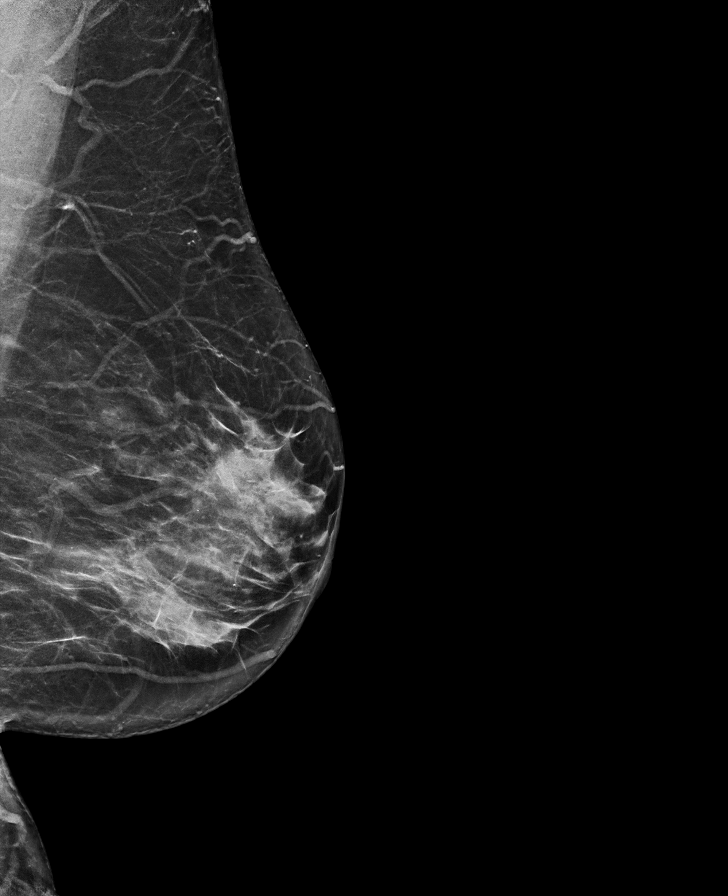

[L MLO tomo · tomo slice 37/74.0]
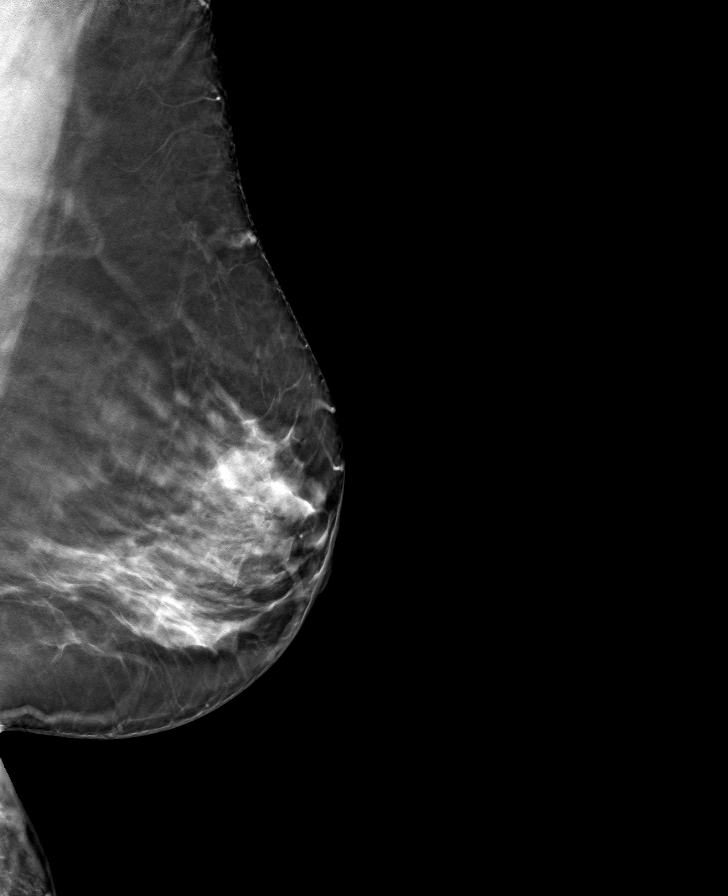

[L CC tomo · tomo slice 35/69.0]
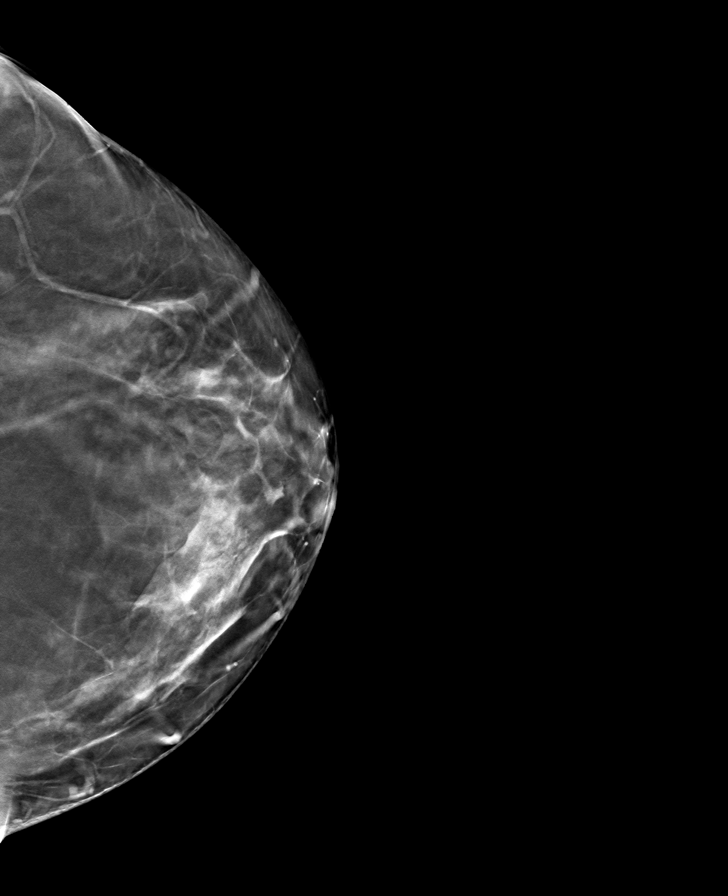

[R MLO tomo · tomo slice 41/80.0]
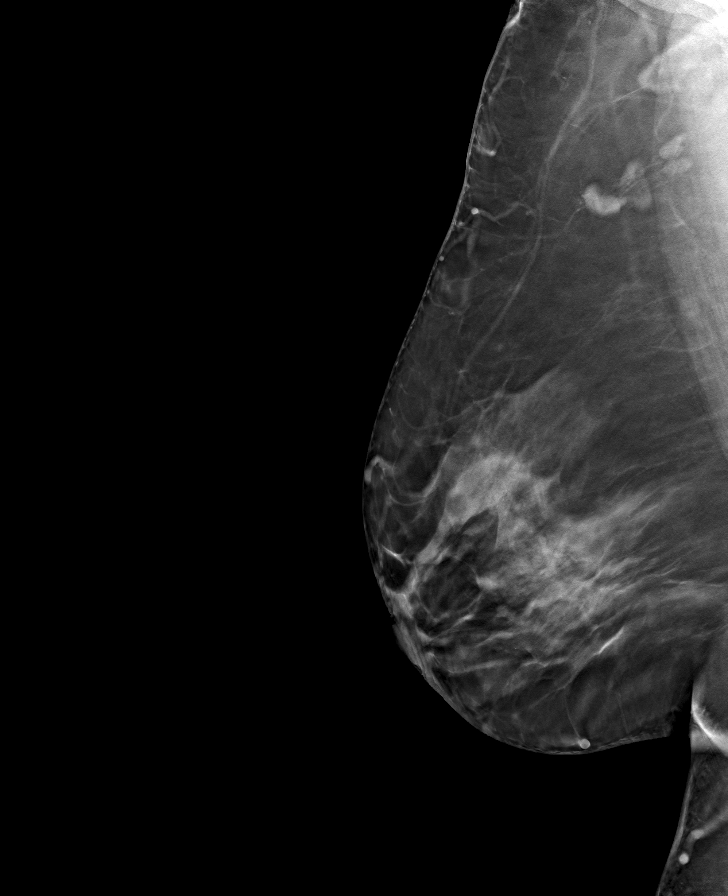

[R CC tomo · tomo slice 33/65.0]
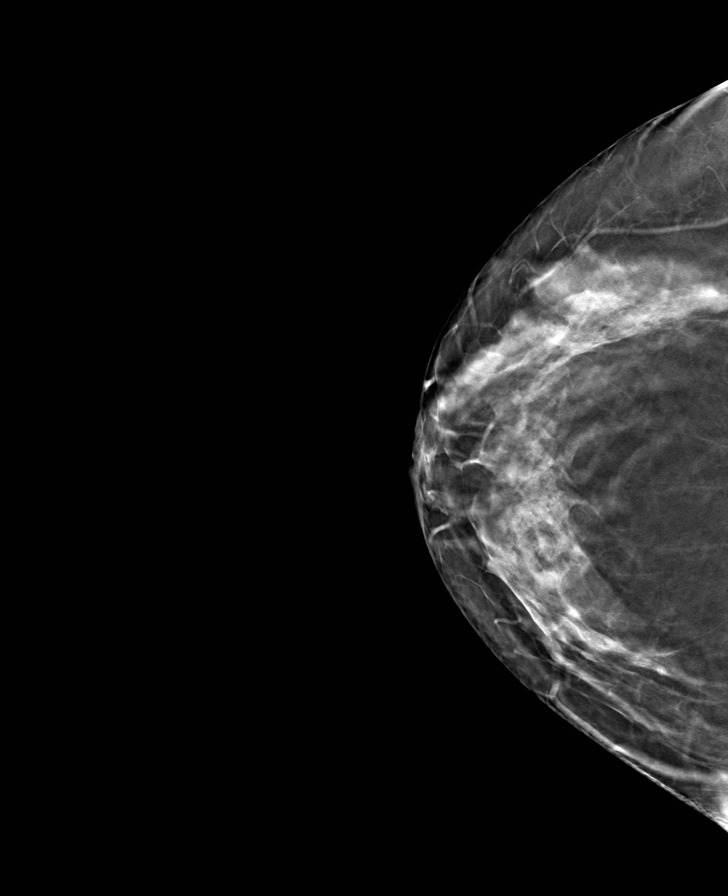

[8 of 24 positions shown; findings below may reference images not displayed]

ACR Breast Density Category c: The breast tissue is heterogeneously
dense, which may obscure small masses.
FINDINGS: There are no findings suspicious for malignancy.
IMPRESSION: No mammographic evidence of malignancy. A result letter of this
screening mammogram will be mailed directly to the patient.

RECOMMENDATION:
Screening mammogram in one year. (Code:Q3-W-BC3)

BI-RADS CATEGORY  1: Negative.

## 2022-12-04 ENCOUNTER — Other Ambulatory Visit: Payer: Self-pay | Admitting: Internal Medicine

## 2022-12-13 ENCOUNTER — Encounter (HOSPITAL_COMMUNITY): Payer: Self-pay

## 2022-12-13 ENCOUNTER — Telehealth (HOSPITAL_COMMUNITY): Payer: Medicaid Other | Admitting: Student in an Organized Health Care Education/Training Program

## 2023-01-15 ENCOUNTER — Encounter (HOSPITAL_COMMUNITY): Payer: Self-pay | Admitting: Psychiatry

## 2023-01-15 ENCOUNTER — Encounter (HOSPITAL_COMMUNITY): Payer: Self-pay

## 2023-01-15 ENCOUNTER — Telehealth (INDEPENDENT_AMBULATORY_CARE_PROVIDER_SITE_OTHER): Payer: Medicaid Other | Admitting: Psychiatry

## 2023-01-15 DIAGNOSIS — F3130 Bipolar disorder, current episode depressed, mild or moderate severity, unspecified: Secondary | ICD-10-CM | POA: Diagnosis not present

## 2023-01-15 MED ORDER — QUETIAPINE FUMARATE 100 MG PO TABS: 100.0000 mg | ORAL_TABLET | Freq: Every day | ORAL | 3 refills | Status: DC

## 2023-01-15 MED ORDER — HYDROXYZINE PAMOATE 50 MG PO CAPS: 50.0000 mg | ORAL_CAPSULE | Freq: Three times a day (TID) | ORAL | 3 refills | Status: DC | PRN

## 2023-01-15 MED ORDER — LAMOTRIGINE 150 MG PO TABS: 150.0000 mg | ORAL_TABLET | Freq: Every day | ORAL | 3 refills | Status: DC
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
## 2023-01-30 ENCOUNTER — Other Ambulatory Visit: Payer: Self-pay

## 2023-01-30 MED ORDER — LOSARTAN POTASSIUM 50 MG PO TABS: 50.0000 mg | ORAL_TABLET | Freq: Every day | ORAL | 11 refills | Status: DC

## 2023-01-30 MED ORDER — METFORMIN HCL ER 500 MG PO TB24: ORAL_TABLET | ORAL | 3 refills | Status: DC

## 2023-02-17 ENCOUNTER — Ambulatory Visit (INDEPENDENT_AMBULATORY_CARE_PROVIDER_SITE_OTHER): Payer: Medicaid Other | Admitting: Clinical

## 2023-02-17 DIAGNOSIS — F3130 Bipolar disorder, current episode depressed, mild or moderate severity, unspecified: Secondary | ICD-10-CM

## 2023-02-23 ENCOUNTER — Ambulatory Visit
Admission: EM | Admit: 2023-02-23 | Discharge: 2023-02-23 | Disposition: A | Discharge status: Home / Self Care | Payer: Medicaid Other | Attending: Physician Assistant | Admitting: Physician Assistant

## 2023-02-23 ENCOUNTER — Other Ambulatory Visit: Payer: Self-pay

## 2023-02-23 ENCOUNTER — Encounter: Payer: Self-pay | Admitting: Emergency Medicine

## 2023-02-23 DIAGNOSIS — K047 Periapical abscess without sinus: Secondary | ICD-10-CM

## 2023-02-23 DIAGNOSIS — K0889 Other specified disorders of teeth and supporting structures: Secondary | ICD-10-CM | POA: Diagnosis not present

## 2023-02-23 DIAGNOSIS — R112 Nausea with vomiting, unspecified: Secondary | ICD-10-CM | POA: Diagnosis not present

## 2023-02-23 MED ORDER — CLINDAMYCIN HCL 300 MG PO CAPS: 300.0000 mg | ORAL_CAPSULE | Freq: Three times a day (TID) | ORAL | 0 refills | Status: DC

## 2023-02-23 MED ORDER — ONDANSETRON HCL 4 MG PO TABS: 4.0000 mg | ORAL_TABLET | Freq: Three times a day (TID) | ORAL | 0 refills | Status: DC | PRN

## 2023-02-23 MED ORDER — IBUPROFEN 600 MG PO TABS: 600.0000 mg | ORAL_TABLET | Freq: Four times a day (QID) | ORAL | 0 refills | Status: DC | PRN

## 2023-02-23 MED ORDER — ONDANSETRON 4 MG PO TBDP
4.0000 mg | ORAL_TABLET | Freq: Once | ORAL | Status: AC
  Administered 2023-02-23: 4 mg via ORAL

## 2023-02-23 NOTE — ED Triage Notes (Signed)
Pt here for left sided dental pain x 5 days with pain and swelling

## 2023-02-23 NOTE — ED Provider Notes (Signed)
[facility]    CSN: [id number] Arrival date & time: 02/23/23  1106      History   Chief Complaint Chief Complaint  Patient presents with   Dental Pain    HPI Cheyenne Fowler is a 57 y.o. female.   57 year old female with left facial swelling and dental pain.  Patient indicates for the past 5 days she has been having increasing left upper dental pain, tenderness, facial swelling.  She indicates that the left side of the face is swollen with mild redness associated.  She indicates taking OTC medication without relief of her symptoms.  She indicates she does not have a dentist that she can call.  She indicates she is willing to see a dentist as long as they take Medicaid.  She is tolerating fluids well.   Dental Pain   Past Medical History:  Diagnosis Date   Anemia    Bipolar disorder ([place])    Diabetes mellitus ([place]) 2022   type 2   Environmental and seasonal allergies 1968   GERD (gastroesophageal reflux disease)    Heart murmur    Hypertension    Upper GI bleed 01/15/2022    Patient Active Problem List   Diagnosis Date Noted   Primary hypertension 01/23/2022   Acute esophagitis    Gastritis and gastroduodenitis    Hematemesis 01/16/2022   Type 2 diabetes mellitus ([place]) 01/15/2022   Hypertension associated with diabetes (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    Class: Acute   Polysubstance (excluding opioids) dependence  ([place]) 05/22/2012    Class: Acute   Breast tenderness in female 03/18/2012   Health care maintenance 03/18/2012   DEPRESSION 08/17/2007    Past Surgical History:  Procedure Laterality Date   BIOPSY  01/17/2022   Procedure: BIOPSY;  Surgeon: [name], MD;  Location: [place];  Service: Gastroenterology;;   [place]   ESOPHAGOGASTRODUODENOSCOPY (EGD) WITH PROPOFOL N/A 01/17/2022   Procedure: ESOPHAGOGASTRODUODENOSCOPY (EGD) WITH PROPOFOL;  Surgeon: [name], MD;  Location: [place];  Service: Gastroenterology;  Laterality: N/A;   HERNIA REPAIR     Umbilical hernia in the 2nd grade   TUBAL LIGATION  1990    OB History     Gravida  3   Para  3   Term  3   Preterm      AB  0   Living  3      SAB      IAB      Ectopic      Multiple      Live Births               Home Medications    Prior to Admission medications   Medication Sig Start Date End Date Taking? Authorizing Provider  clindamycin (CLEOCIN) 300 MG capsule Take 1 capsule (300 mg total) by mouth 3 (three) times daily. 02/23/23  Yes Nyoka Lint, PA-C  ibuprofen (ADVIL) 600 MG tablet Take  1 tablet (600 mg total) by mouth every 6 (six) hours as needed. 02/23/23  Yes Nyoka Lint, PA-C  ondansetron (ZOFRAN) 4 MG tablet Take 1 tablet (4 mg total) by mouth every 8 (eight) hours as needed for nausea or vomiting. 02/23/23  Yes Nyoka Lint, PA-C  atorvastatin (LIPITOR) 20 MG tablet 1 tab by mouth daily with evening meal. 04/08/22   Mack Hook, MD  cetirizine (ZYRTEC) 10 MG tablet Take 10 mg by mouth daily.    [provider]  gabapentin (NEURONTIN) 100 MG capsule Take 1 capsule (100 mg total) by mouth at bedtime. 01/18/22   Barb Merino, MD  hydrOXYzine (VISTARIL) 50 MG capsule Take 1 capsule (50 mg total) by mouth 3 (three) times daily as needed. 01/15/23   Salley Slaughter, NP  lamoTRIgine (LAMICTAL) 150 MG tablet Take 1 tablet (150 mg total) by  mouth daily. 01/15/23   Salley Slaughter, NP  losartan (COZAAR) 50 MG tablet Take 1 tablet (50 mg total) by mouth daily. 01/30/23   Mack Hook, MD  metFORMIN (GLUCOPHAGE-XR) 500 MG 24 hr tablet TAKE 1 TABLET BY MOUTH TWICE DAILY WITH MEALS 01/30/23   Mack Hook, MD  pantoprazole (PROTONIX) 40 MG tablet Take 1 tablet (40 mg total) by mouth 2 (two) times daily. 02/14/22   [name], MD  QUEtiapine (SEROQUEL) 100 MG tablet Take 1 tablet (100 mg total) by mouth at bedtime. 01/15/23   Salley Slaughter, NP    Family History Family History  Problem Relation Age of Onset   Diabetes Mother    Cancer Mother        liver--not clear if this was the primary   COPD Mother    Bipolar disorder Sister    Diabetes Sister    Schizophrenia Brother    Bipolar disorder Brother    Cancer Brother        Not sure of primary   Cancer Maternal Aunt        Breast cancer   Breast cancer Maternal Aunt        Cause of death   Cancer Maternal Aunt        pancreatic   Cancer Maternal Grandfather        throat   Colon cancer Neg Hx    Colon polyps Neg Hx    Esophageal cancer Neg Hx    Stomach cancer Neg Hx    Rectal cancer Neg Hx     Social History Social History   Tobacco Use   Smoking status: Every Day    Packs/day: 0.25    Years: 29.00    Total pack years: 7.25    Types: Cigarettes   Smokeless tobacco: Never  Vaping Use   Vaping Use: Never used  Substance Use Topics   Alcohol use: Not Currently    Comment: Last alcoholic drink was [id number], before went into hospital.  Was drinking every day prior.  Was drinking due to stress.She and husband drink every evening.  Her counselor, Page, at [employer] is aware of her drinking.   Drug use: Not Currently    Types: Marijuana, "Crack" cocaine    Comment: Last cocaine use was 30 days ago.  No MJ for 2 months.     Allergies   Penicillins and Sulfonamide derivatives   Review of Systems Review of Systems  HENT:   Positive for dental problem (left upper tooth with swelling of cheek).      Physical Exam Triage Vital Signs ED Triage  Vitals [02/23/23 1158]  Enc Vitals Group     BP 113/81     Pulse Rate 96     Resp 16     Temp 98.7 F (37.1 C)     Temp Source Oral     SpO2 98 %     Weight      Height      Head Circumference      Peak Flow      Pain Score 8     Pain Loc      Pain Edu?      Excl. in [place]?    No data found.  Updated Vital Signs BP 113/81 (BP Location: Left Arm)   Pulse 96   Temp 98.7 F (37.1 C) (Oral)   Resp 16   LMP 06/09/2018 (Exact Date) Comment: Bleeding one month ago.  SpO2 98%   Visual Acuity Right Eye Distance:   Left Eye Distance:   Bilateral Distance:    Right Eye Near:   Left Eye Near:    Bilateral Near:     Physical Exam Constitutional:      Appearance: Normal appearance.  HENT:     Right Ear: Hearing and tympanic membrane normal.     Left Ear: Hearing and tympanic membrane normal.     Mouth/Throat:     Mouth: Mucous membranes are moist.     Pharynx: Oropharynx is clear.      Comments: Facial: There is 1+ swelling which is present on the left cheek area with tenderness on palpation.  There is mild redness present without streaking. Cardiovascular:     Rate and Rhythm: Normal rate and regular rhythm.     Heart sounds: Normal heart sounds.  Pulmonary:     Effort: Pulmonary effort is normal.     Breath sounds: Normal breath sounds and air entry. No wheezing, rhonchi or rales.  Lymphadenopathy:     Cervical: No cervical adenopathy.  Neurological:     Mental Status: She is alert.      UC Treatments / Results  Labs (all labs ordered are listed, but only abnormal results are displayed) Labs Reviewed - No data to display  EKG   Radiology No results found.  Procedures Procedures (including critical care time)  Medications Ordered in UC Medications  ondansetron (ZOFRAN-ODT) disintegrating tablet 4 mg (4 mg Oral Given 02/23/23 1230)     Initial Impression / Assessment and Plan / UC Course  I have reviewed the triage vital signs and the nursing notes.  Pertinent labs & imaging results that were available during my care of the patient were reviewed by me and considered in my medical decision making (see chart for details).    Plan: The diagnosis will be treated with the following: 1.  Dental abscess: A.  Clindamycin 300 mg every 8 hours to treat infection. 2.  Dental pain: A.  Ibuprofen 600 mg every 6 hours with food to treat discomfort. 3.  Nausea and vomiting: A.  Zofran 4 mg given in office today. B.  Zofran 4 mg every 6 hours to help decrease nausea and stomach cramping. 4.  Advised to follow-up with dentist, resource sheet given. 5.  Return to urgent care as needed. Final Clinical Impressions(s) / UC Diagnoses   Final diagnoses:  Dental abscess  Pain, dental  Nausea and vomiting, unspecified vomiting type     Discharge Instructions      Advised take Zofran 4 mg every 6 hours as needed for nausea and  stomach cramping. Advised to take the clindamycin 300 mg, 1 3 times a day until completed to treat the infection. Advised take ibuprofen 600 mg every 6 hours with food to treat pain and discomfort.  Advised to refer to the dental resources list and contact the dentist to see if he can be seen within the next week to have the area evaluated and treated.  Advised follow-up PCP or return to urgent care as needed.    ED Prescriptions     Medication Sig Dispense Auth. Provider   ondansetron (ZOFRAN) 4 MG tablet Take 1 tablet (4 mg total) by mouth every 8 (eight) hours as needed for nausea or vomiting. 20 tablet Nyoka Lint, PA-C   clindamycin (CLEOCIN) 300 MG capsule Take 1 capsule (300 mg total) by mouth 3 (three) times daily. 21 capsule Nyoka Lint, PA-C   ibuprofen (ADVIL) 600 MG tablet Take 1 tablet (600 mg total) by mouth every 6 (six) hours as needed. 30 tablet Nyoka Lint, PA-C      PDMP not  reviewed this encounter.   Nyoka Lint, PA-C 02/23/23 1235

## 2023-02-23 NOTE — Discharge Instructions (Signed)
Advised take Zofran 4 mg every 6 hours as needed for nausea and stomach cramping. Advised to take the clindamycin 300 mg, 1 3 times a day until completed to treat the infection. Advised take ibuprofen 600 mg every 6 hours with food to treat pain and discomfort.  Advised to refer to the dental resources list and contact the dentist to see if he can be seen within the next week to have the area evaluated and treated.  Advised follow-up PCP or return to urgent care as needed.
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
## 2023-03-10 ENCOUNTER — Other Ambulatory Visit: Payer: Self-pay | Admitting: Gastroenterology

## 2023-03-10 DIAGNOSIS — B9681 Helicobacter pylori [H. pylori] as the cause of diseases classified elsewhere: Secondary | ICD-10-CM

## 2023-03-17 ENCOUNTER — Telehealth: Payer: Self-pay

## 2023-03-17 NOTE — Telephone Encounter (Signed)
Patient needs an appointment , patient has breathing problems specially at night and a lot of wheezing. During the day is not that bad.

## 2023-03-18 ENCOUNTER — Ambulatory Visit (HOSPITAL_COMMUNITY): Payer: Medicaid Other | Admitting: Clinical

## 2023-03-18 NOTE — Telephone Encounter (Signed)
Patient has been schedule for 03/19/23

## 2023-03-19 ENCOUNTER — Ambulatory Visit: Payer: Medicaid Other | Admitting: Internal Medicine

## 2023-03-19 ENCOUNTER — Encounter: Payer: Self-pay | Admitting: Internal Medicine

## 2023-03-19 VITALS — BP 150/84 | HR 80 | Resp 16 | Ht 62.25 in | Wt 215.0 lb

## 2023-03-19 DIAGNOSIS — R0600 Dyspnea, unspecified: Secondary | ICD-10-CM | POA: Diagnosis not present

## 2023-03-19 DIAGNOSIS — Z789 Other specified health status: Secondary | ICD-10-CM

## 2023-03-19 DIAGNOSIS — F1090 Alcohol use, unspecified, uncomplicated: Secondary | ICD-10-CM

## 2023-03-19 DIAGNOSIS — E119 Type 2 diabetes mellitus without complications: Secondary | ICD-10-CM

## 2023-03-19 DIAGNOSIS — R0683 Snoring: Secondary | ICD-10-CM

## 2023-03-19 DIAGNOSIS — Z59819 Housing instability, housed unspecified: Secondary | ICD-10-CM | POA: Diagnosis not present

## 2023-03-19 DIAGNOSIS — Z720 Tobacco use: Secondary | ICD-10-CM

## 2023-03-19 DIAGNOSIS — I10 Essential (primary) hypertension: Secondary | ICD-10-CM

## 2023-03-19 DIAGNOSIS — E669 Obesity, unspecified: Secondary | ICD-10-CM

## 2023-03-19 DIAGNOSIS — E7800 Pure hypercholesterolemia, unspecified: Secondary | ICD-10-CM

## 2023-03-19 MED ORDER — ASPIRIN 81 MG PO TBEC: 81.0000 mg | DELAYED_RELEASE_TABLET | Freq: Every day | ORAL | 12 refills | Status: DC

## 2023-03-19 MED ORDER — CARVEDILOL 3.125 MG PO TABS: 3.1250 mg | ORAL_TABLET | Freq: Two times a day (BID) | ORAL | 11 refills | Status: DC

## 2023-03-19 MED ORDER — NITROGLYCERIN 0.4 MG SL SUBL: 0.4000 mg | SUBLINGUAL_TABLET | SUBLINGUAL | 1 refills | Status: DC | PRN

## 2023-03-19 MED ORDER — NICOTINE POLACRILEX 4 MG MT GUM: 4.0000 mg | CHEWING_GUM | OROMUCOSAL | 0 refills | Status: DC | PRN

## 2023-03-19 MED ORDER — METFORMIN HCL ER 500 MG PO TB24: ORAL_TABLET | ORAL | 11 refills | Status: DC

## 2023-03-19 MED ORDER — CETIRIZINE HCL 10 MG PO TABS: 10.0000 mg | ORAL_TABLET | Freq: Every day | ORAL | 11 refills | Status: DC

## 2023-03-19 MED ORDER — ATORVASTATIN CALCIUM 80 MG PO TABS: 80.0000 mg | ORAL_TABLET | Freq: Every day | ORAL | 11 refills | Status: DC

## 2023-03-19 NOTE — Progress Notes (Signed)
Subjective:    Patient ID: Cheyenne Fowler, female   DOB: [birth year], 57 y.o.   MRN: [id number]   HPI Has not been seen for over 1 year.  Was having difficulties with housing.   Dyspnea:  has noted for past 2 weeks.  Getting up to go to the bathroom and coming back to sit down and she is short of breath.  Gets a tightening and possibly numbness in her bilateral arms when occurs.  No definite chest pain.  No LE edema.  Sleeps on one pillow.  Does describe PND for 2 episodes in past week.  Feels she is wheezing when dyspneic.  Sometimes light headed when stands from lying in bed.   She has not been seen for chronic health issues in past year.  When last seen beginning of 2023, she had relatively new onset of DM and did not follow up thereafter for treatment of elevated LDL.   She has alcohol use disorder, but has decreased intake as she is living with her daughter.  Still drinking 24 to 40 oz daily. Continues to smoke--states 2 to 10 cigs daily. Her husband smokes as well.  They do most of this in his truck.  2.  Environmental and seasonal allergies:  Noted more symptoms starting about 1 month ago.  She has not had this dyspnea with her allergies in the past.  Nasal stuffiness, sneezing.  No eye or throat symptoms.  She has been taking Zyrtec and does help.  Not taking regularly.    3.  DM:  Does not check sugars.    Last A1C after started on Metformin was 6.1%  4.  Elevated LDL:  improved after starting Atorvastatin for DM, though LDL still too high in May 2023, but lost to follow up.  Not on statin currently--not clear when stopped taking.    5.  Snoring:  husband in later.  He is not sure if she has apneic episodes, but "oh my goodness, she snores."  Current Meds  Medication Sig   cetirizine (ZYRTEC) 10 MG tablet Take 10 mg by mouth daily.   clindamycin (CLEOCIN) 300 MG capsule Take 1 capsule (300 mg total) by mouth 3 (three) times daily.   hydrOXYzine (VISTARIL) 50 MG capsule Take 1  capsule (50 mg total) by mouth 3 (three) times daily as needed.   ibuprofen (ADVIL) 600 MG tablet Take 1 tablet (600 mg total) by mouth every 6 (six) hours as needed. (Patient taking differently: Take 800 mg by mouth every 6 (six) hours as needed.)   lamoTRIgine (LAMICTAL) 150 MG tablet Take 1 tablet (150 mg total) by mouth daily.   losartan (COZAAR) 50 MG tablet Take 1 tablet (50 mg total) by mouth daily.   metFORMIN (GLUCOPHAGE-XR) 500 MG 24 hr tablet TAKE 1 TABLET BY MOUTH TWICE DAILY WITH MEALS   pantoprazole (PROTONIX) 40 MG tablet Take 1 tablet by mouth twice daily   QUEtiapine (SEROQUEL) 100 MG tablet Take 1 tablet (100 mg total) by mouth at bedtime.   Allergies  Allergen Reactions   Penicillins     REACTION: hives   Sulfonamide Derivatives     REACTION: hives     Review of Systems    Objective:   BP (!) 150/84 (BP Location: Right Arm, Patient Position: Sitting, Cuff Size: Normal)   Pulse 80   Resp 16   Ht 5' 2.25" (1.581 m)   Wt 215 lb (97.5 kg)   LMP 06/09/2018 (Exact Date) Comment:  Bleeding one month ago.  BMI 39.01 kg/m   Physical Exam Obese.  Smells strongly of cigarette smoke HEENT:  PERRL, EOMI TMs pearly gray, nasal mucosa boggy/swollen with clear to white discharge.  Unable to see beyond soft tissue of tonsillar areas. Neck:  Supple, No adenopathy Chest:  CTA, resonant to percussion, but does get short of breath lying flat. CV:  RRR with normal S1 and S2, No S3, S4 or murmur.  No carotid bruits.  No JVD noted.  Carotid, radial and DP pulses normal and equal Abd:  S, NT, No HSM or mass.  + BS LE:  No edema.  ECG:  no ischemic changes.  NSR.  Assessment & Plan   Dyspnea with bilateral discomfort into shoulders/arms and numbness:  ASA 81 mg daily.  Carvedilol 3.125 mg twice daily.  Atorvastatin 80 mg daily.  Continue Losartan.  NTG if needed for dyspneic episodes.  Stop smoking--nicotine gum.  Spoke with her husband, Mr. Bobby Rumpf to stop with her and no smoking  around her. Cardiology referral to rule out cardiac etiology  2.  Recent GI evaluation:  stop ibuprofen when taking ASA.  Take ASA with food.  3.  DM:  A1C.  Continue Metformin.  Consider Jardiance if A1C not at goal  4.  Hypertension:  adding Carvedilol.  5.  Elevated LDL:  restart statin at high dose.  6.  Housing insecurity/ETOH and tobacco use disorders:  referral to [facility] for support with this.

## 2023-03-20 LAB — LIPID PANEL W/O CHOL/HDL RATIO

## 2023-03-21 LAB — COMPREHENSIVE METABOLIC PANEL
ALT: 21 IU/L (ref 0–32)
AST: 13 IU/L (ref 0–40)
Albumin/Globulin Ratio: 1.5 (ref 1.2–2.2)
Albumin: 4.1 g/dL (ref 3.8–4.9)
Alkaline Phosphatase: 103 IU/L (ref 44–121)
BUN/Creatinine Ratio: 18 (ref 9–23)
BUN: 15 mg/dL (ref 6–24)
Bilirubin Total: 0.2 mg/dL (ref 0.0–1.2)
CO2: 24 mmol/L (ref 20–29)
Calcium: 9.3 mg/dL (ref 8.7–10.2)
Chloride: 107 mmol/L — ABNORMAL HIGH (ref 96–106)
Creatinine, Ser: 0.84 mg/dL (ref 0.57–1.00)
Globulin, Total: 2.7 g/dL (ref 1.5–4.5)
Glucose: 73 mg/dL (ref 70–99)
Potassium: 4.6 mmol/L (ref 3.5–5.2)
Sodium: 143 mmol/L (ref 134–144)
Total Protein: 6.8 g/dL (ref 6.0–8.5)
eGFR: 81 mL/min/{1.73_m2} (ref >59)

## 2023-03-21 LAB — CBC WITH DIFFERENTIAL/PLATELET
Basophils Absolute: 0 10*3/uL (ref 0.0–0.2)
Basos: 0 %
EOS (ABSOLUTE): 0.2 10*3/uL (ref 0.0–0.4)
Eos: 2 %
Hematocrit: 41 % (ref 34.0–46.6)
Hemoglobin: 13.6 g/dL (ref 11.1–15.9)
Immature Grans (Abs): 0.1 10*3/uL (ref 0.0–0.1)
Immature Granulocytes: 1 %
Lymphocytes Absolute: 4.6 10*3/uL — ABNORMAL HIGH (ref 0.7–3.1)
Lymphs: 48 %
MCH: 28.9 pg (ref 26.6–33.0)
MCHC: 33.2 g/dL (ref 31.5–35.7)
MCV: 87 fL (ref 79–97)
Monocytes Absolute: 1.2 10*3/uL — ABNORMAL HIGH (ref 0.1–0.9)
Monocytes: 12 %
Neutrophils Absolute: 3.6 10*3/uL (ref 1.4–7.0)
Neutrophils: 37 %
Platelets: 287 10*3/uL (ref 150–450)
RBC: 4.7 x10E6/uL (ref 3.77–5.28)
RDW: 12.5 % (ref 11.7–15.4)
WBC: 9.7 10*3/uL (ref 3.4–10.8)

## 2023-03-21 LAB — MICROALBUMIN / CREATININE URINE RATIO
Creatinine, Urine: 110 mg/dL
Microalb/Creat Ratio: 15 mg/g creat (ref 0–29)
Microalbumin, Urine: 16.6 ug/mL

## 2023-03-21 LAB — LIPID PANEL W/O CHOL/HDL RATIO
Cholesterol, Total: 202 mg/dL — ABNORMAL HIGH (ref 100–199)
HDL: 60 mg/dL (ref >39)
LDL Chol Calc (NIH): 110 mg/dL — ABNORMAL HIGH (ref 0–99)
Triglycerides: 186 mg/dL — ABNORMAL HIGH (ref 0–149)
VLDL Cholesterol Cal: 32 mg/dL (ref 5–40)

## 2023-03-21 LAB — HGB A1C W/O EAG: Hgb A1c MFr Bld: 6 % — ABNORMAL HIGH (ref 4.8–5.6)

## 2023-03-27 ENCOUNTER — Other Ambulatory Visit: Payer: Medicaid Other

## 2023-04-01 ENCOUNTER — Other Ambulatory Visit: Payer: Medicaid Other

## 2023-04-01 ENCOUNTER — Ambulatory Visit (INDEPENDENT_AMBULATORY_CARE_PROVIDER_SITE_OTHER): Payer: Medicaid Other | Admitting: Clinical

## 2023-04-01 DIAGNOSIS — F3130 Bipolar disorder, current episode depressed, mild or moderate severity, unspecified: Secondary | ICD-10-CM | POA: Diagnosis not present
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
## 2023-04-09 ENCOUNTER — Encounter (HOSPITAL_COMMUNITY): Payer: Self-pay

## 2023-04-09 ENCOUNTER — Telehealth (HOSPITAL_COMMUNITY): Payer: Medicaid Other | Admitting: Psychiatry

## 2023-04-11 ENCOUNTER — Encounter (HOSPITAL_BASED_OUTPATIENT_CLINIC_OR_DEPARTMENT_OTHER): Payer: Medicaid Other | Admitting: Internal Medicine

## 2023-04-14 ENCOUNTER — Telehealth: Payer: Self-pay

## 2023-04-14 DIAGNOSIS — I10 Essential (primary) hypertension: Secondary | ICD-10-CM

## 2023-04-14 NOTE — Telephone Encounter (Signed)
Patient called to request an order for bp monitor and glucometer with test strips to be sent to [facility].

## 2023-04-21 ENCOUNTER — Ambulatory Visit (INDEPENDENT_AMBULATORY_CARE_PROVIDER_SITE_OTHER): Payer: Medicaid Other | Admitting: Clinical

## 2023-04-21 DIAGNOSIS — F3130 Bipolar disorder, current episode depressed, mild or moderate severity, unspecified: Secondary | ICD-10-CM | POA: Diagnosis not present
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
## 2023-04-30 ENCOUNTER — Ambulatory Visit: Payer: Medicaid Other | Attending: Cardiovascular Disease | Admitting: Cardiovascular Disease

## 2023-04-30 ENCOUNTER — Encounter: Payer: Self-pay | Admitting: Cardiovascular Disease

## 2023-04-30 VITALS — BP 138/68 | HR 73 | Ht 62.0 in | Wt 205.0 lb

## 2023-04-30 DIAGNOSIS — Z01810 Encounter for preprocedural cardiovascular examination: Secondary | ICD-10-CM | POA: Diagnosis not present

## 2023-04-30 DIAGNOSIS — I200 Unstable angina: Secondary | ICD-10-CM | POA: Insufficient documentation

## 2023-04-30 DIAGNOSIS — E782 Mixed hyperlipidemia: Secondary | ICD-10-CM | POA: Diagnosis not present

## 2023-04-30 MED ORDER — ISOSORBIDE MONONITRATE ER 30 MG PO TB24: 30.0000 mg | ORAL_TABLET | Freq: Every day | ORAL | 3 refills | Status: DC

## 2023-04-30 MED ORDER — ASPIRIN 81 MG PO TBEC: 81.0000 mg | DELAYED_RELEASE_TABLET | Freq: Every day | ORAL | 12 refills | Status: DC

## 2023-04-30 NOTE — Progress Notes (Signed)
Cardiology Office Note:    Date:  04/30/2023   ID:  Cheyenne Fowler, DOB 10-15-1966, MRN [id number]  PCP:  Julieanne Manson, MD   [facility] Providers Cardiologist: Deyja Sochacki  Click to update primary MD,subspecialty MD or APP then REFRESH:1}    Referring MD: Julieanne Manson, MD   Chief Complaint  Patient presents with   unstable angina     History of Present Illness:    Cheyenne Fowler is a 57 y.o. female with a hx of htn, murmur   She recently saw Dr. Delrae Alfred for some DOE  Is still having DOE  Associated with intrascapular pain Dr. Delrae Alfred gave her NTG with seems to help   Also has exercise induced chest pain .  Associated with this intrascapular pan   Still smoking ,  1/2 ppd Trying to cut back / quit now , now smokes 2 cigarettes a day   Wt is 205.  Has lost 10 lbs over the past several weeks    Past Medical History:  Diagnosis Date   Anemia    Bipolar disorder (HCC)    Diabetes mellitus (HCC) 2022   type 2   Environmental and seasonal allergies 1968   GERD (gastroesophageal reflux disease)    Heart murmur    Hypertension    Upper GI bleed 01/15/2022    Past Surgical History:  Procedure Laterality Date   BIOPSY  01/17/2022   Procedure: BIOPSY;  Surgeon: Tressia Danas, MD;  Location: [facility] ENDOSCOPY;  Service: Gastroenterology;;   CESAREAN SECTION  1982, 1983, 1990   ESOPHAGOGASTRODUODENOSCOPY (EGD) WITH PROPOFOL N/A 01/17/2022   Procedure: ESOPHAGOGASTRODUODENOSCOPY (EGD) WITH PROPOFOL;  Surgeon: Tressia Danas, MD;  Location: [facility] ENDOSCOPY;  Service: Gastroenterology;  Laterality: N/A;   HERNIA REPAIR     Umbilical hernia in the 2nd grade   TUBAL LIGATION  1990    Current Medications: Current Meds  Medication Sig   aspirin EC 81 MG tablet Take 1 tablet (81 mg total) by mouth daily. Swallow whole.   atorvastatin (LIPITOR) 80 MG tablet Take 1 tablet (80 mg total) by mouth daily.   carvedilol (COREG) 3.125 MG tablet Take 1 tablet  (3.125 mg total) by mouth 2 (two) times daily with a meal.   cetirizine (ZYRTEC) 10 MG tablet Take 1 tablet (10 mg total) by mouth daily.   hydrOXYzine (VISTARIL) 50 MG capsule Take 1 capsule (50 mg total) by mouth 3 (three) times daily as needed.   ibuprofen (ADVIL) 800 MG tablet Take 800 mg by mouth as needed for moderate pain.   isosorbide mononitrate (IMDUR) 30 MG 24 hr tablet Take 1 tablet (30 mg total) by mouth daily.   lamoTRIgine (LAMICTAL) 150 MG tablet Take 1 tablet (150 mg total) by mouth daily.   losartan (COZAAR) 50 MG tablet Take 1 tablet (50 mg total) by mouth daily.   metFORMIN (GLUCOPHAGE-XR) 500 MG 24 hr tablet TAKE 1 TABLET BY MOUTH TWICE DAILY WITH MEALS   nitroGLYCERIN (NITROSTAT) 0.4 MG SL tablet Place 1 tablet (0.4 mg total) under the tongue every 5 (five) minutes as needed for chest pain.   pantoprazole (PROTONIX) 40 MG tablet Take 1 tablet by mouth twice daily   QUEtiapine (SEROQUEL) 100 MG tablet Take 1 tablet (100 mg total) by mouth at bedtime.     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  Was drinking every day prior.  Was drinking due to stress.She and husband drink every evening.  Her counselor, Page, at [employer] is aware of her drinking.   Drug use: Not Currently    Types: Marijuana, "Crack" cocaine    Comment:  Last cocaine use was 30 days ago.  No MJ for 2 months.   Sexual activity: Yes    Birth control/protection: Surgical  Other Topics Concern   Not on file  Social History Narrative   She and her husband live with her son and her granddaughter.    Born and raised in [place]    Social Determinants of Health   Financial Resource Strain: Low Risk  (01/23/2022)   Overall Financial Resource Strain (CARDIA)    Difficulty of Paying Living Expenses: Not very hard  Food Insecurity: No Food Insecurity (01/23/2022)   Hunger Vital Sign    Worried About Running Out of Food in the Last Year: Never true    Ran Out of Food in the Last Year: Never true  Transportation Needs: No Transportation Needs (01/23/2022)   PRAPARE - Administrator, Civil Service (Medical): No    Lack of Transportation (Non-Medical): No  Physical Activity: Not on file  Stress: Not on file  Social Connections: Not on file     Family History: The patient's family history includes Bipolar disorder in her brother and sister; Breast cancer in her maternal aunt; COPD in her mother; Cancer in her brother, maternal aunt, maternal aunt, maternal grandfather, and mother; Diabetes in her mother and sister; Schizophrenia in her brother. There is no history of Colon cancer, Colon polyps, Esophageal cancer, Stomach cancer, or Rectal cancer.  ROS:   Please see the history of present illness.     All other systems reviewed and are negative.  EKGs/Labs/Other Studies Reviewed:    The following studies were reviewed today:   EKG: March 18, 2022: Normal sinus rhythm at 60.  No ST or T wave changes.  ECG Apr 30, 2023: Normal sinus rhythm at 66.  No ST or T wave changes. Recent Labs: 03/19/2023: ALT 21; BUN 15; Creatinine, Ser 0.84; Hemoglobin 13.6; Platelets 287; Potassium 4.6; Sodium 143  Recent Lipid Panel    Component Value Date/Time   CHOL 202 (H) 03/19/2023 1617   TRIG 186 (H) 03/19/2023 1617   TRIG 125 04/04/2016 1048   HDL  60 03/19/2023 1617   CHOLHDL 4.3 07/03/2018 1208   VLDL 31 07/03/2018 1208   LDLCALC 110 (H) 03/19/2023 1617     Risk Assessment/Calculations:                Physical Exam:    VS:  BP 138/68   Pulse 73   Ht 5\' 2"  (1.575 m)   Wt 205 lb (93 kg)   LMP 06/09/2018 (Exact Date) Comment: Bleeding one month ago.  SpO2  96%   BMI 37.49 kg/m     Wt Readings from Last 3 Encounters:  04/30/23 205 lb (93 kg)  03/19/23 215 lb (97.5 kg)  08/28/22 209 lb 7 oz (95 kg)     GEN:  Well nourished, well developed in no acute distress HEENT: Normal NECK: No JVD; No carotid bruits LYMPHATICS: No lymphadenopathy CARDIAC: RRR, no murmurs, rubs, gallops RESPIRATORY:  Clear to auscultation without rales, wheezing or rhonchi  ABDOMEN: Soft, non-tender, non-distended MUSCULOSKELETAL:  No edema; No deformity  SKIN: Warm and dry NEUROLOGIC:  Alert and oriented x 3 PSYCHIATRIC:  Normal affect   ASSESSMENT:    1. Pre-procedural cardiovascular examination   2. Mixed hyperlipidemia   3. Unstable angina (HCC)    PLAN:    In order of problems listed above:  Unstable angina: Brekyn presents for further evaluation of symptoms that are consistent with unstable angina.  She has severe shortness of breath walking is level is 20 to 30 feet.  This is associated with intrascapular pain and also some occasional chest pain.  She has a history of hyperlipidemia and a long history of cigarette smoking. She has occasional episodes of this chest discomfort and severe shortness of breath even at rest.  Because of these symptoms at rest I think that she has unstable angina and I think we should proceed with heart catheterization instead of starting with a coronary CT angiogram.  We have discussed the risks, benefits, options of heart catheterization.  She understands and agrees to proceed.      Shared Decision Making/Informed Consent{  The risks [stroke (1 in 1000), death (1 in 1000), kidney failure [usually  temporary] (1 in 500), bleeding (1 in 200), allergic reaction [possibly serious] (1 in 200)], benefits (diagnostic support and management of coronary artery disease) and alternatives of a cardiac catheterization were discussed in detail with Ms. Hefel and she is willing to proceed.    Medication Adjustments/Labs and Tests Ordered: Current medicines are reviewed at length with the patient today.  Concerns regarding medicines are outlined above.  Orders Placed This Encounter  Procedures   CBC   Basic metabolic panel   EKG 12-Lead   Meds ordered this encounter  Medications   isosorbide mononitrate (IMDUR) 30 MG 24 hr tablet    Sig: Take 1 tablet (30 mg total) by mouth daily.    Dispense:  90 tablet    Refill:  3   aspirin EC 81 MG tablet    Sig: Take 1 tablet (81 mg total) by mouth daily. Swallow whole.    Dispense:  90 tablet    Refill:  12    Patient Instructions  Medication Instructions:  START Isosorbide (imdur) 30mg  daily START Aspirin 81mg  daily *If you need a refill on your cardiac medications before your next appointment, please call your pharmacy*  Lab Work: CBC, BMET today If you have labs (blood work) drawn today and your tests are completely normal, you will receive your results only by: MyChart Message (if you have MyChart) OR A paper copy in the mail If you have any lab test that is abnormal or we need to change your treatment, we will call you to review the results.  Testing/Procedures: L heart catheterization Your physician has requested that you have a cardiac catheterization. Cardiac catheterization is used to diagnose and/or treat various heart conditions. Doctors may recommend this procedure for a number of different reasons. The most common reason is to evaluate chest pain. Chest pain can be  a symptom of coronary artery disease (CAD), and cardiac catheterization can show whether plaque is narrowing or blocking your heart's arteries. This procedure is also used  to evaluate the valves, as well as measure the blood flow and oxygen levels in different parts of your heart. For further information please visit [email or web address] Please follow instruction sheet, as given.  Follow-Up: At [facility], you and your health needs are our priority.  As part of our continuing mission to provide you with exceptional heart care, we have created designated Provider Care Teams.  These Care Teams include your primary Cardiologist (physician) and Advanced Practice Providers (APPs -  Physician Assistants and Nurse Practitioners) who all work together to provide you with the care you need, when you need it.  Your next appointment:   3 month(s)  Provider:   Kristeen Miss, MD   Other Instructions       Cardiac/Peripheral Catheterization   You are scheduled for a Cardiac Catheterization on Tuesday, May 14 with Dr. Tonny Bollman.  1. Please arrive at the [facility] (Main Entrance A) at [facility]: [address] [place], [place] [address] at 10:00 AM (This time is two hour(s) before your procedure to ensure your preparation). Free valet parking service is available. You will check in at ADMITTING. The support person will be asked to wait in the waiting room.  It is OK to have someone drop you off and come back when you are ready to be discharged.        Special note: Every effort is made to have your procedure done on time. Please understand that emergencies sometimes delay scheduled procedures.  2. Diet: Do not eat solid foods after midnight.  You may have clear liquids until 5 AM the day of the procedure.  3. Labs: TODAY  4. Medication instructions in preparation for your procedure:   Contrast Allergy: No  DO NOT TAKE Metformin the day of procedure and hold for 2 days afterwards (resume 5/17)  On the morning of your procedure, take Aspirin 81 mg and any morning medicines NOT listed above.  You may use sips of water.  5. Plan to go home  the same day, you will only stay overnight if medically necessary. 6. You MUST have a responsible adult to drive you home. 7. An adult MUST be with you the first 24 hours after you arrive home. 8. Bring a current list of your medications, and the last time and date medication taken. 9. Bring ID and current insurance cards. 10.Please wear clothes that are easy to get on and off and wear slip-on shoes.  Thank you for allowing [place] to care for you!   -- [facility] services    Signed, Kristeen Miss, MD  04/30/2023 5:47 PM    [facility]

## 2023-04-30 NOTE — H&P (View-Only) (Signed)
Cardiology Office Note:    Date:  04/30/2023   ID:  Cheyenne Fowler, DOB 04/14/1966, MRN [id number]  PCP:  Fowler, Elizabeth, MD    [facility] Providers Cardiologist: Cheyenne Fowler  Click to update primary MD,subspecialty MD or APP then REFRESH:1}    Referring MD: Fowler, Elizabeth, MD   Chief Complaint  Patient presents with   unstable angina     History of Present Illness:    Cheyenne Fowler is a 56 y.o. female with a hx of htn, murmur   She recently saw Dr. Mulberry for some DOE  Is still having DOE  Associated with intrascapular pain Dr. Mulberry gave her NTG with seems to help   Also has exercise induced chest pain .  Associated with this intrascapular pan   Still smoking ,  1/2 ppd Trying to cut back / quit now , now smokes 2 cigarettes a day   Wt is 205.  Has lost 10 lbs over the past several weeks    Past Medical History:  Diagnosis Date   Anemia    Bipolar disorder (HCC)    Diabetes mellitus (HCC) 2022   type 2   Environmental and seasonal allergies 1968   GERD (gastroesophageal reflux disease)    Heart murmur    Hypertension    Upper GI bleed 01/15/2022    Past Surgical History:  Procedure Laterality Date   BIOPSY  01/17/2022   Procedure: BIOPSY;  Surgeon: Fowler, Kimberly, MD;  Location: [facility];  Service: Gastroenterology;;   CESAREAN SECTION  1982, 1983, 1990   ESOPHAGOGASTRODUODENOSCOPY (EGD) WITH PROPOFOL N/A 01/17/2022   Procedure: ESOPHAGOGASTRODUODENOSCOPY (EGD) WITH PROPOFOL;  Surgeon: Fowler, Kimberly, MD;  Location: [facility];  Service: Gastroenterology;  Laterality: N/A;   HERNIA REPAIR     Umbilical hernia in the 2nd grade   TUBAL LIGATION  1990    Current Medications: Current Meds  Medication Sig   aspirin EC 81 MG tablet Take 1 tablet (81 mg total) by mouth daily. Swallow whole.   atorvastatin (LIPITOR) 80 MG tablet Take 1 tablet (80 mg total) by mouth daily.   carvedilol (COREG) 3.125 MG tablet Take 1 tablet  (3.125 mg total) by mouth 2 (two) times daily with a meal.   cetirizine (ZYRTEC) 10 MG tablet Take 1 tablet (10 mg total) by mouth daily.   hydrOXYzine (VISTARIL) 50 MG capsule Take 1 capsule (50 mg total) by mouth 3 (three) times daily as needed.   ibuprofen (ADVIL) 800 MG tablet Take 800 mg by mouth as needed for moderate pain.   isosorbide mononitrate (IMDUR) 30 MG 24 hr tablet Take 1 tablet (30 mg total) by mouth daily.   lamoTRIgine (LAMICTAL) 150 MG tablet Take 1 tablet (150 mg total) by mouth daily.   losartan (COZAAR) 50 MG tablet Take 1 tablet (50 mg total) by mouth daily.   metFORMIN (GLUCOPHAGE-XR) 500 MG 24 hr tablet TAKE 1 TABLET BY MOUTH TWICE DAILY WITH MEALS   nitroGLYCERIN (NITROSTAT) 0.4 MG SL tablet Place 1 tablet (0.4 mg total) under the tongue every 5 (five) minutes as needed for chest pain.   pantoprazole (PROTONIX) 40 MG tablet Take 1 tablet by mouth twice daily   QUEtiapine (SEROQUEL) 100 MG tablet Take 1 tablet (100 mg total) by mouth at bedtime.     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  Was drinking every day prior.  Was drinking due to stress.She and husband drink every evening.  Her counselor, Cheyenne Fowler, at [place] is aware of her drinking.   Drug use: Not Currently    Types: Marijuana, "Crack" cocaine    Comment:  Last cocaine use was 30 days ago.  No MJ for 2 months.   Sexual activity: Yes    Birth control/protection: Surgical  Other Topics Concern   Not on file  Social History Narrative   She and her husband live with her son and her granddaughter.    Born and raised in [place]    Social Determinants of Health   Financial Resource Strain: Low Risk  (01/23/2022)   Overall Financial Resource Strain (CARDIA)    Difficulty of Paying Living Expenses: Not very hard  Food Insecurity: No Food Insecurity (01/23/2022)   Hunger Vital Sign    Worried About Running Out of Food in the Last Year: Never true    Ran Out of Food in the Last Year: Never true  Transportation Needs: No Transportation Needs (01/23/2022)   PRAPARE - Transportation    Lack of Transportation (Medical): No    Lack of Transportation (Non-Medical): No  Physical Activity: Not on file  Stress: Not on file  Social Connections: Not on file     Family History: The patient's family history includes Bipolar disorder in her brother and sister; Breast cancer in her maternal aunt; COPD in her mother; Cancer in her brother, maternal aunt, maternal aunt, maternal grandfather, and mother; Diabetes in her mother and sister; Schizophrenia in her brother. There is no history of Colon cancer, Colon polyps, Esophageal cancer, Stomach cancer, or Rectal cancer.  ROS:   Please see the history of present illness.     All other systems reviewed and are negative.  EKGs/Labs/Other Studies Reviewed:    The following studies were reviewed today:   EKG: March 18, 2022: Normal sinus rhythm at 60.  No ST or T wave changes.  ECG Apr 30, 2023: Normal sinus rhythm at 66.  No ST or T wave changes. Recent Labs: 03/19/2023: ALT 21; BUN 15; Creatinine, Ser 0.84; Hemoglobin 13.6; Platelets 287; Potassium 4.6; Sodium 143  Recent Lipid Panel    Component Value Date/Time   CHOL 202 (H) 03/19/2023 1617   TRIG 186 (H) 03/19/2023 1617   TRIG 125 04/04/2016 1048   HDL  60 03/19/2023 1617   CHOLHDL 4.3 07/03/2018 1208   VLDL 31 07/03/2018 1208   LDLCALC 110 (H) 03/19/2023 1617     Risk Assessment/Calculations:                Physical Exam:    VS:  BP 138/68   Pulse 73   Ht 5' 2" (1.575 m)   Wt 205 lb (93 kg)   LMP 06/09/2018 (Exact Date) Comment: Bleeding one month ago.  SpO2   96%   BMI 37.49 kg/m     Wt Readings from Last 3 Encounters:  04/30/23 205 lb (93 kg)  03/19/23 215 lb (97.5 kg)  08/28/22 209 lb 7 oz (95 kg)     GEN:  Well nourished, well developed in no acute distress HEENT: Normal NECK: No JVD; No carotid bruits LYMPHATICS: No lymphadenopathy CARDIAC: RRR, no murmurs, rubs, gallops RESPIRATORY:  Clear to auscultation without rales, wheezing or rhonchi  ABDOMEN: Soft, non-tender, non-distended MUSCULOSKELETAL:  No edema; No deformity  SKIN: Warm and dry NEUROLOGIC:  Alert and oriented x 3 PSYCHIATRIC:  Normal affect   ASSESSMENT:    1. Pre-procedural cardiovascular examination   2. Mixed hyperlipidemia   3. Unstable angina (HCC)    PLAN:    In order of problems listed above:  Unstable angina: Dnyla presents for further evaluation of symptoms that are consistent with unstable angina.  She has severe shortness of breath walking is level is 20 to 30 feet.  This is associated with intrascapular pain and also some occasional chest pain.  She has a history of hyperlipidemia and a long history of cigarette smoking. She has occasional episodes of this chest discomfort and severe shortness of breath even at rest.  Because of these symptoms at rest I think that she has unstable angina and I think we should proceed with heart catheterization instead of starting with a coronary CT angiogram.  We have discussed the risks, benefits, options of heart catheterization.  She understands and agrees to proceed.      Shared Decision Making/Informed Consent{  The risks [stroke (1 in 1000), death (1 in 1000), kidney failure [usually  temporary] (1 in 500), bleeding (1 in 200), allergic reaction [possibly serious] (1 in 200)], benefits (diagnostic support and management of coronary artery disease) and alternatives of a cardiac catheterization were discussed in detail with Ms. Mitcham and she is willing to proceed.    Medication Adjustments/Labs and Tests Ordered: Current medicines are reviewed at length with the patient today.  Concerns regarding medicines are outlined above.  Orders Placed This Encounter  Procedures   CBC   Basic metabolic panel   EKG 12-Lead   Meds ordered this encounter  Medications   isosorbide mononitrate (IMDUR) 30 MG 24 hr tablet    Sig: Take 1 tablet (30 mg total) by mouth daily.    Dispense:  90 tablet    Refill:  3   aspirin EC 81 MG tablet    Sig: Take 1 tablet (81 mg total) by mouth daily. Swallow whole.    Dispense:  90 tablet    Refill:  12    Patient Instructions  Medication Instructions:  START Isosorbide (imdur) 30mg daily START Aspirin 81mg daily *If you need a refill on your cardiac medications before your next appointment, please call your pharmacy*  Lab Work: CBC, BMET today If you have labs (blood work) drawn today and your tests are completely normal, you will receive your results only by: MyChart Message (if you have MyChart) OR A paper copy in the mail If you have any lab test that is abnormal or we need to change your treatment, we will call you to review the results.  Testing/Procedures: L heart catheterization Your physician has requested that you have a cardiac catheterization. Cardiac catheterization is used to diagnose and/or treat various heart conditions. Doctors may recommend this procedure for a number of different reasons. The most common reason is to evaluate chest pain. Chest pain can be   a symptom of coronary artery disease (CAD), and cardiac catheterization can show whether plaque is narrowing or blocking your heart's arteries. This procedure is also used  to evaluate the valves, as well as measure the blood flow and oxygen levels in different parts of your heart. For further information please visit www.cardiosmart.org. Please follow instruction sheet, as given.  Follow-Up: At [facility], you and your health needs are our priority.  As part of our continuing mission to provide you with exceptional heart care, we have created designated Provider Care Teams.  These Care Teams include your primary Cardiologist (physician) and Advanced Practice Providers (APPs -  Physician Assistants and Nurse Practitioners) who all work together to provide you with the care you need, when you need it.  Your next appointment:   3 month(s)  Provider:   Manette Doto, MD   Other Instructions       Cardiac/Peripheral Catheterization   You are scheduled for a Cardiac Catheterization on Tuesday, May 14 with Dr. Michael Cooper.  1. Please arrive at the [facility] (Main Entrance A) at [facility]: [address] [place], [place] [address] at 10:00 AM (This time is two hour(s) before your procedure to ensure your preparation). Free valet parking service is available. You will check in at ADMITTING. The support person will be asked to wait in the waiting room.  It is OK to have someone drop you off and come back when you are ready to be discharged.        Special note: Every effort is made to have your procedure done on time. Please understand that emergencies sometimes delay scheduled procedures.  2. Diet: Do not eat solid foods after midnight.  You may have clear liquids until 5 AM the day of the procedure.  3. Labs: TODAY  4. Medication instructions in preparation for your procedure:   Contrast Allergy: No  DO NOT TAKE Metformin the day of procedure and hold for 2 days afterwards (resume 5/17)  On the morning of your procedure, take Aspirin 81 mg and any morning medicines NOT listed above.  You may use sips of water.  5. Plan to go home  the same day, you will only stay overnight if medically necessary. 6. You MUST have a responsible adult to drive you home. 7. An adult MUST be with you the first 24 hours after you arrive home. 8. Bring a current list of your medications, and the last time and date medication taken. 9. Bring ID and current insurance cards. 10.Please wear clothes that are easy to get on and off and wear slip-on shoes.  Thank you for allowing us to care for you!   -- [facility]    Signed, Rodney Yera, MD  04/30/2023 5:47 PM    [facility]  

## 2023-04-30 NOTE — Patient Instructions (Signed)
Medication Instructions:  START Isosorbide (imdur) 30mg  daily START Aspirin 81mg  daily *If you need a refill on your cardiac medications before your next appointment, please call your pharmacy*  Lab Work: CBC, BMET today If you have labs (blood work) drawn today and your tests are completely normal, you will receive your results only by: MyChart Message (if you have MyChart) OR A paper copy in the mail If you have any lab test that is abnormal or we need to change your treatment, we will call you to review the results.  Testing/Procedures: L heart catheterization Your physician has requested that you have a cardiac catheterization. Cardiac catheterization is used to diagnose and/or treat various heart conditions. Doctors may recommend this procedure for a number of different reasons. The most common reason is to evaluate chest pain. Chest pain can be a symptom of coronary artery disease (CAD), and cardiac catheterization can show whether plaque is narrowing or blocking your heart's arteries. This procedure is also used to evaluate the valves, as well as measure the blood flow and oxygen levels in different parts of your heart. For further information please visit [email or web address] Please follow instruction sheet, as given.  Follow-Up: At [facility], you and your health needs are our priority.  As part of our continuing mission to provide you with exceptional heart care, we have created designated Provider Care Teams.  These Care Teams include your primary Cardiologist (physician) and Advanced Practice Providers (APPs -  Physician Assistants and Nurse Practitioners) who all work together to provide you with the care you need, when you need it.  Your next appointment:   3 month(s)  Provider:   Kristeen Miss, MD   Other Instructions       Cardiac/Peripheral Catheterization   You are scheduled for a Cardiac Catheterization on Tuesday, May 14 with Dr. Tonny Bollman.  1.  Please arrive at the [facility] (Main Entrance A) at [facility]: [address] [place], [place] [address] at 10:00 AM (This time is two hour(s) before your procedure to ensure your preparation). Free valet parking service is available. You will check in at ADMITTING. The support person will be asked to wait in the waiting room.  It is OK to have someone drop you off and come back when you are ready to be discharged.        Special note: Every effort is made to have your procedure done on time. Please understand that emergencies sometimes delay scheduled procedures.  2. Diet: Do not eat solid foods after midnight.  You may have clear liquids until 5 AM the day of the procedure.  3. Labs: TODAY  4. Medication instructions in preparation for your procedure:   Contrast Allergy: No  DO NOT TAKE Metformin the day of procedure and hold for 2 days afterwards (resume 5/17)  On the morning of your procedure, take Aspirin 81 mg and any morning medicines NOT listed above.  You may use sips of water.  5. Plan to go home the same day, you will only stay overnight if medically necessary. 6. You MUST have a responsible adult to drive you home. 7. An adult MUST be with you the first 24 hours after you arrive home. 8. Bring a current list of your medications, and the last time and date medication taken. 9. Bring ID and current insurance cards. 10.Please wear clothes that are easy to get on and off and wear slip-on shoes.  Thank you for allowing [place] to care  for you!   -- [facility]

## 2023-05-01 LAB — BASIC METABOLIC PANEL
BUN/Creatinine Ratio: 16 (ref 9–23)
BUN: 14 mg/dL (ref 6–24)
CO2: 26 mmol/L (ref 20–29)
Calcium: 9.5 mg/dL (ref 8.7–10.2)
Chloride: 107 mmol/L — ABNORMAL HIGH (ref 96–106)
Creatinine, Ser: 0.85 mg/dL (ref 0.57–1.00)
Glucose: 92 mg/dL (ref 70–99)
Potassium: 4.3 mmol/L (ref 3.5–5.2)
Sodium: 145 mmol/L — ABNORMAL HIGH (ref 134–144)
eGFR: 80 mL/min/{1.73_m2} (ref >59)

## 2023-05-01 LAB — CBC
Hematocrit: 43 % (ref 34.0–46.6)
Hemoglobin: 14.1 g/dL (ref 11.1–15.9)
MCH: 28.9 pg (ref 26.6–33.0)
MCHC: 32.8 g/dL (ref 31.5–35.7)
MCV: 88 fL (ref 79–97)
Platelets: 285 10*3/uL (ref 150–450)
RBC: 4.88 x10E6/uL (ref 3.77–5.28)
RDW: 12.7 % (ref 11.7–15.4)
WBC: 8 10*3/uL (ref 3.4–10.8)

## 2023-05-05 ENCOUNTER — Telehealth: Payer: Self-pay | Admitting: *Deleted

## 2023-05-05 NOTE — Telephone Encounter (Signed)
Cardiac Catheterization scheduled at [facility] for: Tuesday May 06, 2023 12 Noon Arrival time [facility] Main Entrance A at: 10 AM  Nothing to eat after midnight prior to procedure, clear liquids until 5 AM day of procedure.  Medication instructions: -Hold:  Metformin-day of procedure and 48 hours post procedure  -Other usual morning medications can be taken with sips of water including aspirin 81 mg.  Confirmed patient has responsible adult to drive home post procedure and be with patient first 24 hours after arriving home.  Plan to go home the same day, you will only stay overnight if medically necessary.  Reviewed procedure instructions with patient.

## 2023-05-05 NOTE — Addendum Note (Signed)
Addended by: Jacqlyn Krauss on: 05/05/2023 12:08 PM   Modules accepted: Orders

## 2023-05-06 ENCOUNTER — Other Ambulatory Visit: Payer: Self-pay

## 2023-05-06 ENCOUNTER — Encounter (HOSPITAL_COMMUNITY)
Admission: RE | Disposition: A | Discharge status: Home / Self Care | Payer: Self-pay | Source: Home / Self Care | Attending: Cardiovascular Disease

## 2023-05-06 ENCOUNTER — Ambulatory Visit (HOSPITAL_COMMUNITY)
Admission: RE | Admit: 2023-05-06 | Discharge: 2023-05-06 | Disposition: A | Discharge status: Home / Self Care | Payer: Medicaid Other | Attending: Cardiovascular Disease | Admitting: Cardiovascular Disease

## 2023-05-06 DIAGNOSIS — F17210 Nicotine dependence, cigarettes, uncomplicated: Secondary | ICD-10-CM | POA: Insufficient documentation

## 2023-05-06 DIAGNOSIS — R079 Chest pain, unspecified: Secondary | ICD-10-CM

## 2023-05-06 DIAGNOSIS — R0600 Dyspnea, unspecified: Secondary | ICD-10-CM | POA: Diagnosis not present

## 2023-05-06 DIAGNOSIS — Z01810 Encounter for preprocedural cardiovascular examination: Secondary | ICD-10-CM

## 2023-05-06 DIAGNOSIS — E782 Mixed hyperlipidemia: Secondary | ICD-10-CM | POA: Insufficient documentation

## 2023-05-06 HISTORY — PX: LEFT HEART CATH AND CORONARY ANGIOGRAPHY: CATH118249

## 2023-05-06 LAB — GLUCOSE, CAPILLARY: Glucose-Capillary: 109 mg/dL — ABNORMAL HIGH (ref 70–99)

## 2023-05-06 SURGERY — LEFT HEART CATH AND CORONARY ANGIOGRAPHY
Anesthesia: LOCAL

## 2023-05-06 MED ORDER — SODIUM CHLORIDE 0.9 % IV SOLN: 250.0000 mL | INTRAVENOUS | Status: DC | PRN

## 2023-05-06 MED ORDER — SODIUM CHLORIDE 0.9% FLUSH: 3.0000 mL | INTRAVENOUS | Status: DC | PRN

## 2023-05-06 MED ORDER — LABETALOL HCL 5 MG/ML IV SOLN: 10.0000 mg | INTRAVENOUS | Status: DC | PRN

## 2023-05-06 MED ORDER — VERAPAMIL HCL 2.5 MG/ML IV SOLN
INTRAVENOUS | Status: AC
  Filled 2023-05-06: qty 2

## 2023-05-06 MED ORDER — LIDOCAINE HCL (PF) 1 % IJ SOLN
INTRAMUSCULAR | Status: DC | PRN
  Administered 2023-05-06: 2 mL via INTRADERMAL

## 2023-05-06 MED ORDER — ASPIRIN 81 MG PO CHEW: 81.0000 mg | CHEWABLE_TABLET | ORAL | Status: DC

## 2023-05-06 MED ORDER — SODIUM CHLORIDE 0.9 % WEIGHT BASED INFUSION
3.0000 mL/kg/h | INTRAVENOUS | Status: AC
  Administered 2023-05-06: 3 mL/kg/h via INTRAVENOUS

## 2023-05-06 MED ORDER — ACETAMINOPHEN 325 MG PO TABS: 650.0000 mg | ORAL_TABLET | ORAL | Status: DC | PRN

## 2023-05-06 MED ORDER — IOHEXOL 350 MG/ML SOLN
INTRAVENOUS | Status: DC | PRN
  Administered 2023-05-06: 45 mL

## 2023-05-06 MED ORDER — SODIUM CHLORIDE 0.9% FLUSH: 3.0000 mL | Freq: Two times a day (BID) | INTRAVENOUS | Status: DC

## 2023-05-06 MED ORDER — SODIUM CHLORIDE 0.9 % WEIGHT BASED INFUSION: 1.0000 mL/kg/h | INTRAVENOUS | Status: DC

## 2023-05-06 MED ORDER — LIDOCAINE HCL (PF) 1 % IJ SOLN
INTRAMUSCULAR | Status: AC
  Filled 2023-05-06: qty 30

## 2023-05-06 MED ORDER — FENTANYL CITRATE (PF) 100 MCG/2ML IJ SOLN
INTRAMUSCULAR | Status: DC | PRN
  Administered 2023-05-06: 25 ug via INTRAVENOUS

## 2023-05-06 MED ORDER — HEPARIN SODIUM (PORCINE) 1000 UNIT/ML IJ SOLN
INTRAMUSCULAR | Status: AC
  Filled 2023-05-06: qty 10

## 2023-05-06 MED ORDER — MIDAZOLAM HCL 2 MG/2ML IJ SOLN
INTRAMUSCULAR | Status: DC | PRN
  Administered 2023-05-06: 2 mg via INTRAVENOUS

## 2023-05-06 MED ORDER — VERAPAMIL HCL 2.5 MG/ML IV SOLN
INTRAVENOUS | Status: DC | PRN
  Administered 2023-05-06: 10 mL via INTRA_ARTERIAL

## 2023-05-06 MED ORDER — HEPARIN SODIUM (PORCINE) 1000 UNIT/ML IJ SOLN
INTRAMUSCULAR | Status: DC | PRN
  Administered 2023-05-06: 5000 [IU] via INTRAVENOUS

## 2023-05-06 MED ORDER — ONDANSETRON HCL 4 MG/2ML IJ SOLN: 4.0000 mg | Freq: Four times a day (QID) | INTRAMUSCULAR | Status: DC | PRN

## 2023-05-06 MED ORDER — HYDRALAZINE HCL 20 MG/ML IJ SOLN: 10.0000 mg | INTRAMUSCULAR | Status: DC | PRN

## 2023-05-06 MED ORDER — SODIUM CHLORIDE 0.9 % IV SOLN: INTRAVENOUS | Status: DC

## 2023-05-06 MED ORDER — HEPARIN (PORCINE) IN NACL 1000-0.9 UT/500ML-% IV SOLN
INTRAVENOUS | Status: DC | PRN
  Administered 2023-05-06 (×2): 500 mL

## 2023-05-06 MED ORDER — MIDAZOLAM HCL 2 MG/2ML IJ SOLN
INTRAMUSCULAR | Status: AC
  Filled 2023-05-06: qty 2

## 2023-05-06 MED ORDER — FENTANYL CITRATE (PF) 100 MCG/2ML IJ SOLN
INTRAMUSCULAR | Status: AC
  Filled 2023-05-06: qty 2

## 2023-05-06 SURGICAL SUPPLY — 9 items
CATH 5FR JL3.5 JR4 ANG PIG MP (CATHETERS) IMPLANT
DEVICE RAD COMP TR BAND LRG (VASCULAR PRODUCTS) IMPLANT
GLIDESHEATH SLEND SS 6F .021 (SHEATH) IMPLANT
GUIDEWIRE INQWIRE 1.5J.035X260 (WIRE) IMPLANT
INQWIRE 1.5J .035X260CM (WIRE) ×1
KIT HEART LEFT (KITS) ×1 IMPLANT
PACK CARDIAC CATHETERIZATION (CUSTOM PROCEDURE TRAY) ×1 IMPLANT
TRANSDUCER W/STOPCOCK (MISCELLANEOUS) ×1 IMPLANT
TUBING CIL FLEX 10 FLL-RA (TUBING) ×1 IMPLANT

## 2023-05-06 NOTE — Progress Notes (Signed)
Noticed that TR band had bled after 3cc pulled at 1535, pt did not notice nor inform RN. TR band had stopped bleeding by 1350 will resume removing air from TR band at 1605

## 2023-05-06 NOTE — Interval H&P Note (Signed)
History and Physical Interval Note:  05/06/2023 1:44 PM  Cheyenne Fowler  has presented today for surgery, with the diagnosis of unstable angina.  The various methods of treatment have been discussed with the patient and family. After consideration of risks, benefits and other options for treatment, the patient has consented to  Procedure(s): LEFT HEART CATH AND CORONARY ANGIOGRAPHY (N/A) as a surgical intervention.  The patient's history has been reviewed, patient examined, no change in status, stable for surgery.  I have reviewed the patient's chart and labs.  Questions were answered to the patient's satisfaction.     Tonny Bollman

## 2023-05-06 NOTE — Discharge Instructions (Signed)

## 2023-05-06 NOTE — Progress Notes (Signed)
TR BAND REMOVAL  LOCATION:    right radial  DEFLATED PER PROTOCOL:    Yes.    TIME BAND OFF / DRESSING APPLIED:    1720 gauze dressing applied   SITE UPON ARRIVAL:    Level 0  SITE AFTER BAND REMOVAL:    Level 0  CIRCULATION SENSATION AND MOVEMENT:    Within Normal Limits   Yes.    COMMENTS:   no new issues noted

## 2023-05-07 ENCOUNTER — Encounter (HOSPITAL_COMMUNITY): Payer: Self-pay | Admitting: Cardiovascular Disease

## 2023-05-09 ENCOUNTER — Telehealth: Payer: Self-pay | Admitting: Cardiovascular Disease

## 2023-05-09 NOTE — Telephone Encounter (Signed)
Spoke with patient, confirms bruising around cath site on wrist, denies any bleeding or swelling. Patient wanted to be ensured that bruising doesn't equal infection. No further needs at this time

## 2023-05-09 NOTE — Telephone Encounter (Signed)
Patient stated that on her wrist where her procedure was done there is a large bruise.  Patient believes the wound may be infected and wants to know next steps.

## 2023-05-13 ENCOUNTER — Ambulatory Visit (INDEPENDENT_AMBULATORY_CARE_PROVIDER_SITE_OTHER): Payer: Medicaid Other | Admitting: Clinical

## 2023-05-13 DIAGNOSIS — F3130 Bipolar disorder, current episode depressed, mild or moderate severity, unspecified: Secondary | ICD-10-CM

## 2023-05-13 NOTE — Progress Notes (Signed)
   THERAPIST PROGRESS NOTE  Session Time: 45 minutes  Participation Level: Active  Behavioral Response: CasualAlertEuthymic  Type of Therapy: Individual Therapy  Treatment Goals addressed: client will complete 80% of assigned homework  ProgressTowards Goals: Progressing  Interventions: CBT and Supportive  Summary:  Cheyenne Fowler is a 57 y.o. female who presents for the scheduled appointment oriented x 5, appropriately dressed, and friendly.  Client denied hallucinations and delusions. Client reported on today she is doing pretty well.  Client reported since she was last seen she has been recovering from the procedure she had done at the hospital for cardiovascular examination.  Client reported the healing process is going fine but she does have some visible swelling and bruising on her right arm.  Client reported otherwise that she has been keeping herself active with going for some walks and has even done some yard work.  Client reported she is still at with her husband.  Client reported however she does communicate with him to being supportive of him receiving mental health as well as pursuing appointments for his physical health.  Client reported he was in the hospital not too long ago related to physical health.  Client reported as of right now she does not see them getting back together unless he shows prove that he is on a path of improving himself.  Client reported he made his family known that she was separating herself from him for the time being.  Client reported she is still staying at her daughter's house and that is going well.  Client reported she enjoys the time she has with her grandkids.  Client reported otherwise that she is trying to make healthy lifestyle changes and reduce the amount of stress that she has so her health can improve as well. Evidence of progress towards goal:  client reported 1 positive of enforcing physical and emotional boundaries.  Suicidal/Homicidal:  Nowithout intent/plan  Therapist Response:  Therapist began the appointment asking the client how she has been doing since last seen. Therapist used CBT to engage using active listening and positive emotional support. Therapist used CBT to ask the client how she is keeping appropriate boundaries and coping emotionally with changes to her marriage and family. Therapist used CBT to positively reinforce the clients positive mindset and boundaries. Therapist used CBT ask the client to identify her progress with frequency of use with coping skills with continued practice in her daily activity.       Plan: Return again in 3 weeks.  Diagnosis: bipolar 1 disorder, most recent episode, depressed  Collaboration of Care: Patient refused AEB none requested by the client.  Patient/Guardian was advised Release of Information must be obtained prior to any record release in order to collaborate their care with an outside provider. Patient/Guardian was advised if they have not already done so to contact the registration department to sign all necessary forms in order for [place] to release information regarding their care.   Consent: Patient/Guardian gives verbal consent for treatment and assignment of benefits for services provided during this visit. Patient/Guardian expressed understanding and agreed to proceed.   Neena Rhymes Estefano Victory, LCSW 05/13/2023

## 2023-05-20 ENCOUNTER — Other Ambulatory Visit (INDEPENDENT_AMBULATORY_CARE_PROVIDER_SITE_OTHER): Payer: Medicaid Other

## 2023-05-20 ENCOUNTER — Other Ambulatory Visit (HOSPITAL_COMMUNITY): Payer: Self-pay | Admitting: Psychiatry

## 2023-05-20 ENCOUNTER — Encounter (HOSPITAL_COMMUNITY): Payer: Self-pay

## 2023-05-20 ENCOUNTER — Telehealth (HOSPITAL_COMMUNITY): Payer: Medicaid Other | Admitting: Psychiatry

## 2023-05-20 VITALS — BP 160/100 | HR 68

## 2023-05-20 DIAGNOSIS — Z0130 Encounter for examination of blood pressure without abnormal findings: Secondary | ICD-10-CM | POA: Diagnosis not present

## 2023-05-20 DIAGNOSIS — E7800 Pure hypercholesterolemia, unspecified: Secondary | ICD-10-CM | POA: Diagnosis not present

## 2023-05-20 DIAGNOSIS — F3130 Bipolar disorder, current episode depressed, mild or moderate severity, unspecified: Secondary | ICD-10-CM

## 2023-05-20 MED ORDER — QUETIAPINE FUMARATE 100 MG PO TABS: 100.0000 mg | ORAL_TABLET | Freq: Every day | ORAL | 3 refills | Status: DC

## 2023-05-20 MED ORDER — HYDROXYZINE PAMOATE 50 MG PO CAPS: 50.0000 mg | ORAL_CAPSULE | Freq: Three times a day (TID) | ORAL | 3 refills | Status: DC | PRN

## 2023-05-20 MED ORDER — LAMOTRIGINE 150 MG PO TABS
150.0000 mg | ORAL_TABLET | Freq: Every day | ORAL | 3 refills | Status: DC
Start: 2023-05-20 — End: 2023-07-08

## 2023-05-20 NOTE — Progress Notes (Unsigned)
Patient reported that she is taking bp medication consistently. ?

## 2023-05-20 NOTE — Telephone Encounter (Signed)
Patient called the clinic today requesting virtual visit.  Patient unable to logon virtually.  Provider informed patient that telephone visits were no longer permitted.  She endorsed understanding.  Today she notes that her mood is stable with the increase in Lamictal.  She also notes that her sleep has improved since increasing Seroquel.  Patient informed Clinical research associate that she is less anxious now that she and her husband are separated.  Today she denies SI/HI/AVH, mania, or paranoia.  No medication changes made today.  Patient agreeable to continue medications as prescribed.

## 2023-05-21 LAB — LIPID PANEL W/O CHOL/HDL RATIO
Cholesterol, Total: 155 mg/dL (ref 100–199)
HDL: 51 mg/dL (ref >39)
LDL Chol Calc (NIH): 73 mg/dL (ref 0–99)
Triglycerides: 188 mg/dL — ABNORMAL HIGH (ref 0–149)
VLDL Cholesterol Cal: 31 mg/dL (ref 5–40)

## 2023-05-21 MED ORDER — BLOOD GLUCOSE TEST VI STRP: ORAL_STRIP | 11 refills | Status: DC

## 2023-05-21 MED ORDER — LANCETS MISC. MISC: 11 refills | Status: DC

## 2023-05-21 MED ORDER — CLEVER CHOICE BP MONITOR/ARM DEVI
0 refills | Status: DC
Start: 2023-05-21 — End: 2023-11-28

## 2023-05-21 MED ORDER — BLOOD GLUCOSE MONITORING SUPPL DEVI: 0 refills | Status: DC

## 2023-05-21 MED ORDER — LANCET DEVICE MISC
0 refills | Status: DC
Start: 2023-05-21 — End: 2023-11-28

## 2023-05-22 NOTE — Progress Notes (Unsigned)
Check with pharmacy about whether picking up meds appropriately. If she is getting meds appropriately, double up on Carvedilol to 6.25 mg twice daily.

## 2023-06-02 DIAGNOSIS — M5416 Radiculopathy, lumbar region: Secondary | ICD-10-CM | POA: Insufficient documentation

## 2023-06-02 DIAGNOSIS — M722 Plantar fascial fibromatosis: Secondary | ICD-10-CM | POA: Insufficient documentation

## 2023-06-03 ENCOUNTER — Ambulatory Visit: Payer: Medicaid Other | Admitting: Internal Medicine

## 2023-06-03 ENCOUNTER — Telehealth: Payer: Self-pay

## 2023-06-03 ENCOUNTER — Encounter: Payer: Self-pay | Admitting: Internal Medicine

## 2023-06-03 VITALS — BP 136/70 | HR 72 | Resp 16 | Ht 62.0 in | Wt 206.0 lb

## 2023-06-03 DIAGNOSIS — Z1231 Encounter for screening mammogram for malignant neoplasm of breast: Secondary | ICD-10-CM | POA: Diagnosis not present

## 2023-06-03 DIAGNOSIS — Z789 Other specified health status: Secondary | ICD-10-CM | POA: Diagnosis not present

## 2023-06-03 DIAGNOSIS — R0683 Snoring: Secondary | ICD-10-CM

## 2023-06-03 DIAGNOSIS — R0600 Dyspnea, unspecified: Secondary | ICD-10-CM

## 2023-06-03 DIAGNOSIS — Z720 Tobacco use: Secondary | ICD-10-CM | POA: Diagnosis not present

## 2023-06-03 DIAGNOSIS — E669 Obesity, unspecified: Secondary | ICD-10-CM

## 2023-06-03 NOTE — Telephone Encounter (Signed)
Patient would like to be on a waiting list for a CPE appointment sooner than her original appointment on 09/24/2023

## 2023-06-03 NOTE — Progress Notes (Signed)
Subjective:    Patient ID: Cheyenne Fowler, female   DOB: [birth year], 57 y.o.   MRN: [id number]   HPI   Snoring:  Waiting for her to complete cardiac work up.    2.   Dyspnea:  Cardiac work up negative for CAD.  States for whatever reason, her breathing is better.  She has cut back to 1 or fewer cigarettes daily.  She found a couple cigarettes laying around and had to use them.    3.  She sent her husband out to get treatment for alcohol and drug treatment after 58 years of marriage.  She drinks less when he isn't around to drink.    4.  Alcohol Use Disorder:  she has not been drinking much--a 40 oz lasted her 3 days recently.   5.  Hyperlipidemia:  She is not very physically active, though does mow the lawn.  She can make changes to continue decrease with alcohol and cranberry juice.  Occasional fried, fatty foods.   Lipid Panel     Component Value Date/Time   CHOL 155 05/20/2023 1003   TRIG 188 (H) 05/20/2023 1003   TRIG 125 04/04/2016 1048   HDL 51 05/20/2023 1003   CHOLHDL 4.3 07/03/2018 1208   VLDL 31 07/03/2018 1208   LDLCALC 73 05/20/2023 1003   LABVLDL 31 05/20/2023 1003      Current Meds  Medication Sig   aspirin EC 81 MG tablet Take 1 tablet (81 mg total) by mouth daily. Swallow whole.   atorvastatin (LIPITOR) 80 MG tablet Take 1 tablet (80 mg total) by mouth daily.   carvedilol (COREG) 3.125 MG tablet Take 1 tablet (3.125 mg total) by mouth 2 (two) times daily with a meal.   cetirizine (ZYRTEC) 10 MG tablet Take 1 tablet (10 mg total) by mouth daily.   hydrOXYzine (VISTARIL) 50 MG capsule Take 1 capsule (50 mg total) by mouth 3 (three) times daily as needed.   ibuprofen (ADVIL) 800 MG tablet Take 800 mg by mouth as needed for moderate pain.   isosorbide mononitrate (IMDUR) 30 MG 24 hr tablet Take 1 tablet (30 mg total) by mouth daily.   lamoTRIgine (LAMICTAL) 150 MG tablet Take 1 tablet (150 mg total) by mouth daily.   losartan (COZAAR) 50 MG tablet Take 1  tablet (50 mg total) by mouth daily.   metFORMIN (GLUCOPHAGE-XR) 500 MG 24 hr tablet TAKE 1 TABLET BY MOUTH TWICE DAILY WITH MEALS   pantoprazole (PROTONIX) 40 MG tablet Take 1 tablet by mouth twice daily   QUEtiapine (SEROQUEL) 100 MG tablet Take 1 tablet (100 mg total) by mouth at bedtime.   Allergies  Allergen Reactions   Penicillins     REACTION: hives   Sulfonamide Derivatives     REACTION: hives     Review of Systems    Objective:   BP 136/70 (BP Location: Left Arm, Patient Position: Sitting, Cuff Size: Normal)   Pulse 72   Resp 16   Ht 5\' 2"  (1.575 m)   Wt 206 lb (93.4 kg)   LMP 06/09/2018 (Exact Date) Comment: Bleeding one month ago.  BMI 37.68 kg/m   Physical Exam NAD Nasal congestion Neck:  Supple, No adenopathy Chest:  CTA CV:  RRR without murmur or rub.  Radial pulses normal and equal   Assessment & Plan   Dyspnea:  Cardiac cath without stenoses.  Her dyspnea has improved--not clear of cause.  2.  Tobacco abuse disorder:  Encouraged her  to just not bring any cigarettes into the home.  3.  Alcohol use:  discussed alcohol as being an issue with her DM and cholesterol.  She may be more willing to just stop completely.    4.  Hyperlipidemia:  much improved now she is taking Atorvastatin, though not quite at goal.    5.  Snoring:  with cardiac work up complete, refer to sleep center to assess for OSA  6.  DM:  well controlled with check in March.  7.  HM:  Mammogram and schedule CPE without pap  8.  Housing insecurity:  [place], [place] meeting with her now for case management.  Patient has had difficulty with follow through.

## 2023-06-16 ENCOUNTER — Ambulatory Visit
Admission: RE | Admit: 2023-06-16 | Discharge: 2023-06-16 | Disposition: A | Discharge status: Home / Self Care | Payer: Medicaid Other | Source: Ambulatory Visit | Attending: Internal Medicine | Admitting: Internal Medicine

## 2023-06-16 DIAGNOSIS — Z1231 Encounter for screening mammogram for malignant neoplasm of breast: Secondary | ICD-10-CM

## 2023-06-24 ENCOUNTER — Ambulatory Visit (INDEPENDENT_AMBULATORY_CARE_PROVIDER_SITE_OTHER): Payer: MEDICAID | Admitting: Clinical

## 2023-06-24 DIAGNOSIS — F3130 Bipolar disorder, current episode depressed, mild or moderate severity, unspecified: Secondary | ICD-10-CM

## 2023-06-26 NOTE — Progress Notes (Signed)
   THERAPIST PROGRESS NOTE  Session Time: 30 minutes  Participation Level: Active  Behavioral Response: CasualAlertEuthymic  Type of Therapy: Individual Therapy  Treatment Goals addressed: client will complete 80% of assigned homework  ProgressTowards Goals: Progressing  Interventions: CBT and Supportive  Summary:  Cheyenne Fowler is a 57 y.o. female who presents for the scheduled appointment oriented times five, appropriately dressed and friendly. Client denied hallucinations and delusions. Client reported on today she has been doing a lot and keeping busy. Client reported although she is separated from her husband she has been showing up to support him with his health. Client reported she still wants hi to attend MH and SA treatments after his is better physically so they can work on their marriage. Client reported otherwise she is doing well and handling her affairs well. Evidence of progress towards goal:  client reported using family support 7 days per week.   Suicidal/Homicidal: Nowithout intent/plan  Therapist Response:  Therapist began the appointment asking the client how she has been doing. Therapist used cbt to engage using active listening and positive emotional support. Therapist used cbt to engage and give the client time to discuss her stress regarding family. Therapist used cbt to normalize the clients emotional response and discuss self care. Therapist used CBT ask the client to identify her progress with frequency of use with coping skills with continued practice in her daily activity.    Therapist assigned the client homework to practice self care.   Plan: Return again in 4 weeks.  Diagnosis: bipolar 1 most recent episode depressed  Collaboration of Care: Patient refused AEB none requested.  Patient/Guardian was advised Release of Information must be obtained prior to any record release in order to collaborate their care with an outside provider.  Patient/Guardian was advised if they have not already done so to contact the registration department to sign all necessary forms in order for [place] to release information regarding their care.   Consent: Patient/Guardian gives verbal consent for treatment and assignment of benefits for services provided during this visit. Patient/Guardian expressed understanding and agreed to proceed.   Neena Rhymes Irvine Glorioso, LCSW 06/24/2023

## 2023-07-08 ENCOUNTER — Ambulatory Visit (INDEPENDENT_AMBULATORY_CARE_PROVIDER_SITE_OTHER): Payer: MEDICAID | Admitting: Psychiatry

## 2023-07-08 ENCOUNTER — Encounter (HOSPITAL_COMMUNITY): Payer: Self-pay | Admitting: Psychiatry

## 2023-07-08 DIAGNOSIS — F3130 Bipolar disorder, current episode depressed, mild or moderate severity, unspecified: Secondary | ICD-10-CM | POA: Diagnosis not present

## 2023-07-08 MED ORDER — LAMOTRIGINE 150 MG PO TABS
150.0000 mg | ORAL_TABLET | Freq: Every day | ORAL | 3 refills | Status: DC
Start: 2023-07-08 — End: 2023-10-07

## 2023-07-08 MED ORDER — QUETIAPINE FUMARATE 100 MG PO TABS: 100.0000 mg | ORAL_TABLET | Freq: Every day | ORAL | 3 refills | Status: DC

## 2023-07-08 MED ORDER — HYDROXYZINE PAMOATE 50 MG PO CAPS: 50.0000 mg | ORAL_CAPSULE | Freq: Three times a day (TID) | ORAL | 3 refills | Status: DC | PRN

## 2023-07-08 NOTE — Progress Notes (Signed)
BH MD/PA/NP OP Progress Note         07/08/2023 12:50 PM Cheyenne Fowler  MRN:  [id number]  Chief Complaint:  "The medication makes me calm"   HPI: 57 year-old female seen today for follow-up psychiatric evaluation. She has a psychiatric history of polysubstance use (cocaine, alcohol), depression, and bipolar disorder. She was being managed on Lamictal 150 mg daily, Seroquel 100 mg nightly, and Vistaril 50mg  3 times daily as needed. She reports that her medications are effective in managing her psychiatric conditions.  Today she was  well groomed, pleasant, cooperative, engaged in conversation and maintained eye contact.  She informed Clinical research associate that her medications make her calm. She notes that she is less irritable and is able to cope with life stressors better. She notes recently she found out that her husband has cancer. He recently has an emergency procedure on his colon. Patient notes that she and her husband had marital issues due to his drinking and lack of employment. She however reports that things are better as they have been focusing on his health. Patient continues to live with her daughter and notes that things are going well.  She informed Clinical research associate that she enjoys spending time with her grandchildren.  Patient notes that the above worsens her anxiety but reports that it is situational. She notes that she can deal with her anxiety.  Today provider conducted a GAD-7 and patient scored 20, at her last visit she scored a 20.  Provider also conducted PHQ-9 and patient scored a 7, at her last visit she scored a 24.  She endorses adequate sleep and appetite.  Today she denies SI/HI/VAH, mania, or paranoia.    Patient informed Clinical research associate that she has a IT trainer who has been very influential in helping her with medical needs.  She informed Clinical research associate that recently her insurance attempted to change her PCP but notes that her caseworker is fighting to keep her current PCP.  She also reports that her  caseworker assisted her in getting her husband Medicaid.  Patient informed writer that at times she has back pain which she quantifies as 5 out of 10.  She however reports that physical therapy has improved her pain.  She informed Clinical research associate that she has been walking, cleaning, and staying active to manage her health.  She informed Clinical research associate that she has diabetes and is trying to rectify it with diet and exercise.  No medication changes made today.  Patient agreeable to continue medications as prescribed.  No other concerns noted at this time.  Visit Diagnosis:    ICD-10-CM   1. Bipolar I disorder, most recent episode depressed (HCC)  F31.30 hydrOXYzine (VISTARIL) 50 MG capsule    QUEtiapine (SEROQUEL) 100 MG tablet    lamoTRIgine (LAMICTAL) 150 MG tablet       Past Psychiatric History: Depression, substance induced mood disorder, Bipolar disorder,  Past Medical History:  Past Medical History:  Diagnosis Date   Anemia    Bipolar disorder (HCC)    Diabetes mellitus (HCC) 2022   type 2   Environmental and seasonal allergies 1968   GERD (gastroesophageal reflux disease)    Heart murmur    Hypertension    Upper GI bleed 01/15/2022    Past Surgical History:  Procedure Laterality Date   BIOPSY  01/17/2022   Procedure: BIOPSY;  Surgeon: Tressia Danas, MD;  Location: [facility] ENDOSCOPY;  Service: Gastroenterology;;   CESAREAN SECTION  1982, 1983, 1990   ESOPHAGOGASTRODUODENOSCOPY (EGD) WITH PROPOFOL N/A 01/17/2022  Procedure: ESOPHAGOGASTRODUODENOSCOPY (EGD) WITH PROPOFOL;  Surgeon: Tressia Danas, MD;  Location[facility] ENDOSCOPY;  Service: Gastroenterology;  Laterality: N/A;   HERNIA REPAIR     Umbilical hernia in the 2nd grade   LEFT HEART CATH AND CORONARY ANGIOGRAPHY N/A 05/06/2023   Procedure: LEFT HEART CATH AND CORONARY ANGIOGRAPHY;  Surgeon: Tonny Bollman, MD;  Location: [facility] INVASIVE CV LAB;  Service: Cardiovascular;  Laterality: N/A;   TUBAL LIGATION  1990    Family Psychiatric  History: Brother schizophrenia and bipolar, sister bipolar disorder  Family History:  Family History  Problem Relation Age of Onset   Diabetes Mother    Cancer Mother        liver--not clear if this was the primary   COPD Mother    Bipolar disorder Sister    Diabetes Sister    Schizophrenia Brother    Bipolar disorder Brother    Cancer Brother        Not sure of primary   Cancer Maternal Aunt        Breast cancer   Breast cancer Maternal Aunt        Cause of death   Cancer Maternal Aunt        pancreatic   Cancer Maternal Grandfather        throat   Colon cancer Neg Hx    Colon polyps Neg Hx    Esophageal cancer Neg Hx    Stomach cancer Neg Hx    Rectal cancer Neg Hx     Social History:  Social History   Socioeconomic History   Marital status: Married    Spouse name: Evern Bio   Number of children: 3   Years of education: Not on file   Highest education level: GED or equivalent  Occupational History   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  Was drinking every day prior.  Was drinking due to stress.She and husband drink every evening.  Her counselor, Page, at [employer] is aware of her drinking.   Drug use: Not Currently    Types: Marijuana, "Crack" cocaine    Comment: Last cocaine use was 30 days ago.  No MJ for 2 months.   Sexual activity: Yes    Birth control/protection: Surgical  Other Topics Concern   Not on file  Social History Narrative   She and her husband live with her son and her granddaughter.    Born and raised in [place]    Social Determinants of Health   Financial  Resource Strain: Low Risk  (01/23/2022)   Overall Financial Resource Strain (CARDIA)    Difficulty of Paying Living Expenses: Not very hard  Food Insecurity: No Food Insecurity (01/23/2022)   Hunger Vital Sign    Worried About Running Out of Food in the Last Year: Never true    Ran Out of Food in the Last Year: Never true  Transportation Needs: No Transportation Needs (01/23/2022)   PRAPARE - Administrator, Civil Service (Medical): No    Lack of Transportation (Non-Medical): No  Physical Activity: Not on file  Stress: Not on file  Social Connections: Not on file    Allergies:  Allergies  Allergen Reactions   Penicillins     REACTION: hives   Sulfonamide Derivatives     REACTION: hives    Metabolic Disorder Labs: Lab Results  Component Value Date   HGBA1C 6.0 (H) 03/19/2023   MPG 119.76 07/03/2018   MPG 137 (H) 07/14/2012   No results found for: "PROLACTIN" Lab Results  Component Value Date   CHOL 155 05/20/2023   TRIG 188 (H) 05/20/2023   HDL 51 05/20/2023   CHOLHDL 4.3 07/03/2018   VLDL 31 07/03/2018   LDLCALC 73 05/20/2023   LDLCALC 110 (H) 03/19/2023   Lab Results  Component Value Date   TSH 0.828 05/23/2012    Therapeutic Level Labs: No results found for: "LITHIUM" No results found for: "VALPROATE" No results found for: "CBMZ"  Current Medications: Current Outpatient Medications  Medication Sig Dispense Refill   aspirin EC 81 MG tablet Take 1 tablet (81 mg total) by mouth daily. Swallow whole. 90 tablet 12   atorvastatin (LIPITOR) 80 MG tablet Take 1 tablet (80 mg total) by mouth daily. 30 tablet 11   Blood Glucose Monitoring Suppl DEVI Check blood glucose twice daily before meals (Patient not taking: Reported on 06/03/2023) 1 each 0   Blood Pressure Monitoring (CLEVER CHOICE BP MONITOR/ARM) DEVI Use to check blood pressure 3 times weekly and as needed (Patient not taking: Reported on 06/03/2023) 1 each 0   carvedilol (COREG) 3.125 MG tablet Take 1  tablet (3.125 mg total) by mouth 2 (two) times daily with a meal. 60 tablet 11   cetirizine (ZYRTEC) 10 MG tablet Take 1 tablet (10 mg total) by mouth daily. 30 tablet 11   Glucose Blood (BLOOD GLUCOSE TEST STRIPS) STRP Check blood glucose twice daily before meals (Patient not taking: Reported on 06/03/2023) 100 strip 11   hydrOXYzine (VISTARIL) 50 MG capsule Take 1 capsule (50 mg total) by mouth 3 (three) times daily as needed. 90 capsule 3   ibuprofen (ADVIL) 800 MG tablet Take 800 mg by mouth as needed for moderate pain.     isosorbide mononitrate (IMDUR) 30 MG 24 hr tablet Take 1 tablet (30 mg total) by mouth daily. 90 tablet 3   lamoTRIgine (LAMICTAL) 150 MG tablet Take 1 tablet (150 mg total) by mouth daily. 30 tablet 3   Lancet Device MISC Check blood glucose twice daily before meals (Patient not taking: Reported on 06/03/2023) 1 each 0   Lancets Misc. MISC Check blood glucose twice daily before meals (Patient not taking: Reported on 06/03/2023) 100 each 11   losartan (COZAAR) 50 MG tablet Take 1 tablet (50 mg total) by mouth daily. 30 tablet 11   metFORMIN (GLUCOPHAGE-XR) 500 MG 24 hr tablet TAKE 1 TABLET BY MOUTH TWICE DAILY WITH MEALS 60 tablet 11   nitroGLYCERIN (NITROSTAT) 0.4 MG SL tablet Place 1 tablet (0.4 mg total) under the tongue every 5 (five) minutes as needed for chest pain. (Patient not taking: Reported on 06/03/2023) 25 tablet 1   pantoprazole (PROTONIX) 40 MG tablet Take 1 tablet by mouth twice daily 180 tablet 0   QUEtiapine (SEROQUEL) 100 MG tablet Take 1 tablet (100 mg total) by mouth at bedtime. 30 tablet 3   No current facility-administered medications for this visit.     Musculoskeletal: Strength & Muscle Tone: within normal limits Gait & Station: normal Patient leans: N/A  Psychiatric Specialty Exam: Review of Systems  Last  menstrual period 06/09/2018.There is no height or weight on file to calculate BMI.  General Appearance: Well Groomed  Eye Contact:  Good   Speech:  Clear and Coherent and Normal Rate  Volume:  Normal  Mood:  Anxious  Affect:  Congruent  Thought Process:  Coherent, Goal Directed, and Linear  Orientation:  Full (Time, Place, and Person)  Thought Content: WDL and Logical   Suicidal Thoughts:  No  Homicidal Thoughts:  No  Memory:  Immediate;   Good Recent;   Good Remote;   Good  Judgement:  Good  Insight:  Good  Psychomotor Activity:  Normal  Concentration:  Concentration: Good and Attention Span: Good  Recall:  Good  Fund of Knowledge: Good  Language: Good  Akathisia:  No  Handed:  Right  AIMS (if indicated): not done,   Assets:  Communication Skills Desire for Improvement Housing Leisure Time Social Support  ADL's:  Intact  Cognition: WNL  Sleep:  Good   Screenings: GAD-7    Flowsheet Row Clinical Support from 07/08/2023 in [facility] Video Visit from 01/15/2023 in [facility] Video Visit from 02/06/2022 in [facility] Clinical Support from 11/12/2021 in [facility] Video Visit from 10/12/2020 in [facility]  Total GAD-7 Score 20 20 16 21 17       PHQ2-9    Flowsheet Row Clinical Support from 07/08/2023 in [facility] Video Visit from 01/15/2023 in [facility] Video Visit from 02/06/2022 in [facility] Clinical Support from 11/12/2021 in [facility] Video Visit from 10/12/2020 in [facility]  PHQ-2 Total Score 2 5 5 6 4   PHQ-9 Total Score 7 24 7 24 18       Flowsheet Row Admission (Discharged) from 05/06/2023 in [facility] CARDIAC CATH LAB ED from 02/23/2023 in [facility]) Video Visit from 01/15/2023 in [facility]  C-SSRS RISK CATEGORY No  Risk No Risk Error: Q7 should not be populated when Q6 is No        Assessment and Plan: Patient notes that her depression and sleep continues to improve.  She continues to have anxiety but reports that this is situational.No medication changes made today.  Patient agreeable to continue medications as prescribed. 1. Bipolar I disorder, most recent episode depressed (HCC)  Continue- hydrOXYzine (VISTARIL) 50 MG capsule; Take 1 capsule (50 mg total) by mouth 3 (three) times daily as needed.  Dispense: 90 capsule; Refill: 3 Increased- lamoTRIgine (LAMICTAL) 150 MG tablet; Take 1 tablet (150 mg total) by mouth daily.  Dispense: 30 tablet; Refill: 3 Continue- QUEtiapine (SEROQUEL) 100 MG tablet; Take 1 tablet (100 mg total) by mouth at bedtime.  Dispense: 30 tablet; Refill: 3     Follow-up in 3 months Shanna Cisco, NP 07/08/2023, 12:50 PM

## 2023-07-15 ENCOUNTER — Telehealth: Payer: Self-pay

## 2023-07-15 ENCOUNTER — Ambulatory Visit (HOSPITAL_COMMUNITY): Payer: MEDICAID | Admitting: Clinical

## 2023-07-15 NOTE — Telephone Encounter (Signed)
Patient wanted to know if she can switch from nicotine gum to nicotine patches.

## 2023-07-31 ENCOUNTER — Encounter: Payer: Self-pay | Admitting: Cardiovascular Disease

## 2023-07-31 NOTE — Progress Notes (Signed)
Cardiology Office Note:    Date:  07/31/2023   ID:  Cheyenne Fowler, DOB 1966-02-22, MRN [id number]  PCP:  Julieanne Manson, MD   [facility] Providers Cardiologist: Malita Ignasiak  Click to update primary MD,subspecialty MD or APP then REFRESH:1}    Referring MD: Julieanne Manson, MD   Chief Complaint  Patient presents with   Chest Pain     History of Present Illness:    Cheyenne Fowler is a 57 y.o. female with a hx of htn, murmur   She recently saw Dr. Delrae Alfred for some DOE  Is still having DOE  Associated with intrascapular pain Dr. Delrae Alfred gave her NTG with seems to help   Also has exercise induced chest pain .  Associated with this intrascapular pan   Still smoking ,  1/2 ppd Trying to cut back / quit now , now smokes 2 cigarettes a day   Wt is 205.  Has lost 10 lbs over the past several weeks    Aug. 9, 2024 Cheyenne Fowler was seen several months ago for an episode of CP Hx of smoking  Cath revealed no obstructive CAD     Past Medical History:  Diagnosis Date   Anemia    Bipolar disorder (HCC)    Diabetes mellitus (HCC) 2022   type 2   Environmental and seasonal allergies 1968   GERD (gastroesophageal reflux disease)    Heart murmur    Hypertension    Upper GI bleed 01/15/2022    Past Surgical History:  Procedure Laterality Date   BIOPSY  01/17/2022   Procedure: BIOPSY;  Surgeon: Tressia Danas, MD;  Location: [facility] ENDOSCOPY;  Service: Gastroenterology;;   CESAREAN SECTION  1982, 1983, 1990   ESOPHAGOGASTRODUODENOSCOPY (EGD) WITH PROPOFOL N/A 01/17/2022   Procedure: ESOPHAGOGASTRODUODENOSCOPY (EGD) WITH PROPOFOL;  Surgeon: Tressia Danas, MD;  Location: [facility] ENDOSCOPY;  Service: Gastroenterology;  Laterality: N/A;   HERNIA REPAIR     Umbilical hernia in the 2nd grade   LEFT HEART CATH AND CORONARY ANGIOGRAPHY N/A 05/06/2023   Procedure: LEFT HEART CATH AND CORONARY ANGIOGRAPHY;  Surgeon: Tonny Bollman, MD;  Location: [facility] INVASIVE CV LAB;   Service: Cardiovascular;  Laterality: N/A;   TUBAL LIGATION  1990    Current Medications: No outpatient medications have been marked as taking for the 08/01/23 encounter (Office Visit) with Jenica Costilow, Deloris Ping, MD.     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  Was drinking every day prior.  Was drinking due to stress.She and husband drink every evening.  Her counselor, Page, at [employer] is aware of her drinking.   Drug use: Not Currently    Types: Marijuana, "Crack" cocaine    Comment: Last cocaine use was 30 days ago.  No MJ for 2 months.  Sexual activity: Yes    Birth control/protection: Surgical  Other Topics Concern   Not on file  Social History Narrative   She and her husband live with her son and her granddaughter.    Born and raised in [place]    Social Determinants of Health   Financial Resource Strain: Low Risk  (01/23/2022)   Overall Financial Resource Strain (CARDIA)    Difficulty of Paying Living Expenses: Not very hard  Food Insecurity: No Food Insecurity (01/23/2022)   Hunger Vital Sign    Worried About Running Out of Food in the Last Year: Never true    Ran Out of Food in the Last Year: Never true  Transportation Needs: No Transportation Needs (01/23/2022)   PRAPARE - Administrator, Civil Service  (Medical): No    Lack of Transportation (Non-Medical): No  Physical Activity: Not on file  Stress: Not on file  Social Connections: Not on file     Family History: The patient's family history includes Bipolar disorder in her brother and sister; Breast cancer in her maternal aunt; COPD in her mother; Cancer in her brother, maternal aunt, maternal aunt, maternal grandfather, and mother; Diabetes in her mother and sister; Schizophrenia in her brother. There is no history of Colon cancer, Colon polyps, Esophageal cancer, Stomach cancer, or Rectal cancer.  ROS:   Please see the history of present illness.     All other systems reviewed and are negative.  EKGs/Labs/Other Studies Reviewed:    The following studies were reviewed today:   ECG :        Recent Labs: 03/19/2023: ALT 21 04/30/2023: BUN 14; Creatinine, Ser 0.85; Hemoglobin 14.1; Platelets 285; Potassium 4.3; Sodium 145  Recent Lipid Panel    Component Value Date/Time   CHOL 155 05/20/2023 1003   TRIG 188 (H) 05/20/2023 1003   TRIG 125 04/04/2016 1048   HDL 51 05/20/2023 1003   CHOLHDL 4.3 07/03/2018 1208   VLDL 31 07/03/2018 1208   LDLCALC 73 05/20/2023 1003     Risk Assessment/Calculations:      No BP recorded.  {Refresh Note OR Click here to enter BP  :1}***         Physical Exam: Last menstrual period 06/09/2018.  No BP recorded.  {Refresh Note OR Click here to enter BP  :1}***    GEN:  Well nourished, well developed in no acute distress HEENT: Normal NECK: No JVD; No carotid bruits LYMPHATICS: No lymphadenopathy CARDIAC: RRR ***, no murmurs, rubs, gallops RESPIRATORY:  Clear to auscultation without rales, wheezing or rhonchi  ABDOMEN: Soft, non-tender, non-distended MUSCULOSKELETAL:  No edema; No deformity  SKIN: Warm and dry NEUROLOGIC:  Alert and oriented x 3   ASSESSMENT:    No diagnosis found.  PLAN:       Unstable angina:      Medication Adjustments/Labs and Tests Ordered: Current  medicines are reviewed at length with the patient today.  Concerns regarding medicines are outlined above.  No orders of the defined types were placed in this encounter.  No orders of the defined types were placed in this encounter.   There are no Patient Instructions on file for this visit.   Signed, Kristeen Miss, MD  07/31/2023 7:59 PM    [facility]

## 2023-08-01 ENCOUNTER — Ambulatory Visit: Payer: MEDICAID | Attending: Cardiovascular Disease | Admitting: Cardiovascular Disease

## 2023-08-01 ENCOUNTER — Encounter: Payer: Self-pay | Admitting: Cardiovascular Disease

## 2023-08-07 NOTE — Progress Notes (Signed)
Cardiology Office Note:    Date:  08/08/2023   ID:  Cheyenne Fowler, DOB 08/09/1966, MRN [id number]  PCP:  Julieanne Manson, MD   [facility] Providers Cardiologist: Aryannah Mohon  Click to update primary MD,subspecialty MD or APP then REFRESH:1}    Referring MD: Julieanne Manson, MD   No chief complaint on file.    History of Present Illness:    Cheyenne Fowler is a 57 y.o. female with a hx of htn, murmur   She recently saw Dr. Delrae Alfred for some DOE  Is still having DOE  Associated with intrascapular pain Dr. Delrae Alfred gave her NTG with seems to help   Also has exercise induced chest pain .  Associated with this intrascapular pan   Still smoking ,  1/2 ppd Trying to cut back / quit now , now smokes 2 cigarettes a day   Wt is 205.  Has lost 10 lbs over the past several weeks    Aug. 9, 2024 No show   Aug . 16, 2024  Cheyenne Fowler was seen several months ago for an episode of CP Hx of smoking  Cath revealed no obstructive CAD Wt is 211 lbs   She is doing well Cath site looks good  She may regurn to see [place] as needed.    Past Medical History:  Diagnosis Date   Anemia    Bipolar disorder (HCC)    Diabetes mellitus (HCC) 2022   type 2   Environmental and seasonal allergies 1968   GERD (gastroesophageal reflux disease)    Heart murmur    Hypertension    Upper GI bleed 01/15/2022    Past Surgical History:  Procedure Laterality Date   BIOPSY  01/17/2022   Procedure: BIOPSY;  Surgeon: Tressia Danas, MD;  Location: [facility] ENDOSCOPY;  Service: Gastroenterology;;   CESAREAN SECTION  1982, 1983, 1990   ESOPHAGOGASTRODUODENOSCOPY (EGD) WITH PROPOFOL N/A 01/17/2022   Procedure: ESOPHAGOGASTRODUODENOSCOPY (EGD) WITH PROPOFOL;  Surgeon: Tressia Danas, MD;  Location: [facility] ENDOSCOPY;  Service: Gastroenterology;  Laterality: N/A;   HERNIA REPAIR     Umbilical hernia in the 2nd grade   LEFT HEART CATH AND CORONARY ANGIOGRAPHY N/A 05/06/2023   Procedure: LEFT  HEART CATH AND CORONARY ANGIOGRAPHY;  Surgeon: Tonny Bollman, MD;  Location: [facility] INVASIVE CV LAB;  Service: Cardiovascular;  Laterality: N/A;   TUBAL LIGATION  1990    Current Medications: Current Meds  Medication Sig   aspirin EC 81 MG tablet Take 1 tablet (81 mg total) by mouth daily. Swallow whole.   atorvastatin (LIPITOR) 80 MG tablet Take 1 tablet (80 mg total) by mouth daily.   carvedilol (COREG) 3.125 MG tablet Take 1 tablet (3.125 mg total) by mouth 2 (two) times daily with a meal.   cetirizine (ZYRTEC) 10 MG tablet Take 1 tablet (10 mg total) by mouth daily.   hydrOXYzine (VISTARIL) 50 MG capsule Take 1 capsule (50 mg total) by mouth 3 (three) times daily as needed.   ibuprofen (ADVIL) 800 MG tablet Take 800 mg by mouth as needed for moderate pain.   isosorbide mononitrate (IMDUR) 30 MG 24 hr tablet Take 1 tablet (30 mg total) by mouth daily.   lamoTRIgine (LAMICTAL) 150 MG tablet Take 1 tablet (150 mg total) by mouth daily.   losartan (COZAAR) 50 MG tablet Take 1 tablet (50 mg total) by mouth daily.   metFORMIN (GLUCOPHAGE-XR) 500 MG 24 hr tablet TAKE 1 TABLET BY MOUTH TWICE DAILY WITH MEALS   pantoprazole (PROTONIX)  40 MG tablet Take 1 tablet by mouth twice daily   QUEtiapine (SEROQUEL) 100 MG tablet Take 1 tablet (100 mg total) by mouth at bedtime.     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  Was drinking every day prior.  Was drinking due to stress.She and husband drink every evening.  Her counselor, Page, at [employer] is aware of her drinking.   Drug use: Not Currently    Types: Marijuana, "Crack" cocaine    Comment: Last cocaine use was 30 days ago.  No MJ for 2 months.   Sexual activity: Yes    Birth control/protection: Surgical  Other Topics Concern   Not on file  Social History Narrative   She and her husband live with her son and her granddaughter.    Born and raised in [place]    Social Determinants of Health   Financial Resource Strain: Low Risk  (01/23/2022)   Overall Financial Resource Strain (CARDIA)    Difficulty of Paying Living Expenses: Not very hard  Food Insecurity: No Food Insecurity (01/23/2022)   Hunger Vital Sign    Worried About Running Out of Food in the Last Year: Never true    Ran Out of Food in the Last Year: Never true  Transportation Needs: No Transportation Needs (01/23/2022)   PRAPARE - Administrator, Civil Service (Medical): No    Lack of Transportation (Non-Medical): No  Physical Activity: Not on file  Stress: Not on file  Social Connections: Not on file     Family History: The patient's family history includes Bipolar disorder in her brother and sister; Breast cancer in her maternal aunt; COPD in her mother; Cancer in her brother, maternal aunt, maternal aunt, maternal grandfather, and mother; Diabetes in her mother and sister; Schizophrenia in her brother. There is no history of Colon cancer, Colon polyps, Esophageal cancer, Stomach cancer, or Rectal cancer.  ROS:   Please see the history of present illness.     All other systems reviewed and are negative.  EKGs/Labs/Other Studies Reviewed:    The following studies were reviewed today:   ECG :        Recent Labs: 03/19/2023: ALT 21 04/30/2023: BUN 14; Creatinine, Ser 0.85; Hemoglobin 14.1;  Platelets 285; Potassium 4.3; Sodium 145  Recent Lipid Panel    Component Value Date/Time   CHOL 155 05/20/2023 1003   TRIG 188 (H) 05/20/2023 1003   TRIG 125 04/04/2016 1048   HDL 51 05/20/2023 1003   CHOLHDL 4.3 07/03/2018 1208   VLDL 31 07/03/2018 1208   LDLCALC 73 05/20/2023 1003     Risk Assessment/Calculations:                Physical Exam: Blood pressure 124/82, pulse 90, height  5\' 2"  (1.575 m), weight 211 lb 3.2 oz (95.8 kg), last menstrual period 06/09/2018, SpO2 98%.       GEN:  Well nourished, well developed in no acute distress HEENT: Normal NECK: No JVD; No carotid bruits LYMPHATICS: No lymphadenopathy CARDIAC: RRR , no murmurs, rubs, gallops RESPIRATORY:  Clear to auscultation without rales, wheezing or rhonchi  ABDOMEN: Soft, non-tender, non-distended MUSCULOSKELETAL:  No edema; No deformity , right radial cath site looks good.  Pulses intact. SKIN: Warm and dry NEUROLOGIC:  Alert and oriented x 3   ASSESSMENT:    No diagnosis found.  PLAN:       Chest pain: Patient had smooth and normal coronary arteries at cath.  I do not have an explanation for her chest pain.  It is possible that she had some coronary spasm.  At this time I think that the isosorbide is elective.  If the isosorbide seems to be helping with her chest pain then she certainly can continue it.  We will see her on an as-needed basis.    Medication Adjustments/Labs and Tests Ordered: Current medicines are reviewed at length with the patient today.  Concerns regarding medicines are outlined above.  No orders of the defined types were placed in this encounter.  No orders of the defined types were placed in this encounter.   Patient Instructions  Continue current meds  Coreg and Losartan for BP The Isosorbide 30 mg is ELECTIVE .   You do not have any heart blockages that would need the Isosorbide  Medication Instructions:  Your physician recommends that you continue on your  current medications as directed. Please refer to the Current Medication list given to you today.  *If you need a refill on your cardiac medications before your next appointment, please call your pharmacy*   Lab Work: NONE If you have labs (blood work) drawn today and your tests are completely normal, you will receive your results only by: MyChart Message (if you have MyChart) OR A paper copy in the mail If you have any lab test that is abnormal or we need to change your treatment, we will call you to review the results.   Testing/Procedures: NONE   Follow-Up: At [facility], you and your health needs are our priority.  As part of our continuing mission to provide you with exceptional heart care, we have created designated Provider Care Teams.  These Care Teams include your primary Cardiologist (physician) and Advanced Practice Providers (APPs -  Physician Assistants and Nurse Practitioners) who all work together to provide you with the care you need, when you need it.  We recommend signing up for the patient portal called "MyChart".  Sign up information is provided on this After Visit Summary.  MyChart is used to connect with patients for Virtual Visits (Telemedicine).  Patients are able to view lab/test results, encounter notes, upcoming appointments, etc.  Non-urgent messages can be sent to your provider as well.   To learn more about what you can do with MyChart, go to [email or web address].    Your next appointment:   As NEEDED  Provider:   Kristeen Miss, MD      Signed, Kristeen Miss, MD  08/08/2023 2:09 PM    [facility]

## 2023-08-08 ENCOUNTER — Ambulatory Visit: Payer: MEDICAID | Attending: Cardiovascular Disease | Admitting: Cardiovascular Disease

## 2023-08-08 ENCOUNTER — Encounter: Payer: Self-pay | Admitting: Cardiovascular Disease

## 2023-08-08 VITALS — BP 124/82 | HR 90 | Ht 62.0 in | Wt 211.2 lb

## 2023-08-08 DIAGNOSIS — I10 Essential (primary) hypertension: Secondary | ICD-10-CM | POA: Diagnosis not present

## 2023-08-08 NOTE — Patient Instructions (Addendum)
Continue current meds  Coreg and Losartan for BP The Isosorbide 30 mg is ELECTIVE .   You do not have any heart blockages that would need the Isosorbide  Medication Instructions:  Your physician recommends that you continue on your current medications as directed. Please refer to the Current Medication list given to you today.  *If you need a refill on your cardiac medications before your next appointment, please call your pharmacy*   Lab Work: NONE If you have labs (blood work) drawn today and your tests are completely normal, you will receive your results only by: MyChart Message (if you have MyChart) OR A paper copy in the mail If you have any lab test that is abnormal or we need to change your treatment, we will call you to review the results.   Testing/Procedures: NONE   Follow-Up: At [facility], you and your health needs are our priority.  As part of our continuing mission to provide you with exceptional heart care, we have created designated Provider Care Teams.  These Care Teams include your primary Cardiologist (physician) and Advanced Practice Providers (APPs -  Physician Assistants and Nurse Practitioners) who all work together to provide you with the care you need, when you need it.  We recommend signing up for the patient portal called "MyChart".  Sign up information is provided on this After Visit Summary.  MyChart is used to connect with patients for Virtual Visits (Telemedicine).  Patients are able to view lab/test results, encounter notes, upcoming appointments, etc.  Non-urgent messages can be sent to your provider as well.   To learn more about what you can do with MyChart, go to [email or web address].    Your next appointment:   As NEEDED  Provider:   Kristeen Miss, MD

## 2023-08-19 ENCOUNTER — Ambulatory Visit (INDEPENDENT_AMBULATORY_CARE_PROVIDER_SITE_OTHER): Payer: MEDICAID | Admitting: Clinical

## 2023-08-19 DIAGNOSIS — F3130 Bipolar disorder, current episode depressed, mild or moderate severity, unspecified: Secondary | ICD-10-CM | POA: Diagnosis not present
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
## 2023-09-02 ENCOUNTER — Ambulatory Visit (INDEPENDENT_AMBULATORY_CARE_PROVIDER_SITE_OTHER): Payer: MEDICAID | Admitting: Clinical

## 2023-09-02 DIAGNOSIS — F3130 Bipolar disorder, current episode depressed, mild or moderate severity, unspecified: Secondary | ICD-10-CM | POA: Diagnosis not present

## 2023-09-02 NOTE — Progress Notes (Signed)
   THERAPIST PROGRESS NOTE  Session Time: 45 minutes  Participation Level: Active  Behavioral Response: CasualAlertEuthymic  Type of Therapy: Individual Therapy  Treatment Goals addressed: Client will identify 3 cognitive patterns and beliefs that support depression  ProgressTowards Goals: Progressing  Interventions: CBT and Supportive  Summary:  Cheyenne Fowler is a 57 y.o. female who presents appointment oriented x 5, appropriately dressed, and friendly.  Client denied hallucinations and delusions. Client reported on today she is doing fairly okay.  Client reported her husband has not been stressing her out as much but she has gone through situation with her daughter recently.  Client reported she has been living with her daughter and daughters female partner for the past approximately 9 months.  Client reported within the past few weeks her daughter has gone through a messy break-up with her partner.  Client reported she has been working on saving money to get her own apartment but nothing was working out.  Client reported she now sees that maybe it was meant for her to stay with her daughter to help with her grandkids.  Client reported otherwise that she has been continuing to encourage her husband to take care of himself and get treatment that he needs.  Client reported she herself is doing fine. Evidence of progress towards goal: Client reported she is medication compliant 7 days a week.  Suicidal/Homicidal: Nowithout intent/plan  Therapist Response:  Therapist began the appointment asking the client how she has been doing since last seen. Therapist used CBT to engage using active listening and positive emotional support. Therapist used CBT to engage with the client and give her time to discuss stressors and transitions that are transpired. Therapist used CBT ask the client to identify her progress with frequency of use with coping skills with continued practice in her daily  activity.    Therapist used CBT to assigned client homework to work on self-care practices and boundaries.   Plan: Return again in 4 weeks.  Diagnosis: Bipolar 1 disorder most recent episode depressed  Collaboration of Care: Patient refused AEB none requested by the client.  Patient/Guardian was advised Release of Information must be obtained prior to any record release in order to collaborate their care with an outside provider. Patient/Guardian was advised if they have not already done so to contact the registration department to sign all necessary forms in order for [place] to release information regarding their care.   Consent: Patient/Guardian gives verbal consent for treatment and assignment of benefits for services provided during this visit. Patient/Guardian expressed understanding and agreed to proceed.   [name], LCSW 09/02/2023

## 2023-09-22 ENCOUNTER — Other Ambulatory Visit: Payer: MEDICAID

## 2023-09-22 ENCOUNTER — Ambulatory Visit (INDEPENDENT_AMBULATORY_CARE_PROVIDER_SITE_OTHER): Payer: MEDICAID | Admitting: Clinical

## 2023-09-22 DIAGNOSIS — F3130 Bipolar disorder, current episode depressed, mild or moderate severity, unspecified: Secondary | ICD-10-CM | POA: Diagnosis not present

## 2023-09-22 NOTE — Progress Notes (Signed)
   THERAPIST PROGRESS NOTE Virtual Visit via Telephone Note  I connected with Cheyenne Fowler on 09/22/23 at  1:00 PM EDT by telephone and verified that I am speaking with the correct person using two identifiers.  Location: Patient: home Provider: office   I discussed the limitations, risks, security and privacy concerns of performing an evaluation and management service by telephone and the availability of in person appointments. I also discussed with the patient that there may be a patient responsible charge related to this service. The patient expressed understanding and agreed to proceed.   Follow Up Instructions: I discussed the assessment and treatment plan with the patient. The patient was provided an opportunity to ask questions and all were answered. The patient agreed with the plan and demonstrated an understanding of the instructions.   The patient was advised to call back or seek an in-person evaluation if the symptoms worsen or if the condition fails to improve as anticipated.   Session Time: 30 minutes  Participation Level: Active  Behavioral Response: NAAlertAnxious  Type of Therapy: Individual Therapy  Treatment Goals addressed: Cheyenne Fowler WILL IDENTIFY 3 COGNITIVE PATTERNS AND BELIEFS THAT SUPPORT DEPRESSION   ProgressTowards Goals: Progressing  Interventions: CBT  Summary:  Cheyenne Fowler is a 57 y.o. female who presents for the scheduled appointment oriented times five, appropriately dressed and friendly. Client denied hallucinations and delusions. Client reported she has been having stress related to helping her daughter. Client reported her daughter was taken to jail related to her ex girlfriend filing a warrant for domestic abuse. Client reported her daughter spent the night in jail she was worried about her. Client reported she is still living with her daughter. Client reported she is still helping her daughter with her grandkids. Client reported otherwise her  husband has been managing fairly okay. Client reported he got the results back and found out some nodules were cancerous. Client reported she is glad that he is taking care of his health. Evidence of progress towards goal:  client reported 1 positive goal of impulse control to not negatively respond to stressors.  Suicidal/Homicidal: Nowithout intent/plan  Therapist Response:  Therapist began the appointment asking the client how she has been doing. Therapist used CBT to engage using active listening and positive emotional support. Therapist used CBT to engage and give the client time to discuss the source of her negative emotions.  Therapist used CBT to teach the client about emotion regulation. Therapist used CBT ask the client to identify her progress with frequency of use with coping skills with continued practice in her daily activity.    Therapist assigned the client homework to practice self care.   Plan: Return again in 4 weeks.  Diagnosis: bipolar 1 disorder, most recent episode depressed  Collaboration of Care: Patient refused AEB none requested by the client.  Patient/Guardian was advised Release of Information must be obtained prior to any record release in order to collaborate their care with an outside provider. Patient/Guardian was advised if they have not already done so to contact the registration department to sign all necessary forms in order for [place] to release information regarding their care.   Consent: Patient/Guardian gives verbal consent for treatment and assignment of benefits for services provided during this visit. Patient/Guardian expressed understanding and agreed to proceed.   Neena Rhymes Treena Cosman, LCSW 09/22/2023

## 2023-09-24 ENCOUNTER — Encounter: Payer: MEDICAID | Admitting: Internal Medicine

## 2023-10-07 ENCOUNTER — Ambulatory Visit (INDEPENDENT_AMBULATORY_CARE_PROVIDER_SITE_OTHER): Payer: MEDICAID | Admitting: Psychiatry

## 2023-10-07 ENCOUNTER — Encounter (HOSPITAL_COMMUNITY): Payer: Self-pay | Admitting: Psychiatry

## 2023-10-07 DIAGNOSIS — F3130 Bipolar disorder, current episode depressed, mild or moderate severity, unspecified: Secondary | ICD-10-CM

## 2023-10-07 MED ORDER — QUETIAPINE FUMARATE 100 MG PO TABS: 100.0000 mg | ORAL_TABLET | Freq: Every day | ORAL | 3 refills | Status: DC

## 2023-10-07 MED ORDER — LAMOTRIGINE 200 MG PO TABS
200.0000 mg | ORAL_TABLET | Freq: Every day | ORAL | 3 refills | Status: DC
Start: 2023-10-07 — End: 2023-12-31

## 2023-10-07 MED ORDER — HYDROXYZINE PAMOATE 50 MG PO CAPS: 50.0000 mg | ORAL_CAPSULE | Freq: Four times a day (QID) | ORAL | 3 refills | Status: DC | PRN

## 2023-10-07 NOTE — Progress Notes (Signed)
BH MD/PA/NP OP Progress Note         10/07/2023 2:55 PM Cheyenne Fowler  MRN:  [id number]  Chief Complaint:  "I have been more irritable and I took two Lamictal tabs"   HPI: 57 year-old female seen today for follow-up psychiatric evaluation. She has a psychiatric history of polysubstance use (cocaine, alcohol), depression, and bipolar disorder. She was being managed on Lamictal 150 mg daily, Seroquel 100 mg nightly, and Vistaril 50mg  3 times daily as needed. She reports that her medications are somewhat effective in managing her psychiatric conditions.  Today she was  well groomed, pleasant, cooperative, engaged in conversation and maintained eye contact.  Patient reports that she has been more irritable and notes that recently she took 2 Lamictal's.  Patient endorses symptoms of hypomania such as fluctuations in mood, racing thoughts, and impulsiveness. She notes that she impulsively spends money on clothing, scratch off tickets, and wine.  Recently patient notes that an altercation with her daughter's ex-girlfriend.  She notes that she, her daughter, her daughter children, and her husband are living in her daughter's ex home. Recently the ex took out a restraining order on her daughter and her daughter was placed in jail for a while.  She now notes that she and her family has been looking for a new place to live.  She also reports that she is worried about her husband who has been having abdominal pain and issues with his testicle.  She notes that he will be having surgery soon.  To cope patient notes that she used marijuana and alcohol but notes that she has not used the substances in a couple of weeks.    Patient notes that the above worsens her anxiety and depression. Today provider conducted a GAD-7 and patient scored 17, at her last visit she scored a 20.  Provider also conducted PHQ-9 and patient scored a 12, at her last visit she scored a 7.  She endorses adequate sleep and appetite.   Today she denies SI/HI/VAHor paranoia.    Patient informed writer that at times she has back pain which she quantifies as 10 out of 10.  She informed Clinical research associate that she takes over the counter medications for this.    Today Lamictal 150 mg increase to 200 mg to help manage mood. Hydroxyzine 50 mg three times daily as needed increased to 50 mg four times daily as needed. She will continue all other medications as prescribed. She will follow up with outpatient counseling for therapy. No other concerns noted at this time.    Visit Diagnosis:    ICD-10-CM   1. Bipolar I disorder, most recent episode depressed (HCC)  F31.30 hydrOXYzine (VISTARIL) 50 MG capsule    lamoTRIgine (LAMICTAL) 200 MG tablet    QUEtiapine (SEROQUEL) 100 MG tablet        Past Psychiatric History: Depression, substance induced mood disorder, Bipolar disorder,  Past Medical History:  Past Medical History:  Diagnosis Date   Anemia    Bipolar disorder (HCC)    Diabetes mellitus (HCC) 2022   type 2   Environmental and seasonal allergies 1968   GERD (gastroesophageal reflux disease)    Heart murmur    Hypertension    Upper GI bleed 01/15/2022    Past Surgical History:  Procedure Laterality Date   BIOPSY  01/17/2022   Procedure: BIOPSY;  Surgeon: Tressia Danas, MD;  Location: [facility] ENDOSCOPY;  Service: Gastroenterology;;   CESAREAN SECTION  1982, 56, 1990  ESOPHAGOGASTRODUODENOSCOPY (EGD) WITH PROPOFOL N/A 01/17/2022   Procedure: ESOPHAGOGASTRODUODENOSCOPY (EGD) WITH PROPOFOL;  Surgeon: Tressia Danas, MD;  Location: [facility] ENDOSCOPY;  Service: Gastroenterology;  Laterality: N/A;   HERNIA REPAIR     Umbilical hernia in the 2nd grade   LEFT HEART CATH AND CORONARY ANGIOGRAPHY N/A 05/06/2023   Procedure: LEFT HEART CATH AND CORONARY ANGIOGRAPHY;  Surgeon: Tonny Bollman, MD;  Location: [facility] INVASIVE CV LAB;  Service: Cardiovascular;  Laterality: N/A;   TUBAL LIGATION  1990    Family Psychiatric History: Brother  schizophrenia and bipolar, sister bipolar disorder  Family History:  Family History  Problem Relation Age of Onset   Diabetes Mother    Cancer Mother        liver--not clear if this was the primary   COPD Mother    Bipolar disorder Sister    Diabetes Sister    Schizophrenia Brother    Bipolar disorder Brother    Cancer Brother        Not sure of primary   Cancer Maternal Aunt        Breast cancer   Breast cancer Maternal Aunt        Cause of death   Cancer Maternal Aunt        pancreatic   Cancer Maternal Grandfather        throat   Colon cancer Neg Hx    Colon polyps Neg Hx    Esophageal cancer Neg Hx    Stomach cancer Neg Hx    Rectal cancer Neg Hx     Social History:  Social History   Socioeconomic History   Marital status: Married    Spouse name: Evern Bio   Number of children: 3   Years of education: Not on file   Highest education level: GED or equivalent  Occupational History   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  Was drinking every day prior.  Was drinking due to stress.She and husband drink every evening.  Her counselor, Page, at [employer] is aware of her drinking.   Drug use: Not Currently    Types: Marijuana, "Crack" cocaine    Comment: Last cocaine use was 30 days ago.  No MJ for 2 months.   Sexual activity: Yes    Birth control/protection: Surgical  Other Topics Concern   Not on file  Social History Narrative   She and her husband live with her son and her granddaughter.    Born and raised in [place]    Social Determinants of Health   Financial Resource Strain:  Low Risk  (01/23/2022)   Overall Financial Resource Strain (CARDIA)    Difficulty of Paying Living Expenses: Not very hard  Food Insecurity: No Food Insecurity (01/23/2022)   Hunger Vital Sign    Worried About Running Out of Food in the Last Year: Never true    Ran Out of Food in the Last Year: Never true  Transportation Needs: No Transportation Needs (01/23/2022)   PRAPARE - Administrator, Civil Service (Medical): No    Lack of Transportation (Non-Medical):  No  Physical Activity: Not on file  Stress: Not on file  Social Connections: Not on file    Allergies:  Allergies  Allergen Reactions   Penicillins     REACTION: hives   Sulfonamide Derivatives     REACTION: hives    Metabolic Disorder Labs: Lab Results  Component Value Date   HGBA1C 6.0 (H) 03/19/2023   MPG 119.76 07/03/2018   MPG 137 (H) 07/14/2012   No results found for: "PROLACTIN" Lab Results  Component Value Date   CHOL 155 05/20/2023   TRIG 188 (H) 05/20/2023   HDL 51 05/20/2023   CHOLHDL 4.3 07/03/2018   VLDL 31 07/03/2018   LDLCALC 73 05/20/2023   LDLCALC 110 (H) 03/19/2023   Lab Results  Component Value Date   TSH 0.828 05/23/2012    Therapeutic Level Labs: No results found for: "LITHIUM" No results found for: "VALPROATE" No results found for: "CBMZ"  Current Medications: Current Outpatient Medications  Medication Sig Dispense Refill   aspirin EC 81 MG tablet Take 1 tablet (81 mg total) by mouth daily. Swallow whole. 90 tablet 12   atorvastatin (LIPITOR) 80 MG tablet Take 1 tablet (80 mg total) by mouth daily. 30 tablet 11   Blood Glucose Monitoring Suppl DEVI Check blood glucose twice daily before meals (Patient not taking: Reported on 06/03/2023) 1 each 0   Blood Pressure Monitoring (CLEVER CHOICE BP MONITOR/ARM) DEVI Use to check blood pressure 3 times weekly and as needed (Patient not taking: Reported on 06/03/2023) 1 each 0   carvedilol (COREG) 3.125 MG tablet Take 1 tablet (3.125 mg  total) by mouth 2 (two) times daily with a meal. 60 tablet 11   cetirizine (ZYRTEC) 10 MG tablet Take 1 tablet (10 mg total) by mouth daily. 30 tablet 11   Glucose Blood (BLOOD GLUCOSE TEST STRIPS) STRP Check blood glucose twice daily before meals (Patient not taking: Reported on 06/03/2023) 100 strip 11   hydrOXYzine (VISTARIL) 50 MG capsule Take 1 capsule (50 mg total) by mouth every 6 (six) hours as needed. 120 capsule 3   ibuprofen (ADVIL) 800 MG tablet Take 800 mg by mouth as needed for moderate pain.     isosorbide mononitrate (IMDUR) 30 MG 24 hr tablet Take 1 tablet (30 mg total) by mouth daily. 90 tablet 3   lamoTRIgine (LAMICTAL) 200 MG tablet Take 1 tablet (200 mg total) by mouth daily. 30 tablet 3   Lancet Device MISC Check blood glucose twice daily before meals (Patient not taking: Reported on 06/03/2023) 1 each 0   Lancets Misc. MISC Check blood glucose twice daily before meals (Patient not taking: Reported on 06/03/2023) 100 each 11   losartan (COZAAR) 50 MG tablet Take 1 tablet (50 mg total) by mouth daily. 30 tablet 11   metFORMIN (GLUCOPHAGE-XR) 500 MG 24 hr tablet TAKE 1 TABLET BY MOUTH TWICE DAILY WITH MEALS 60 tablet 11   nitroGLYCERIN (NITROSTAT) 0.4 MG SL tablet Place 1 tablet (0.4 mg total) under the tongue every 5 (five) minutes as needed for chest pain. (Patient not taking: Reported on 06/03/2023) 25 tablet 1   pantoprazole (PROTONIX) 40 MG tablet Take 1 tablet by mouth twice daily 180 tablet 0   QUEtiapine (SEROQUEL) 100 MG tablet Take 1 tablet (100 mg total) by mouth at bedtime. 30 tablet 3   No current facility-administered medications for this visit.     Musculoskeletal: Strength & Muscle Tone: within normal limits Gait & Station: normal Patient leans: N/A  Psychiatric Specialty Exam: Review of Systems  Last menstrual period 06/09/2018.There is no height or weight on file to calculate BMI.  General Appearance: Well Groomed  Eye Contact:  Good  Speech:  Clear and  Coherent and Normal Rate  Volume:  Normal  Mood:  Anxious and Depressed  Affect:  Congruent  Thought Process:  Coherent, Goal Directed, and Linear  Orientation:  Full (Time, Place, and Person)  Thought Content: WDL and Logical   Suicidal Thoughts:  No  Homicidal Thoughts:  No  Memory:  Immediate;   Good Recent;   Good Remote;   Good  Judgement:  Good  Insight:  Good  Psychomotor Activity:  Normal  Concentration:  Concentration: Good and Attention Span: Good  Recall:  Good  Fund of Knowledge: Good  Language: Good  Akathisia:  No  Handed:  Right  AIMS (if indicated):  done, 0  Assets:  Communication Skills Desire for Improvement Housing Leisure Time Social Support  ADL's:  Intact  Cognition: WNL  Sleep:  Good   Screenings: GAD-7    Flowsheet Row Clinical Support from 10/07/2023 in [facility] Clinical Support from 07/08/2023 in [facility] Video Visit from 01/15/2023 in [facility] Video Visit from 02/06/2022 in [facility] Clinical Support from 11/12/2021 in [facility]  Total GAD-7 Score 17 20 20 16 21       PHQ2-9    Flowsheet Row Clinical Support from 10/07/2023 in [facility] Clinical Support from 07/08/2023 in [facility] Video Visit from 01/15/2023 in [facility] Video Visit from 02/06/2022 in [facility] Clinical Support from 11/12/2021 in [facility]  PHQ-2 Total Score 3 2 5 5 6   PHQ-9 Total Score 12 7 24 7 24       Flowsheet Row Admission (Discharged) from 05/06/2023 in [facility] CARDIAC CATH LAB ED from 02/23/2023 in [facility]) Video Visit from 01/15/2023 in [facility]  C-SSRS RISK CATEGORY No  Risk No Risk Error: Q7 should not be populated when Q6 is No        Assessment and Plan: Patient endorses increased anxiety, depression, and symptoms of hypomania.  She informed Clinical research associate that she could 2 mg pills to help manage her mood recently.  Today Lamictal 150 mg increase to 200 mg to help manage mood. Hydroxyzine 50 mg three times daily as needed increased to 50 mg four times daily as needed. She will continue all other medications as prescribed.   1. Bipolar I disorder, most recent episode depressed (HCC)  Increased- hydrOXYzine (VISTARIL) 50 MG capsule; Take 1 capsule (50 mg total) by mouth every 6 (six) hours as needed.  Dispense: 120 capsule; Refill: 3 Increased- lamoTRIgine (LAMICTAL) 200 MG tablet; Take 1 tablet (200 mg total) by mouth daily.  Dispense: 30 tablet; Refill: 3 Continue- QUEtiapine (SEROQUEL) 100 MG tablet; Take 1 tablet (100 mg total) by mouth at bedtime.  Dispense: 30 tablet; Refill: 3   Follow-up in 3 months Shanna Cisco, NP 10/07/2023, 2:55 PM

## 2023-10-13 ENCOUNTER — Ambulatory Visit (INDEPENDENT_AMBULATORY_CARE_PROVIDER_SITE_OTHER): Payer: MEDICAID | Admitting: Clinical

## 2023-10-13 DIAGNOSIS — F3130 Bipolar disorder, current episode depressed, mild or moderate severity, unspecified: Secondary | ICD-10-CM

## 2023-10-13 NOTE — Progress Notes (Signed)
   THERAPIST PROGRESS NOTE  Session Time: 30 minutes  Participation Level: Active  Behavioral Response: CasualAlertEuthymic  Type of Therapy: Individual Therapy  Treatment Goals addressed: Client will complete at least 80% of assigned homework  ProgressTowards Goals: Progressing  Interventions: CBT and Supportive  Summary:  Cheyenne Fowler is a 57 y.o. female who presents for the scheduled appointment oriented x 5, appropriately dressed, and friendly.  Client denied hallucinations and delusions. Client reported on today she has been doing fairly okay. Client reported she has been running behind everybody else and feeling like she is at the point of needing a break. Client reported her husband had the surgery to remove his mass. Client reported he is acting like a baby and dependent on her for helping him to do things he should be doing himself. Client reported she also is still living with her daughter. Client reported they will have to move out of that house. Client reported she also is helping with her grandchildren. Client reported otherwise she has been doing okay and attending her medical appointments. Client reported she does need to see her PCP about some back pain she is having. Evidence of progress towards goal:  client reported she can tell the signs of burnout in at least 4 days our of the week from her involvement in helping her family do their things.    Suicidal/Homicidal: Nowithout intent/plan  Therapist Response:  Therapist began the appointment asking the client how she has been doing. Therapist used cbt to engage using active listening and positive emotional support. Therapist used cbt to engage and give the client time to discuss family stressors and how she has coped with it. Therapist used cbt to engage normalize her emotions within reason. Therapist used CBT ask the client to identify her progress with frequency of use with coping skills with continued practice  in her daily activity.       Plan: Return again in 3 weeks.  Diagnosis: bipolar 1 disorder, most recent episode depressed  Collaboration of Care: Patient refused AEB none requested by the client.  Patient/Guardian was advised Release of Information must be obtained prior to any record release in order to collaborate their care with an outside provider. Patient/Guardian was advised if they have not already done so to contact the registration department to sign all necessary forms in order for [place] to release information regarding their care.   Consent: Patient/Guardian gives verbal consent for treatment and assignment of benefits for services provided during this visit. Patient/Guardian expressed understanding and agreed to proceed.   Neena Rhymes Rashan Rounsaville, LCSW 10/13/2023

## 2023-11-03 ENCOUNTER — Ambulatory Visit (HOSPITAL_COMMUNITY): Payer: MEDICAID | Admitting: Clinical

## 2023-11-12 NOTE — Telephone Encounter (Signed)
Error

## 2023-11-14 NOTE — Telephone Encounter (Signed)
Error

## 2023-11-27 ENCOUNTER — Emergency Department (HOSPITAL_COMMUNITY): Payer: MEDICAID

## 2023-11-27 ENCOUNTER — Other Ambulatory Visit: Payer: Self-pay

## 2023-11-27 ENCOUNTER — Emergency Department (HOSPITAL_COMMUNITY)
Admission: EM | Admit: 2023-11-27 | Discharge: 2023-11-27 | Discharge status: Against Medical Advice | Payer: MEDICAID | Attending: Emergency Medicine | Admitting: Emergency Medicine

## 2023-11-27 DIAGNOSIS — Z5321 Procedure and treatment not carried out due to patient leaving prior to being seen by health care provider: Secondary | ICD-10-CM | POA: Insufficient documentation

## 2023-11-27 DIAGNOSIS — M25511 Pain in right shoulder: Secondary | ICD-10-CM | POA: Insufficient documentation

## 2023-11-27 LAB — CBC
HCT: 43.6 % (ref 36.0–46.0)
Hemoglobin: 13.9 g/dL (ref 12.0–15.0)
MCH: 28.4 pg (ref 26.0–34.0)
MCHC: 31.9 g/dL (ref 30.0–36.0)
MCV: 89.2 fL (ref 80.0–100.0)
Platelets: 293 10*3/uL (ref 150–400)
RBC: 4.89 MIL/uL (ref 3.87–5.11)
RDW: 12.8 % (ref 11.5–15.5)
WBC: 8.4 10*3/uL (ref 4.0–10.5)
nRBC: 0 % (ref 0.0–0.2)

## 2023-11-27 LAB — BASIC METABOLIC PANEL
Anion gap: 10 (ref 5–15)
BUN: 15 mg/dL (ref 6–20)
CO2: 22 mmol/L (ref 22–32)
Calcium: 9.6 mg/dL (ref 8.9–10.3)
Chloride: 107 mmol/L (ref 98–111)
Creatinine, Ser: 0.86 mg/dL (ref 0.44–1.00)
GFR, Estimated: >60 mL/min (ref >60)
Glucose, Bld: 115 mg/dL — ABNORMAL HIGH (ref 70–99)
Potassium: 4 mmol/L (ref 3.5–5.1)
Sodium: 139 mmol/L (ref 135–145)

## 2023-11-27 LAB — TROPONIN I (HIGH SENSITIVITY): Troponin I (High Sensitivity): 3 ng/L (ref <18)

## 2023-11-27 MED ORDER — OXYCODONE HCL 5 MG PO TABS: 5.0000 mg | ORAL_TABLET | Freq: Once | ORAL | Status: DC

## 2023-11-27 NOTE — ED Triage Notes (Signed)
Pt. Stated, Im having left arm numbness and left arm pain. I walked in ok. Arm drift to left side, negative all other areas.This started this morning. P stating how bad it hurts

## 2023-11-27 NOTE — ED Provider Triage Note (Signed)
Emergency Medicine Provider Triage Evaluation Note  Cheyenne Fowler , a 57 y.o. female  was evaluated in triage.  Pt complains of right shoulder pain which began acutely this morning.  Patient is in acute distress in triage, screaming and pain.  She states she feels like her right arm is numb.  She is tearful and inconsolable.  She will not let me examine her.  Radial pulses and sensation intact.  Right upper extremity warm, cap refill <2.  She denies any falls or injuries.  No chest pain.  Review of Systems  Positive: As above Negative: As above  Physical Exam  BP (!) 147/96 (BP Location: Left Arm)   Pulse 79   Temp 97.8 F (36.6 C) (Oral)   Resp 20   Ht 5\' 2"  (1.575 m)   Wt 113.4 kg   LMP 06/09/2018 (Exact Date) Comment: Bleeding one month ago.  SpO2 100%   BMI 45.73 kg/m  Gen:   Awake, extremely tearful, crying Resp:  Normal effort  MSK:   Holding onto right arm and rocking back and forth, unable to examine secondary to pain.  Radial pulses and sensations intact, cap refill Other:    Medical Decision Making  Medically screening exam initiated at 10:50 AM.  Appropriate orders placed.  Cheyenne Fowler was informed that the remainder of the evaluation will be completed by another provider, this initial triage assessment does not replace that evaluation, and the importance of remaining in the ED until their evaluation is complete.  Patient is acutely distressed in triage.  Will order right shoulder x-ray, basic labs, troponin and EKG.  Will provide oxycodone for pain relief.   Mare Ferrari, PA-C 11/27/23 [id number]

## 2023-11-27 NOTE — ED Notes (Signed)
 Pt seen leaving ED with visitor.

## 2023-11-27 NOTE — ED Notes (Signed)
Pt visitor asked this NT if we are able to share xray results with different providers. This NT stated that we cannot share results with different providers due to EMTALA violations. But anything interaction between the pt and the hospital will result in pt's MyChart. Visitor also asked if there is a process of leaving the ED by checking out, this NT advised visitor to let this NT know if pt was leaving or not. Visitor acknowledges.

## 2023-11-28 ENCOUNTER — Inpatient Hospital Stay (HOSPITAL_COMMUNITY)
Admission: EM | Admit: 2023-11-28 | Discharge: 2023-12-03 | DRG: 098 | Disposition: A | Discharge status: Home / Self Care | Payer: MEDICAID | Attending: Internal Medicine | Admitting: Internal Medicine

## 2023-11-28 ENCOUNTER — Emergency Department (HOSPITAL_COMMUNITY): Payer: MEDICAID

## 2023-11-28 ENCOUNTER — Encounter (HOSPITAL_COMMUNITY): Payer: Self-pay

## 2023-11-28 ENCOUNTER — Other Ambulatory Visit: Payer: Self-pay

## 2023-11-28 DIAGNOSIS — Z882 Allergy status to sulfonamides status: Secondary | ICD-10-CM

## 2023-11-28 DIAGNOSIS — Z818 Family history of other mental and behavioral disorders: Secondary | ICD-10-CM

## 2023-11-28 DIAGNOSIS — F1090 Alcohol use, unspecified, uncomplicated: Secondary | ICD-10-CM | POA: Diagnosis present

## 2023-11-28 DIAGNOSIS — E119 Type 2 diabetes mellitus without complications: Secondary | ICD-10-CM

## 2023-11-28 DIAGNOSIS — Z6841 Body Mass Index (BMI) 40.0 and over, adult: Secondary | ICD-10-CM

## 2023-11-28 DIAGNOSIS — Z880 Allergy status to penicillin: Secondary | ICD-10-CM

## 2023-11-28 DIAGNOSIS — F3130 Bipolar disorder, current episode depressed, mild or moderate severity, unspecified: Secondary | ICD-10-CM | POA: Diagnosis present

## 2023-11-28 DIAGNOSIS — Z79899 Other long term (current) drug therapy: Secondary | ICD-10-CM

## 2023-11-28 DIAGNOSIS — Z7982 Long term (current) use of aspirin: Secondary | ICD-10-CM

## 2023-11-28 DIAGNOSIS — E785 Hyperlipidemia, unspecified: Secondary | ICD-10-CM | POA: Diagnosis present

## 2023-11-28 DIAGNOSIS — Z7141 Alcohol abuse counseling and surveillance of alcoholic: Secondary | ICD-10-CM

## 2023-11-28 DIAGNOSIS — F1410 Cocaine abuse, uncomplicated: Secondary | ICD-10-CM | POA: Diagnosis present

## 2023-11-28 DIAGNOSIS — E66813 Obesity, class 3: Secondary | ICD-10-CM | POA: Diagnosis present

## 2023-11-28 DIAGNOSIS — F1010 Alcohol abuse, uncomplicated: Secondary | ICD-10-CM | POA: Diagnosis present

## 2023-11-28 DIAGNOSIS — Z7984 Long term (current) use of oral hypoglycemic drugs: Secondary | ICD-10-CM

## 2023-11-28 DIAGNOSIS — Z833 Family history of diabetes mellitus: Secondary | ICD-10-CM

## 2023-11-28 DIAGNOSIS — K219 Gastro-esophageal reflux disease without esophagitis: Secondary | ICD-10-CM | POA: Diagnosis present

## 2023-11-28 DIAGNOSIS — I10 Essential (primary) hypertension: Secondary | ICD-10-CM | POA: Diagnosis present

## 2023-11-28 DIAGNOSIS — Z800 Family history of malignant neoplasm of digestive organs: Secondary | ICD-10-CM

## 2023-11-28 DIAGNOSIS — M4804 Spinal stenosis, thoracic region: Secondary | ICD-10-CM | POA: Diagnosis present

## 2023-11-28 DIAGNOSIS — Z803 Family history of malignant neoplasm of breast: Secondary | ICD-10-CM

## 2023-11-28 DIAGNOSIS — Z825 Family history of asthma and other chronic lower respiratory diseases: Secondary | ICD-10-CM

## 2023-11-28 DIAGNOSIS — F17210 Nicotine dependence, cigarettes, uncomplicated: Secondary | ICD-10-CM | POA: Diagnosis present

## 2023-11-28 DIAGNOSIS — M4802 Spinal stenosis, cervical region: Secondary | ICD-10-CM | POA: Diagnosis present

## 2023-11-28 DIAGNOSIS — G373 Acute transverse myelitis in demyelinating disease of central nervous system: Principal | ICD-10-CM | POA: Diagnosis present

## 2023-11-28 DIAGNOSIS — Z789 Other specified health status: Secondary | ICD-10-CM | POA: Diagnosis present

## 2023-11-28 LAB — COMPREHENSIVE METABOLIC PANEL
ALT: 31 U/L (ref 0–44)
AST: 23 U/L (ref 15–41)
Albumin: 4.2 g/dL (ref 3.5–5.0)
Alkaline Phosphatase: 89 U/L (ref 38–126)
Anion gap: 7 (ref 5–15)
BUN: 13 mg/dL (ref 6–20)
CO2: 28 mmol/L (ref 22–32)
Calcium: 9.4 mg/dL (ref 8.9–10.3)
Chloride: 104 mmol/L (ref 98–111)
Creatinine, Ser: 0.76 mg/dL (ref 0.44–1.00)
GFR, Estimated: >60 mL/min (ref >60)
Glucose, Bld: 124 mg/dL — ABNORMAL HIGH (ref 70–99)
Potassium: 4.1 mmol/L (ref 3.5–5.1)
Sodium: 139 mmol/L (ref 135–145)
Total Bilirubin: 0.4 mg/dL (ref <1.2)
Total Protein: 7.6 g/dL (ref 6.5–8.1)

## 2023-11-28 LAB — URINALYSIS, ROUTINE W REFLEX MICROSCOPIC
Bilirubin Urine: NEGATIVE
Glucose, UA: NEGATIVE mg/dL
Hgb urine dipstick: NEGATIVE
Ketones, ur: NEGATIVE mg/dL
Leukocytes,Ua: NEGATIVE
Nitrite: POSITIVE — AB
Protein, ur: NEGATIVE mg/dL
Specific Gravity, Urine: 1.014 (ref 1.005–1.030)
pH: 6 (ref 5.0–8.0)

## 2023-11-28 LAB — CBC
HCT: 45.7 % (ref 36.0–46.0)
Hemoglobin: 14.5 g/dL (ref 12.0–15.0)
MCH: 29 pg (ref 26.0–34.0)
MCHC: 31.7 g/dL (ref 30.0–36.0)
MCV: 91.4 fL (ref 80.0–100.0)
Platelets: 291 10*3/uL (ref 150–400)
RBC: 5 MIL/uL (ref 3.87–5.11)
RDW: 13.2 % (ref 11.5–15.5)
WBC: 7.1 10*3/uL (ref 4.0–10.5)
nRBC: 0 % (ref 0.0–0.2)

## 2023-11-28 LAB — DIFFERENTIAL
Abs Immature Granulocytes: 0.02 10*3/uL (ref 0.00–0.07)
Basophils Absolute: 0 10*3/uL (ref 0.0–0.1)
Basophils Relative: 0 %
Eosinophils Absolute: 0.2 10*3/uL (ref 0.0–0.5)
Eosinophils Relative: 3 %
Immature Granulocytes: 0 %
Lymphocytes Relative: 45 %
Lymphs Abs: 3.2 10*3/uL (ref 0.7–4.0)
Monocytes Absolute: 0.6 10*3/uL (ref 0.1–1.0)
Monocytes Relative: 9 %
Neutro Abs: 3.1 10*3/uL (ref 1.7–7.7)
Neutrophils Relative %: 43 %

## 2023-11-28 LAB — SEDIMENTATION RATE: Sed Rate: 4 mm/h (ref 0–22)

## 2023-11-28 LAB — APTT: aPTT: 27 s (ref 24–36)

## 2023-11-28 LAB — CBG MONITORING, ED: Glucose-Capillary: 122 mg/dL — ABNORMAL HIGH (ref 70–99)

## 2023-11-28 LAB — RAPID URINE DRUG SCREEN, HOSP PERFORMED
Amphetamines: NOT DETECTED
Barbiturates: NOT DETECTED
Benzodiazepines: NOT DETECTED
Cocaine: POSITIVE — AB
Opiates: POSITIVE — AB
Tetrahydrocannabinol: NOT DETECTED

## 2023-11-28 LAB — PROTIME-INR
INR: 0.9 (ref 0.8–1.2)
Prothrombin Time: 12.6 s (ref 11.4–15.2)

## 2023-11-28 LAB — CK: Total CK: 156 U/L (ref 38–234)

## 2023-11-28 LAB — C-REACTIVE PROTEIN: CRP: 0.7 mg/dL (ref <1.0)

## 2023-11-28 MED ORDER — MORPHINE SULFATE (PF) 2 MG/ML IV SOLN
2.0000 mg | INTRAVENOUS | Status: DC | PRN
  Administered 2023-11-29: 2 mg via INTRAVENOUS
  Filled 2023-11-28: qty 1

## 2023-11-28 MED ORDER — LORAZEPAM 2 MG/ML IJ SOLN
1.0000 mg | Freq: Once | INTRAMUSCULAR | Status: AC
  Administered 2023-11-28: 1 mg via INTRAVENOUS
  Filled 2023-11-28: qty 1

## 2023-11-28 MED ORDER — LIDOCAINE-EPINEPHRINE (PF) 2 %-1:200000 IJ SOLN
20.0000 mL | Freq: Once | INTRAMUSCULAR | Status: AC
  Administered 2023-11-28: 20 mL
  Filled 2023-11-28: qty 20

## 2023-11-28 MED ORDER — GADOBUTROL 1 MMOL/ML IV SOLN
10.0000 mL | Freq: Once | INTRAVENOUS | Status: AC | PRN
  Administered 2023-11-28: 10 mL via INTRAVENOUS

## 2023-11-28 NOTE — ED Notes (Signed)
CSF fluid walked down to lab

## 2023-11-28 NOTE — ED Notes (Signed)
Patient medicated and MRI notified patient is ready

## 2023-11-28 NOTE — ED Provider Notes (Signed)
Signout from Dr. Rhunette Croft.  57 year old female with right arm numbness weakness pain that is been going on for 2 days.  MRI brain without acute findings.  She is pending MRI of cervical spine and brachial plexus.  If no significant findings will need outpatient follow-up with neurology in 2 to 4 weeks for nerve conduction studies. Physical Exam  BP 138/65   Pulse (!) 59   Temp 97.7 F (36.5 C) (Oral)   Resp 10   Ht 5\' 2"  (1.575 m)   Wt 113.4 kg   LMP 06/09/2018 (Exact Date) Comment: Bleeding one month ago.  SpO2 100%   BMI 45.73 kg/m   Physical Exam  Procedures  Lumbar Puncture  Date/Time: 11/28/2023 11:05 PM  Performed by: Terrilee Files, MD Authorized by: Terrilee Files, MD   Consent:    Consent obtained:  Written   Consent given by:  Patient   Risks, benefits, and alternatives were discussed: yes     Risks discussed:  Bleeding, headache, nerve damage, infection, pain and repeat procedure   Alternatives discussed:  Delayed treatment and alternative treatment Universal protocol:    Procedure explained and questions answered to patient or proxy's satisfaction: yes     Patient identity confirmed:  Verbally with patient Pre-procedure details:    Procedure purpose:  Diagnostic   Preparation: Patient was prepped and draped in usual sterile fashion   Anesthesia:    Anesthesia method:  Local infiltration   Local anesthetic:  Lidocaine 2% WITH epi Procedure details:    Lumbar space:  L4-L5 interspace   Patient position:  Sitting   Ultrasound guidance: no     Number of attempts:  3   Fluid appearance:  Blood-tinged then clearing   Tubes of fluid:  4   Total volume (ml):  8 Post-procedure details:    Puncture site:  Adhesive bandage applied   Procedure completion:  Tolerated well, no immediate complications   ED Course / MDM    Medical Decision Making Amount and/or Complexity of Data Reviewed Labs: ordered. Radiology: ordered.  Risk Prescription drug  management. Decision regarding hospitalization.   Patient CT showing some abnormal signal in cervical spine.  Reviewed this with neurology Dr. Derry Lory.  He is recommending admission to [facility] campus for further neurology workup.  Discussed with [facility] hospitalist Dr. Julian Reil  [phone] - Neurology Dr. Ardine Eng is recommending an LP.  I reviewed this with the patient and she is agreeable.      Terrilee Files, MD 11/29/23 1134

## 2023-11-28 NOTE — ED Triage Notes (Addendum)
Patient reports dizziness and right arm pain, numbness, and weakness since yesterday. Patient seen at [facility] yesterday, but left.   A&Ox4, no facial droop, no other weakness noted, PERRLA.

## 2023-11-28 NOTE — ED Provider Notes (Signed)
[place] EMERGENCY DEPARTMENT AT [facility] Provider Note   CSN: [id number] Arrival date & time: 11/28/23  4098     History {Add pertinent medical, surgical, social history, OB history to HPI:1} Chief Complaint  Patient presents with   Extremity Weakness    Cheyenne Fowler is a 57 y.o. female.  HPI     57 year old female comes in with chief complaint of right upper extremity weakness and pain.  Patient states that she woke up yesterday morning and was doing well.  She took a nap, woke up again at 8:30 AM and noted that she was not who her right upper extremity and she had some pain and tingling on that side.  She went to [facility], ER -but left because the waiting room was very busy after initial medical screening evaluation.  She then went to urgent care, had cervical spine x-ray and they recommended returning to the ER.  Patient has history of hypertension, hyperlipidemia, diabetes and admits to tobacco use disorder, intermittent cocaine use and regular alcohol use.  She has no history of CAD, stroke.  Patient denies any difficulty with speech, confusion, difficulty in swallowing.  Home Medications Prior to Admission medications   Medication Sig Start Date End Date Taking? Authorizing Provider  atorvastatin (LIPITOR) 80 MG tablet Take 1 tablet (80 mg total) by mouth daily. 03/19/23  Yes Julieanne Manson, MD  carvedilol (COREG) 3.125 MG tablet Take 1 tablet (3.125 mg total) by mouth 2 (two) times daily with a meal. Patient taking differently: Take 3.125 mg by mouth daily. 03/19/23  Yes Julieanne Manson, MD  cetirizine (ZYRTEC) 10 MG tablet Take 1 tablet (10 mg total) by mouth daily. 03/19/23  Yes Julieanne Manson, MD  hydrOXYzine (VISTARIL) 50 MG capsule Take 1 capsule (50 mg total) by mouth every 6 (six) hours as needed. Patient taking differently: Take 50 mg by mouth daily. 10/07/23  Yes Toy Cookey E, NP  isosorbide mononitrate (IMDUR) 30 MG 24 hr  tablet Take 1 tablet (30 mg total) by mouth daily. 04/30/23  Yes Nahser, Deloris Ping, MD  lamoTRIgine (LAMICTAL) 200 MG tablet Take 1 tablet (200 mg total) by mouth daily. 10/07/23  Yes Toy Cookey E, NP  losartan (COZAAR) 50 MG tablet Take 1 tablet (50 mg total) by mouth daily. 01/30/23  Yes Julieanne Manson, MD  metFORMIN (GLUCOPHAGE-XR) 500 MG 24 hr tablet TAKE 1 TABLET BY MOUTH TWICE DAILY WITH MEALS 03/19/23  Yes Julieanne Manson, MD  nitroGLYCERIN (NITROSTAT) 0.4 MG SL tablet Place 1 tablet (0.4 mg total) under the tongue every 5 (five) minutes as needed for chest pain. 03/19/23  Yes Julieanne Manson, MD  pantoprazole (PROTONIX) 40 MG tablet Take 1 tablet by mouth twice daily Patient taking differently: Take 40 mg by mouth daily. 03/10/23  Yes Tressia Danas, MD  QUEtiapine (SEROQUEL) 100 MG tablet Take 1 tablet (100 mg total) by mouth at bedtime. 10/07/23  Yes Toy Cookey E, NP  aspirin EC 81 MG tablet Take 1 tablet (81 mg total) by mouth daily. Swallow whole. Patient not taking: Reported on 11/28/2023 04/30/23   Nahser, Deloris Ping, MD  Glucose Blood (BLOOD GLUCOSE TEST STRIPS) STRP Check blood glucose twice daily before meals Patient not taking: Reported on 06/03/2023 05/21/23   Julieanne Manson, MD  Lancets Misc. MISC Check blood glucose twice daily before meals Patient not taking: Reported on 06/03/2023 05/21/23   Julieanne Manson, MD      Allergies    Penicillins and Sulfonamide derivatives  Review of Systems   Review of Systems  All other systems reviewed and are negative.   Physical Exam Updated Vital Signs BP 138/65   Pulse (!) 59   Temp 97.7 F (36.5 C) (Oral)   Resp 10   Ht 5\' 2"  (1.575 m)   Wt 113.4 kg   LMP 06/09/2018 (Exact Date) Comment: Bleeding one month ago.  SpO2 100%   BMI 45.73 kg/m  Physical Exam Vitals and nursing note reviewed.  Constitutional:      Appearance: She is well-developed.  HENT:     Head: Atraumatic.  Eyes:      Extraocular Movements: Extraocular movements intact.     Pupils: Pupils are equal, round, and reactive to light.  Cardiovascular:     Rate and Rhythm: Normal rate.  Pulmonary:     Effort: Pulmonary effort is normal.  Musculoskeletal:     Cervical back: Tenderness present.     Comments: Patient has tenderness over the cervical spine and right shoulder  Skin:    General: Skin is warm and dry.  Neurological:     Mental Status: She is alert and oriented to person, place, and time.     Sensory: Sensory deficit present.     Motor: Weakness present.     Comments: Patient has subjective numbness to the entire right upper extremity.  She has 4 out of 5 right lower extremity strength, 4+ out of 5 right lower extremity strength. She has 2/5 strength in the right lower extremity.  No dysarthria, no neglect, no aphasia     ED Results / Procedures / Treatments   Labs (all labs ordered are listed, but only abnormal results are displayed) Labs Reviewed  COMPREHENSIVE METABOLIC PANEL - Abnormal; Notable for the following components:      Result Value   Glucose, Bld 124 (*)    All other components within normal limits  RAPID URINE DRUG SCREEN, HOSP PERFORMED - Abnormal; Notable for the following components:   Opiates POSITIVE (*)    Cocaine POSITIVE (*)    All other components within normal limits  URINALYSIS, ROUTINE W REFLEX MICROSCOPIC - Abnormal; Notable for the following components:   APPearance HAZY (*)    Nitrite POSITIVE (*)    Bacteria, UA MANY (*)    All other components within normal limits  CBG MONITORING, ED - Abnormal; Notable for the following components:   Glucose-Capillary 122 (*)    All other components within normal limits  PROTIME-INR  APTT  CBC  DIFFERENTIAL    EKG EKG Interpretation Date/Time:  Friday November 28 2023 10:01:15 EST Ventricular Rate:  69 PR Interval:  172 QRS Duration:  86 QT Interval:  420 QTC Calculation: 450 R Axis:   -6  Text  Interpretation: Sinus rhythm No acute changes No significant change since last tracing Confirmed by Derwood Kaplan [phone]) on 11/28/2023 11:07:54 AM  Radiology MR BRAIN WO CONTRAST  Result Date: 11/28/2023 CLINICAL DATA:  Neuro deficit, acute, stroke suspected head CT January 10, 2019. EXAM: MRI HEAD WITHOUT CONTRAST TECHNIQUE: Multiplanar, multiecho pulse sequences of the brain and surrounding structures were obtained without intravenous contrast. COMPARISON:  None Available. FINDINGS: Brain: No acute infarction, hemorrhage, hydrocephalus, extra-axial collection or mass lesion. Vascular: Normal flow voids. Incidentally noted hypoplastic vertebrobasilar system. Skull and upper cervical spine: Diffuse decrease of the T1 signal the visualized upper cervical spine and calvarium, may be related to red marrow reconversion. Sinuses/Orbits: Bilateral proptosis.  Paranasal sinuses are clear. Other: None. IMPRESSION: 1.  No acute intracranial abnormality. 2. Diffuse decrease of the T1 signal the visualized upper cervical spine and calvarium, may be related to red marrow reconversion. Electronically Signed   By: Baldemar Lenis M.D.   On: 11/28/2023 12:09   DG Shoulder Right  Result Date: 11/27/2023 CLINICAL DATA:  Right shoulder pain and numbness. EXAM: RIGHT SHOULDER - 2+ VIEW COMPARISON:  None Available. FINDINGS: No acute fracture or dislocation. No aggressive osseous lesion. Glenohumeral and acromioclavicular joints are normal in alignment and exhibit mild-to-moderate degenerative changes. No soft tissue swelling. No radiopaque foreign bodies. IMPRESSION: *No acute osseous abnormality of the right shoulder joint. Mild-to-moderate degenerative joint disease. Electronically Signed   By: Jules Schick M.D.   On: 11/27/2023 12:13    Procedures Procedures  {Document cardiac monitor, telemetry assessment procedure when appropriate:1}  Medications Ordered in ED Medications  LORazepam (ATIVAN)  injection 1 mg (1 mg Intravenous Given 11/28/23 1104)    ED Course/ Medical Decision Making/ A&P   {   Click here for ABCD2, HEART and other calculatorsREFRESH Note before signing :1}                              Medical Decision Making Amount and/or Complexity of Data Reviewed Labs: ordered. Radiology: ordered.  Risk Prescription drug management.  This patient presents to the ED with chief complaint(s) of right-sided weakness and pain, numbness with pertinent past medical history of metabolic syndrome, tobacco and cocaine use disorder.The complaint involves an extensive differential diagnosis and also carries with it a high risk of complications and morbidity.    The differential diagnosis includes: Stroke - ischemic vs. hemorrhagic Neuropathy Myelitis Electrolyte abnormality Neuropathy Muscular disease Impingement syndrome Brachial plexopathy  Concerns for stroke. Patient is outside of TNK window.  She screens negative for LVO.  The initial plan is to get basic labs, CT scan of the brain and MRI of the brain.   Additional history obtained: Additional history obtained from family Records reviewed  previous imaging including x-ray of the shoulder that was completed yesterday.  Independent labs interpretation:  The following labs were independently interpreted: Lab results are overall normal and reassuring.  Independent visualization and interpretation of imaging: - I independently visualized the following imaging with scope of interpretation limited to determining acute life threatening conditions related to emergency care: MRI brain, which is negative for any bleed.  No evidence of stroke.  Treatment and Reassessment: Patient reassessed.  Plan is to get MRI of the cervical spine. If the MRI is negative, patient can get outpatient follow-up Neurology.   Consultation: - Consulted or discussed management/test interpretation with external professional: Case discussed with  neurology team.  Dr. Ruthe Mannan recommends that he get MRI C-spine with and without contrast with brachial plexus protocol.  Additionally to add some inflammatory markers and CK.  If the workup in the ER is negative, then patient will need follow-up with neurology as an outpatient in 2 to 4 weeks for nerve conduction studies.  This recommendation has been discussed with the patient.  Final Clinical Impression(s) / ED Diagnoses Final diagnoses:  None    Rx / DC Orders ED Discharge Orders     None

## 2023-11-28 NOTE — ED Notes (Signed)
Patient resting in bed with family at bedside.

## 2023-11-28 NOTE — ED Notes (Signed)
Patient resting in bed with family at bedside waiting for MRI

## 2023-11-29 ENCOUNTER — Inpatient Hospital Stay (HOSPITAL_COMMUNITY): Payer: MEDICAID

## 2023-11-29 DIAGNOSIS — I10 Essential (primary) hypertension: Secondary | ICD-10-CM | POA: Diagnosis present

## 2023-11-29 DIAGNOSIS — G373 Acute transverse myelitis in demyelinating disease of central nervous system: Principal | ICD-10-CM

## 2023-11-29 DIAGNOSIS — E785 Hyperlipidemia, unspecified: Secondary | ICD-10-CM | POA: Diagnosis present

## 2023-11-29 DIAGNOSIS — Z7982 Long term (current) use of aspirin: Secondary | ICD-10-CM | POA: Diagnosis not present

## 2023-11-29 DIAGNOSIS — Z803 Family history of malignant neoplasm of breast: Secondary | ICD-10-CM | POA: Diagnosis not present

## 2023-11-29 DIAGNOSIS — E119 Type 2 diabetes mellitus without complications: Secondary | ICD-10-CM | POA: Diagnosis present

## 2023-11-29 DIAGNOSIS — Z79899 Other long term (current) drug therapy: Secondary | ICD-10-CM | POA: Diagnosis not present

## 2023-11-29 DIAGNOSIS — F1410 Cocaine abuse, uncomplicated: Secondary | ICD-10-CM

## 2023-11-29 DIAGNOSIS — Z7984 Long term (current) use of oral hypoglycemic drugs: Secondary | ICD-10-CM | POA: Diagnosis not present

## 2023-11-29 DIAGNOSIS — F17210 Nicotine dependence, cigarettes, uncomplicated: Secondary | ICD-10-CM | POA: Diagnosis present

## 2023-11-29 DIAGNOSIS — M4804 Spinal stenosis, thoracic region: Secondary | ICD-10-CM | POA: Diagnosis present

## 2023-11-29 DIAGNOSIS — M25511 Pain in right shoulder: Secondary | ICD-10-CM | POA: Diagnosis present

## 2023-11-29 DIAGNOSIS — Z880 Allergy status to penicillin: Secondary | ICD-10-CM | POA: Diagnosis not present

## 2023-11-29 DIAGNOSIS — F1010 Alcohol abuse, uncomplicated: Secondary | ICD-10-CM | POA: Diagnosis present

## 2023-11-29 DIAGNOSIS — M4802 Spinal stenosis, cervical region: Secondary | ICD-10-CM | POA: Diagnosis present

## 2023-11-29 DIAGNOSIS — K219 Gastro-esophageal reflux disease without esophagitis: Secondary | ICD-10-CM | POA: Diagnosis present

## 2023-11-29 DIAGNOSIS — E66813 Obesity, class 3: Secondary | ICD-10-CM | POA: Diagnosis present

## 2023-11-29 DIAGNOSIS — Z800 Family history of malignant neoplasm of digestive organs: Secondary | ICD-10-CM | POA: Diagnosis not present

## 2023-11-29 DIAGNOSIS — Z833 Family history of diabetes mellitus: Secondary | ICD-10-CM | POA: Diagnosis not present

## 2023-11-29 DIAGNOSIS — Z6841 Body Mass Index (BMI) 40.0 and over, adult: Secondary | ICD-10-CM | POA: Diagnosis not present

## 2023-11-29 DIAGNOSIS — F3130 Bipolar disorder, current episode depressed, mild or moderate severity, unspecified: Secondary | ICD-10-CM

## 2023-11-29 DIAGNOSIS — Z825 Family history of asthma and other chronic lower respiratory diseases: Secondary | ICD-10-CM | POA: Diagnosis not present

## 2023-11-29 DIAGNOSIS — Z818 Family history of other mental and behavioral disorders: Secondary | ICD-10-CM | POA: Diagnosis not present

## 2023-11-29 DIAGNOSIS — Z882 Allergy status to sulfonamides status: Secondary | ICD-10-CM | POA: Diagnosis not present

## 2023-11-29 LAB — CBC
HCT: 45.5 % (ref 36.0–46.0)
Hemoglobin: 14.6 g/dL (ref 12.0–15.0)
MCH: 28.2 pg (ref 26.0–34.0)
MCHC: 32.1 g/dL (ref 30.0–36.0)
MCV: 88 fL (ref 80.0–100.0)
Platelets: 306 10*3/uL (ref 150–400)
RBC: 5.17 MIL/uL — ABNORMAL HIGH (ref 3.87–5.11)
RDW: 12.9 % (ref 11.5–15.5)
WBC: 8.4 10*3/uL (ref 4.0–10.5)
nRBC: 0 % (ref 0.0–0.2)

## 2023-11-29 LAB — PROTEIN AND GLUCOSE, CSF
Glucose, CSF: 64 mg/dL (ref 40–70)
Total  Protein, CSF: 27 mg/dL (ref 15–45)

## 2023-11-29 LAB — CBG MONITORING, ED: Glucose-Capillary: 154 mg/dL — ABNORMAL HIGH (ref 70–99)

## 2023-11-29 LAB — CSF CELL COUNT WITH DIFFERENTIAL
RBC Count, CSF: 2290 /mm3 — ABNORMAL HIGH
RBC Count, CSF: 43 /mm3 — ABNORMAL HIGH
Tube #: 1
Tube #: 4
WBC, CSF: 0 /mm3 (ref 0–5)
WBC, CSF: 1 /mm3 (ref 0–5)

## 2023-11-29 LAB — BASIC METABOLIC PANEL
Anion gap: 9 (ref 5–15)
BUN: 11 mg/dL (ref 6–20)
CO2: 22 mmol/L (ref 22–32)
Calcium: 9.5 mg/dL (ref 8.9–10.3)
Chloride: 105 mmol/L (ref 98–111)
Creatinine, Ser: 0.82 mg/dL (ref 0.44–1.00)
GFR, Estimated: >60 mL/min (ref >60)
Glucose, Bld: 132 mg/dL — ABNORMAL HIGH (ref 70–99)
Potassium: 4 mmol/L (ref 3.5–5.1)
Sodium: 136 mmol/L (ref 135–145)

## 2023-11-29 LAB — MENINGITIS/ENCEPHALITIS PANEL (CSF)

## 2023-11-29 LAB — GLUCOSE, CAPILLARY
Glucose-Capillary: 155 mg/dL — ABNORMAL HIGH (ref 70–99)
Glucose-Capillary: 187 mg/dL — ABNORMAL HIGH (ref 70–99)
Glucose-Capillary: 169 mg/dL — ABNORMAL HIGH (ref 70–99)
Glucose-Capillary: 147 mg/dL — ABNORMAL HIGH (ref 70–99)
Glucose-Capillary: 164 mg/dL — ABNORMAL HIGH (ref 70–99)

## 2023-11-29 LAB — HEMOGLOBIN A1C
Hgb A1c MFr Bld: 6.1 % — ABNORMAL HIGH (ref 4.8–5.6)
Mean Plasma Glucose: 128.37 mg/dL

## 2023-11-29 LAB — SARS CORONAVIRUS 2 BY RT PCR: SARS Coronavirus 2 by RT PCR: NEGATIVE

## 2023-11-29 LAB — FOLATE: Folate: 12.5 ng/mL (ref >5.9)

## 2023-11-29 LAB — RPR: RPR Ser Ql: NONREACTIVE

## 2023-11-29 LAB — VITAMIN B12: Vitamin B-12: 409 pg/mL (ref 180–914)

## 2023-11-29 LAB — HIV ANTIBODY (ROUTINE TESTING W REFLEX): HIV Screen 4th Generation wRfx: NONREACTIVE

## 2023-11-29 MED ORDER — QUETIAPINE FUMARATE 100 MG PO TABS
100.0000 mg | ORAL_TABLET | Freq: Every day | ORAL | Status: DC
  Administered 2023-11-29 – 2023-12-02 (×5): 100 mg via ORAL
  Filled 2023-11-29 (×5): qty 1

## 2023-11-29 MED ORDER — ACETAMINOPHEN 325 MG PO TABS
650.0000 mg | ORAL_TABLET | Freq: Four times a day (QID) | ORAL | Status: DC | PRN
  Administered 2023-11-29: 650 mg via ORAL
  Filled 2023-11-29: qty 2

## 2023-11-29 MED ORDER — LORAZEPAM 1 MG PO TABS: 1.0000 mg | ORAL_TABLET | ORAL | Status: AC | PRN

## 2023-11-29 MED ORDER — ACETAMINOPHEN 650 MG RE SUPP: 650.0000 mg | Freq: Four times a day (QID) | RECTAL | Status: DC | PRN

## 2023-11-29 MED ORDER — SODIUM CHLORIDE 0.9 % IV SOLN
1000.0000 mg | Freq: Every day | INTRAVENOUS | Status: DC
  Filled 2023-11-29 (×2): qty 16

## 2023-11-29 MED ORDER — FOLIC ACID 1 MG PO TABS
1.0000 mg | ORAL_TABLET | Freq: Every day | ORAL | Status: DC
  Administered 2023-11-29 – 2023-12-03 (×5): 1 mg via ORAL
  Filled 2023-11-29 (×5): qty 1

## 2023-11-29 MED ORDER — ISOSORBIDE MONONITRATE ER 30 MG PO TB24
30.0000 mg | ORAL_TABLET | Freq: Every day | ORAL | Status: DC
  Administered 2023-11-30 – 2023-12-03 (×4): 30 mg via ORAL
  Filled 2023-11-29 (×5): qty 1

## 2023-11-29 MED ORDER — THIAMINE HCL 100 MG/ML IJ SOLN: 100.0000 mg | Freq: Every day | INTRAMUSCULAR | Status: DC

## 2023-11-29 MED ORDER — ONDANSETRON HCL 4 MG PO TABS: 4.0000 mg | ORAL_TABLET | Freq: Four times a day (QID) | ORAL | Status: DC | PRN

## 2023-11-29 MED ORDER — LORAZEPAM 2 MG/ML IJ SOLN: 1.0000 mg | INTRAMUSCULAR | Status: AC | PRN

## 2023-11-29 MED ORDER — LOSARTAN POTASSIUM 50 MG PO TABS
50.0000 mg | ORAL_TABLET | Freq: Every day | ORAL | Status: DC
  Administered 2023-11-30 – 2023-12-03 (×4): 50 mg via ORAL
  Filled 2023-11-29 (×5): qty 1

## 2023-11-29 MED ORDER — ONDANSETRON HCL 4 MG/2ML IJ SOLN: 4.0000 mg | Freq: Four times a day (QID) | INTRAMUSCULAR | Status: DC | PRN

## 2023-11-29 MED ORDER — PANTOPRAZOLE SODIUM 40 MG PO TBEC
40.0000 mg | DELAYED_RELEASE_TABLET | Freq: Every day | ORAL | Status: DC
  Administered 2023-11-29 – 2023-12-03 (×5): 40 mg via ORAL
  Filled 2023-11-29 (×5): qty 1

## 2023-11-29 MED ORDER — SODIUM CHLORIDE 0.9 % IV SOLN
1000.0000 mg | INTRAVENOUS | Status: AC
  Administered 2023-11-29: 1000 mg via INTRAVENOUS
  Filled 2023-11-29 (×2): qty 16

## 2023-11-29 MED ORDER — LAMOTRIGINE 100 MG PO TABS
200.0000 mg | ORAL_TABLET | Freq: Every day | ORAL | Status: DC
  Administered 2023-11-29 – 2023-12-03 (×5): 200 mg via ORAL
  Filled 2023-11-29 (×5): qty 2

## 2023-11-29 MED ORDER — SODIUM CHLORIDE 0.9 % IV SOLN
1000.0000 mg | INTRAVENOUS | Status: AC
  Administered 2023-11-30 – 2023-12-03 (×4): 1000 mg via INTRAVENOUS
  Filled 2023-11-29 (×4): qty 16

## 2023-11-29 MED ORDER — INSULIN ASPART 100 UNIT/ML IJ SOLN
0.0000 [IU] | Freq: Every day | INTRAMUSCULAR | Status: DC
  Administered 2023-12-01: 2 [IU] via SUBCUTANEOUS
  Filled 2023-11-29: qty 0.05

## 2023-11-29 MED ORDER — SODIUM CHLORIDE 0.9 % IV SOLN: 1000.0000 mg | INTRAVENOUS | Status: DC

## 2023-11-29 MED ORDER — THIAMINE HCL 100 MG/ML IJ SOLN
250.0000 mg | Freq: Every day | INTRAVENOUS | Status: DC
  Administered 2023-12-01 – 2023-12-03 (×3): 250 mg via INTRAVENOUS
  Filled 2023-11-29 (×3): qty 2.5

## 2023-11-29 MED ORDER — THIAMINE HCL 100 MG/ML IJ SOLN
500.0000 mg | Freq: Three times a day (TID) | INTRAVENOUS | Status: AC
  Administered 2023-11-29 – 2023-11-30 (×6): 500 mg via INTRAVENOUS
  Filled 2023-11-29 (×6): qty 5

## 2023-11-29 MED ORDER — ADULT MULTIVITAMIN W/MINERALS CH
1.0000 | ORAL_TABLET | Freq: Every day | ORAL | Status: DC
  Administered 2023-11-29 – 2023-12-03 (×5): 1 via ORAL
  Filled 2023-11-29 (×5): qty 1

## 2023-11-29 MED ORDER — INSULIN ASPART 100 UNIT/ML IJ SOLN
0.0000 [IU] | Freq: Three times a day (TID) | INTRAMUSCULAR | Status: DC
  Administered 2023-11-29: 4 [IU] via SUBCUTANEOUS
  Administered 2023-11-29: 3 [IU] via SUBCUTANEOUS
  Administered 2023-11-29: 4 [IU] via SUBCUTANEOUS
  Administered 2023-11-30: 3 [IU] via SUBCUTANEOUS
  Administered 2023-11-30: 7 [IU] via SUBCUTANEOUS
  Administered 2023-11-30 – 2023-12-01 (×2): 3 [IU] via SUBCUTANEOUS
  Administered 2023-12-01: 7 [IU] via SUBCUTANEOUS
  Administered 2023-12-02: 4 [IU] via SUBCUTANEOUS
  Administered 2023-12-02: 3 [IU] via SUBCUTANEOUS
  Filled 2023-11-29: qty 0.2

## 2023-11-29 MED ORDER — ENOXAPARIN SODIUM 40 MG/0.4ML IJ SOSY
40.0000 mg | PREFILLED_SYRINGE | INTRAMUSCULAR | Status: DC
  Administered 2023-11-29 – 2023-12-03 (×5): 40 mg via SUBCUTANEOUS
  Filled 2023-11-29 (×5): qty 0.4

## 2023-11-29 MED ORDER — THIAMINE MONONITRATE 100 MG PO TABS: 100.0000 mg | ORAL_TABLET | Freq: Every day | ORAL | Status: DC

## 2023-11-29 MED ORDER — ATORVASTATIN CALCIUM 80 MG PO TABS
80.0000 mg | ORAL_TABLET | Freq: Every day | ORAL | Status: DC
  Administered 2023-11-29 – 2023-12-03 (×5): 80 mg via ORAL
  Filled 2023-11-29 (×5): qty 1

## 2023-11-29 MED ORDER — OXYCODONE HCL 5 MG PO TABS
5.0000 mg | ORAL_TABLET | ORAL | Status: DC | PRN
  Administered 2023-11-29 – 2023-12-03 (×6): 5 mg via ORAL
  Filled 2023-11-29 (×6): qty 1

## 2023-11-29 NOTE — H&P (Addendum)
History and Physical    Patient: Cheyenne Fowler:[id number] DOB: 06/06/1966 DOA: 11/28/2023 DOS: the patient was seen and examined on 11/29/2023 PCP: Julieanne Manson, MD  Patient coming from: Home  Chief Complaint:  Chief Complaint  Patient presents with   Extremity Weakness   HPI: Cheyenne Fowler is a 57 y.o. female with medical history significant of DM2, BPD, HTN.  Cocaine abuse but last use ~30 days ago she says.  Pt in to ED yesterday with new onset R arm pain, numbness, and weakness.  Symptoms onset on awaking around 0830 on 12/5  LWBS.  Pt seen at [facility] with same symptoms today since they were persistent / worsening.  Sent in to ED here at [facility].  Work up in ED at [facility] is concerning for acute transverse myelitis demonstrated on MRI of C.Spine.  MRI brain neg.    Review of Systems: As mentioned in the history of present illness. All other systems reviewed and are negative. Past Medical History:  Diagnosis Date   Anemia    Bipolar disorder (HCC)    Diabetes mellitus (HCC) 2022   type 2   Environmental and seasonal allergies 1968   GERD (gastroesophageal reflux disease)    Heart murmur    Hypertension    Upper GI bleed 01/15/2022   Past Surgical History:  Procedure Laterality Date   BIOPSY  01/17/2022   Procedure: BIOPSY;  Surgeon: Tressia Danas, MD;  Location: [facility] ENDOSCOPY;  Service: Gastroenterology;;   CESAREAN SECTION  1982, 1983, 1990   ESOPHAGOGASTRODUODENOSCOPY (EGD) WITH PROPOFOL N/A 01/17/2022   Procedure: ESOPHAGOGASTRODUODENOSCOPY (EGD) WITH PROPOFOL;  Surgeon: Tressia Danas, MD;  Location: [facility] ENDOSCOPY;  Service: Gastroenterology;  Laterality: N/A;   HERNIA REPAIR     Umbilical hernia in the 2nd grade   LEFT HEART CATH AND CORONARY ANGIOGRAPHY N/A 05/06/2023   Procedure: LEFT HEART CATH AND CORONARY ANGIOGRAPHY;  Surgeon: Tonny Bollman, MD;  Location: [facility] INVASIVE CV LAB;  Service: Cardiovascular;  Laterality: N/A;   TUBAL LIGATION  1990   Social  History:  reports that she has been smoking cigarettes. She has a 7.3 pack-year smoking history. She has never used smokeless tobacco. She reports that she does not currently use alcohol. She reports that she does not currently use drugs after having used the following drugs: Marijuana and "Crack" cocaine.  Allergies  Allergen Reactions   Penicillins Hives   Sulfonamide Derivatives Hives    Family History  Problem Relation Age of Onset   Diabetes Mother    Cancer Mother        liver--not clear if this was the primary   COPD Mother    Bipolar disorder Sister    Diabetes Sister    Schizophrenia Brother    Bipolar disorder Brother    Cancer Brother        Not sure of primary   Cancer Maternal Aunt        Breast cancer   Breast cancer Maternal Aunt        Cause of death   Cancer Maternal Aunt        pancreatic   Cancer Maternal Grandfather        throat   Colon cancer Neg Hx    Colon polyps Neg Hx    Esophageal cancer Neg Hx    Stomach cancer Neg Hx    Rectal cancer Neg Hx     Prior to Admission medications   Medication Sig Start Date End Date Taking? Authorizing  Provider  atorvastatin (LIPITOR) 80 MG tablet Take 1 tablet (80 mg total) by mouth daily. 03/19/23  Yes Julieanne Manson, MD  carvedilol (COREG) 3.125 MG tablet Take 1 tablet (3.125 mg total) by mouth 2 (two) times daily with a meal. Patient taking differently: Take 3.125 mg by mouth daily. 03/19/23  Yes Julieanne Manson, MD  cetirizine (ZYRTEC) 10 MG tablet Take 1 tablet (10 mg total) by mouth daily. 03/19/23  Yes Julieanne Manson, MD  hydrOXYzine (VISTARIL) 50 MG capsule Take 1 capsule (50 mg total) by mouth every 6 (six) hours as needed. Patient taking differently: Take 50 mg by mouth daily. 10/07/23  Yes Toy Cookey E, NP  isosorbide mononitrate (IMDUR) 30 MG 24 hr tablet Take 1 tablet (30 mg total) by mouth daily. 04/30/23  Yes Nahser, Deloris Ping, MD  lamoTRIgine (LAMICTAL) 200 MG tablet Take 1 tablet  (200 mg total) by mouth daily. 10/07/23  Yes Toy Cookey E, NP  losartan (COZAAR) 50 MG tablet Take 1 tablet (50 mg total) by mouth daily. 01/30/23  Yes Julieanne Manson, MD  metFORMIN (GLUCOPHAGE-XR) 500 MG 24 hr tablet TAKE 1 TABLET BY MOUTH TWICE DAILY WITH MEALS 03/19/23  Yes Julieanne Manson, MD  nitroGLYCERIN (NITROSTAT) 0.4 MG SL tablet Place 1 tablet (0.4 mg total) under the tongue every 5 (five) minutes as needed for chest pain. 03/19/23  Yes Julieanne Manson, MD  pantoprazole (PROTONIX) 40 MG tablet Take 1 tablet by mouth twice daily Patient taking differently: Take 40 mg by mouth daily. 03/10/23  Yes Tressia Danas, MD  QUEtiapine (SEROQUEL) 100 MG tablet Take 1 tablet (100 mg total) by mouth at bedtime. 10/07/23  Yes Toy Cookey E, NP  aspirin EC 81 MG tablet Take 1 tablet (81 mg total) by mouth daily. Swallow whole. Patient not taking: Reported on 11/28/2023 04/30/23   Nahser, Deloris Ping, MD  Glucose Blood (BLOOD GLUCOSE TEST STRIPS) STRP Check blood glucose twice daily before meals Patient not taking: Reported on 06/03/2023 05/21/23   Julieanne Manson, MD  Lancets Misc. MISC Check blood glucose twice daily before meals Patient not taking: Reported on 06/03/2023 05/21/23   Julieanne Manson, MD    Physical Exam: Vitals:   11/28/23 1741 11/28/23 1930 11/28/23 2240 11/28/23 2335  BP: (!) 151/90 (!) 150/108 (!) 150/124   Pulse: 68 82 66   Resp: 18 17 13 14   Temp: 97.9 F (36.6 C) 98.1 F (36.7 C)  97.9 F (36.6 C)  TempSrc: Oral   Oral  SpO2: 100% 99% 99%   Weight:      Height:       Constitutional: NAD, calm, comfortable Respiratory: clear to auscultation bilaterally, no wheezing, no crackles. Normal respiratory effort. No accessory muscle use.  Cardiovascular: Regular rate and rhythm, no murmurs / rubs / gallops. No extremity edema. 2+ pedal pulses. No carotid bruits.  Abdomen: no tenderness, no masses palpated. No hepatosplenomegaly. Bowel sounds positive.   Neurologic: 1/5 strength in RUE, decreased sensation to RUE.  Some BLE weakness as well. Psychiatric: Normal judgment and insight. Alert and oriented x 3. Normal mood.   Data Reviewed:    Labs on Admission: I have personally reviewed following labs and imaging studies  CBC: Recent Labs  Lab 11/27/23 1051 11/28/23 1028  WBC 8.4 7.1  NEUTROABS  --  3.1  HGB 13.9 14.5  HCT 43.6 45.7  MCV 89.2 91.4  PLT 293 291   Basic Metabolic Panel: Recent Labs  Lab 11/27/23 1051 11/28/23 1028  NA 139  139  K 4.0 4.1  CL 107 104  CO2 22 28  GLUCOSE 115* 124*  BUN 15 13  CREATININE 0.86 0.76  CALCIUM 9.6 9.4   GFR: Estimated Creatinine Clearance: 91.2 mL/min (by C-G formula based on SCr of 0.76 mg/dL). Liver Function Tests: Recent Labs  Lab 11/28/23 1028  AST 23  ALT 31  ALKPHOS 89  BILITOT 0.4  PROT 7.6  ALBUMIN 4.2   No results for input(s): "LIPASE", "AMYLASE" in the last 168 hours. No results for input(s): "AMMONIA" in the last 168 hours. Coagulation Profile: Recent Labs  Lab 11/28/23 1028  INR 0.9   Cardiac Enzymes: Recent Labs  Lab 11/28/23 1048  CKTOTAL 156   BNP (last 3 results) No results for input(s): "PROBNP" in the last 8760 hours. HbA1C: No results for input(s): "HGBA1C" in the last 72 hours. CBG: Recent Labs  Lab 11/28/23 1022  GLUCAP 122*   Lipid Profile: No results for input(s): "CHOL", "HDL", "LDLCALC", "TRIG", "CHOLHDL", "LDLDIRECT" in the last 72 hours. Thyroid Function Tests: No results for input(s): "TSH", "T4TOTAL", "FREET4", "T3FREE", "THYROIDAB" in the last 72 hours. Anemia Panel: No results for input(s): "VITAMINB12", "FOLATE", "FERRITIN", "TIBC", "IRON", "RETICCTPCT" in the last 72 hours. Urine analysis:    Component Value Date/Time   COLORURINE YELLOW 11/28/2023 1148   APPEARANCEUR HAZY (A) 11/28/2023 1148   LABSPEC 1.014 11/28/2023 1148   PHURINE 6.0 11/28/2023 1148   GLUCOSEU NEGATIVE 11/28/2023 1148   HGBUR NEGATIVE  11/28/2023 1148   BILIRUBINUR NEGATIVE 11/28/2023 1148   KETONESUR NEGATIVE 11/28/2023 1148   PROTEINUR NEGATIVE 11/28/2023 1148   UROBILINOGEN 1.0 07/12/2012 2150   NITRITE POSITIVE (A) 11/28/2023 1148   LEUKOCYTESUR NEGATIVE 11/28/2023 1148    Radiological Exams on Admission: MR Cervical Spine W and Wo Contrast  Result Date: 11/28/2023 CLINICAL DATA:  Right arm pain, numbness and weakness EXAM: MRI CERVICAL SPINE WITHOUT AND WITH CONTRAST TECHNIQUE: Multiplanar and multiecho pulse sequences of the cervical spine, to include the craniocervical junction and cervicothoracic junction, were obtained without and with intravenous contrast. CONTRAST:  10mL GADAVIST GADOBUTROL 1 MMOL/ML IV SOLN COMPARISON:  None Available. FINDINGS: Alignment: Physiologic. Vertebrae: No fracture, evidence of discitis, or bone lesion. Cord: There is hyperintense T2-weighted signal within the ventral spinal cord, asymmetric to the right at the C3-6 levels. No abnormal contrast enhancement. Posterior Fossa, vertebral arteries, paraspinal tissues: Negative. Disc levels: C1-2: Unremarkable. C2-3: Normal disc space and facet joints. There is no spinal canal stenosis. No neural foraminal stenosis. C3-4: Intermediate sized disc osteophyte complex. There is no spinal canal stenosis. Mild bilateral neural foraminal stenosis. C4-5: Normal disc space and facet joints. There is no spinal canal stenosis. No neural foraminal stenosis. C5-6: Small disc bulge. There is no spinal canal stenosis. No neural foraminal stenosis. C6-7: Left asymmetric disc bulge with bilateral uncovertebral hypertrophy. Mild spinal canal stenosis. Severe left neural foraminal stenosis. C7-T1: Normal disc space and facet joints. There is no spinal canal stenosis. No neural foraminal stenosis. IMPRESSION: 1. Hyperintense T2-weighted signal within the ventral spinal cord, asymmetric to the right at the C3-6 levels. This is most consistent with transverse myelitis or  other nonspecific demyelinating process. 2. Mild spinal canal stenosis and severe left neural foraminal stenosis at C6-7. Electronically Signed   By: Deatra Robinson M.D.   On: 11/28/2023 21:32   MR BRAIN WO CONTRAST  Result Date: 11/28/2023 CLINICAL DATA:  Neuro deficit, acute, stroke suspected head CT January 10, 2019. EXAM: MRI HEAD WITHOUT CONTRAST TECHNIQUE: Multiplanar,  multiecho pulse sequences of the brain and surrounding structures were obtained without intravenous contrast. COMPARISON:  None Available. FINDINGS: Brain: No acute infarction, hemorrhage, hydrocephalus, extra-axial collection or mass lesion. Vascular: Normal flow voids. Incidentally noted hypoplastic vertebrobasilar system. Skull and upper cervical spine: Diffuse decrease of the T1 signal the visualized upper cervical spine and calvarium, may be related to red marrow reconversion. Sinuses/Orbits: Bilateral proptosis.  Paranasal sinuses are clear. Other: None. IMPRESSION: 1. No acute intracranial abnormality. 2. Diffuse decrease of the T1 signal the visualized upper cervical spine and calvarium, may be related to red marrow reconversion. Electronically Signed   By: Baldemar Lenis M.D.   On: 11/28/2023 12:09   DG Shoulder Right  Result Date: 11/27/2023 CLINICAL DATA:  Right shoulder pain and numbness. EXAM: RIGHT SHOULDER - 2+ VIEW COMPARISON:  None Available. FINDINGS: No acute fracture or dislocation. No aggressive osseous lesion. Glenohumeral and acromioclavicular joints are normal in alignment and exhibit mild-to-moderate degenerative changes. No soft tissue swelling. No radiopaque foreign bodies. IMPRESSION: *No acute osseous abnormality of the right shoulder joint. Mild-to-moderate degenerative joint disease. Electronically Signed   By: Jules Schick M.D.   On: 11/27/2023 12:13    EKG: Independently reviewed.   Assessment and Plan: * Acute transverse myelitis (HCC) Pt with acute transverse myelitis demonstrated  on MRI C spine w/wo contrast. Lesion location would be consistent with her symptoms of R arm pain, numbness, and weakness. DDx for cause of acute transverse myelitis is broad D/w Dr. Derry Lory from neuro: Start empiric solumedrol 1gm IV daily x5 days 1st dose of solumedrol now Admit patient to [facility] He is ordering the LP labs He is ordering MOG AB and AQP4 AB Ordering remainder of the recommended extended serum work up: Antinuclear antibodies (AN?), anti double-stranded deoxyribonucleic acid (ds-DNA) Antibodies to extractable nuclear antigen (Ro/SSA, and La/SSB antibodies) Rheumatoid factor Antiphospholipid antibodies Antineutrophil cytoplasmic antibodies Serum ?o???r and ceruloplasmin levels vitamin B12, folate, methylmalonic acid, and homocysteine levels vitamin E level ??V screen RPR Lyme IgM and IgG VZV IgM and IgG Human T-lymphotropic virus 1 (HTLV-1) serology COVID-19 test Morphine PRN pain  Type 2 diabetes mellitus (HCC) Going to be more difficult to control given high dose steroids for next 5 days. Hold metformin Resistant scale SSI AC/HS for the moment titrate up further with mealtime / basal insulin additions if needed.  HTN (hypertension) Cont home Losartan and Imdur.  Alcohol use CIWA for EtOH withdrawal prevention  Cocaine use disorder, mild, abuse (HCC) Cocaine positive UDS, not clear that this causing or contributing to acute transverse myelitis though.  Bipolar I disorder, most recent episode depressed (HCC) Cont home Lamictal and Seroquel. Hopefully this isnt exacerbated by steroid      Advance Care Planning:   Code Status: Full Code  Consults: Dr. Derry Lory  Family Communication: Husband at bedside.  All questions answered to the best of my abilities.  Severity of Illness: The appropriate patient status for this patient is INPATIENT. Inpatient status is judged to be reasonable and necessary in order to provide the required intensity of service to  ensure the patient's safety. The patient's presenting symptoms, physical exam findings, and initial radiographic and laboratory data in the context of their chronic comorbidities is felt to place them at high risk for further clinical deterioration. Furthermore, it is not anticipated that the patient will be medically stable for discharge from the hospital within 2 midnights of admission.   * I certify that at the point of admission it is  my clinical judgment that the patient will require inpatient hospital care spanning beyond 2 midnights from the point of admission due to high intensity of service, high risk for further deterioration and high frequency of surveillance required.*  Author: Hillary Bow., DO 11/29/2023 12:28 AM  For on call review [email or web address].

## 2023-11-29 NOTE — Assessment & Plan Note (Addendum)
Cocaine positive UDS, not clear that this causing or contributing to acute transverse myelitis though.

## 2023-11-29 NOTE — ED Notes (Signed)
Provided care link wit med neccess form and face sheet

## 2023-11-29 NOTE — Consult Note (Signed)
NEUROLOGY CONSULT NOTE   Date of service: November 29, 2023 Patient Name: ALMERINDA HACHE MRN:  [id number] DOB:  1966-02-28 Chief Complaint: "R sided weakness and pain" Requesting Provider: Hillary Bow, DO  History of Present Illness  LENIKA OBRIANT is a 57 y.o. female with hx of GERD, HTN, GI bleed, DM2, cocaine use, EtOH use p/w RUE weakness and pain. She took a nap and woke up around 0830 on 11/27/23 with these  symptoms. She came to the ED on the day of symptoms onset but left without being seen due to extended wait time. She then went to [facility] where she was recommend coming to the ED again. She had C spine xray which was not revealing. She came in to [facility] where she had workup with MRI Brain which was non revealing. 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  She had LP in the ED with preliminary studies with no pleocytosis, normal protein and glucose. Neurology consulted for further evaluation and workup.  No prior hx of similar symptoms. Uses crack cocaine and marijuana about once a week, drinks 40oz of beer every day and last drink was on Wednesday night.  She denies any recent signs or symptoms of an infection.  She denies any fever, no recent URI or UTI-like gastroenteritis-like symptoms.   ROS  Comprehensive ROS performed and pertinent positives documented in HPI   Past History   Past Medical History:  Diagnosis Date   Anemia    Bipolar disorder (HCC)    Diabetes mellitus (HCC) 2022   type 2   Environmental and seasonal allergies 1968   GERD (gastroesophageal reflux disease)    Heart murmur    Hypertension    Upper GI bleed 01/15/2022    Past Surgical History:  Procedure Laterality Date   BIOPSY  01/17/2022   Procedure: BIOPSY;  Surgeon: Tressia Danas, MD;  Location: [facility] ENDOSCOPY;  Service: Gastroenterology;;   CESAREAN SECTION  1982, 1983, 1990   ESOPHAGOGASTRODUODENOSCOPY (EGD)  WITH PROPOFOL N/A 01/17/2022   Procedure: ESOPHAGOGASTRODUODENOSCOPY (EGD) WITH PROPOFOL;  Surgeon: Tressia Danas, MD;  Location: [facility] ENDOSCOPY;  Service: Gastroenterology;  Laterality: N/A;   HERNIA REPAIR     Umbilical hernia in the 2nd grade   LEFT HEART CATH AND CORONARY ANGIOGRAPHY N/A 05/06/2023   Procedure: LEFT HEART CATH AND CORONARY ANGIOGRAPHY;  Surgeon: Tonny Bollman, MD;  Location: [facility] INVASIVE CV LAB;  Service: Cardiovascular;  Laterality: N/A;   TUBAL LIGATION  1990    Family History: Family History  Problem Relation Age of Onset   Diabetes Mother    Cancer Mother        liver--not clear if this was the primary   COPD Mother    Bipolar disorder Sister    Diabetes Sister    Schizophrenia Brother    Bipolar disorder Brother    Cancer Brother        Not sure of primary   Cancer Maternal Aunt        Breast cancer   Breast cancer Maternal Aunt        Cause of death   Cancer Maternal Aunt        pancreatic   Cancer Maternal Grandfather        throat   Colon cancer Neg Hx    Colon polyps Neg Hx    Esophageal cancer Neg Hx    Stomach cancer Neg Hx  Rectal cancer Neg Hx     Social History  reports that she has been smoking cigarettes. She has a 7.3 pack-year smoking history. She has never used smokeless tobacco. She reports that she does not currently use alcohol. She reports that she does not currently use drugs after having used the following drugs: Marijuana and "Crack" cocaine.  Allergies  Allergen Reactions   Penicillins Hives   Sulfonamide Derivatives Hives    Medications   Current Facility-Administered Medications:    acetaminophen (TYLENOL) tablet 650 mg, 650 mg, Oral, Q6H PRN **OR** acetaminophen (TYLENOL) suppository 650 mg, 650 mg, Rectal, Q6H PRN, Julian Reil, Jared M, DO   atorvastatin (LIPITOR) tablet 80 mg, 80 mg, Oral, Daily, Julian Reil, Jared M, DO   enoxaparin (LOVENOX) injection 40 mg, 40 mg, Subcutaneous, Q24H, Julian Reil, Jared M, DO   insulin  aspart (novoLOG) injection 0-20 Units, 0-20 Units, Subcutaneous, TID WC, Julian Reil, Jared M, DO   insulin aspart (novoLOG) injection 0-5 Units, 0-5 Units, Subcutaneous, QHS, Gardner, Jared M, DO   isosorbide mononitrate (IMDUR) 24 hr tablet 30 mg, 30 mg, Oral, Daily, Julian Reil, Jared M, DO   lamoTRIgine (LAMICTAL) tablet 200 mg, 200 mg, Oral, Daily, Julian Reil, Jared M, DO   losartan (COZAAR) tablet 50 mg, 50 mg, Oral, Daily, Julian Reil, Jared M, DO   methylPREDNISolone sodium succinate (SOLU-MEDROL) 1,000 mg in sodium chloride 0.9 % 50 mL IVPB, 1,000 mg, Intravenous, NOW, Julian Reil, Jared M, DO, Last Rate: 66 mL/hr at 11/29/23 0122, 1,000 mg at 11/29/23 0122   [START ON 11/30/2023] methylPREDNISolone sodium succinate (SOLU-MEDROL) 1,000 mg in sodium chloride 0.9 % 50 mL IVPB, 1,000 mg, Intravenous, Q24H, Julian Reil, Jared M, DO   morphine (PF) 2 MG/ML injection 2-4 mg, 2-4 mg, Intravenous, Q4H PRN, Julian Reil, Jared M, DO, 2 mg at 11/29/23 0020   ondansetron (ZOFRAN) tablet 4 mg, 4 mg, Oral, Q6H PRN **OR** ondansetron (ZOFRAN) injection 4 mg, 4 mg, Intravenous, Q6H PRN, Julian Reil, Jared M, DO   pantoprazole (PROTONIX) EC tablet 40 mg, 40 mg, Oral, Daily, Julian Reil, Jared M, DO   QUEtiapine (SEROQUEL) tablet 100 mg, 100 mg, Oral, QHS, Gardner, Jared M, DO, 100 mg at 11/29/23 0121  Current Outpatient Medications:    atorvastatin (LIPITOR) 80 MG tablet, Take 1 tablet (80 mg total) by mouth daily., Disp: 30 tablet, Rfl: 11   carvedilol (COREG) 3.125 MG tablet, Take 1 tablet (3.125 mg total) by mouth 2 (two) times daily with a meal. (Patient taking differently: Take 3.125 mg by mouth daily.), Disp: 60 tablet, Rfl: 11   cetirizine (ZYRTEC) 10 MG tablet, Take 1 tablet (10 mg total) by mouth daily., Disp: 30 tablet, Rfl: 11   hydrOXYzine (VISTARIL) 50 MG capsule, Take 1 capsule (50 mg total) by mouth every 6 (six) hours as needed. (Patient taking differently: Take 50 mg by mouth daily.), Disp: 120 capsule, Rfl: 3   isosorbide  mononitrate (IMDUR) 30 MG 24 hr tablet, Take 1 tablet (30 mg total) by mouth daily., Disp: 90 tablet, Rfl: 3   lamoTRIgine (LAMICTAL) 200 MG tablet, Take 1 tablet (200 mg total) by mouth daily., Disp: 30 tablet, Rfl: 3   losartan (COZAAR) 50 MG tablet, Take 1 tablet (50 mg total) by mouth daily., Disp: 30 tablet, Rfl: 11   metFORMIN (GLUCOPHAGE-XR) 500 MG 24 hr tablet, TAKE 1 TABLET BY MOUTH TWICE DAILY WITH MEALS, Disp: 60 tablet, Rfl: 11   nitroGLYCERIN (NITROSTAT) 0.4 MG SL tablet, Place 1 tablet (0.4 mg total) under the tongue every 5 (five) minutes  as needed for chest pain., Disp: 25 tablet, Rfl: 1   pantoprazole (PROTONIX) 40 MG tablet, Take 1 tablet by mouth twice daily (Patient taking differently: Take 40 mg by mouth daily.), Disp: 180 tablet, Rfl: 0   QUEtiapine (SEROQUEL) 100 MG tablet, Take 1 tablet (100 mg total) by mouth at bedtime., Disp: 30 tablet, Rfl: 3   aspirin EC 81 MG tablet, Take 1 tablet (81 mg total) by mouth daily. Swallow whole. (Patient not taking: Reported on 11/28/2023), Disp: 90 tablet, Rfl: 12   Glucose Blood (BLOOD GLUCOSE TEST STRIPS) STRP, Check blood glucose twice daily before meals (Patient not taking: Reported on 06/03/2023), Disp: 100 strip, Rfl: 11   Lancets Misc. MISC, Check blood glucose twice daily before meals (Patient not taking: Reported on 06/03/2023), Disp: 100 each, Rfl: 11  Vitals   Vitals:   11/28/23 1930 11/28/23 2240 11/28/23 2315 11/28/23 2335  BP: (!) 150/108 (!) 150/124 136/84   Pulse: 82 66 71   Resp: 17 13 11 14   Temp: 98.1 F (36.7 C)   97.9 F (36.6 C)  TempSrc:    Oral  SpO2: 99% 99% 99%   Weight:      Height:        Body mass index is 45.73 kg/m.  Physical Exam   General: Laying comfortably in bed; in no acute distress.  HENT: Normal oropharynx and mucosa. Normal external appearance of ears and nose.  Neck: Supple, no pain or tenderness  CV: No JVD. No peripheral edema.  Pulmonary: Symmetric Chest rise. Normal respiratory  effort.  Abdomen: Soft to touch, non-tender.  Ext: No cyanosis, edema, or deformity  Skin: No rash. Normal palpation of skin.   Musculoskeletal: Normal digits and nails by inspection. No clubbing.   Neurologic Examination  Mental status/Cognition: Alert, oriented to self, place, month and year, good attention.  Speech/language: Fluent, comprehension intact, object naming intact, repetition intact.  Cranial nerves:   CN II Pupils equal and reactive to light, no VF deficits    CN III,IV,VI EOM intact, no gaze preference or deviation, no nystagmus    CN V normal sensation in V1, V2, and V3 segments bilaterally    CN VII no asymmetry, no nasolabial fold flattening    CN VIII normal hearing to speech    CN IX & X normal palatal elevation, no uvular deviation    CN XI 5/5 head turn and 5/5 shoulder shrug bilaterally    CN XII midline tongue protrusion    Motor:  Muscle bulk: normal, tone normal. Mvmt Root Nerve  Muscle Right Left Comments  SA C5/6 Ax Deltoid 4 5   EF C5/6 Mc Biceps 2 5   EE C6/7/8 Rad Triceps 3 5   WF C6/7 Med FCR 1 5   WE C7/8 PIN ECU 1 5   F Ab C8/T1 U ADM/FDI 4 5   HF L1/2/3 Fem Illopsoas 3 4   KE L2/3/4 Fem Quad 4 4   DF L4/5 D Peron Tib Ant 4+ 4+   PF S1/2 Tibial Grc/Sol 2 4    Sensation:  Light touch Intact throughout   Pin prick    Temperature    Vibration   Proprioception    Coordination/Complex Motor:  - Finger to Nose intact bilaterally - Heel to shin intact bilaterally - Rapid alternating movement are slowed on the right side. - Gait: Deferred for patient's safety.   Labs/Imaging/Neurodiagnostic studies   CBC:  Recent Labs  Lab 2023-12-20 1051 11/28/23 1028  WBC 8.4 7.1  NEUTROABS  --  3.1  HGB 13.9 14.5  HCT 43.6 45.7  MCV 89.2 91.4  PLT 293 291   Basic Metabolic Panel:  Lab Results  Component Value Date   NA 139 11/28/2023   K 4.1 11/28/2023   CO2 28 11/28/2023   GLUCOSE 124 (H) 11/28/2023   BUN 13 11/28/2023   CREATININE 0.76  11/28/2023   CALCIUM 9.4 11/28/2023   GFRNONAA >60 11/28/2023   GFRAA 86 04/27/2020   Lipid Panel:  Lab Results  Component Value Date   LDLCALC 73 05/20/2023   HgbA1c:  Lab Results  Component Value Date   HGBA1C 6.0 (H) 03/19/2023   Urine Drug Screen:     Component Value Date/Time   LABOPIA POSITIVE (A) 11/28/2023 1148   COCAINSCRNUR POSITIVE (A) 11/28/2023 1148   LABBENZ NONE DETECTED 11/28/2023 1148   AMPHETMU NONE DETECTED 11/28/2023 1148   THCU NONE DETECTED 11/28/2023 1148   LABBARB NONE DETECTED 11/28/2023 1148    Alcohol Level     Component Value Date/Time   ETH <10 01/15/2022 1550   INR  Lab Results  Component Value Date   INR 0.9 11/28/2023   APTT  Lab Results  Component Value Date   APTT 27 11/28/2023   AED levels: No results found for: "PHENYTOIN", "ZONISAMIDE", "LAMOTRIGINE", "LEVETIRACETA"  MRI Brain without contrast(Personally reviewed): 1. No acute intracranial abnormality. 2. Diffuse decrease of the T1 signal the visualized upper cervical spine and calvarium, may be related to red marrow reconversion.  MR C spine w + w/o C(Personally reviewed): 1. Hyperintense T2-weighted signal within the ventral spinal cord, asymmetric to the right at the C3-6 levels. This is most consistent with transverse myelitis or other nonspecific demyelinating process. 2. Mild spinal canal stenosis and severe left neural foraminal stenosis at C6-7.  MRI Brachial plexus w + w/o C: pending  CSF studies: CSF cell count and differential no pleocytosis.  Protein and glucose is normal.  Rest of the studies are pending at this time.  ASSESSMENT   AERIANA MCCRACKIN is a 57 y.o. female with hx of GERD, HTN, GI bleed, DM2, cocaine use, EtOH use who woke up with RUE weakness and pain. Exam also notable for BL lower ext weakness. She was found to have hyperintense T2/STIR signal within the ventral spinal cord, asymmetric to the right at the C3-C6 level which is most consistent  with transverse myelitis. No other demyelinating lesions noted on MRI Brain or elsewhere in the C spine. Extensive laboratory studies to evaluate for nutritional, infectious and autoimmune etiologies is pending.  She had LP in the ED at [facility] long with initial studies with no pleocytosis or elevated protein.  RECOMMENDATIONS  - IV solumedrol 1000mg  daily x 5 doses. - follow up on pending CSF and serum labs. - neurology will continue to follow. - MRI T spine to evaluate for any additional demyelinating lesions. - CIWA protocol to monitor for EtOH withdrawal. - thaimine wernicke's dosing protocol. ______________________________________________________________________    Welton Flakes, MD [facility]

## 2023-11-29 NOTE — Assessment & Plan Note (Deleted)
Cocaine abuse, but doubtful that's contributing to her presentation today.

## 2023-11-29 NOTE — Assessment & Plan Note (Signed)
CIWA for EtOH withdrawal prevention

## 2023-11-29 NOTE — Plan of Care (Signed)
  Problem: Pain Management: Goal: General experience of comfort will improve Outcome: Progressing   Problem: Safety: Goal: Ability to remain free from injury will improve Outcome: Progressing   Problem: Skin Integrity: Goal: Risk for impaired skin integrity will decrease Outcome: Progressing

## 2023-11-29 NOTE — Assessment & Plan Note (Addendum)
Pt with acute transverse myelitis demonstrated on MRI C spine w/wo contrast. Lesion location would be consistent with her symptoms of R arm pain, numbness, and weakness. DDx for cause of acute transverse myelitis is broad D/w Dr. Derry Lory from neuro: Start empiric solumedrol 1gm IV daily x5 days 1st dose of solumedrol now Admit patient to [facility] He is ordering the LP labs He is ordering MOG AB and AQP4 AB Ordering remainder of the recommended extended serum work up: Antinuclear antibodies (AN?), anti double-stranded deoxyribonucleic acid (ds-DNA) Antibodies to extractable nuclear antigen (Ro/SSA, and La/SSB antibodies) Rheumatoid factor Antiphospholipid antibodies Antineutrophil cytoplasmic antibodies Serum ?o???r and ceruloplasmin levels vitamin B12, folate, methylmalonic acid, and homocysteine levels vitamin E level ??V screen RPR Lyme IgM and IgG VZV IgM and IgG Human T-lymphotropic virus 1 (HTLV-1) serology COVID-19 test Morphine PRN pain

## 2023-11-29 NOTE — ED Notes (Addendum)
Called [facility] to set up transportation for transfer

## 2023-11-29 NOTE — Assessment & Plan Note (Addendum)
Going to be more difficult to control given high dose steroids for next 5 days. Hold metformin Resistant scale SSI AC/HS for the moment titrate up further with mealtime / basal insulin additions if needed.

## 2023-11-29 NOTE — Assessment & Plan Note (Addendum)
Cont home Lamictal and Seroquel. Hopefully this isnt exacerbated by steroid

## 2023-11-29 NOTE — Assessment & Plan Note (Signed)
Cont home Losartan and Imdur.

## 2023-11-29 NOTE — Plan of Care (Signed)
  Problem: Education: Goal: Ability to describe self-care measures that may prevent or decrease complications (Diabetes Survival Skills Education) will improve 11/29/2023 0410 by Faustino Congress, RN Outcome: Progressing 11/29/2023 0410 by Faustino Congress, RN Outcome: Progressing Goal: Individualized Educational Video(s) 11/29/2023 0410 by Faustino Congress, RN Outcome: Progressing 11/29/2023 0410 by Faustino Congress, RN Outcome: Progressing   Problem: Coping: Goal: Ability to adjust to condition or change in health will improve 11/29/2023 0410 by Faustino Congress, RN Outcome: Progressing 11/29/2023 0410 by Faustino Congress, RN Outcome: Progressing   Problem: Fluid Volume: Goal: Ability to maintain a balanced intake and output will improve 11/29/2023 0410 by Faustino Congress, RN Outcome: Progressing 11/29/2023 0410 by Faustino Congress, RN Outcome: Progressing   Problem: Health Behavior/Discharge Planning: Goal: Ability to identify and utilize available resources and services will improve 11/29/2023 0410 by Faustino Congress, RN Outcome: Progressing 11/29/2023 0410 by Faustino Congress, RN Outcome: Progressing Goal: Ability to manage health-related needs will improve Outcome: Progressing   Problem: Metabolic: Goal: Ability to maintain appropriate glucose levels will improve Outcome: Progressing   Problem: Nutritional: Goal: Maintenance of adequate nutrition will improve Outcome: Progressing Goal: Progress toward achieving an optimal weight will improve Outcome: Progressing   Problem: Skin Integrity: Goal: Risk for impaired skin integrity will decrease Outcome: Progressing   Problem: Tissue Perfusion: Goal: Adequacy of tissue perfusion will improve Outcome: Progressing   Problem: Education: Goal: Knowledge of General Education information will improve Description: Including pain rating scale, medication(s)/side effects and non-pharmacologic  comfort measures Outcome: Progressing   Problem: Health Behavior/Discharge Planning: Goal: Ability to manage health-related needs will improve Outcome: Progressing   Problem: Clinical Measurements: Goal: Ability to maintain clinical measurements within normal limits will improve Outcome: Progressing Goal: Will remain free from infection Outcome: Progressing Goal: Diagnostic test results will improve Outcome: Progressing Goal: Respiratory complications will improve Outcome: Progressing Goal: Cardiovascular complication will be avoided Outcome: Progressing   Problem: Activity: Goal: Risk for activity intolerance will decrease Outcome: Progressing   Problem: Nutrition: Goal: Adequate nutrition will be maintained Outcome: Progressing   Problem: Coping: Goal: Level of anxiety will decrease Outcome: Progressing   Problem: Elimination: Goal: Will not experience complications related to bowel motility Outcome: Progressing Goal: Will not experience complications related to urinary retention Outcome: Progressing   Problem: Pain Management: Goal: General experience of comfort will improve Outcome: Progressing   Problem: Safety: Goal: Ability to remain free from injury will improve Outcome: Progressing   Problem: Skin Integrity: Goal: Risk for impaired skin integrity will decrease Outcome: Progressing

## 2023-11-29 NOTE — ED Notes (Signed)
ED TO INPATIENT HANDOFF REPORT  Name/Age/Gender Cheyenne Fowler 57 y.o. female  Code Status    Code Status Orders  (From admission, onward)           Start     Ordered   11/29/23 0002  Full code  Continuous       Question:  By:  Answer:  Default: patient does not have capacity for decision making, no surrogate or prior directive available   11/29/23 0004           Code Status History     Date Active Date Inactive Code Status Order ID Comments User Context   05/06/2023 1457 05/06/2023 2247 Full Code [id number]  Tonny Bollman, MD Inpatient   01/15/2022 1916 01/18/2022 1636 Full Code [id number]  Charlsie Quest, MD ED   05/21/2012 2025 05/22/2012 0130 Full Code [id number]  Raeford Razor, MD ED       Home/SNF/Other Home  Chief Complaint Acute transverse myelitis (HCC) [G37.3]  Level of Care/Admitting Diagnosis ED Disposition     ED Disposition  Admit   Condition  --   Comment  Hospital Area: [facility] [[id number]]  Level of Care: Telemetry Medical [[id number]]  May admit patient to [facility] or [facility] if equivalent level of care is available:: No  Covid Evaluation: Asymptomatic - no recent exposure (last 10 days) testing not required  Diagnosis: Acute transverse myelitis [facility]) [[id number]]  Admitting Physician: Hillary Bow [[id number]]  Attending Physician: Hillary Bow [phone]  Certification:: I certify this patient will need inpatient services for at least 2 midnights  Expected Medical Readiness: 12/05/2023          Medical History Past Medical History:  Diagnosis Date   Anemia    Bipolar disorder (HCC)    Diabetes mellitus (HCC) 2022   type 2   Environmental and seasonal allergies 1968   GERD (gastroesophageal reflux disease)    Heart murmur    Hypertension    Upper GI bleed 01/15/2022    Allergies Allergies  Allergen Reactions   Penicillins Hives   Sulfonamide Derivatives Hives    IV Location/Drains/Wounds Patient  Lines/Drains/Airways Status     Active Line/Drains/Airways     Name Placement date Placement time Site Days   Peripheral IV 11/28/23 20 G 1" Left Antecubital 11/28/23  1028  Antecubital  1            Labs/Imaging Results for orders placed or performed during the hospital encounter of 11/28/23 (from the past 48 hour(s))  CBG monitoring, ED     Status: Abnormal   Collection Time: 11/28/23 10:22 AM  Result Value Ref Range   Glucose-Capillary 122 (H) 70 - 99 mg/dL    Comment: Glucose reference range applies only to samples taken after fasting for at least 8 hours.  Protime-INR     Status: None   Collection Time: 11/28/23 10:28 AM  Result Value Ref Range   Prothrombin Time 12.6 11.4 - 15.2 seconds   INR 0.9 0.8 - 1.2    Comment: (NOTE) INR goal varies based on device and disease states. Performed at [facility], [address] [place], [place] [address]   APTT     Status: None   Collection Time: 11/28/23 10:28 AM  Result Value Ref Range   aPTT 27 24 - 36 seconds    Comment: Performed at [facility], [address], [place], [place] [address]  CBC  Status: None   Collection Time: 11/28/23 10:28 AM  Result Value Ref Range   WBC 7.1 4.0 - 10.5 K/uL   RBC 5.00 3.87 - 5.11 MIL/uL   Hemoglobin 14.5 12.0 - 15.0 g/dL   HCT 16.1 09.6 - 04.5 %   MCV 91.4 80.0 - 100.0 fL   MCH 29.0 26.0 - 34.0 pg   MCHC 31.7 30.0 - 36.0 g/dL   RDW 40.9 81.1 - 91.4 %   Platelets 291 150 - 400 K/uL   nRBC 0.0 0.0 - 0.2 %    Comment: Performed at [facility], [address] [place], [place] [address]  Differential     Status: None   Collection Time: 11/28/23 10:28 AM  Result Value Ref Range   Neutrophils Relative % 43 %   Neutro Abs 3.1 1.7 - 7.7 K/uL   Lymphocytes Relative 45 %   Lymphs Abs 3.2 0.7 - 4.0 K/uL   Monocytes Relative 9 %   Monocytes Absolute 0.6 0.1 - 1.0 K/uL   Eosinophils Relative 3 %   Eosinophils Absolute 0.2  0.0 - 0.5 K/uL   Basophils Relative 0 %   Basophils Absolute 0.0 0.0 - 0.1 K/uL   Immature Granulocytes 0 %   Abs Immature Granulocytes 0.02 0.00 - 0.07 K/uL    Comment: Performed at [facility], [address], [place], [place] [address]  Comprehensive metabolic panel     Status: Abnormal   Collection Time: 11/28/23 10:28 AM  Result Value Ref Range   Sodium 139 135 - 145 mmol/L   Potassium 4.1 3.5 - 5.1 mmol/L   Chloride 104 98 - 111 mmol/L   CO2 28 22 - 32 mmol/L   Glucose, Bld 124 (H) 70 - 99 mg/dL    Comment: Glucose reference range applies only to samples taken after fasting for at least 8 hours.   BUN 13 6 - 20 mg/dL   Creatinine, Ser 8.65 0.44 - 1.00 mg/dL   Calcium 9.4 8.9 - 78.4 mg/dL   Total Protein 7.6 6.5 - 8.1 g/dL   Albumin 4.2 3.5 - 5.0 g/dL   AST 23 15 - 41 U/L   ALT 31 0 - 44 U/L   Alkaline Phosphatase 89 38 - 126 U/L   Total Bilirubin 0.4 <1.2 mg/dL   GFR, Estimated >69 >62 mL/min    Comment: (NOTE) Calculated using the CKD-EPI Creatinine Equation (2021)    Anion gap 7 5 - 15    Comment: Performed at [facility], [address], [place], [place] [address]  CK     Status: None   Collection Time: 11/28/23 10:48 AM  Result Value Ref Range   Total CK 156 38 - 234 U/L    Comment: Performed at [facility], [address] [place], [place] [address]  Sedimentation rate     Status: None   Collection Time: 11/28/23 10:48 AM  Result Value Ref Range   Sed Rate 4 0 - 22 mm/hr    Comment: Performed at [facility], [address] [place], [place] [address]  C-reactive protein     Status: None   Collection Time: 11/28/23 10:48 AM  Result Value Ref Range   CRP 0.7 <1.0 mg/dL    Comment: Performed at [facility], [address] [place], [place] [address]  Urine rapid drug screen (hosp performed)     Status: Abnormal   Collection Time: 11/28/23 11:48 AM  Result Value Ref Range    Opiates POSITIVE (A) NONE DETECTED   Cocaine POSITIVE (A) NONE DETECTED   Benzodiazepines NONE DETECTED NONE DETECTED   Amphetamines NONE DETECTED NONE DETECTED   Tetrahydrocannabinol NONE DETECTED NONE DETECTED   Barbiturates NONE DETECTED NONE DETECTED    Comment: (NOTE) DRUG SCREEN FOR MEDICAL PURPOSES ONLY.  IF CONFIRMATION IS NEEDED FOR ANY PURPOSE, NOTIFY LAB WITHIN 5 DAYS.  LOWEST DETECTABLE LIMITS FOR URINE DRUG SCREEN Drug Class                     Cutoff (ng/mL) Amphetamine and metabolites    1000 Barbiturate and metabolites    200 Benzodiazepine                 200 Opiates and metabolites        300 Cocaine and metabolites        300 THC                            50 Performed at [facility], [address], [place], [place] [address]   Urinalysis, Routine w reflex microscopic -Urine, Clean Catch     Status: Abnormal   Collection Time: 11/28/23 11:48 AM  Result Value Ref Range   Color, Urine YELLOW YELLOW   APPearance HAZY (A) CLEAR   Specific Gravity, Urine 1.014 1.005 - 1.030   pH 6.0 5.0 - 8.0   Glucose, UA NEGATIVE NEGATIVE mg/dL   Hgb urine dipstick NEGATIVE NEGATIVE   Bilirubin Urine NEGATIVE NEGATIVE   Ketones, ur NEGATIVE NEGATIVE mg/dL   Protein, ur NEGATIVE NEGATIVE mg/dL   Nitrite POSITIVE (A) NEGATIVE   Leukocytes,Ua NEGATIVE NEGATIVE   RBC / HPF 0-5 0 - 5 RBC/hpf   WBC, UA 0-5 0 - 5 WBC/hpf   Bacteria, UA MANY (A) NONE SEEN   Squamous Epithelial / HPF 0-5 0 - 5 /HPF   Mucus PRESENT     Comment: Performed at [facility], [address], [place], [place] [address]  Protein and glucose, CSF     Status: None   Collection Time: 11/28/23 10:33 PM  Result Value Ref Range   Glucose, CSF 64 40 - 70 mg/dL   Total  Protein, CSF 27 15 - 45 mg/dL    Comment: Performed at [facility], [address], [place], [place] [address]  CSF cell count with differential     Status: Abnormal (Preliminary  result)   Collection Time: 11/28/23 11:02 PM  Result Value Ref Range   Tube # 1    Color, CSF PINK (A) COLORLESS   Appearance, CSF HAZY (A) CLEAR   RBC Count, CSF 2,290 (H) 0 /cu mm   WBC, CSF 1 0 - 5 /cu mm    Comment: Performed at [facility], [address] [place], [place] [address]   Other Cells, CSF PENDING   CSF cell count with differential     Status: Abnormal (Preliminary result)   Collection Time: 11/28/23 11:02 PM  Result Value Ref Range   Tube # 4    Color, CSF COLORLESS COLORLESS   Appearance, CSF CLEAR CLEAR   RBC Count, CSF 43 (H) 0 /cu mm   WBC, CSF 0 0 - 5 /cu mm    Comment: Performed at [facility], [address], [place], [place] [address]   Other Cells, CSF PENDING  MR Cervical Spine W and Wo Contrast  Result Date: 11/28/2023 CLINICAL DATA:  Right arm pain, numbness and weakness EXAM: MRI CERVICAL SPINE WITHOUT AND WITH CONTRAST TECHNIQUE: Multiplanar and multiecho pulse sequences of the cervical spine, to include the craniocervical junction and cervicothoracic junction, were obtained without and with intravenous contrast. CONTRAST:  10mL GADAVIST GADOBUTROL 1 MMOL/ML IV SOLN COMPARISON:  None Available. FINDINGS: Alignment: Physiologic. Vertebrae: No fracture, evidence of discitis, or bone lesion. Cord: There is hyperintense T2-weighted signal within the ventral spinal cord, asymmetric to the right at the C3-6 levels. No abnormal contrast enhancement. Posterior Fossa, vertebral arteries, paraspinal tissues: Negative. Disc levels: C1-2: Unremarkable. C2-3: Normal disc space and facet joints. There is no spinal canal stenosis. No neural foraminal stenosis. C3-4: Intermediate sized disc osteophyte complex. There is no spinal canal stenosis. Mild bilateral neural foraminal stenosis. C4-5: Normal disc space and facet joints. There is no spinal canal stenosis. No neural foraminal stenosis. C5-6: Small disc bulge. There is no spinal  canal stenosis. No neural foraminal stenosis. C6-7: Left asymmetric disc bulge with bilateral uncovertebral hypertrophy. Mild spinal canal stenosis. Severe left neural foraminal stenosis. C7-T1: Normal disc space and facet joints. There is no spinal canal stenosis. No neural foraminal stenosis. IMPRESSION: 1. Hyperintense T2-weighted signal within the ventral spinal cord, asymmetric to the right at the C3-6 levels. This is most consistent with transverse myelitis or other nonspecific demyelinating process. 2. Mild spinal canal stenosis and severe left neural foraminal stenosis at C6-7. Electronically Signed   By: Deatra Robinson M.D.   On: 11/28/2023 21:32   MR BRAIN WO CONTRAST  Result Date: 11/28/2023 CLINICAL DATA:  Neuro deficit, acute, stroke suspected head CT January 10, 2019. EXAM: MRI HEAD WITHOUT CONTRAST TECHNIQUE: Multiplanar, multiecho pulse sequences of the brain and surrounding structures were obtained without intravenous contrast. COMPARISON:  None Available. FINDINGS: Brain: No acute infarction, hemorrhage, hydrocephalus, extra-axial collection or mass lesion. Vascular: Normal flow voids. Incidentally noted hypoplastic vertebrobasilar system. Skull and upper cervical spine: Diffuse decrease of the T1 signal the visualized upper cervical spine and calvarium, may be related to red marrow reconversion. Sinuses/Orbits: Bilateral proptosis.  Paranasal sinuses are clear. Other: None. IMPRESSION: 1. No acute intracranial abnormality. 2. Diffuse decrease of the T1 signal the visualized upper cervical spine and calvarium, may be related to red marrow reconversion. Electronically Signed   By: Baldemar Lenis M.D.   On: 11/28/2023 12:09   DG Shoulder Right  Result Date: 11/27/2023 CLINICAL DATA:  Right shoulder pain and numbness. EXAM: RIGHT SHOULDER - 2+ VIEW COMPARISON:  None Available. FINDINGS: No acute fracture or dislocation. No aggressive osseous lesion. Glenohumeral and  acromioclavicular joints are normal in alignment and exhibit mild-to-moderate degenerative changes. No soft tissue swelling. No radiopaque foreign bodies. IMPRESSION: *No acute osseous abnormality of the right shoulder joint. Mild-to-moderate degenerative joint disease. Electronically Signed   By: Jules Schick M.D.   On: 11/27/2023 12:13    Pending Labs Unresulted Labs (From admission, onward)     Start     Ordered   11/29/23 0500  CBC  Tomorrow morning,   R        11/29/23 0004   11/29/23 0500  Basic metabolic panel  Tomorrow morning,   R        11/29/23 0004   11/29/23 0012  Miscellaneous [facility] test (send-out)  Once,   R       Question:  Test name / description:  Answer:  Anti MOG Ab  11/29/23 0012   11/29/23 0011  Hemoglobin A1c  Once,   R       Comments: To assess prior glycemic control    11/29/23 0010   11/28/23 2239  HTLV 1+2 antibodies, (EIA), bld  Once,   URGENT        11/28/23 2238   11/28/23 2239  SARS Coronavirus 2 by RT PCR (hospital order, performed in [facility] hospital lab) *cepheid single result test* Anterior Nasal Swab  (Tier 2 - SARS Coronavirus 2 by RT PCR (hospital order, performed in [facility] hospital lab) *cepheid single result test*)  Once,   URGENT        11/28/23 2238   11/28/23 2238  HIV Antibody (routine testing w rflx)  (HIV Antibody (Routine testing w reflex) panel)  Once,   URGENT        11/28/23 2237   11/28/23 2238  RPR  Once,   URGENT        11/28/23 2237   11/28/23 2238  Lyme Disease Serology w/Reflex  Once,   URGENT        11/28/23 2237   11/28/23 2238  Varicella zoster antibody, IgM  Once,   URGENT        11/28/23 2237   11/28/23 2238  Varicella zoster antibody, IgG  Once,   URGENT        11/28/23 2237   11/28/23 2237  Vitamin E  Once,   URGENT        11/28/23 2236   11/28/23 2236  Copper, serum  Once,   URGENT        11/28/23 2235   11/28/23 2236  Ceruloplasmin  Once,   URGENT        11/28/23 2235   11/28/23 2236  Vitamin B12   Once,   URGENT        11/28/23 2236   11/28/23 2236  Folate  Once,   URGENT        11/28/23 2236   11/28/23 2236  Methylmalonic acid, serum  Once,   URGENT        11/28/23 2236   11/28/23 2236  Homocysteine  Once,   URGENT        11/28/23 2236   11/28/23 2235  Antiphospholipid syndrome eval, bld  Once,   URGENT        11/28/23 2234   11/28/23 2235  ANCA Titers  (Anti-Neutrophilic Cystoplasmic Antibody Panel (PNL))  Once,   URGENT        11/28/23 2234   11/28/23 2233  Meningitis/Encephalitis Panel (CSF)  Once,   URGENT        11/28/23 2235   11/28/23 2233  Oligoclonal bands, CSF + serum  (Oligoclonal Bands, CSF + Serum panel)  Once,   URGENT        11/28/23 2235   11/28/23 2233  Draw extra clot tube  (Oligoclonal Bands, CSF + Serum panel)  Once,   URGENT        11/28/23 2235   11/28/23 2233  IgG CSF index  (IgG CSF Index, CSF + Serum panel)  Once,   URGENT        11/28/23 2235   11/28/23 2233  Draw extra clot tube  (IgG CSF Index, CSF + Serum panel)  Once,   URGENT        11/28/23 2235   11/28/23 2231  Sjogrens syndrome-A extractable nuclear antibody  (Sjogren's Syndrome Antibods (SSA + SSB) (PNL))  Once,  URGENT        11/28/23 2230   11/28/23 2231  Sjogrens syndrome-B extractable nuclear antibody  (Sjogren's Syndrome Antibods (SSA + SSB) (PNL))  Once,   URGENT        11/28/23 2230   11/28/23 2231  Rheumatoid factor  Once,   URGENT        11/28/23 2230   11/28/23 2227  Neuromyelitis optica autoab, IgG  Once,   URGENT        11/28/23 2226   11/28/23 2227  ANA w/Reflex if Positive  Once,   URGENT        11/28/23 2226   11/28/23 2227  Anti-DNA antibody, double-stranded  Once,   URGENT        11/28/23 2226            Vitals/Pain Today's Vitals   11/28/23 1741 11/28/23 1930 11/28/23 2240 11/28/23 2335  BP: (!) 151/90 (!) 150/108 (!) 150/124   Pulse: 68 82 66   Resp: 18 17 13 14   Temp: 97.9 F (36.6 C) 98.1 F (36.7 C)  97.9 F (36.6 C)  TempSrc: Oral   Oral  SpO2: 100%  99% 99%   Weight:      Height:      PainSc:        Isolation Precautions No active isolations  Medications Medications  morphine (PF) 2 MG/ML injection 2-4 mg (has no administration in time range)  enoxaparin (LOVENOX) injection 40 mg (has no administration in time range)  acetaminophen (TYLENOL) tablet 650 mg (has no administration in time range)    Or  acetaminophen (TYLENOL) suppository 650 mg (has no administration in time range)  ondansetron (ZOFRAN) tablet 4 mg (has no administration in time range)    Or  ondansetron (ZOFRAN) injection 4 mg (has no administration in time range)  insulin aspart (novoLOG) injection 0-20 Units (has no administration in time range)  insulin aspart (novoLOG) injection 0-5 Units (has no administration in time range)  methylPREDNISolone sodium succinate (SOLU-MEDROL) 1,000 mg in sodium chloride 0.9 % 50 mL IVPB (has no administration in time range)  LORazepam (ATIVAN) injection 1 mg (1 mg Intravenous Given 11/28/23 1104)  LORazepam (ATIVAN) injection 1 mg (1 mg Intravenous Given 11/28/23 1940)  gadobutrol (GADAVIST) 1 MMOL/ML injection 10 mL (10 mLs Intravenous Contrast Given 11/28/23 2104)  lidocaine-EPINEPHrine (XYLOCAINE W/EPI) 2 %-1:200000 (PF) injection 20 mL (20 mLs Infiltration Given 11/28/23 2245)    Mobility walks with person assist

## 2023-11-29 NOTE — Progress Notes (Signed)
PROGRESS NOTE    Cheyenne Fowler  JYN:[id number] DOB: 09-06-1966 DOA: 11/28/2023 PCP: Julieanne Manson, MD    Brief Narrative:   Cheyenne Fowler is a 57 y.o. female with past medical history significant for HTN, DM2, bipolar disorder, cocaine use disorder, alcohol use disorder, GERD, history of GI bleed who presented to [facility] ED on 12/6 with complaints of right arm pain, numbness, weakness since 11/27/2023.  Initially presented to [facility] on 12/5 but left without being seen.  Onset of symptoms about 0 830 and 12/5 and continued to be persistent and worsening.  Patient reports last cocaine use roughly 30 days ago.  In the ED, temperature 97.5 F, HR 73, RR 17, BP 119/74, SpO2 93% on room air.  WBC 7.1, hemoglobin 14.5, platelet count 291.  Sodium 139, potassium 4.1, chloride 104, CO2 28, glucose 124, BUN 13, creatinine 0.76.  AST 23, ALT 31, total bilirubin 0.4.  CK1 56.  CRP 0.7.  ESR 4.  INR 0.9.  UDS positive for opiates and cocaine.  MRI brain without contrast with no acute intracranial abnormality, diffuse decrease of T1 signal upper cervical spine and calvarium.  MRI C-spine with and without contrast with hyperintense T2 weighted signal ventral spinal cord asymmetric to the right at C3-6 levels consistent with transverse myelitis or other nonspecific demyelinating process, mild spinal canal stenosis and severe left neural foraminal stenosis C6/7.  Patient underwent lumbar puncture with CSF analysis with 1 WBC total protein 27 glucose 64.  Meningitis/encephalitis panel negative.  Neurology was consulted and patient was transferred to [facility] for further evaluation management of concern of transverse myelitis.  Assessment & Plan:   Transverse myelitis Patient presenting with acute onset right upper extremity weakness, paresthesias since 11/27/2023.  Also complicated by continued cocaine abuse with positive UDS. MRI brain without contrast with no acute  intracranial abnormality, diffuse decrease of T1 signal upper cervical spine and calvarium.  MRI C-spine with and without contrast with hyperintense T2 weighted signal ventral spinal cord asymmetric to the right at C3-6 levels consistent with transverse myelitis or other nonspecific demyelinating process, mild spinal canal stenosis and severe left neural foraminal stenosis C6/7.  Patient underwent lumbar puncture with CSF analysis with 1 WBC total protein 27 glucose 64.  Meningitis/encephalitis panel negative.  -- Neurology following, appreciate assistance -- ANA, double-stranded DNA, SSA/SSB, RF, antiphospholipid antibody, ANCA, HIV, RPR, Lyme disease, VCV, ceruloplasmin, serum copper, HTLV 1 positive sign 2 antibodies, methylmalonic acid, NMO, IgG CSF, oligoclonal bands, MOG ab and AQP4 ab: Pending -- MR T-spine with and without contrast: Pending -- Solu-Medrol 1 g IV every 24 hours x 5 days -- PT/OT evaluation -- Supportive care  Type 2 diabetes mellitus Hemoglobin A1c 6.1.  On metformin outpatient. --Hold oral hypoglycemics while inpatient -- Resistant SSI for coverage given high-dose IV steroids as above -- CBGs qAC/HS  Essential hypertension -- Losartan 50 mg p.o. daily -- Imdur 30 mg p.o. daily  Hyperlipidemia -- Atorvastatin 80 mg p.o. daily  Bipolar 1 disorder -- Lamictal 200 mg p.o. daily -- Seroquel 100 mg p.o. nightly -- Outpatient follow-up with behavioral health  EtOH use disorder Patient reports drinks 40 ounce beer daily -- CIWA protocol with symptom triggered Ativan -- Counseled on need for complete cessation/abstinence --Thiamine, multivitamin, folic acid -- TOC consult for substance abuse resources  Cocaine  use disorder Patient reports last cocaine use roughly 30 days years ago although UDS on admission positive for cocaine. -- Counseled on  need for complete cessation/abstinence -- TOC consult for substance abuse resources  Obesity, class III Body mass index  is 45.73 kg/m.  Complicates all facets of care    DVT prophylaxis: enoxaparin (LOVENOX) injection 40 mg Start: 11/29/23 1000    Code Status: Full Code Family Communication: Updated daughter present at bedside this morning  Disposition Plan:  Level of care: Telemetry Medical Status is: Inpatient Remains inpatient appropriate because: IV steroids, awaiting further recommendations from neurology    Consultants:  Neurology  Procedures:  Lumbar puncture  Antimicrobials:  None   Subjective: Patient seen examined at bedside, resting company.  Lying in bed.  Continues with right upper extremity weakness, paresthesias slightly improved.  Daughter present at bedside and updated.  Remains on IV steroids x 5 days per neurology.  Awaiting multiple send out labs that were done yesterday.  No other specific complaints, concerns or questions at this time.  Denies headache, no dizziness, no chest pain, no palpitations, no fever/chills/night sweats, no nausea/vomiting/diarrhea, no cough/congestion, no abdominal pain, no fatigue.  No acute events overnight per nursing staff.  Objective: Vitals:   11/28/23 2335 11/29/23 0235 11/29/23 0414 11/29/23 0804  BP:  (!) 153/94 (!) 142/78 96/67  Pulse:  72 77 71  Resp: 14  17 17   Temp: 97.9 F (36.6 C) 98.2 F (36.8 C) 98 F (36.7 C) 98.2 F (36.8 C)  TempSrc: Oral Oral Oral Oral  SpO2:  96% 96% 99%  Weight:      Height:        Intake/Output Summary (Last 24 hours) at 11/29/2023 1137 Last data filed at 11/29/2023 9147 Gross per 24 hour  Intake 352.97 ml  Output 400 ml  Net -47.03 ml   Filed Weights   11/28/23 0956  Weight: 113.4 kg    Examination:  Physical Exam: GEN: NAD, alert and oriented x 3, obese, chronically ill in appearance, appears older than stated age HEENT: NCAT, PERRL, EOMI, sclera clear, MMM PULM: CTAB w/o wheezes/crackles, normal respiratory effort, on room air CV: RRR w/o M/G/R GI: abd soft, NTND, NABS, no  R/G/M MSK: no peripheral edema, right upper extremity with muscle strength 4/5, otherwise muscle strength globally intact NEURO: Right upper extremity with slightly decreased sensation to light touch, decreased muscle strength 4/5 otherwise no other focal neurological deficits  PSYCH: normal mood/affect Integumentary: dry/intact, no rashes or wounds    Data Reviewed: I have personally reviewed following labs and imaging studies  CBC: Recent Labs  Lab 11/27/23 1051 11/28/23 1028 11/29/23 0504  WBC 8.4 7.1 8.4  NEUTROABS  --  3.1  --   HGB 13.9 14.5 14.6  HCT 43.6 45.7 45.5  MCV 89.2 91.4 88.0  PLT 293 291 306   Basic Metabolic Panel: Recent Labs  Lab 11/27/23 1051 11/28/23 1028 11/29/23 0504  NA 139 139 136  K 4.0 4.1 4.0  CL 107 104 105  CO2 22 28 22   GLUCOSE 115* 124* 132*  BUN 15 13 11   CREATININE 0.86 0.76 0.82  CALCIUM 9.6 9.4 9.5   GFR: Estimated Creatinine Clearance: 89 mL/min (by C-G formula based on SCr of 0.82 mg/dL). Liver Function Tests: Recent Labs  Lab 11/28/23 1028  AST 23  ALT 31  ALKPHOS 89  BILITOT 0.4  PROT 7.6  ALBUMIN 4.2   No results for input(s): "LIPASE", "AMYLASE" in the last 168 hours. No results for input(s): "AMMONIA" in the last 168 hours. Coagulation Profile: Recent Labs  Lab 11/28/23 1028  INR 0.9  Cardiac Enzymes: Recent Labs  Lab 11/28/23 1048  CKTOTAL 156   BNP (last 3 results) No results for input(s): "PROBNP" in the last 8760 hours. HbA1C: Recent Labs    11/29/23 0504  HGBA1C 6.1*   CBG: Recent Labs  Lab 11/28/23 1022 11/29/23 0058 11/29/23 0629 11/29/23 0822  GLUCAP 122* 154* 155* 187*   Lipid Profile: No results for input(s): "CHOL", "HDL", "LDLCALC", "TRIG", "CHOLHDL", "LDLDIRECT" in the last 72 hours. Thyroid Function Tests: No results for input(s): "TSH", "T4TOTAL", "FREET4", "T3FREE", "THYROIDAB" in the last 72 hours. Anemia Panel: Recent Labs    11/29/23 0504  VITAMINB12 409  FOLATE  12.5   Sepsis Labs: No results for input(s): "PROCALCITON", "LATICACIDVEN" in the last 168 hours.  Recent Results (from the past 240 hour(s))  SARS Coronavirus 2 by RT PCR (hospital order, performed in [facility] hospital lab) *cepheid single result test* Anterior Nasal Swab     Status: None   Collection Time: 11/28/23 10:33 PM   Specimen: Anterior Nasal Swab  Result Value Ref Range Status   SARS Coronavirus 2 by RT PCR NEGATIVE NEGATIVE Final    Comment: (NOTE) SARS-CoV-2 target nucleic acids are NOT DETECTED.  The SARS-CoV-2 RNA is generally detectable in upper and lower respiratory specimens during the acute phase of infection. The lowest concentration of SARS-CoV-2 viral copies this assay can detect is 250 copies / mL. A negative result does not preclude SARS-CoV-2 infection and should not be used as the sole basis for treatment or other patient management decisions.  A negative result may occur with improper specimen collection / handling, submission of specimen other than nasopharyngeal swab, presence of viral mutation(s) within the areas targeted by this assay, and inadequate number of viral copies (<250 copies / mL). A negative result must be combined with clinical observations, patient history, and epidemiological information.  Fact Sheet for Patients:   RoadLapTop.co.za  Fact Sheet for Healthcare Providers: http://kim-miller.com/  This test is not yet approved or  cleared by the [place] FDA and has been authorized for detection and/or diagnosis of SARS-CoV-2 by FDA under an Emergency Use Authorization (EUA).  This EUA will remain in effect (meaning this test can be used) for the duration of the COVID-19 declaration under Section 564(b)(1) of the Act, 21 U.S.C. section 360bbb-3(b)(1), unless the authorization is terminated or revoked sooner.  Performed at [facility], [address] [place], [place] [address]          Radiology Studies: MR BRACHIAL PLEXUS W WO CM RT  Result Date: 11/29/2023 CLINICAL DATA:  Right upper extremity weakness. EXAM: MRI BRACHIAL PLEXUS WITHOUT CONTRAST TECHNIQUE: Multiplanar, multiecho pulse sequences of the neck and surrounding structures were obtained without intravenous contrast. The field of view was focused on the rightbrachial plexus from the neural foramina to the axilla. COMPARISON:  MRI of the cervical spine same date. Cervical spine CT 08/28/2022. FINDINGS: Technical note: Despite efforts by the technologist and patient, mild motion artifact is present on today's exam and could not be eliminated. This reduces exam sensitivity and specificity. Spinal cord As seen on concurrent cervical MRI, there is ventral T2 hyperintensity within the spinal cord asymmetric to the right. No appreciable abnormal cord enhancement. Underlying multilevel spondylosis as detailed on separate cervical spine MRI. Brachial plexus: No mass lesion, fluid collection or abnormal enhancement identified associated with the brachial plexus. Muscles and tendons The shoulder musculature appears bilaterally symmetric, without atrophy, edema or abnormal enhancement. No gross abnormality of the  right rotator cuff on large field-of-view imaging. Bones/Joints No acute or aggressive osseous findings. Mild acromioclavicular degenerative changes bilaterally. Cervical spondylosis as detailed on separate examination. Other findings None IMPRESSION: 1. No mass lesion, fluid collection or abnormal enhancement identified associated with the right brachial plexus. 2. Ventral T2 hyperintensity within the cervical spinal cord asymmetric to the right, better detailed on concurrent cervical spine MRI. See separate report. 3. No acute osseous findings. Electronically Signed   By: Carey Bullocks M.D.   On: 11/29/2023 08:18   MR Cervical Spine W and Wo Contrast  Result Date: 11/28/2023 CLINICAL  DATA:  Right arm pain, numbness and weakness EXAM: MRI CERVICAL SPINE WITHOUT AND WITH CONTRAST TECHNIQUE: Multiplanar and multiecho pulse sequences of the cervical spine, to include the craniocervical junction and cervicothoracic junction, were obtained without and with intravenous contrast. CONTRAST:  10mL GADAVIST GADOBUTROL 1 MMOL/ML IV SOLN COMPARISON:  None Available. FINDINGS: Alignment: Physiologic. Vertebrae: No fracture, evidence of discitis, or bone lesion. Cord: There is hyperintense T2-weighted signal within the ventral spinal cord, asymmetric to the right at the C3-6 levels. No abnormal contrast enhancement. Posterior Fossa, vertebral arteries, paraspinal tissues: Negative. Disc levels: C1-2: Unremarkable. C2-3: Normal disc space and facet joints. There is no spinal canal stenosis. No neural foraminal stenosis. C3-4: Intermediate sized disc osteophyte complex. There is no spinal canal stenosis. Mild bilateral neural foraminal stenosis. C4-5: Normal disc space and facet joints. There is no spinal canal stenosis. No neural foraminal stenosis. C5-6: Small disc bulge. There is no spinal canal stenosis. No neural foraminal stenosis. C6-7: Left asymmetric disc bulge with bilateral uncovertebral hypertrophy. Mild spinal canal stenosis. Severe left neural foraminal stenosis. C7-T1: Normal disc space and facet joints. There is no spinal canal stenosis. No neural foraminal stenosis. IMPRESSION: 1. Hyperintense T2-weighted signal within the ventral spinal cord, asymmetric to the right at the C3-6 levels. This is most consistent with transverse myelitis or other nonspecific demyelinating process. 2. Mild spinal canal stenosis and severe left neural foraminal stenosis at C6-7. Electronically Signed   By: Deatra Robinson M.D.   On: 11/28/2023 21:32   MR BRAIN WO CONTRAST  Result Date: 11/28/2023 CLINICAL DATA:  Neuro deficit, acute, stroke suspected head CT January 10, 2019. EXAM: MRI HEAD WITHOUT CONTRAST  TECHNIQUE: Multiplanar, multiecho pulse sequences of the brain and surrounding structures were obtained without intravenous contrast. COMPARISON:  None Available. FINDINGS: Brain: No acute infarction, hemorrhage, hydrocephalus, extra-axial collection or mass lesion. Vascular: Normal flow voids. Incidentally noted hypoplastic vertebrobasilar system. Skull and upper cervical spine: Diffuse decrease of the T1 signal the visualized upper cervical spine and calvarium, may be related to red marrow reconversion. Sinuses/Orbits: Bilateral proptosis.  Paranasal sinuses are clear. Other: None. IMPRESSION: 1. No acute intracranial abnormality. 2. Diffuse decrease of the T1 signal the visualized upper cervical spine and calvarium, may be related to red marrow reconversion. Electronically Signed   By: Baldemar Lenis M.D.   On: 11/28/2023 12:09        Scheduled Meds:  atorvastatin  80 mg Oral Daily   enoxaparin (LOVENOX) injection  40 mg Subcutaneous Q24H   folic acid  1 mg Oral Daily   insulin aspart  0-20 Units Subcutaneous TID WC   insulin aspart  0-5 Units Subcutaneous QHS   isosorbide mononitrate  30 mg Oral Daily   lamoTRIgine  200 mg Oral Daily   losartan  50 mg Oral Daily   multivitamin with minerals  1 tablet Oral Daily   pantoprazole  40 mg Oral Daily   QUEtiapine  100 mg Oral QHS   [START ON 12/07/2023] thiamine (VITAMIN B1) injection  100 mg Intravenous Daily   Continuous Infusions:  [START ON 11/30/2023] methylPREDNISolone (SOLU-MEDROL) injection     thiamine (VITAMIN B1) injection Stopped (11/29/23 0904)   Followed by   Melene Muller ON 12/01/2023] thiamine (VITAMIN B1) injection       LOS: 0 days    Time spent: 58 minutes spent on chart review, discussion with nursing staff, consultants, updating family and interview/physical exam; more than 50% of that time was spent in counseling and/or coordination of care.    Alvira Philips [place], DO [facility] Available via Epic  secure chat 7am-7pm After these hours, please refer to coverage provider listed on [email or web address] 11/29/2023, 11:37 AM

## 2023-11-30 ENCOUNTER — Inpatient Hospital Stay (HOSPITAL_COMMUNITY): Payer: MEDICAID

## 2023-11-30 DIAGNOSIS — G373 Acute transverse myelitis in demyelinating disease of central nervous system: Secondary | ICD-10-CM | POA: Diagnosis not present

## 2023-11-30 LAB — GLUCOSE, CAPILLARY
Glucose-Capillary: 108 mg/dL — ABNORMAL HIGH (ref 70–99)
Glucose-Capillary: 141 mg/dL — ABNORMAL HIGH (ref 70–99)
Glucose-Capillary: 142 mg/dL — ABNORMAL HIGH (ref 70–99)
Glucose-Capillary: 146 mg/dL — ABNORMAL HIGH (ref 70–99)
Glucose-Capillary: 172 mg/dL — ABNORMAL HIGH (ref 70–99)
Glucose-Capillary: 178 mg/dL — ABNORMAL HIGH (ref 70–99)
Glucose-Capillary: 218 mg/dL — ABNORMAL HIGH (ref 70–99)

## 2023-11-30 LAB — CERULOPLASMIN: Ceruloplasmin: 33.6 mg/dL (ref 19.0–39.0)

## 2023-11-30 LAB — SJOGRENS SYNDROME-A EXTRACTABLE NUCLEAR ANTIBODY: SSA (Ro) (ENA) Antibody, IgG: <0.2 AI (ref 0.0–0.9)

## 2023-11-30 LAB — ANTI-DNA ANTIBODY, DOUBLE-STRANDED: ds DNA Ab: <1 [IU]/mL (ref 0–9)

## 2023-11-30 LAB — RHEUMATOID FACTOR: Rheumatoid fact SerPl-aCnc: 14.4 [IU]/mL — ABNORMAL HIGH (ref <14.0)

## 2023-11-30 LAB — VARICELLA ZOSTER ANTIBODY, IGG: Varicella IgG: REACTIVE

## 2023-11-30 LAB — SJOGRENS SYNDROME-B EXTRACTABLE NUCLEAR ANTIBODY: SSB (La) (ENA) Antibody, IgG: <0.2 AI (ref 0.0–0.9)

## 2023-11-30 MED ORDER — GADOBUTROL 1 MMOL/ML IV SOLN
10.0000 mL | Freq: Once | INTRAVENOUS | Status: AC | PRN
  Administered 2023-11-30: 10 mL via INTRAVENOUS

## 2023-11-30 NOTE — Plan of Care (Signed)
  Problem: Education: Goal: Ability to describe self-care measures that may prevent or decrease complications (Diabetes Survival Skills Education) will improve Outcome: Progressing   Problem: Coping: Goal: Ability to adjust to condition or change in health will improve Outcome: Progressing   Problem: Nutritional: Goal: Maintenance of adequate nutrition will improve Outcome: Progressing   Problem: Skin Integrity: Goal: Risk for impaired skin integrity will decrease Outcome: Progressing   Problem: Activity: Goal: Risk for activity intolerance will decrease Outcome: Progressing   Problem: Pain Management: Goal: General experience of comfort will improve Outcome: Progressing   Problem: Safety: Goal: Ability to remain free from injury will improve Outcome: Progressing

## 2023-11-30 NOTE — Progress Notes (Signed)
Patient Left the ward for MRI accompanied by Transport via bed.

## 2023-11-30 NOTE — Progress Notes (Signed)
NEUROLOGY CONSULT FOLLOW UP NOTE   Date of service: November 30, 2023 [name] MRN:  [id number] DOB:  [birth year]  Brief HPI  Cheyenne Fowler is a 57 y.o. female with history of hypertension, diabetes, bipolar disorder, cocaine abuse, alcohol abuse, GERD and GI bleed who presented with complaints of right sided numbness and weakness.  She initially left the emergency department due to a long wait and presented to urgent care but was then directed to come back to the hospital.  On MRI, she was found to have hyperintense T2 weighted signal in the ventral spinal cord asymmetric to the right at the C3-6 levels.  This was consistent with transverse myelitis.  Lumbar puncture was performed with unremarkable results.  Patient has received 2 doses of high-dose Solu-Medrol out of course of 5.  She states that her symptoms have improved, and she is able to move her right arm more than she was before she came in.   Interval Hx/subjective  Patient reports an improvement in her symptoms after 2 doses of high-dose steroids. Vitals   Vitals:   11/30/23 0018 11/30/23 0339 11/30/23 0818 11/30/23 1209  BP: 122/76 (!) 116/57 129/74 123/67  Pulse: 71 75 66 73  Resp: 18 17 17 18   Temp: 97.8 F (36.6 C) (!) 97.4 F (36.3 C) 98 F (36.7 C) 97.9 F (36.6 C)  TempSrc: Oral Oral Oral Oral  SpO2: 100% 99% 100% 96%  Weight:      Height:         Body mass index is 45.73 kg/m.  Physical Exam   Constitutional: Appears well-developed and well-nourished.  Psych: Affect appropriate to situation.  Eyes: No scleral injection.  HENT: No OP obstrucion.  Head: Normocephalic.  Respiratory: Effort normal, non-labored breathing.  Skin: WDI.   Neurologic Examination    NEURO:  Mental Status: Alert and oriented to person place time and situation, able to give clear and coherent history of present illness Speech/Language: speech is clear and fluent  Cranial Nerves:  II: PERRL.  III, IV, VI:  EOMI. Eyelids elevate symmetrically.  V: Sensation is intact to light touch and symmetrical to face.  VII: Smile is symmetrical.  VIII: hearing intact to voice. IX, X: Phonation is normal.  WU:[id number] shrug stronger on the left than on the right XII: tongue is midline without fasciculations. Motor: 5/5 strength to left upper extremity, proximal and distal, 4 -/5 right shoulder abduction, 3/5 right elbow flexion and extension, 5/5 wrist flexion and extension and finger spread.  5 out of 5 strength to left lower extremity proximal and distal, 4 out of 5 right hip flexion, 4 out of 5 knee flexion and knee extension, 5 out of 5 dorsiflexion and plantarflexion Tone: is normal and bulk is normal Sensation- Intact to light touch bilaterally but diminished in the right arm Coordination: FTN intact on the left, unable to perform on the right DTRs: 2+ throughout, more brisk on the right than the left Gait- deferred   Medications  Current Facility-Administered Medications:    acetaminophen (TYLENOL) tablet 650 mg, 650 mg, Oral, Q6H PRN, 650 mg at 11/29/23 2225 **OR** acetaminophen (TYLENOL) suppository 650 mg, 650 mg, Rectal, Q6H PRN, Julian Reil, Jared M, DO   atorvastatin (LIPITOR) tablet 80 mg, 80 mg, Oral, Daily, Julian Reil, Jared M, DO, 80 mg at 11/30/23 0820   enoxaparin (LOVENOX) injection 40 mg, 40 mg, Subcutaneous, Q24H, Julian Reil, Jared M, DO, 40 mg at 11/30/23 0820   folic acid (FOLVITE) tablet  1 mg, 1 mg, Oral, Daily, Lyda Perone M, DO, 1 mg at 11/30/23 0820   insulin aspart (novoLOG) injection 0-20 Units, 0-20 Units, Subcutaneous, TID WC, Hillary Bow, DO, 7 Units at 11/30/23 1216   insulin aspart (novoLOG) injection 0-5 Units, 0-5 Units, Subcutaneous, QHS, Gardner, Jared M, DO   isosorbide mononitrate (IMDUR) 24 hr tablet 30 mg, 30 mg, Oral, Daily, Julian Reil, Jared M, DO, 30 mg at 11/30/23 0820   lamoTRIgine (LAMICTAL) tablet 200 mg, 200 mg, Oral, Daily, Julian Reil, Jared M, DO, 200 mg at  11/30/23 0820   LORazepam (ATIVAN) tablet 1-4 mg, 1-4 mg, Oral, Q1H PRN **OR** LORazepam (ATIVAN) injection 1-4 mg, 1-4 mg, Intravenous, Q1H PRN, Julian Reil, Jared M, DO   losartan (COZAAR) tablet 50 mg, 50 mg, Oral, Daily, Julian Reil, Jared M, DO, 50 mg at 11/30/23 6440   methylPREDNISolone sodium succinate (SOLU-MEDROL) 1,000 mg in sodium chloride 0.9 % 50 mL IVPB, 1,000 mg, Intravenous, Q24H, Bhagat, Srishti L, MD, Stopped at 11/30/23 0700   multivitamin with minerals tablet 1 tablet, 1 tablet, Oral, Daily, Julian Reil, Jared M, DO, 1 tablet at 11/30/23 0820   ondansetron (ZOFRAN) tablet 4 mg, 4 mg, Oral, Q6H PRN **OR** ondansetron (ZOFRAN) injection 4 mg, 4 mg, Intravenous, Q6H PRN, Julian Reil, Jared M, DO   oxyCODONE (Oxy IR/ROXICODONE) immediate release tablet 5 mg, 5 mg, Oral, Q4H PRN, [place], Alvira Philips, DO, 5 mg at 11/29/23 2355   pantoprazole (PROTONIX) EC tablet 40 mg, 40 mg, Oral, Daily, Lyda Perone M, DO, 40 mg at 11/30/23 0820   QUEtiapine (SEROQUEL) tablet 100 mg, 100 mg, Oral, QHS, Gardner, Jared M, DO, 100 mg at 11/29/23 2212   thiamine (VITAMIN B1) 500 mg in sodium chloride 0.9 % 50 mL IVPB, 500 mg, Intravenous, Q8H, Stopped at 11/30/23 1413 **FOLLOWED BY** [START ON 12/01/2023] thiamine (VITAMIN B1) 250 mg in sodium chloride 0.9 % 50 mL IVPB, 250 mg, Intravenous, Daily **FOLLOWED BY** [START ON 12/07/2023] thiamine (VITAMIN B1) injection 100 mg, 100 mg, Intravenous, Daily, Erick Blinks, MD [facility]   CBC:  Recent Labs  Lab 11/28/23 1028 11/29/23 0504  WBC 7.1 8.4  NEUTROABS 3.1  --   HGB 14.5 14.6  HCT 45.7 45.5  MCV 91.4 88.0  PLT 291 306    Basic Metabolic Panel:  Lab Results  Component Value Date   NA 136 11/29/2023   K 4.0 11/29/2023   CO2 22 11/29/2023   GLUCOSE 132 (H) 11/29/2023   BUN 11 11/29/2023   CREATININE 0.82 11/29/2023   CALCIUM 9.5 11/29/2023   GFRNONAA >60 11/29/2023   GFRAA 86 04/27/2020   Lipid Panel:  Lab Results  Component  Value Date   LDLCALC 73 05/20/2023   HgbA1c:  Lab Results  Component Value Date   HGBA1C 6.1 (H) 11/29/2023   Urine Drug Screen:     Component Value Date/Time   LABOPIA POSITIVE (A) 11/28/2023 1148   COCAINSCRNUR POSITIVE (A) 11/28/2023 1148   LABBENZ NONE DETECTED 11/28/2023 1148   AMPHETMU NONE DETECTED 11/28/2023 1148   THCU NONE DETECTED 11/28/2023 1148   LABBARB NONE DETECTED 11/28/2023 1148    Alcohol Level     Component Value Date/Time   ETH <10 01/15/2022 1550   INR  Lab Results  Component Value Date   INR 0.9 11/28/2023   APTT  Lab Results  Component Value Date   APTT 27 11/28/2023  Anti-MOG antibodies pending Homocystine pending Folate 12.5 Vitamin B12 409 Antiphospholipid syndrome evaluation pending ANA  pending Varicella IgG reactive Varicella IgM pending Vitamin D pending Sjogren's syndrome a antibody less than 0.2 Sjogren's syndrome B antibody less than 0.2 RPR nonreactive Rheumatoid factor pending Neuromyelitis optica autoantibody pending Methylmalonic acid pending Lyme disease serology pending HLTV 1 and 2 antibodies pending HIV nonreactive Copper pending Ceruloplasmin pending ANCA pending Anti-double-stranded DNA antibody negative CRP 0.7 ESR 4 CK 156  CSF IgG index pending CSF oligoclonal bands pending Meningitis/encephalitis panel negative CSF glucose 64 CSF protein 27 CSF cell count tube 1 2290 red cells, 1 white cell CSF cell count tube 443 red cells 0 white cells  UDS positive for opiates and cocaine  MRI Brain(Personally reviewed): No acute abnormality  MRI cervical and thoracic spine: Hyperintense T2 weighted signal within ventral spinal cord asymmetric to the right at C3-C6 levels consistent with transverse myelitis, no lesion seen in thoracic spine.  MRI brachial plexus:  Pending   Assessment   [place] CHANA WARBRITTON is a 58 y.o. female with history of GERD, GI bleed, hypertension, diabetes, cocaine abuse and alcohol  abuse who presented with right upper arm weakness numbness and pain.  MRI of the cervical and thoracic spine demonstrated hyperintense T2 weighted signal consistent with transverse myelitis.  She has been started on high-dose Solu-Medrol and verbalizes improvement after 2 doses.  Lumbar puncture was performed, and no concerning abnormalities in CSF were detected.  Awaiting MRI of the brachial plexus as well as multiple autoimmune labs.  Neurology will continue to follow along, and it would be worthwhile to obtain PT/OT evaluation as well given some persistent weakness.  Recommendations  -Continue IV Solu-Medrol 1000 mg daily for 3 more doses -Follow-up on pending labs -Continue CIWA protocol -Continue high-dose thiamine -PT/OT evaluation -Neurology will continue to follow ______________________________________________________________________  Patient seen by NP and then discussed with Dr. Iver Nestle.  Signed, Tamarick Kovalcik E Ernestina [place], NP [facility]

## 2023-11-30 NOTE — Progress Notes (Addendum)
PROGRESS NOTE    Cheyenne Fowler  WUJ:[id number] DOB: 03-Oct-1966 DOA: 11/28/2023 PCP: Julieanne Manson, MD    Brief Narrative:   Cheyenne Fowler is a 57 y.o. female with past medical history significant for HTN, DM2, bipolar disorder, cocaine use disorder, alcohol use disorder, GERD, history of GI bleed who presented to [facility] ED on 12/6 with complaints of right arm pain, numbness, weakness since 11/27/2023.  Initially presented to [facility] on 12/5 but left without being seen.  Onset of symptoms about 0 830 and 12/5 and continued to be persistent and worsening.  Patient reports last cocaine use roughly 30 days ago.  In the ED, temperature 97.5 F, HR 73, RR 17, BP 119/74, SpO2 93% on room air.  WBC 7.1, hemoglobin 14.5, platelet count 291.  Sodium 139, potassium 4.1, chloride 104, CO2 28, glucose 124, BUN 13, creatinine 0.76.  AST 23, ALT 31, total bilirubin 0.4.  CK1 56.  CRP 0.7.  ESR 4.  INR 0.9.  UDS positive for opiates and cocaine.  MRI brain without contrast with no acute intracranial abnormality, diffuse decrease of T1 signal upper cervical spine and calvarium.  MRI C-spine with and without contrast with hyperintense T2 weighted signal ventral spinal cord asymmetric to the right at C3-6 levels consistent with transverse myelitis or other nonspecific demyelinating process, mild spinal canal stenosis and severe left neural foraminal stenosis C6/7.  Patient underwent lumbar puncture with CSF analysis with 1 WBC total protein 27 glucose 64.  Meningitis/encephalitis panel negative.  Neurology was consulted and patient was transferred to [facility] for further evaluation management of concern of transverse myelitis.  Assessment & Plan:   Transverse myelitis Patient presenting with acute onset right upper extremity weakness, paresthesias since 11/27/2023.  Also complicated by continued cocaine abuse with positive UDS. MRI brain without contrast with no acute  intracranial abnormality, diffuse decrease of T1 signal upper cervical spine and calvarium.  MRI C-spine with and without contrast with hyperintense T2 weighted signal ventral spinal cord asymmetric to the right at C3-6 levels consistent with transverse myelitis or other nonspecific demyelinating process, mild spinal canal stenosis and severe left neural foraminal stenosis C6/7.  MR T-spine with no acute findings. Patient underwent lumbar puncture with CSF analysis with 1 WBC total protein 27 glucose 64.  Meningitis/encephalitis panel negative.  -- Neurology following, appreciate assistance -- HIV/RPR: nonreactive -- ANA, double-stranded DNA, SSA/SSB, RF, antiphospholipid antibody, ANCA,Lyme disease, VCV, ceruloplasmin, serum copper, HTLV 1 positive sign 2 antibodies, methylmalonic acid, NMO, IgG CSF, oligoclonal bands, MOG ab: Pending -- MR T-spine with and without contrast: Pending -- Solu-Medrol 1 g IV every 24 hours x 5 days (Day #3/5) -- PT/OT evaluation -- Supportive care  Type 2 diabetes mellitus Hemoglobin A1c 6.1.  On metformin outpatient. -- Hold oral hypoglycemics while inpatient -- Resistant SSI for coverage given high-dose IV steroids as above -- CBGs qAC/HS  Essential hypertension -- Losartan 50 mg p.o. daily -- Imdur 30 mg p.o. daily  Hyperlipidemia -- Atorvastatin 80 mg p.o. daily  Bipolar 1 disorder -- Lamictal 200 mg p.o. daily -- Seroquel 100 mg p.o. nightly -- Outpatient follow-up with behavioral health  EtOH use disorder Patient reports drinks 40 ounce beer daily -- CIWA protocol with symptom triggered Ativan -- Counseled on need for complete cessation/abstinence --Thiamine, multivitamin, folic acid -- TOC consult for substance abuse resources  Cocaine  use disorder Patient reports last cocaine use roughly 30 days years ago although UDS on admission  positive for cocaine. -- Counseled on need for complete cessation/abstinence -- TOC consult for substance abuse  resources  Obesity, class III Body mass index is 45.73 kg/m.  Complicates all facets of care    DVT prophylaxis: enoxaparin (LOVENOX) injection 40 mg Start: 11/29/23 1000    Code Status: Full Code Family Communication: No family present at bedside this morning, updated daughter extensively yesterday afternoon  Disposition Plan:  Level of care: Telemetry Medical Status is: Inpatient Remains inpatient appropriate because: IV steroids, awaiting further recommendations from neurology    Consultants:  Neurology  Procedures:  Lumbar puncture  Antimicrobials:  None   Subjective: Patient seen examined at bedside, lying in bed watching TV.  Continues with right upper extremity weakness, paresthesias; reports gradual improvement. Remains on IV steroids x 5 days per neurology.  Awaiting results of multiple send out labs.  No other specific complaints, concerns or questions at this time.  Denies headache, no dizziness, no chest pain, no palpitations, no fever/chills/night sweats, no nausea/vomiting/diarrhea, no cough/congestion, no abdominal pain, no fatigue.  No acute events overnight per nursing staff.  Objective: Vitals:   11/29/23 2041 11/30/23 0018 11/30/23 0339 11/30/23 0818  BP: (!) 141/73 122/76 (!) 116/57 129/74  Pulse: 89 71 75 66  Resp: 18 18 17 17   Temp: (!) 97.5 F (36.4 C) 97.8 F (36.6 C) (!) 97.4 F (36.3 C) 98 F (36.7 C)  TempSrc: Oral Oral Oral Oral  SpO2: 100% 100% 99% 100%  Weight:      Height:        Intake/Output Summary (Last 24 hours) at 11/30/2023 1024 Last data filed at 11/30/2023 1610 Gross per 24 hour  Intake 356.28 ml  Output --  Net 356.28 ml   Filed Weights   11/28/23 0956  Weight: 113.4 kg    Examination:  Physical Exam: GEN: NAD, alert and oriented x 3, obese, chronically ill in appearance, appears older than stated age HEENT: NCAT, PERRL, EOMI, sclera clear, MMM PULM: CTAB w/o wheezes/crackles, normal respiratory effort, on room  air CV: RRR w/o M/G/R GI: abd soft, NTND, NABS, no R/G/M MSK: no peripheral edema, right upper extremity with muscle strength 4/5, otherwise muscle strength globally intact NEURO: Right upper extremity with slightly decreased sensation to light touch, decreased muscle strength 4/5 otherwise no other focal neurological deficits  PSYCH: normal mood/affect Integumentary: dry/intact, no rashes or wounds    Data Reviewed: I have personally reviewed following labs and imaging studies  CBC: Recent Labs  Lab 11/27/23 1051 11/28/23 1028 11/29/23 0504  WBC 8.4 7.1 8.4  NEUTROABS  --  3.1  --   HGB 13.9 14.5 14.6  HCT 43.6 45.7 45.5  MCV 89.2 91.4 88.0  PLT 293 291 306   Basic Metabolic Panel: Recent Labs  Lab 11/27/23 1051 11/28/23 1028 11/29/23 0504  NA 139 139 136  K 4.0 4.1 4.0  CL 107 104 105  CO2 22 28 22   GLUCOSE 115* 124* 132*  BUN 15 13 11   CREATININE 0.86 0.76 0.82  CALCIUM 9.6 9.4 9.5   GFR: Estimated Creatinine Clearance: 89 mL/min (by C-G formula based on SCr of 0.82 mg/dL). Liver Function Tests: Recent Labs  Lab 11/28/23 1028  AST 23  ALT 31  ALKPHOS 89  BILITOT 0.4  PROT 7.6  ALBUMIN 4.2   No results for input(s): "LIPASE", "AMYLASE" in the last 168 hours. No results for input(s): "AMMONIA" in the last 168 hours. Coagulation Profile: Recent Labs  Lab 11/28/23 1028  INR 0.9   Cardiac Enzymes: Recent Labs  Lab 11/28/23 1048  CKTOTAL 156   BNP (last 3 results) No results for input(s): "PROBNP" in the last 8760 hours. HbA1C: Recent Labs    11/29/23 0504  HGBA1C 6.1*   CBG: Recent Labs  Lab 11/29/23 1747 11/29/23 2037 11/30/23 0014 11/30/23 0348 11/30/23 0820  GLUCAP 147* 164* 172* 141* 146*   Lipid Profile: No results for input(s): "CHOL", "HDL", "LDLCALC", "TRIG", "CHOLHDL", "LDLDIRECT" in the last 72 hours. Thyroid Function Tests: No results for input(s): "TSH", "T4TOTAL", "FREET4", "T3FREE", "THYROIDAB" in the last 72  hours. Anemia Panel: Recent Labs    11/29/23 0504  VITAMINB12 409  FOLATE 12.5   Sepsis Labs: No results for input(s): "PROCALCITON", "LATICACIDVEN" in the last 168 hours.  Recent Results (from the past 240 hour(s))  SARS Coronavirus 2 by RT PCR (hospital order, performed in [facility] hospital lab) *cepheid single result test* Anterior Nasal Swab     Status: None   Collection Time: 11/28/23 10:33 PM   Specimen: Anterior Nasal Swab  Result Value Ref Range Status   SARS Coronavirus 2 by RT PCR NEGATIVE NEGATIVE Final    Comment: (NOTE) SARS-CoV-2 target nucleic acids are NOT DETECTED.  The SARS-CoV-2 RNA is generally detectable in upper and lower respiratory specimens during the acute phase of infection. The lowest concentration of SARS-CoV-2 viral copies this assay can detect is 250 copies / mL. A negative result does not preclude SARS-CoV-2 infection and should not be used as the sole basis for treatment or other patient management decisions.  A negative result may occur with improper specimen collection / handling, submission of specimen other than nasopharyngeal swab, presence of viral mutation(s) within the areas targeted by this assay, and inadequate number of viral copies (<250 copies / mL). A negative result must be combined with clinical observations, patient history, and epidemiological information.  Fact Sheet for Patients:   RoadLapTop.co.za  Fact Sheet for Healthcare Providers: http://kim-miller.com/  This test is not yet approved or  cleared by the [place] FDA and has been authorized for detection and/or diagnosis of SARS-CoV-2 by FDA under an Emergency Use Authorization (EUA).  This EUA will remain in effect (meaning this test can be used) for the duration of the COVID-19 declaration under Section 564(b)(1) of the Act, 21 U.S.C. section 360bbb-3(b)(1), unless the authorization is terminated or revoked  sooner.  Performed at [facility], [address] [place], [place] [address]          Radiology Studies: MR THORACIC SPINE W WO CONTRAST  Result Date: 11/30/2023 CLINICAL DATA:  Acute thoracic myelopathy EXAM: MRI THORACIC WITHOUT AND WITH CONTRAST TECHNIQUE: Multiplanar and multiecho pulse sequences of the thoracic spine were obtained without and with intravenous contrast. CONTRAST:  10mL GADAVIST GADOBUTROL 1 MMOL/ML IV SOLN COMPARISON:  None Available. FINDINGS: Alignment:  Physiologic. Vertebrae: No fracture, evidence of discitis, or bone lesion. Cord:  Normal signal and morphology. Paraspinal and other soft tissues: Negative. Disc levels: No spinal canal stenosis. No abnormal contrast enhancement. IMPRESSION: Normal MRI of the thoracic spine. Electronically Signed   By: Deatra Robinson M.D.   On: 11/30/2023 02:30   MR BRACHIAL PLEXUS W WO CM RT  Result Date: 11/29/2023 CLINICAL DATA:  Right upper extremity weakness. EXAM: MRI BRACHIAL PLEXUS WITHOUT CONTRAST TECHNIQUE: Multiplanar, multiecho pulse sequences of the neck and surrounding structures were obtained without intravenous contrast. The field of view was focused on the rightbrachial plexus from the neural  foramina to the axilla. COMPARISON:  MRI of the cervical spine same date. Cervical spine CT 08/28/2022. FINDINGS: Technical note: Despite efforts by the technologist and patient, mild motion artifact is present on today's exam and could not be eliminated. This reduces exam sensitivity and specificity. Spinal cord As seen on concurrent cervical MRI, there is ventral T2 hyperintensity within the spinal cord asymmetric to the right. No appreciable abnormal cord enhancement. Underlying multilevel spondylosis as detailed on separate cervical spine MRI. Brachial plexus: No mass lesion, fluid collection or abnormal enhancement identified associated with the brachial plexus. Muscles and tendons The shoulder musculature  appears bilaterally symmetric, without atrophy, edema or abnormal enhancement. No gross abnormality of the right rotator cuff on large field-of-view imaging. Bones/Joints No acute or aggressive osseous findings. Mild acromioclavicular degenerative changes bilaterally. Cervical spondylosis as detailed on separate examination. Other findings None IMPRESSION: 1. No mass lesion, fluid collection or abnormal enhancement identified associated with the right brachial plexus. 2. Ventral T2 hyperintensity within the cervical spinal cord asymmetric to the right, better detailed on concurrent cervical spine MRI. See separate report. 3. No acute osseous findings. Electronically Signed   By: Carey Bullocks M.D.   On: 11/29/2023 08:18   MR Cervical Spine W and Wo Contrast  Result Date: 11/28/2023 CLINICAL DATA:  Right arm pain, numbness and weakness EXAM: MRI CERVICAL SPINE WITHOUT AND WITH CONTRAST TECHNIQUE: Multiplanar and multiecho pulse sequences of the cervical spine, to include the craniocervical junction and cervicothoracic junction, were obtained without and with intravenous contrast. CONTRAST:  10mL GADAVIST GADOBUTROL 1 MMOL/ML IV SOLN COMPARISON:  None Available. FINDINGS: Alignment: Physiologic. Vertebrae: No fracture, evidence of discitis, or bone lesion. Cord: There is hyperintense T2-weighted signal within the ventral spinal cord, asymmetric to the right at the C3-6 levels. No abnormal contrast enhancement. Posterior Fossa, vertebral arteries, paraspinal tissues: Negative. Disc levels: C1-2: Unremarkable. C2-3: Normal disc space and facet joints. There is no spinal canal stenosis. No neural foraminal stenosis. C3-4: Intermediate sized disc osteophyte complex. There is no spinal canal stenosis. Mild bilateral neural foraminal stenosis. C4-5: Normal disc space and facet joints. There is no spinal canal stenosis. No neural foraminal stenosis. C5-6: Small disc bulge. There is no spinal canal stenosis. No neural  foraminal stenosis. C6-7: Left asymmetric disc bulge with bilateral uncovertebral hypertrophy. Mild spinal canal stenosis. Severe left neural foraminal stenosis. C7-T1: Normal disc space and facet joints. There is no spinal canal stenosis. No neural foraminal stenosis. IMPRESSION: 1. Hyperintense T2-weighted signal within the ventral spinal cord, asymmetric to the right at the C3-6 levels. This is most consistent with transverse myelitis or other nonspecific demyelinating process. 2. Mild spinal canal stenosis and severe left neural foraminal stenosis at C6-7. Electronically Signed   By: Deatra Robinson M.D.   On: 11/28/2023 21:32   MR BRAIN WO CONTRAST  Result Date: 11/28/2023 CLINICAL DATA:  Neuro deficit, acute, stroke suspected head CT January 10, 2019. EXAM: MRI HEAD WITHOUT CONTRAST TECHNIQUE: Multiplanar, multiecho pulse sequences of the brain and surrounding structures were obtained without intravenous contrast. COMPARISON:  None Available. FINDINGS: Brain: No acute infarction, hemorrhage, hydrocephalus, extra-axial collection or mass lesion. Vascular: Normal flow voids. Incidentally noted hypoplastic vertebrobasilar system. Skull and upper cervical spine: Diffuse decrease of the T1 signal the visualized upper cervical spine and calvarium, may be related to red marrow reconversion. Sinuses/Orbits: Bilateral proptosis.  Paranasal sinuses are clear. Other: None. IMPRESSION: 1. No acute intracranial abnormality. 2. Diffuse decrease of the T1 signal the visualized upper cervical  spine and calvarium, may be related to red marrow reconversion. Electronically Signed   By: Baldemar Lenis M.D.   On: 11/28/2023 12:09        Scheduled Meds:  atorvastatin  80 mg Oral Daily   enoxaparin (LOVENOX) injection  40 mg Subcutaneous Q24H   folic acid  1 mg Oral Daily   insulin aspart  0-20 Units Subcutaneous TID WC   insulin aspart  0-5 Units Subcutaneous QHS   isosorbide mononitrate  30 mg Oral  Daily   lamoTRIgine  200 mg Oral Daily   losartan  50 mg Oral Daily   multivitamin with minerals  1 tablet Oral Daily   pantoprazole  40 mg Oral Daily   QUEtiapine  100 mg Oral QHS   [START ON 12/07/2023] thiamine (VITAMIN B1) injection  100 mg Intravenous Daily   Continuous Infusions:  methylPREDNISolone (SOLU-MEDROL) injection 20 mL/hr at 11/30/23 0553   thiamine (VITAMIN B1) injection 500 mg (11/30/23 0603)   Followed by   Melene Muller ON 12/01/2023] thiamine (VITAMIN B1) injection       LOS: 1 day    Time spent: 58 minutes spent on chart review, discussion with nursing staff, consultants, updating family and interview/physical exam; more than 50% of that time was spent in counseling and/or coordination of care.    Alvira Philips [place], DO [facility] Available via Epic secure chat 7am-7pm After these hours, please refer to coverage provider listed on [email or web address] 11/30/2023, 10:24 AM

## 2023-11-30 NOTE — Plan of Care (Signed)
VSS. RA. Respirations are even and unlabored. Patient ambulated hallway with steady gait. Good PO intake. No c/o CP or SOB. NAD. Safety precautions maintained. Call bell within reach. Bed in lowest position and locked.   Problem: Nutritional: Goal: Maintenance of adequate nutrition will improve Outcome: Progressing   Problem: Skin Integrity: Goal: Risk for impaired skin integrity will decrease Outcome: Progressing   Problem: Pain Management: Goal: General experience of comfort will improve Outcome: Progressing   Problem: Safety: Goal: Ability to remain free from injury will improve Outcome: Progressing

## 2023-12-01 DIAGNOSIS — G373 Acute transverse myelitis in demyelinating disease of central nervous system: Secondary | ICD-10-CM | POA: Diagnosis not present

## 2023-12-01 LAB — IGG CSF INDEX
Albumin CSF-mCnc: 14 mg/dL (ref 8–37)
Albumin: 3.9 g/dL (ref 3.8–4.9)
CSF IgG Index: 0.6 (ref 0.0–0.7)
IgG (Immunoglobin G), Serum: 732 mg/dL (ref 586–1602)
IgG, CSF: 1.5 mg/dL (ref 0.0–6.7)
IgG/Alb Ratio, CSF: 0.11 (ref 0.00–0.25)

## 2023-12-01 LAB — ANTIPHOSPHOLIPID SYNDROME EVAL, BLD
Anticardiolipin IgA: <9 [APL'U]/mL (ref 0–11)
Anticardiolipin IgG: <9 [GPL'U]/mL (ref 0–14)
Anticardiolipin IgM: <9 [MPL'U]/mL (ref 0–12)
DRVVT: 29.8 s (ref 0.0–47.0)
PTT Lupus Anticoagulant: 31.6 s (ref 0.0–43.5)
Phosphatydalserine, IgA: <1 {APS'U} (ref 0–19)
Phosphatydalserine, IgG: 9 U (ref 0–30)
Phosphatydalserine, IgM: <10 U (ref 0–30)

## 2023-12-01 LAB — COPPER, SERUM: Copper: 135 ug/dL (ref 80–158)

## 2023-12-01 LAB — MISC LABCORP TEST (SEND OUT): Labcorp test code: 505310

## 2023-12-01 LAB — GLUCOSE, CAPILLARY
Glucose-Capillary: 117 mg/dL — ABNORMAL HIGH (ref 70–99)
Glucose-Capillary: 129 mg/dL — ABNORMAL HIGH (ref 70–99)
Glucose-Capillary: 137 mg/dL — ABNORMAL HIGH (ref 70–99)
Glucose-Capillary: 151 mg/dL — ABNORMAL HIGH (ref 70–99)
Glucose-Capillary: 204 mg/dL — ABNORMAL HIGH (ref 70–99)
Glucose-Capillary: 227 mg/dL — ABNORMAL HIGH (ref 70–99)

## 2023-12-01 LAB — ANCA TITERS
Atypical P-ANCA titer: <1:20 {titer}
C-ANCA: <1:20 {titer}
P-ANCA: <1:20 {titer}

## 2023-12-01 LAB — LYME DISEASE SEROLOGY W/REFLEX: Lyme Total Antibody EIA: NEGATIVE

## 2023-12-01 LAB — NEUROMYELITIS OPTICA AUTOAB, IGG: NMO-IgG: <1.5 U/mL (ref 0.0–3.0)

## 2023-12-01 LAB — METHYLMALONIC ACID, SERUM: Methylmalonic Acid, Quantitative: 87 nmol/L (ref 0–378)

## 2023-12-01 LAB — VARICELLA ZOSTER ANTIBODY, IGM: Varicella-Zoster Ab, IgM: <0.91 {index} (ref 0.00–0.90)

## 2023-12-01 LAB — HOMOCYSTEINE: Homocysteine: 9.3 umol/L (ref 0.0–14.5)

## 2023-12-01 NOTE — Progress Notes (Signed)
PROGRESS NOTE    [name]  ZOX:[id number] DOB: 08/17/1966 DOA: 11/28/2023 PCP: Julieanne Manson, MD    Brief Narrative:   Cheyenne Fowler is a 57 y.o. female with past medical history significant for HTN, DM2, bipolar disorder, cocaine use disorder, alcohol use disorder, GERD, history of GI bleed who presented to [facility] ED on 12/6 with complaints of right arm pain, numbness, weakness since 11/27/2023.  Initially presented to [facility] on 12/5 but left without being seen.  Onset of symptoms about 0 830 and 12/5 and continued to be persistent and worsening.  Patient reports last cocaine use roughly 30 days ago.  In the ED, temperature 97.5 F, HR 73, RR 17, BP 119/74, SpO2 93% on room air.  WBC 7.1, hemoglobin 14.5, platelet count 291.  Sodium 139, potassium 4.1, chloride 104, CO2 28, glucose 124, BUN 13, creatinine 0.76.  AST 23, ALT 31, total bilirubin 0.4.  CK1 56.  CRP 0.7.  ESR 4.  INR 0.9.  UDS positive for opiates and cocaine.  MRI brain without contrast with no acute intracranial abnormality, diffuse decrease of T1 signal upper cervical spine and calvarium.  MRI C-spine with and without contrast with hyperintense T2 weighted signal ventral spinal cord asymmetric to the right at C3-6 levels consistent with transverse myelitis or other nonspecific demyelinating process, mild spinal canal stenosis and severe left neural foraminal stenosis C6/7.  Patient underwent lumbar puncture with CSF analysis with 1 WBC total protein 27 glucose 64.  Meningitis/encephalitis panel negative.  Neurology was consulted and patient was transferred to [facility] for further evaluation management of concern of transverse myelitis.  Assessment & Plan:   Transverse myelitis Patient presenting with acute onset right upper extremity weakness, paresthesias since 11/27/2023.  Also complicated by continued cocaine abuse with positive UDS. MRI brain without contrast with no acute  intracranial abnormality, diffuse decrease of T1 signal upper cervical spine and calvarium.  MRI C-spine with and without contrast with hyperintense T2 weighted signal ventral spinal cord asymmetric to the right at C3-6 levels consistent with transverse myelitis or other nonspecific demyelinating process, mild spinal canal stenosis and severe left neural foraminal stenosis C6/7.  MR T-spine with no acute findings. Patient underwent lumbar puncture with CSF analysis with 1 WBC total protein 27 glucose 64.  Meningitis/encephalitis panel negative.  -- Neurology following, appreciate assistance -- HIV/RPR: nonreactive -- SSA/SSB negative -- Rheumatoid factor 14.4 (slightly high w/ ULN 14.0) -- Ceruloplasmin within normal limits -- Double-stranded DNA antibodies normal -- ANA,antiphospholipid antibody, ANCA,Lyme disease, VCV, serum copper, HTLV 1 positive sign 2 antibodies, methylmalonic acid, NMO, IgG CSF, oligoclonal bands, MOG ab: Pending -- Solu-Medrol 1 g IV every 24 hours x 5 days (Day #4/5) -- PT/OT evaluation: pending -- Supportive care  Type 2 diabetes mellitus Hemoglobin A1c 6.1.  On metformin outpatient. -- Hold oral hypoglycemics while inpatient -- Resistant SSI for coverage given high-dose IV steroids as above -- CBGs qAC/HS  Essential hypertension -- Losartan 50 mg p.o. daily -- Imdur 30 mg p.o. daily  Hyperlipidemia -- Atorvastatin 80 mg p.o. daily  Bipolar 1 disorder -- Lamictal 200 mg p.o. daily -- Seroquel 100 mg p.o. nightly -- Outpatient follow-up with behavioral health  EtOH use disorder Patient reports drinks 40 ounce beer daily -- CIWA protocol with symptom triggered Ativan -- Counseled on need for complete cessation/abstinence -- Thiamine, multivitamin, folic acid -- TOC consult for substance abuse resources  Cocaine  use disorder Patient reports last cocaine  use roughly 30 days years ago although UDS on admission positive for cocaine. -- Counseled on need  for complete cessation/abstinence -- TOC consult for substance abuse resources  Obesity, class III Body mass index is 45.73 kg/m.  Complicates all facets of care    DVT prophylaxis: enoxaparin (LOVENOX) injection 40 mg Start: 11/29/23 1000    Code Status: Full Code Family Communication: No family present at bedside this morning  Disposition Plan:  Level of care: Telemetry Medical Status is: Inpatient Remains inpatient appropriate because: IV steroids, awaiting further recommendations from neurology; likely discharge home tomorrow with home health    Consultants:  Neurology  Procedures:  Lumbar puncture  Antimicrobials:  None   Subjective: Patient seen examined at bedside, sitting at edge of bed.  Continues with right upper extremity weakness, paresthesias; reports gradual improvement.  RN present at bedside. Remains on IV steroids x 5 days per neurology; day #4 of 5.   Awaiting results of multiple send out labs.  No other specific complaints, concerns or questions at this time.  Denies headache, no dizziness, no chest pain, no palpitations, no fever/chills/night sweats, no nausea/vomiting/diarrhea, no cough/congestion, no abdominal pain, no fatigue.  No acute events overnight per nursing staff.  Objective: Vitals:   11/30/23 2345 12/01/23 0432 12/01/23 0819 12/01/23 1214  BP: 112/66 130/75 122/67 126/73  Pulse: 68 72 68 78  Resp: 17 18 14 16   Temp: (!) 97.5 F (36.4 C) 97.7 F (36.5 C) 98.3 F (36.8 C) 98.2 F (36.8 C)  TempSrc: Oral Oral Oral Oral  SpO2: 100% 100% 99% 100%  Weight:      Height:        Intake/Output Summary (Last 24 hours) at 12/01/2023 1237 Last data filed at 12/01/2023 0800 Gross per 24 hour  Intake 800 ml  Output --  Net 800 ml   Filed Weights   11/28/23 0956  Weight: 113.4 kg    Examination:  Physical Exam: GEN: NAD, alert and oriented x 3, obese, chronically ill in appearance, appears older than stated age HEENT: NCAT, PERRL, EOMI,  sclera clear, MMM PULM: CTAB w/o wheezes/crackles, normal respiratory effort, on room air CV: RRR w/o M/G/R GI: abd soft, NTND, NABS, no R/G/M MSK: no peripheral edema, right upper extremity with muscle strength 4/5, otherwise muscle strength globally intact NEURO: Right upper extremity with slightly decreased sensation to light touch, decreased muscle strength 4/5 otherwise no other focal neurological deficits  PSYCH: normal mood/affect Integumentary: dry/intact, no rashes or wounds    Data Reviewed: I have personally reviewed following labs and imaging studies  CBC: Recent Labs  Lab 11/27/23 1051 11/28/23 1028 11/29/23 0504  WBC 8.4 7.1 8.4  NEUTROABS  --  3.1  --   HGB 13.9 14.5 14.6  HCT 43.6 45.7 45.5  MCV 89.2 91.4 88.0  PLT 293 291 306   Basic Metabolic Panel: Recent Labs  Lab 11/27/23 1051 11/28/23 1028 11/29/23 0504  NA 139 139 136  K 4.0 4.1 4.0  CL 107 104 105  CO2 22 28 22   GLUCOSE 115* 124* 132*  BUN 15 13 11   CREATININE 0.86 0.76 0.82  CALCIUM 9.6 9.4 9.5   GFR: Estimated Creatinine Clearance: 89 mL/min (by C-G formula based on SCr of 0.82 mg/dL). Liver Function Tests: Recent Labs  Lab 11/28/23 1028  AST 23  ALT 31  ALKPHOS 89  BILITOT 0.4  PROT 7.6  ALBUMIN 4.2   No results for input(s): "LIPASE", "AMYLASE" in the last 168 hours.  No results for input(s): "AMMONIA" in the last 168 hours. Coagulation Profile: Recent Labs  Lab 11/28/23 1028  INR 0.9   Cardiac Enzymes: Recent Labs  Lab 11/28/23 1048  CKTOTAL 156   BNP (last 3 results) No results for input(s): "PROBNP" in the last 8760 hours. HbA1C: Recent Labs    11/29/23 0504  HGBA1C 6.1*   CBG: Recent Labs  Lab 11/30/23 2007 11/30/23 2348 12/01/23 0427 12/01/23 0820 12/01/23 1211  GLUCAP 108* 142* 151* 129* 227*   Lipid Profile: No results for input(s): "CHOL", "HDL", "LDLCALC", "TRIG", "CHOLHDL", "LDLDIRECT" in the last 72 hours. Thyroid Function Tests: No results  for input(s): "TSH", "T4TOTAL", "FREET4", "T3FREE", "THYROIDAB" in the last 72 hours. Anemia Panel: Recent Labs    11/29/23 0504  VITAMINB12 409  FOLATE 12.5   Sepsis Labs: No results for input(s): "PROCALCITON", "LATICACIDVEN" in the last 168 hours.  Recent Results (from the past 240 hour(s))  SARS Coronavirus 2 by RT PCR (hospital order, performed in [facility] hospital lab) *cepheid single result test* Anterior Nasal Swab     Status: None   Collection Time: 11/28/23 10:33 PM   Specimen: Anterior Nasal Swab  Result Value Ref Range Status   SARS Coronavirus 2 by RT PCR NEGATIVE NEGATIVE Final    Comment: (NOTE) SARS-CoV-2 target nucleic acids are NOT DETECTED.  The SARS-CoV-2 RNA is generally detectable in upper and lower respiratory specimens during the acute phase of infection. The lowest concentration of SARS-CoV-2 viral copies this assay can detect is 250 copies / mL. A negative result does not preclude SARS-CoV-2 infection and should not be used as the sole basis for treatment or other patient management decisions.  A negative result may occur with improper specimen collection / handling, submission of specimen other than nasopharyngeal swab, presence of viral mutation(s) within the areas targeted by this assay, and inadequate number of viral copies (<250 copies / mL). A negative result must be combined with clinical observations, patient history, and epidemiological information.  Fact Sheet for Patients:   RoadLapTop.co.za  Fact Sheet for Healthcare Providers: http://kim-miller.com/  This test is not yet approved or  cleared by the [place] FDA and has been authorized for detection and/or diagnosis of SARS-CoV-2 by FDA under an Emergency Use Authorization (EUA).  This EUA will remain in effect (meaning this test can be used) for the duration of the COVID-19 declaration under Section 564(b)(1) of the Act, 21  U.S.C. section 360bbb-3(b)(1), unless the authorization is terminated or revoked sooner.  Performed at [facility], [address], [place], [place] [address]          Radiology Studies: MR THORACIC SPINE W WO CONTRAST  Result Date: 11/30/2023 CLINICAL DATA:  Acute thoracic myelopathy EXAM: MRI THORACIC WITHOUT AND WITH CONTRAST TECHNIQUE: Multiplanar and multiecho pulse sequences of the thoracic spine were obtained without and with intravenous contrast. CONTRAST:  10mL GADAVIST GADOBUTROL 1 MMOL/ML IV SOLN COMPARISON:  None Available. FINDINGS: Alignment:  Physiologic. Vertebrae: No fracture, evidence of discitis, or bone lesion. Cord:  Normal signal and morphology. Paraspinal and other soft tissues: Negative. Disc levels: No spinal canal stenosis. No abnormal contrast enhancement. IMPRESSION: Normal MRI of the thoracic spine. Electronically Signed   By: Deatra Robinson M.D.   On: 11/30/2023 02:30        Scheduled Meds:  atorvastatin  80 mg Oral Daily   enoxaparin (LOVENOX) injection  40 mg Subcutaneous Q24H   folic acid  1 mg Oral Daily  insulin aspart  0-20 Units Subcutaneous TID WC   insulin aspart  0-5 Units Subcutaneous QHS   isosorbide mononitrate  30 mg Oral Daily   lamoTRIgine  200 mg Oral Daily   losartan  50 mg Oral Daily   multivitamin with minerals  1 tablet Oral Daily   pantoprazole  40 mg Oral Daily   QUEtiapine  100 mg Oral QHS   [START ON 12/07/2023] thiamine (VITAMIN B1) injection  100 mg Intravenous Daily   Continuous Infusions:  methylPREDNISolone (SOLU-MEDROL) injection 1,000 mg (12/01/23 0431)   thiamine (VITAMIN B1) injection 250 mg (12/01/23 0859)     LOS: 2 days    Time spent: 58 minutes spent on chart review, discussion with nursing staff, consultants, updating family and interview/physical exam; more than 50% of that time was spent in counseling and/or coordination of care.    Alvira Philips [place], DO [facility] Available via Epic secure chat 7am-7pm After these hours, please refer to coverage provider listed on [email or web address] 12/01/2023, 12:37 PM

## 2023-12-01 NOTE — TOC Initial Note (Signed)
Transition of Care [facility]) - Initial/Assessment Note    Patient Details  Name: Cheyenne Fowler MRN: [id number] Date of Birth: [birth year]  Transition [facility]) CM/SW Contact:    Kermit Balo, RN Phone Number: 12/01/2023, 2:36 PM  Clinical Narrative:                  Pt is from home with her spouse, brother, daughter and grandchildren.  Pt denies transportation issues and denies home medication issues.  Outpatient therapy arranged through [facility]. Information on the AVS. Pt will call to schedule the first appointment. Pt has transportation home.  Expected Discharge Plan: OP Rehab Barriers to Discharge: Continued Medical Work up   Patient Goals and CMS Choice     Choice offered to / list presented to : Patient      Expected Discharge Plan and Services   Discharge Planning Services: CM Consult   Living arrangements for the past 2 months: Single Family Home                                      Prior Living Arrangements/Services Living arrangements for the past 2 months: Single Family Home Lives with:: Spouse, Adult Children Patient language and need for interpreter reviewed:: Yes Do you feel safe going back to the place where you live?: Yes        Care giver support system in place?: Yes (comment)   Criminal Activity/Legal Involvement Pertinent to Current Situation/Hospitalization: No - Comment as needed  Activities of Daily Living   ADL Screening (condition at time of admission) Independently performs ADLs?: Yes (appropriate for developmental age) Is the patient deaf or have difficulty hearing?: No Does the patient have difficulty seeing, even when wearing glasses/contacts?: No Does the patient have difficulty concentrating, remembering, or making decisions?: No  Permission Sought/Granted                  Emotional Assessment Appearance:: Appears stated age Attitude/Demeanor/Rapport: Screaming Affect (typically observed):  Accepting Orientation: : Oriented to Self, Oriented to Place, Oriented to  Time, Oriented to Situation   Psych Involvement: No (comment)  Admission diagnosis:  Acute transverse myelitis (HCC) [G37.3] Patient Active Problem List   Diagnosis Date Noted   Acute transverse myelitis (HCC) 11/29/2023   HTN (hypertension) 11/29/2023   Right lumbar radiculopathy 06/02/2023   Plantar fasciitis of right foot 06/02/2023   Primary hypertension 01/23/2022   Acute esophagitis    Gastritis and gastroduodenitis    Hematemesis 01/16/2022   Type 2 diabetes mellitus (HCC) 01/15/2022   Hypertension associated with diabetes (HCC) 01/15/2022   Alcohol use 01/15/2022   Hyperlipidemia associated with type 2 diabetes mellitus (HCC) 01/15/2022   Extravasation of intravenous contrast medium 01/15/2022   Cocaine use disorder, mild, abuse (HCC) 06/15/2020   Bipolar I disorder, most recent episode depressed (HCC) 06/14/2020   Menopausal syndrome 05/30/2020   Bipolar disorder (HCC)    Prediabetes    Tooth infection 08/27/2018   Hemoptysis 08/27/2018   Blood in stool 07/23/2018   Chest pain of uncertain etiology 07/13/2012   Depression 07/13/2012   Tobacco abuse 07/13/2012   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    Class: Acute   Polysubstance (excluding opioids) dependence (HCC) 05/22/2012    Class: Acute   Breast tenderness in female 03/18/2012   Health care maintenance 03/18/2012   DEPRESSION 08/17/2007   PCP:  Delrae Alfred,  Lanora Manis, MD Pharmacy:   [facility] [address], [place] - [address] [place] [address] Phone: [phone] Fax: [phone]  [facility] [place], [place] - [address] [place] [address] Phone: [phone] Fax: [phone]  [facility] [address] [place]), [place] - [address] [place] [address] Phone: [phone] Fax: [phone]     Social Determinants of Health (SDOH) Social History: SDOH Screenings   Food Insecurity: No Food Insecurity (11/29/2023)  Housing: Low Risk  (11/29/2023)  Transportation Needs: No Transportation Needs (11/29/2023)  Utilities: Not At Risk (11/29/2023)  Depression (PHQ2-9): High Risk (10/07/2023)  Financial Resource Strain: Low Risk  (01/23/2022)  Tobacco Use: High Risk (11/28/2023)   SDOH Interventions:     Readmission Risk Interventions     No data to display

## 2023-12-01 NOTE — Progress Notes (Signed)
NEUROLOGY CONSULT FOLLOW UP NOTE   Date of service: December 01, 2023 [name] MRN:  [id number] DOB:  01/14/1966  Brief HPI  Cheyenne Fowler Cheyenne Fowler is a 57 y.o. 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 LP without pleocytosis and with normal protein. IgG index is pending.   Interval Hx/subjective  She reports improvement in her right arm   Vitals   Vitals:   11/30/23 2011 11/30/23 2345 12/01/23 0432 12/01/23 0819  BP: (!) 141/77 112/66 130/75 122/67  Pulse: 61 68 72 68  Resp: 18 17 18 14   Temp: 97.7 F (36.5 C) (!) 97.5 F (36.4 C) 97.7 F (36.5 C) 98.3 F (36.8 C)  TempSrc: Oral Oral Oral Oral  SpO2: 100% 100% 100% 99%  Weight:      Height:         Body mass index is 45.73 kg/m.  Physical Exam   General: in bed, nad  Neurologic Examination     NEURO:  Mental Status: Alert and oriented to person place time and situation, able to give clear and coherent history of present illness Speech/Language: speech is clear and fluent   Cranial Nerves:  II: PERRL.  III, IV, VI: EOMI. Eyelids elevate symmetrically.  V: Sensation is intact to light touch and symmetrical to face.  VII: Smile is symmetrical.  VIII: hearing intact to voice. IX, X: Phonation is normal.  XII: tongue is midline without fasciculations. Motor: 5/5 strength to left upper extremity, proximal and distal, 4 -/5 right shoulder abduction, 3/5 right elbow flexion and 4+/5 elbow extension, 5/5 wrist flexion and extension and finger spread.  5 out of 5 strength to left lower extremity proximal and distal, 4 out of 5 right hip flexion, 4 out of 5 knee flexion and knee extension, 5 out of 5 dorsiflexion and plantarflexion Tone: is normal and bulk is normal Sensation- Intact to light touch bilaterally but diminished in the right arm Coordination: FTN intact on the left, unable to perform on the  right DTRs: 2+ throughout, more brisk on the right than the left Gait- deferred    Labs and Diagnostic Imaging   Anti-MOG antibodies pending Homocystine pending Folate 12.5 Vitamin B12 409 Antiphospholipid syndrome evaluation pending ANA pending Varicella IgG reactive Varicella IgM pending Vitamin E pending Sjogren's syndrome a antibody less than 0.2 Sjogren's syndrome B antibody less than 0.2 RPR nonreactive Rheumatoid factor pending Neuromyelitis optica autoantibody pending Methylmalonic acid pending Lyme disease serology pending HLTV 1 and 2 antibodies pending HIV nonreactive Copper pending Ceruloplasmin nml at 33.6 ANCA pending Anti-double-stranded DNA antibody negative CRP 0.7 ESR 4 CK 156   CSF IgG index pending CSF oligoclonal bands pending Meningitis/encephalitis panel negative CSF glucose 64 CSF protein 27 CSF cell count tube 1 2290 red cells, 1 white cell CSF cell count tube 443 red cells 0 white cells   UDS positive for opiates and cocaine  Impression   Cheyenne Fowler is a 57 y.o. female presenting with a nonenhancing T2 lesion with relatively rapid onset.  Her CSF is fairly reassuring that this is not an infectious process.  Though it is not enhancing, her response to steroids would argue that this does have at least some degree of inflammation involved.  I would favor continuing to treat as transverse myelitis, though she may need repeat imaging down the line.  Recommendations  Continue Solu-Medrol for total  of 5 days, today is day three Follow-up labs PT/OT  ______________________________________________________________________   Thank you for the opportunity to take part in the care of this patient. If you have any further questions, please contact the neurology consultation team on call. Updated oncall schedule is listed on AMION.  Signed,  Cheyenne Slot, MD [facility] [phone]  If 7pm- 7am, please page neurology  on call as listed in AMION.

## 2023-12-01 NOTE — Evaluation (Signed)
Occupational Therapy Evaluation Patient Details Name: Cheyenne Fowler MRN: [id number] DOB: [birth year] Today's Date: 12/01/2023   History of Present Illness Pt is a 57 y/o F presenting to ED on 12/6 with RUE pain/numbness/weakness, workup concerning for acute tranverse myelitis. MRI c sp ine with hyperintenseT2 weighted signal ventral spinal cord asymmetric to right at C3-4 levels consisten with transverse myelitis or other nonspecific demyelinating process. Found to have T2 lesion of spinal cord, LP withotu pleocytosis and normal protein. PMH includes DM2, BPD, HTN, cocaine abuse   Clinical Impression   Pt reports ind at baseline with ADLs/functional mobility, lives with family who can assist at d/c. Pt currently limited by RUE deficits, needs supervision- min A for ADLs, and supervision for transfers without AD. Pt motivated to use RUE for functional tasks, provided with squeeze ball for strengthening. Pt presenting with impairments listed below, will follow acutely. Recommend OP OT at d/c.        If plan is discharge home, recommend the following: A little help with walking and/or transfers;A little help with bathing/dressing/bathroom;Two people to help with bathing/dressing/bathroom;Direct supervision/assist for medications management;Assist for transportation;Help with stairs or ramp for entrance    Functional Status Assessment  Patient has had a recent decline in their functional status and demonstrates the ability to make significant improvements in function in a reasonable and predictable amount of time.  Equipment Recommendations  None recommended by OT    Recommendations for Other Services PT consult     Precautions / Restrictions Precautions Precautions: Fall Restrictions Weight Bearing Restrictions: No      Mobility Bed Mobility               General bed mobility comments: seated EOB upon arrival    Transfers Overall transfer level: Needs assistance Equipment  used:  (IV pole) Transfers: Sit to/from Stand Sit to Stand: Supervision                  Balance Overall balance assessment: Mild deficits observed, not formally tested                                         ADL either performed or assessed with clinical judgement   ADL Overall ADL's : Needs assistance/impaired Eating/Feeding: Supervision/ safety   Grooming: Supervision/safety   Upper Body Bathing: Minimal assistance   Lower Body Bathing: Minimal assistance   Upper Body Dressing : Minimal assistance   Lower Body Dressing: Minimal assistance   Toilet Transfer: Supervision/safety   Toileting- Clothing Manipulation and Hygiene: Supervision/safety       Functional mobility during ADLs: Supervision/safety       Vision   Vision Assessment?: No apparent visual deficits     Perception Perception: Not tested       Praxis Praxis: Not tested       Pertinent Vitals/Pain Pain Assessment Pain Assessment: No/denies pain     Extremity/Trunk Assessment Upper Extremity Assessment Upper Extremity Assessment: Right hand dominant;RUE deficits/detail RUE Deficits / Details: ~ 30* shoulder elevation, elbow ROM WFL but needs incr time. wrist and hand ROM WFL RUE Sensation: decreased light touch;decreased proprioception RUE Coordination: decreased fine motor;decreased gross motor   Lower Extremity Assessment Lower Extremity Assessment: Defer to PT evaluation   Cervical / Trunk Assessment Cervical / Trunk Assessment: Normal   Communication Communication Communication: No apparent difficulties   Cognition Arousal: Alert Behavior During Therapy: [facility] for  tasks assessed/performed Overall Cognitive Status: Within Functional Limits for tasks assessed                                       General Comments  VSS    Exercises     Shoulder Instructions      Home Living Family/patient expects to be discharged to:: Private  residence Living Arrangements: Children;Spouse/significant other;Other relatives (brother, grandchildren) Available Help at Discharge: Family;Available PRN/intermittently Type of Home: House Home Access: Stairs to enter Entrance Stairs-Number of Steps: 1   Home Layout: Two level Alternate Level Stairs-Number of Steps: flight   Bathroom Shower/Tub: Tub/shower unit         Home Equipment: None          Prior Functioning/Environment Prior Level of Function : Independent/Modified Independent;Driving             Mobility Comments: no AD use ADLs Comments: ind, manages own meds        OT Problem List: Decreased strength;Decreased range of motion;Decreased activity tolerance;Impaired balance (sitting and/or standing);Impaired UE functional use;Impaired sensation;Decreased coordination      OT Treatment/Interventions: Self-care/ADL training;Therapeutic exercise;Energy conservation;DME and/or AE instruction;Therapeutic activities;Patient/family education;Balance training    OT Goals(Current goals can be found in the care plan section) Acute Rehab OT Goals Patient Stated Goal: none stated OT Goal Formulation: With patient Time For Goal Achievement: 12/15/23 Potential to Achieve Goals: Good ADL Goals Pt Will Perform Tub/Shower Transfer: Tub transfer;Shower transfer;Independently;ambulating Pt/caregiver will Perform Home Exercise Program: With written HEP provided;With Supervision;Right Upper extremity;Increased strength;Increased ROM  OT Frequency: Min 1X/week    Co-evaluation              AM-PAC OT "6 Clicks" Daily Activity     Outcome Measure Help from another person eating meals?: A Little Help from another person taking care of personal grooming?: A Little Help from another person toileting, which includes using toliet, bedpan, or urinal?: A Little Help from another person bathing (including washing, rinsing, drying)?: A Little Help from another person to put on  and taking off regular upper body clothing?: A Little Help from another person to put on and taking off regular lower body clothing?: A Little 6 Click Score: 18   End of Session Nurse Communication: Mobility status  Activity Tolerance: Patient tolerated treatment well Patient left: in bed;with call bell/phone within reach (EOB)  OT Visit Diagnosis: Unsteadiness on feet (R26.81);Other abnormalities of gait and mobility (R26.89);Muscle weakness (generalized) (M62.81);Other symptoms and signs involving the nervous system (R29.898)                Time: 4034-7425 OT Time Calculation (min): 31 min Charges:  OT General Charges $OT Visit: 1 Visit OT Evaluation $OT Eval Moderate Complexity: 1 Mod OT Treatments $Self Care/Home Management : 8-22 mins  Carver Fila, OTD, OTR/L SecureChat Preferred Acute Rehab [phone]  Desa Rech K Koonce 12/01/2023, 11:12 AM

## 2023-12-01 NOTE — Plan of Care (Signed)
  Problem: Fluid Volume: Goal: Ability to maintain a balanced intake and output will improve Outcome: Progressing   Problem: Metabolic: Goal: Ability to maintain appropriate glucose levels will improve Outcome: Progressing   Problem: Education: Goal: Knowledge of General Education information will improve Description: Including pain rating scale, medication(s)/side effects and non-pharmacologic comfort measures Outcome: Progressing

## 2023-12-01 NOTE — TOC CAGE-AID Note (Addendum)
Transition of Care [facility]) - CAGE-AID Screening   Patient Details  Name: Cheyenne Fowler MRN: [id number] Date of Birth: 02-Feb-1966  Transition of [facility]) CM/SW Contact:    Kermit Balo, RN Phone Number: 12/01/2023, 2:39 PM   Clinical Narrative:  Pt denied the need for substance abuse counseling resources. She states she is already in counseling.  CAGE-AID Screening:    Have You Ever Felt You Ought to Cut Down on Your Drinking or Drug Use?: No Have People Annoyed You By Critizing Your Drinking Or Drug Use?: No Have You Felt Bad Or Guilty About Your Drinking Or Drug Use?: No Have You Ever Had a Drink or Used Drugs First Thing In The Morning to Steady Your Nerves or to Get Rid of a Hangover?: No CAGE-AID Score: 0  Substance Abuse Education Offered: Yes (refused)

## 2023-12-02 ENCOUNTER — Telehealth: Payer: Self-pay

## 2023-12-02 DIAGNOSIS — G373 Acute transverse myelitis in demyelinating disease of central nervous system: Secondary | ICD-10-CM | POA: Diagnosis not present

## 2023-12-02 LAB — GLUCOSE, CAPILLARY
Glucose-Capillary: 124 mg/dL — ABNORMAL HIGH (ref 70–99)
Glucose-Capillary: 132 mg/dL — ABNORMAL HIGH (ref 70–99)
Glucose-Capillary: 107 mg/dL — ABNORMAL HIGH (ref 70–99)
Glucose-Capillary: 198 mg/dL — ABNORMAL HIGH (ref 70–99)
Glucose-Capillary: 190 mg/dL — ABNORMAL HIGH (ref 70–99)
Glucose-Capillary: 204 mg/dL — ABNORMAL HIGH (ref 70–99)

## 2023-12-02 LAB — ANA W/REFLEX IF POSITIVE: Anti Nuclear Antibody (ANA): NEGATIVE

## 2023-12-02 LAB — HTLV I+II ANTIBODIES, (EIA), BLD: HTLV I/II Ab: NEGATIVE

## 2023-12-02 NOTE — Progress Notes (Signed)
Physical Therapy Treatment and Discharge Patient Details Name: SHUNDRA BRENT MRN: [id number] DOB: [birth year] Today's Date: 12/02/2023   History of Present Illness 57 y.o. female presented 11/28/23 with new onset R arm pain, numbness, and weakness. MRI C-spine concerning for acute transverse myelitis C3-6. MRI brain negative.  PMH significant of DM2, BPD, HTN.  Cocaine abuse    PT Comments  Patient doing better today. Progressed to independent ambulation and supervision for up/down steps without a rail (as she does to enter home). Flight of steps deferred as pt does not have to do a flight to get to bedroom. The flight of steps is to downstairs garage/laundry room and she does not plan to walk down there initially (husband and others will do laundry). Goals met and no further needs identified.     If plan is discharge home, recommend the following: Assistance with cooking/housework;Assist for transportation;Help with stairs or ramp for entrance   Can travel by private vehicle        Equipment Recommendations  None recommended by PT    Recommendations for Other Services       Precautions / Restrictions Precautions Precautions: Fall Restrictions Weight Bearing Restrictions: No     Mobility  Bed Mobility Overal bed mobility: Independent             General bed mobility comments: seated EOB upon arrival    Transfers Overall transfer level: Independent Equipment used: None Transfers: Sit to/from Stand Sit to Stand: Independent           General transfer comment: no safety issues    Ambulation/Gait Ambulation/Gait assistance: Supervision Gait Distance (Feet): 200 Feet Assistive device: None Gait Pattern/deviations: Step-through pattern, Decreased stride length   Gait velocity interpretation: 1.31 - 2.62 ft/sec, indicative of limited community ambulator   General Gait Details: much more fluid gait pattern; slightly shortened step length bil   Stairs Stairs:  Yes Stairs assistance: Contact guard assist, Supervision Stair Management: No rails, Step to pattern, Forwards Number of Stairs: 2 (x 4 reps) General stair comments: vc for sequencing due to RLE weakness; pt able to return demonstrate proper sequencing   Wheelchair Mobility     Tilt Bed    Modified Rankin (Stroke Patients Only)       Balance Overall balance assessment: Independent                                          Cognition Arousal: Alert Behavior During Therapy: WFL for tasks assessed/performed Overall Cognitive Status: Within Functional Limits for tasks assessed                                          Exercises      General Comments        Pertinent Vitals/Pain Pain Assessment Pain Assessment: No/denies pain    Home Living               Alternate Level Stairs-Number of Steps: flight Home Layout: Two level;Laundry or work area in basement        Prior Function            PT Goals (current goals can now be found in the care plan section) Acute Rehab PT Goals Patient Stated Goal: be able to get back to normal  Time For Goal Achievement: 12/15/23 Potential to Achieve Goals: Good Progress towards PT goals: Goals met/education completed, patient discharged from PT    Frequency    Min 1X/week      PT Plan      Co-evaluation              AM-PAC PT "6 Clicks" Mobility   Outcome Measure  Help needed turning from your back to your side while in a flat bed without using bedrails?: None Help needed moving from lying on your back to sitting on the side of a flat bed without using bedrails?: None Help needed moving to and from a bed to a chair (including a wheelchair)?: None Help needed standing up from a chair using your arms (e.g., wheelchair or bedside chair)?: None Help needed to walk in hospital room?: None Help needed climbing 3-5 steps with a railing? : None 6 Click Score: 24    End of  Session   Activity Tolerance: Patient tolerated treatment well Patient left: with call bell/phone within reach;in bed Nurse Communication: Mobility status;Other (comment) (ok to be up alone) PT Visit Diagnosis: Unsteadiness on feet (R26.81);Other abnormalities of gait and mobility (R26.89);Muscle weakness (generalized) (M62.81)   PT Discharge Note  Patient is being discharged from PT services secondary to:  Goals met and no further therapy needs identified.  Please see latest Therapy Progress Note for current level of functioning and progress toward goals.  Progress and discharge plan and discussed with patient/caregiver and they  Agree    Time: 8416-6063 PT Time Calculation (min) (ACUTE ONLY): 10 min  Charges:    $Gait Training: 8-22 mins PT General Charges $$ ACUTE PT VISIT: 1 Visit                      Jerolyn Center, PT Acute Rehabilitation Services  Office [phone]    Zena Amos 12/02/2023, 11:18 AM

## 2023-12-02 NOTE — Progress Notes (Signed)
PROGRESS NOTE    Cheyenne Fowler  VOZ:[id number] DOB: 02-Dec-1966 DOA: 11/28/2023 PCP: Julieanne Manson, MD    Brief Narrative:   Cheyenne Fowler is a 57 y.o. female with past medical history significant for HTN, DM2, bipolar disorder, cocaine use disorder, alcohol use disorder, GERD, history of GI bleed who presented to [facility] ED on 12/6 with complaints of right arm pain, numbness, weakness since 11/27/2023.  Initially presented to [facility] on 12/5 but left without being seen.  Onset of symptoms about 0 830 and 12/5 and continued to be persistent and worsening.  Patient reports last cocaine use roughly 30 days ago.  In the ED, temperature 97.5 F, HR 73, RR 17, BP 119/74, SpO2 93% on room air.  WBC 7.1, hemoglobin 14.5, platelet count 291.  Sodium 139, potassium 4.1, chloride 104, CO2 28, glucose 124, BUN 13, creatinine 0.76.  AST 23, ALT 31, total bilirubin 0.4.  CK1 56.  CRP 0.7.  ESR 4.  INR 0.9.  UDS positive for opiates and cocaine.  MRI brain without contrast with no acute intracranial abnormality, diffuse decrease of T1 signal upper cervical spine and calvarium.  MRI C-spine with and without contrast with hyperintense T2 weighted signal ventral spinal cord asymmetric to the right at C3-6 levels consistent with transverse myelitis or other nonspecific demyelinating process, mild spinal canal stenosis and severe left neural foraminal stenosis C6/7.  Patient underwent lumbar puncture with CSF analysis with 1 WBC total protein 27 glucose 64.  Meningitis/encephalitis panel negative.  Neurology was consulted and patient was transferred to [facility] for further evaluation management of concern of transverse myelitis.  Assessment & Plan:   Transverse myelitis Patient presenting with acute onset right upper extremity weakness, paresthesias since 11/27/2023.  Also complicated by continued cocaine abuse with positive UDS. MRI brain without contrast with no acute  intracranial abnormality, diffuse decrease of T1 signal upper cervical spine and calvarium.  MRI C-spine with and without contrast with hyperintense T2 weighted signal ventral spinal cord asymmetric to the right at C3-6 levels consistent with transverse myelitis or other nonspecific demyelinating process, mild spinal canal stenosis and severe left neural foraminal stenosis C6/7.  MR T-spine with no acute findings. Patient underwent lumbar puncture with CSF analysis with 1 WBC total protein 27 glucose 64.  Meningitis/encephalitis panel negative.  -- Neurology following, appreciate assistance -- HIV/RPR: nonreactive -- SSA/SSB negative -- Rheumatoid factor 14.4 (slightly high w/ ULN 14.0) -- Ceruloplasmin within normal limits -- Double-stranded DNA antibodies normal -- Homocystine 9.3, within normal limits -- Antiphospholipid antibodies negative -- ANA,  -- ANCA negative -- Lyme disease negative  -- VCV IgM negative -- serum copper 135 wnL -- methylmalonic acid wnL,  -- NMO-IgG nmL  -- MOG ab: negative -- Solu-Medrol 1 g IV every 24 hours x 5 days (Day #4/5) -- PT/OT evaluation: Recommend outpatient therapy -- Supportive care  Type 2 diabetes mellitus Hemoglobin A1c 6.1.  On metformin outpatient. -- Hold oral hypoglycemics while inpatient -- Resistant SSI for coverage given high-dose IV steroids as above -- CBGs qAC/HS  Essential hypertension -- Losartan 50 mg p.o. daily -- Imdur 30 mg p.o. daily  Hyperlipidemia -- Atorvastatin 80 mg p.o. daily  Bipolar 1 disorder -- Lamictal 200 mg p.o. daily -- Seroquel 100 mg p.o. nightly -- Outpatient follow-up with behavioral health  EtOH use disorder Patient reports drinks 40 ounce beer daily -- CIWA protocol with symptom triggered Ativan -- Counseled on need for complete cessation/abstinence --  Thiamine, multivitamin, folic acid -- TOC consult for substance abuse resources  Cocaine  use disorder Patient reports last cocaine use  roughly 30 days years ago although UDS on admission positive for cocaine. -- Counseled on need for complete cessation/abstinence -- TOC consult for substance abuse resources  Obesity, class III Body mass index is 45.73 kg/m.  Complicates all facets of care    DVT prophylaxis: enoxaparin (LOVENOX) injection 40 mg Start: 11/29/23 1000    Code Status: Full Code Family Communication: No family present at bedside this morning  Disposition Plan:  Level of care: Telemetry Medical Status is: Inpatient Remains inpatient appropriate because: IV steroids, anticipate discharge home tomorrow after receives fifth dose of IV Solu-Medrol    Consultants:  Neurology  Procedures:  Lumbar puncture  Antimicrobials:  None   Subjective: Patient seen examined at bedside, sitting at edge of bed.  Continues with right upper extremity weakness, paresthesias; reports continued gradual improvement.  Remains on IV steroids x 5 days per neurology; day #4 of 5.   No other specific complaints, concerns or questions at this time.  Denies headache, no dizziness, no chest pain, no palpitations, no fever/chills/night sweats, no nausea/vomiting/diarrhea, no cough/congestion, no abdominal pain, no fatigue.  No acute events overnight per nursing staff.  Discussed anticipated discharge home tomorrow.  Objective: Vitals:   12/01/23 2347 12/02/23 0325 12/02/23 0752 12/02/23 1145  BP: 126/70 125/62 (!) 147/90 125/77  Pulse: 67 (!) 54 61 63  Resp: 18 18 18 18   Temp: 97.8 F (36.6 C) 97.7 F (36.5 C) 98.3 F (36.8 C) 98.4 F (36.9 C)  TempSrc: Oral Oral Oral Oral  SpO2: 100% 98% 100% 98%  Weight:      Height:        Intake/Output Summary (Last 24 hours) at 12/02/2023 1157 Last data filed at 12/02/2023 0900 Gross per 24 hour  Intake 320 ml  Output --  Net 320 ml   Filed Weights   11/28/23 0956  Weight: 113.4 kg    Examination:  Physical Exam: GEN: NAD, alert and oriented x 3, obese, chronically  ill in appearance, appears older than stated age HEENT: NCAT, PERRL, EOMI, sclera clear, MMM PULM: CTAB w/o wheezes/crackles, normal respiratory effort, on room air CV: RRR w/o M/G/R GI: abd soft, NTND, NABS, no R/G/M MSK: no peripheral edema, right upper extremity with muscle strength 4/5, otherwise muscle strength globally intact NEURO: Right upper extremity with slightly decreased sensation to light touch, decreased muscle strength 4/5 otherwise no other focal neurological deficits  PSYCH: normal mood/affect Integumentary: dry/intact, no rashes or wounds    Data Reviewed: I have personally reviewed following labs and imaging studies  CBC: Recent Labs  Lab 11/27/23 1051 11/28/23 1028 11/29/23 0504  WBC 8.4 7.1 8.4  NEUTROABS  --  3.1  --   HGB 13.9 14.5 14.6  HCT 43.6 45.7 45.5  MCV 89.2 91.4 88.0  PLT 293 291 306   Basic Metabolic Panel: Recent Labs  Lab 11/27/23 1051 11/28/23 1028 11/29/23 0504  NA 139 139 136  K 4.0 4.1 4.0  CL 107 104 105  CO2 22 28 22   GLUCOSE 115* 124* 132*  BUN 15 13 11   CREATININE 0.86 0.76 0.82  CALCIUM 9.6 9.4 9.5   GFR: Estimated Creatinine Clearance: 89 mL/min (by C-G formula based on SCr of 0.82 mg/dL). Liver Function Tests: Recent Labs  Lab 11/28/23 1028 11/28/23 2233  AST 23  --   ALT 31  --   ALKPHOS  89  --   BILITOT 0.4  --   PROT 7.6  --   ALBUMIN 4.2 3.9   No results for input(s): "LIPASE", "AMYLASE" in the last 168 hours. No results for input(s): "AMMONIA" in the last 168 hours. Coagulation Profile: Recent Labs  Lab 11/28/23 1028  INR 0.9   Cardiac Enzymes: Recent Labs  Lab 11/28/23 1048  CKTOTAL 156   BNP (last 3 results) No results for input(s): "PROBNP" in the last 8760 hours. HbA1C: No results for input(s): "HGBA1C" in the last 72 hours.  CBG: Recent Labs  Lab 12/01/23 2002 12/01/23 2348 12/02/23 0324 12/02/23 0652 12/02/23 1143  GLUCAP 204* 137* 124* 132* 107*   Lipid Profile: No results  for input(s): "CHOL", "HDL", "LDLCALC", "TRIG", "CHOLHDL", "LDLDIRECT" in the last 72 hours. Thyroid Function Tests: No results for input(s): "TSH", "T4TOTAL", "FREET4", "T3FREE", "THYROIDAB" in the last 72 hours. Anemia Panel: No results for input(s): "VITAMINB12", "FOLATE", "FERRITIN", "TIBC", "IRON", "RETICCTPCT" in the last 72 hours.  Sepsis Labs: No results for input(s): "PROCALCITON", "LATICACIDVEN" in the last 168 hours.  Recent Results (from the past 240 hour(s))  SARS Coronavirus 2 by RT PCR (hospital order, performed in [facility] hospital lab) *cepheid single result test* Anterior Nasal Swab     Status: None   Collection Time: 11/28/23 10:33 PM   Specimen: Anterior Nasal Swab  Result Value Ref Range Status   SARS Coronavirus 2 by RT PCR NEGATIVE NEGATIVE Final    Comment: (NOTE) SARS-CoV-2 target nucleic acids are NOT DETECTED.  The SARS-CoV-2 RNA is generally detectable in upper and lower respiratory specimens during the acute phase of infection. The lowest concentration of SARS-CoV-2 viral copies this assay can detect is 250 copies / mL. A negative result does not preclude SARS-CoV-2 infection and should not be used as the sole basis for treatment or other patient management decisions.  A negative result may occur with improper specimen collection / handling, submission of specimen other than nasopharyngeal swab, presence of viral mutation(s) within the areas targeted by this assay, and inadequate number of viral copies (<250 copies / mL). A negative result must be combined with clinical observations, patient history, and epidemiological information.  Fact Sheet for Patients:   RoadLapTop.co.za  Fact Sheet for Healthcare Providers: http://kim-miller.com/  This test is not yet approved or  cleared by the [place] FDA and has been authorized for detection and/or diagnosis of SARS-CoV-2 by FDA under an Emergency Use  Authorization (EUA).  This EUA will remain in effect (meaning this test can be used) for the duration of the COVID-19 declaration under Section 564(b)(1) of the Act, 21 U.S.C. section 360bbb-3(b)(1), unless the authorization is terminated or revoked sooner.  Performed at [facility], [address], [place], [place] [address]          Radiology Studies: No results found.      Scheduled Meds:  atorvastatin  80 mg Oral Daily   enoxaparin (LOVENOX) injection  40 mg Subcutaneous Q24H   folic acid  1 mg Oral Daily   insulin aspart  0-20 Units Subcutaneous TID WC   insulin aspart  0-5 Units Subcutaneous QHS   isosorbide mononitrate  30 mg Oral Daily   lamoTRIgine  200 mg Oral Daily   losartan  50 mg Oral Daily   multivitamin with minerals  1 tablet Oral Daily   pantoprazole  40 mg Oral Daily   QUEtiapine  100 mg Oral QHS   [START ON 12/07/2023] thiamine (  VITAMIN B1) injection  100 mg Intravenous Daily   Continuous Infusions:  methylPREDNISolone (SOLU-MEDROL) injection 1,000 mg (12/02/23 0952)   thiamine (VITAMIN B1) injection 250 mg (12/02/23 1139)     LOS: 3 days    Time spent: 58 minutes spent on chart review, discussion with nursing staff, consultants, updating family and interview/physical exam; more than 50% of that time was spent in counseling and/or coordination of care.    Alvira Philips [place], DO [facility] Available via Epic secure chat 7am-7pm After these hours, please refer to coverage provider listed on [email or web address] 12/02/2023, 11:57 AM

## 2023-12-02 NOTE — Plan of Care (Signed)
  Problem: Health Behavior/Discharge Planning: Goal: Ability to manage health-related needs will improve Outcome: Progressing   Problem: Metabolic: Goal: Ability to maintain appropriate glucose levels will improve Outcome: Progressing   Problem: Nutritional: Goal: Maintenance of adequate nutrition will improve Outcome: Progressing   Problem: Clinical Measurements: Goal: Will remain free from infection Outcome: Progressing Goal: Diagnostic test results will improve Outcome: Progressing   Problem: Activity: Goal: Risk for activity intolerance will decrease Outcome: Progressing

## 2023-12-02 NOTE — Telephone Encounter (Signed)
Patient needs an after ED visit after 12/03/2023.

## 2023-12-02 NOTE — Progress Notes (Addendum)
NEUROLOGY CONSULT FOLLOW UP NOTE   Date of service: December 02, 2023 [name] MRN:  [id number] DOB:  06-Dec-1966  Brief HPI  Cheyenne Fowler is a 57 y.o. 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 LP without pleocytosis and with normal protein. IgG index is pending.   Interval Hx/subjective   Rates her RUE strength as a 4/10 today, which is unchanged from yesterday though she does comment that her hand strength and leg weakness are improved. For reference, day 1 she rates 0/10 strength and it has been gradually improving. She does have some R shoulder pain and soreness from what she thinks is due to overuse.  Vitals   Vitals:   12/01/23 2003 12/01/23 2347 12/02/23 0325 12/02/23 0752  BP: (!) 140/105 126/70 125/62 (!) 147/90  Pulse: 60 67 (!) 54 61  Resp: 18 18 18 18   Temp: 97.8 F (36.6 C) 97.8 F (36.6 C) 97.7 F (36.5 C) 98.3 F (36.8 C)  TempSrc: Oral Oral Oral Oral  SpO2: 100% 100% 98% 100%  Weight:      Height:         Body mass index is 45.73 kg/m.  Physical Exam   Constitutional: Appears staged age, in no acute distress.  Psych: Affect appropriate to situation.  Eyes: No scleral injection.  HENT: No OP obstrucion.   Head: Normocephalic.  Cardiovascular: Normal rate and regular rhythm.  Respiratory: Effort normal, non-labored breathing.  GI: Soft.  No distension. There is no tenderness.  Skin: WDI.   Neurologic Examination   Mental Status: Patient is awake, alert, oriented x3 No signs of aphasia or neglect Cranial Nerves: II: Pupils equal, round, and reactive to light.   III,IV, VI: EOMI without ptosis or diploplia.  V: Facial sensation is symmetric to light touch and temperature. VII: Facial movement is symmetric.  VIII: Hearing is intact to voice X: Uvula elevates symmetrically XI: Shoulder shrug is symmetric. XII: Tongue is midline  without atrophy or fasciculations.  Motor: RLE is 4/5 with at hip and knee and 5/5 for foot plantarflexion and dorsiflexion. RUE is 5/5 shoulder abduction, 3+/5 at elbow flexion, 4/5 elbow extension, 5/5 with finger abduction though slightly decreased from L. LLE and LUE are 5/5.  Sensory: Sensation is grossly intact and equal bilateral UE & LE.  Medications  Current Facility-Administered Medications:    acetaminophen (TYLENOL) tablet 650 mg, 650 mg, Oral, Q6H PRN, 650 mg at 11/29/23 2225 **OR** acetaminophen (TYLENOL) suppository 650 mg, 650 mg, Rectal, Q6H PRN, Julian Reil, Jared M, DO   atorvastatin (LIPITOR) tablet 80 mg, 80 mg, Oral, Daily, Julian Reil, Jared M, DO, 80 mg at 12/01/23 0848   enoxaparin (LOVENOX) injection 40 mg, 40 mg, Subcutaneous, Q24H, Julian Reil, Jared M, DO, 40 mg at 12/01/23 0848   folic acid (FOLVITE) tablet 1 mg, 1 mg, Oral, Daily, Julian Reil, Jared M, DO, 1 mg at 12/01/23 0848   insulin aspart (novoLOG) injection 0-20 Units, 0-20 Units, Subcutaneous, TID WC, Hillary Bow, DO, 3 Units at 12/02/23 4098   insulin aspart (novoLOG) injection 0-5 Units, 0-5 Units, Subcutaneous, QHS, Julian Reil, Jared M, DO, 2 Units at 12/01/23 2113   isosorbide mononitrate (IMDUR) 24 hr tablet 30 mg, 30 mg, Oral, Daily, Julian Reil, Jared M, DO, 30 mg at 12/01/23 0848   lamoTRIgine (LAMICTAL) tablet 200 mg, 200 mg, Oral, Daily, Julian Reil, Jared M, DO, 200 mg at  12/01/23 0848   losartan (COZAAR) tablet 50 mg, 50 mg, Oral, Daily, Julian Reil, Jared M, DO, 50 mg at 12/01/23 0848   methylPREDNISolone sodium succinate (SOLU-MEDROL) 1,000 mg in sodium chloride 0.9 % 50 mL IVPB, 1,000 mg, Intravenous, Q24H, Bhagat, Srishti L, MD, Last Rate: 66 mL/hr at 12/01/23 0431, 1,000 mg at 12/01/23 0431   multivitamin with minerals tablet 1 tablet, 1 tablet, Oral, Daily, Julian Reil, Jared M, DO, 1 tablet at 12/01/23 0848   ondansetron (ZOFRAN) tablet 4 mg, 4 mg, Oral, Q6H PRN **OR** ondansetron (ZOFRAN) injection 4 mg, 4 mg,  Intravenous, Q6H PRN, Julian Reil, Jared M, DO   oxyCODONE (Oxy IR/ROXICODONE) immediate release tablet 5 mg, 5 mg, Oral, Q4H PRN, [place], Alvira Philips, DO, 5 mg at 12/01/23 2201   pantoprazole (PROTONIX) EC tablet 40 mg, 40 mg, Oral, Daily, Julian Reil, Jared M, DO, 40 mg at 12/01/23 0848   QUEtiapine (SEROQUEL) tablet 100 mg, 100 mg, Oral, QHS, Julian Reil, Jared M, DO, 100 mg at 12/01/23 2114   [COMPLETED] thiamine (VITAMIN B1) 500 mg in sodium chloride 0.9 % 50 mL IVPB, 500 mg, Intravenous, Q8H, Last Rate: 110 mL/hr at 11/30/23 2217, 500 mg at 11/30/23 2217 **FOLLOWED BY** thiamine (VITAMIN B1) 250 mg in sodium chloride 0.9 % 50 mL IVPB, 250 mg, Intravenous, Daily, Last Rate: 105 mL/hr at 12/01/23 0859, 250 mg at 12/01/23 0859 **FOLLOWED BY** [START ON 12/07/2023] thiamine (VITAMIN B1) injection 100 mg, 100 mg, Intravenous, Daily, Erick Blinks, MD  Labs and Diagnostic Imaging   Anti-MOG antibodies negative Homocystine 9.3 Folate 12.5 Vitamin B12 409 Antiphospholipid syndrome evaluation WNL ANA negative Varicella IgG reactive Varicella IgM negative Vitamin E pending Sjogren's syndrome a antibody less than 0.2 Sjogren's syndrome B antibody less than 0.2 RPR nonreactive Rheumatoid factor 14.4, mildly elevated Neuromyelitis optica autoantibody negative Methylmalonic acid WNL Lyme disease serology negative HLTV 1 and 2 antibodies pending HIV nonreactive Copper 135, normal Ceruloplasmin 33.6, normal ANCA negative Anti-double-stranded DNA antibody negative CRP 0.7 ESR 4 CK 156   CSF IgG index WNL CSF oligoclonal bands pending Meningitis/encephalitis panel negative CSF glucose 64 CSF protein 27 CSF cell count tube 1 2290 red cells, 1 white cell CSF cell count tube 443 red cells 0 white cells   UDS positive for opiates and cocaine  Assessment   Cheyenne Fowler is a 57 y.o. female who presented with rapid-onset R-sided numbness and weakness, found to have non-enhancing T2 lesion of  spinal cord. She continues to report improvement of her weakness with solumedrol. No overt evidence of infectious etiology thus far.   Most consistent with transverse myelitis thus far given response to steroids.  Recommendations  -Continue solumedrol, day 4/5 -Follow remaning labs -PT/OT -Neurology will continue to follow ______________________________________________________________________   Signed, Champ Mungo, DO Internal Medicine PGY-3  Have seen the patient reviewed the above note.  IgG index is negative, but she continues to have significant improvement with Solu-Medrol.  An ischemic myelopathy in the setting of cocaine use would be the main differential, but I would not expect this to be improving as much as it is and therefore I would favor continuing to treat this as transverse myelitis.  With her making significant progress, I do not think I would do any second line immune suppression, but would rather work with physical therapy following completion of her IV Solu-Medrol.  Neurology will follow.  Ritta Slot, MD [facility] [phone]  If 7pm- 7am, please page neurology on call as listed in [facility].

## 2023-12-03 DIAGNOSIS — G373 Acute transverse myelitis in demyelinating disease of central nervous system: Secondary | ICD-10-CM | POA: Diagnosis not present

## 2023-12-03 LAB — GLUCOSE, CAPILLARY
Glucose-Capillary: 175 mg/dL — ABNORMAL HIGH (ref 70–99)
Glucose-Capillary: 91 mg/dL (ref 70–99)
Glucose-Capillary: 122 mg/dL — ABNORMAL HIGH (ref 70–99)

## 2023-12-03 NOTE — TOC Transition Note (Signed)
Transition of Care [facility]) - CM/SW Discharge Note   Patient Details  Name: Cheyenne Fowler MRN: [id number] Date of Birth: [birth year]  [facility] CM/SW Contact:  Kermit Balo, RN Phone Number: 12/03/2023, 10:49 AM   Clinical Narrative:     Pt is discharging home with outpatient therapy through [facility]. Information on the AVS.  Pt has transportation home.   Final next level of care: OP Rehab Barriers to Discharge: No Barriers Identified   Patient Goals and CMS Choice   Choice offered to / list presented to : Patient  Discharge Placement                         Discharge Plan and Services Additional resources added to the After Visit Summary for     Discharge Planning Services: CM Consult                                 Social Determinants of Health (SDOH) Interventions SDOH Screenings   Food Insecurity: No Food Insecurity (11/29/2023)  Housing: Low Risk  (11/29/2023)  Transportation Needs: No Transportation Needs (11/29/2023)  Utilities: Not At Risk (11/29/2023)  Depression (PHQ2-9): High Risk (10/07/2023)  Financial Resource Strain: Low Risk  (01/23/2022)  Tobacco Use: High Risk (11/28/2023)     Readmission Risk Interventions     No data to display

## 2023-12-03 NOTE — Discharge Summary (Signed)
Physician Discharge Summary  Cheyenne Fowler:[id number] DOB: 30-May-1966 DOA: 11/28/2023  PCP: Julieanne Manson, MD  Admit date: 11/28/2023 Discharge date: 12/03/2023  Admitted From: Home Disposition: Home  Recommendations for Outpatient Follow-up:  Follow up with PCP in 1-2 weeks Follow-up with neurology with recommended repeat MR C-spine with and without contrast due to previous abnormalities Continue to encourage illicit drug cessation with cocaine  Home Health: Outpatient PT Equipment/Devices: None  Discharge Condition: Stable CODE STATUS: Full code Diet recommendation: Heart healthy/consistent carbohydrate diet  History of present illness:  Cheyenne Fowler is a 57 y.o. female with past medical history significant for HTN, DM2, bipolar disorder, cocaine use disorder, alcohol use disorder, GERD, history of GI bleed who presented to [facility] ED on 12/6 with complaints of right arm pain, numbness, weakness since 11/27/2023.  Initially presented to [facility] on 12/5 but left without being seen.  Onset of symptoms about 0 830 and 12/5 and continued to be persistent and worsening.  Patient reports last cocaine use roughly 30 days ago.   In the ED, temperature 97.5 F, HR 73, RR 17, BP 119/74, SpO2 93% on room air.  WBC 7.1, hemoglobin 14.5, platelet count 291.  Sodium 139, potassium 4.1, chloride 104, CO2 28, glucose 124, BUN 13, creatinine 0.76.  AST 23, ALT 31, total bilirubin 0.4.  CK1 56.  CRP 0.7.  ESR 4.  INR 0.9.  UDS positive for opiates and cocaine.  MRI brain without contrast with no acute intracranial abnormality, diffuse decrease of T1 signal upper cervical spine and calvarium.  MRI C-spine with and without contrast with hyperintense T2 weighted signal ventral spinal cord asymmetric to the right at C3-6 levels consistent with transverse myelitis or other nonspecific demyelinating process, mild spinal canal stenosis and severe left neural foraminal  stenosis C6/7.  Patient underwent lumbar puncture with CSF analysis with 1 WBC total protein 27 glucose 64.  Meningitis/encephalitis panel negative.  Neurology was consulted and patient was transferred to [facility] for further evaluation management of concern of transverse myelitis.  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  Also complicated by continued cocaine abuse with positive UDS. MRI brain without contrast with no acute intracranial abnormality, diffuse decrease of T1 signal upper cervical spine and calvarium.  MRI C-spine with and without contrast with hyperintense T2 weighted signal ventral spinal cord asymmetric to the right at C3-6 levels consistent with transverse myelitis or other nonspecific demyelinating process, mild spinal canal stenosis and severe left neural foraminal stenosis C6/7.  MR T-spine with no acute findings. Patient underwent lumbar puncture with CSF analysis with 1 WBC total protein 27 glucose 64.  Meningitis/encephalitis panel negative.  Seen by neurology and followed during hospital course.  Rheumatoid factor 14.4 slightly elevated from upper limit of normal.  Additional workup with HIV/RPR, SSA/SSB, ceruloplasmin, double-stranded DNA antibodies, homocystine, antiphospholipid antibodies, ANA, ANCA, Lyme disease, VZV IgM, serum copper, methylmalonic acid, and NMO IgG, MOG antibody were negative.  Patient completed 5-day course of IV Solu-Medrol with improvement of symptoms.  Although neurology believes this could also be related to ischemic myelopathy secondary to her active cocaine abuse.  Recommend outpatient follow-up with neurology with repeat MR C-spine with and without contrast at that time.  Discharging with recommendation of outpatient PT.   Type 2 diabetes mellitus Hemoglobin A1c 6.1.  On metformin outpatient.    Essential hypertension Losartan  50 mg p.o. daily, Imdur 30 mg p.o. daily   Hyperlipidemia Atorvastatin 80 mg p.o. daily   Bipolar 1 disorder Lamictal 200 mg p.o. daily, Seroquel 100 mg p.o. nightly. Outpatient follow-up with behavioral health   EtOH use disorder Patient reports drinks 40 ounce beer daily.  Patient will monitor on CIWA protocol with no signs of withdrawal symptoms while inpatient. Counseled on need for complete cessation/abstinence.  Seen by [facility] for substance abuse resources.   Cocaine  use disorder Patient reports last cocaine use roughly 30 days years ago although UDS on admission positive for cocaine. Counseled on need for complete cessation/abstinence. TOC consulted for substance abuse resources   Obesity, class III Body mass index is 45.73 kg/m.  Complicates all facets of care  Discharge Diagnoses:  Principal Problem:   Acute transverse myelitis (HCC) Active Problems:   Type 2 diabetes mellitus (HCC)   Bipolar I disorder, most recent episode depressed (HCC)   Cocaine use disorder, mild, abuse (HCC)   Alcohol use   HTN (hypertension)    Discharge Instructions  Discharge Instructions     Ambulatory referral to Neurology   Complete by: As directed    An appointment is requested in approximately: 2 weeks   Ambulatory referral to Occupational Therapy   Complete by: As directed    Ambulatory referral to Physical Therapy   Complete by: As directed    Call MD for:  difficulty breathing, headache or visual disturbances   Complete by: As directed    Call MD for:  extreme fatigue   Complete by: As directed    Call MD for:  persistant dizziness or light-headedness   Complete by: As directed    Call MD for:  persistant nausea and vomiting   Complete by: As directed    Call MD for:  severe uncontrolled pain   Complete by: As directed    Call MD for:  temperature >100.4   Complete by: As directed    Diet - low sodium heart healthy   Complete by: As  directed    Increase activity slowly   Complete by: As directed       Allergies as of 12/03/2023       Reactions   Penicillins Hives   Sulfonamide Derivatives Hives        Medication List     STOP taking these medications    aspirin EC 81 MG tablet   BLOOD GLUCOSE TEST STRIPS Strp   Lancets Misc. Misc       TAKE these medications    atorvastatin 80 MG tablet Commonly known as: LIPITOR Take 1 tablet (80 mg total) by mouth daily.   carvedilol 3.125 MG tablet Commonly known as: COREG Take 1 tablet (3.125 mg total) by mouth 2 (two) times daily with a meal. What changed: when to take this   cetirizine 10 MG tablet Commonly known as: ZYRTEC Take 1 tablet (10 mg total) by mouth daily.   hydrOXYzine 50 MG capsule Commonly known as: VISTARIL Take 1 capsule (50 mg total) by mouth every 6 (six) hours as needed. What changed: when to take this   isosorbide mononitrate 30 MG 24 hr tablet Commonly known as: IMDUR Take 1 tablet (30 mg total) by mouth daily.   lamoTRIgine 200 MG tablet Commonly known as: LaMICtal Take 1 tablet (200 mg total) by mouth daily.   losartan 50 MG tablet Commonly known as: COZAAR Take 1 tablet (50 mg total) by mouth daily.   metFORMIN 500  MG 24 hr tablet Commonly known as: GLUCOPHAGE-XR TAKE 1 TABLET BY MOUTH TWICE DAILY WITH MEALS   nitroGLYCERIN 0.4 MG SL tablet Commonly known as: NITROSTAT Place 1 tablet (0.4 mg total) under the tongue every 5 (five) minutes as needed for chest pain.   pantoprazole 40 MG tablet Commonly known as: PROTONIX Take 1 tablet by mouth twice daily What changed: when to take this   QUEtiapine 100 MG tablet Commonly known as: SEROQUEL Take 1 tablet (100 mg total) by mouth at bedtime.        Follow-up Information     [facility]. Schedule an appointment as soon as possible for a visit.   Specialty: Rehabilitation Contact information: [address] [place] [address] [phone]        Julieanne Manson, MD. Schedule an appointment as soon as possible for a visit in 1 week(s).   Specialty: Internal Medicine Contact information: [address] [place] [address] [phone]         [facility]. Schedule an appointment as soon as possible for a visit.   Specialty: Neurology Contact information: [address] [place] [address] [phone]               Allergies  Allergen Reactions   Penicillins Hives   Sulfonamide Derivatives Hives    Consultations: Neurology   Procedures/Studies: MR THORACIC SPINE W WO CONTRAST  Result Date: 11/30/2023 CLINICAL DATA:  Acute thoracic myelopathy EXAM: MRI THORACIC WITHOUT AND WITH CONTRAST TECHNIQUE: Multiplanar and multiecho pulse sequences of the thoracic spine were obtained without and with intravenous contrast. CONTRAST:  10mL GADAVIST GADOBUTROL 1 MMOL/ML IV SOLN COMPARISON:  None Available. FINDINGS: Alignment:  Physiologic. Vertebrae: No fracture, evidence of discitis, or bone lesion. Cord:  Normal signal and morphology. Paraspinal and other soft tissues: Negative. Disc levels: No spinal canal stenosis. No abnormal contrast enhancement. IMPRESSION: Normal MRI of the thoracic spine. Electronically Signed   By: Deatra Robinson M.D.   On: 11/30/2023 02:30   MR BRACHIAL PLEXUS W WO CM RT  Result Date: 11/29/2023 CLINICAL DATA:  Right upper extremity weakness. EXAM: MRI BRACHIAL PLEXUS WITHOUT CONTRAST TECHNIQUE: Multiplanar, multiecho pulse sequences of the neck and surrounding structures were obtained without intravenous contrast. The field of view was focused on the rightbrachial plexus from the neural foramina to the axilla. COMPARISON:  MRI of the cervical spine same date. Cervical spine CT 08/28/2022. FINDINGS: Technical note: Despite efforts by the technologist and patient, mild motion artifact is present on  today's exam and could not be eliminated. This reduces exam sensitivity and specificity. Spinal cord As seen on concurrent cervical MRI, there is ventral T2 hyperintensity within the spinal cord asymmetric to the right. No appreciable abnormal cord enhancement. Underlying multilevel spondylosis as detailed on separate cervical spine MRI. Brachial plexus: No mass lesion, fluid collection or abnormal enhancement identified associated with the brachial plexus. Muscles and tendons The shoulder musculature appears bilaterally symmetric, without atrophy, edema or abnormal enhancement. No gross abnormality of the right rotator cuff on large field-of-view imaging. Bones/Joints No acute or aggressive osseous findings. Mild acromioclavicular degenerative changes bilaterally. Cervical spondylosis as detailed on separate examination. Other findings None IMPRESSION: 1. No mass lesion, fluid collection or abnormal enhancement identified associated with the right brachial plexus. 2. Ventral T2 hyperintensity within the cervical spinal cord asymmetric to the right, better detailed on concurrent cervical spine MRI. See separate report. 3. No acute  osseous findings. Electronically Signed   By: Carey Bullocks M.D.   On: 11/29/2023 08:18   MR Cervical Spine W and Wo Contrast  Result Date: 11/28/2023 CLINICAL DATA:  Right arm pain, numbness and weakness EXAM: MRI CERVICAL SPINE WITHOUT AND WITH CONTRAST TECHNIQUE: Multiplanar and multiecho pulse sequences of the cervical spine, to include the craniocervical junction and cervicothoracic junction, were obtained without and with intravenous contrast. CONTRAST:  10mL GADAVIST GADOBUTROL 1 MMOL/ML IV SOLN COMPARISON:  None Available. FINDINGS: Alignment: Physiologic. Vertebrae: No fracture, evidence of discitis, or bone lesion. Cord: There is hyperintense T2-weighted signal within the ventral spinal cord, asymmetric to the right at the C3-6 levels. No abnormal contrast enhancement.  Posterior Fossa, vertebral arteries, paraspinal tissues: Negative. Disc levels: C1-2: Unremarkable. C2-3: Normal disc space and facet joints. There is no spinal canal stenosis. No neural foraminal stenosis. C3-4: Intermediate sized disc osteophyte complex. There is no spinal canal stenosis. Mild bilateral neural foraminal stenosis. C4-5: Normal disc space and facet joints. There is no spinal canal stenosis. No neural foraminal stenosis. C5-6: Small disc bulge. There is no spinal canal stenosis. No neural foraminal stenosis. C6-7: Left asymmetric disc bulge with bilateral uncovertebral hypertrophy. Mild spinal canal stenosis. Severe left neural foraminal stenosis. C7-T1: Normal disc space and facet joints. There is no spinal canal stenosis. No neural foraminal stenosis. IMPRESSION: 1. Hyperintense T2-weighted signal within the ventral spinal cord, asymmetric to the right at the C3-6 levels. This is most consistent with transverse myelitis or other nonspecific demyelinating process. 2. Mild spinal canal stenosis and severe left neural foraminal stenosis at C6-7. Electronically Signed   By: Deatra Robinson M.D.   On: 11/28/2023 21:32   MR BRAIN WO CONTRAST  Result Date: 11/28/2023 CLINICAL DATA:  Neuro deficit, acute, stroke suspected head CT January 10, 2019. EXAM: MRI HEAD WITHOUT CONTRAST TECHNIQUE: Multiplanar, multiecho pulse sequences of the brain and surrounding structures were obtained without intravenous contrast. COMPARISON:  None Available. FINDINGS: Brain: No acute infarction, hemorrhage, hydrocephalus, extra-axial collection or mass lesion. Vascular: Normal flow voids. Incidentally noted hypoplastic vertebrobasilar system. Skull and upper cervical spine: Diffuse decrease of the T1 signal the visualized upper cervical spine and calvarium, may be related to red marrow reconversion. Sinuses/Orbits: Bilateral proptosis.  Paranasal sinuses are clear. Other: None. IMPRESSION: 1. No acute intracranial  abnormality. 2. Diffuse decrease of the T1 signal the visualized upper cervical spine and calvarium, may be related to red marrow reconversion. Electronically Signed   By: Baldemar Lenis M.D.   On: 11/28/2023 12:09   DG Shoulder Right  Result Date: 11/27/2023 CLINICAL DATA:  Right shoulder pain and numbness. EXAM: RIGHT SHOULDER - 2+ VIEW COMPARISON:  None Available. FINDINGS: No acute fracture or dislocation. No aggressive osseous lesion. Glenohumeral and acromioclavicular joints are normal in alignment and exhibit mild-to-moderate degenerative changes. No soft tissue swelling. No radiopaque foreign bodies. IMPRESSION: *No acute osseous abnormality of the right shoulder joint. Mild-to-moderate degenerative joint disease. Electronically Signed   By: Jules Schick M.D.   On: 11/27/2023 12:13     Subjective: Patient seen examined bedside, resting calmly.  Sitting at edge of bed.  Right upper extremity weakness continues to improve.  To complete last dose of IV Solu-Medrol to complete 5-day course today.  Neurology recommends outpatient follow-up with repeat MR C-spine with and without contrast at time of visit.  Discussed with patient once again to [place] from all illicit drug use to include cocaine which is likely contributing factor to her  presentation.  No other specific questions, concerns or complaints at this time.  Denies headache, no visual changes, no chest pain, no palpitations, no shortness of breath, no abdominal pain, no fever/chills/night sweats, no nausea/vomiting/diarrhea, no focal weakness, no fatigue, no paresthesia.  No acute events overnight per nurse staff.  Discharge Exam: Vitals:   12/03/23 0344 12/03/23 0805  BP: 128/75 (!) 153/63  Pulse: 66 60  Resp: 17 18  Temp: (!) 97.5 F (36.4 C) 98.3 F (36.8 C)  SpO2: 100% 97%   Vitals:   12/02/23 1606 12/02/23 1953 12/03/23 0344 12/03/23 0805  BP: 111/64 139/84 128/75 (!) 153/63  Pulse: 66 60 66 60  Resp: 18 18  17 18   Temp: 98.1 F (36.7 C) 97.8 F (36.6 C) (!) 97.5 F (36.4 C) 98.3 F (36.8 C)  TempSrc: Oral Oral Oral Oral  SpO2: 100% 100% 100% 97%  Weight:      Height:        Physical Exam: GEN: NAD, alert and oriented x 3, obese, chronically ill in appearance, appears older than stated age HEENT: NCAT, PERRL, EOMI, sclera clear, MMM PULM: CTAB w/o wheezes/crackles, normal respiratory effort, on room air CV: RRR w/o M/G/R GI: abd soft, NTND, NABS, no R/G/M MSK: no peripheral edema, right upper extremity with muscle strength 4/5, otherwise muscle strength globally intact NEURO: Right upper extremity with slightly decreased sensation to light touch, decreased muscle strength 4/5 otherwise no other focal neurological deficits  PSYCH: normal mood/affect Integumentary: dry/intact, no rashes or wounds    The results of significant diagnostics from this hospitalization (including imaging, microbiology, ancillary and laboratory) are listed below for reference.     Microbiology: Recent Results (from the past 240 hour(s))  SARS Coronavirus 2 by RT PCR (hospital order, performed in [facility] hospital lab) *cepheid single result test* Anterior Nasal Swab     Status: None   Collection Time: 11/28/23 10:33 PM   Specimen: Anterior Nasal Swab  Result Value Ref Range Status   SARS Coronavirus 2 by RT PCR NEGATIVE NEGATIVE Final    Comment: (NOTE) SARS-CoV-2 target nucleic acids are NOT DETECTED.  The SARS-CoV-2 RNA is generally detectable in upper and lower respiratory specimens during the acute phase of infection. The lowest concentration of SARS-CoV-2 viral copies this assay can detect is 250 copies / mL. A negative result does not preclude SARS-CoV-2 infection and should not be used as the sole basis for treatment or other patient management decisions.  A negative result may occur with improper specimen collection / handling, submission of specimen other than nasopharyngeal swab, presence  of viral mutation(s) within the areas targeted by this assay, and inadequate number of viral copies (<250 copies / mL). A negative result must be combined with clinical observations, patient history, and epidemiological information.  Fact Sheet for Patients:   RoadLapTop.co.za  Fact Sheet for Healthcare Providers: http://kim-miller.com/  This test is not yet approved or  cleared by the [place] FDA and has been authorized for detection and/or diagnosis of SARS-CoV-2 by FDA under an Emergency Use Authorization (EUA).  This EUA will remain in effect (meaning this test can be used) for the duration of the COVID-19 declaration under Section 564(b)(1) of the Act, 21 U.S.C. section 360bbb-3(b)(1), unless the authorization is terminated or revoked sooner.  Performed at [facility], [address] [place], [place] [address]      Labs: BNP (last 3 results) No results for input(s): "BNP" in the last 8760 hours. Basic  Metabolic Panel: Recent Labs  Lab 11/27/23 1051 11/28/23 1028 11/29/23 0504  NA 139 139 136  K 4.0 4.1 4.0  CL 107 104 105  CO2 22 28 22   GLUCOSE 115* 124* 132*  BUN 15 13 11   CREATININE 0.86 0.76 0.82  CALCIUM 9.6 9.4 9.5   Liver Function Tests: Recent Labs  Lab 11/28/23 1028 11/28/23 2233  AST 23  --   ALT 31  --   ALKPHOS 89  --   BILITOT 0.4  --   PROT 7.6  --   ALBUMIN 4.2 3.9   No results for input(s): "LIPASE", "AMYLASE" in the last 168 hours. No results for input(s): "AMMONIA" in the last 168 hours. CBC: Recent Labs  Lab 11/27/23 1051 11/28/23 1028 11/29/23 0504  WBC 8.4 7.1 8.4  NEUTROABS  --  3.1  --   HGB 13.9 14.5 14.6  HCT 43.6 45.7 45.5  MCV 89.2 91.4 88.0  PLT 293 291 306   Cardiac Enzymes: Recent Labs  Lab 11/28/23 1048  CKTOTAL 156   BNP: Invalid input(s): "POCBNP" CBG: Recent Labs  Lab 12/02/23 1604 12/02/23 1952 12/02/23 2325 12/03/23 0345  12/03/23 0622  GLUCAP 198* 190* 204* 175* 91   D-Dimer No results for input(s): "DDIMER" in the last 72 hours. Hgb A1c No results for input(s): "HGBA1C" in the last 72 hours. Lipid Profile No results for input(s): "CHOL", "HDL", "LDLCALC", "TRIG", "CHOLHDL", "LDLDIRECT" in the last 72 hours. Thyroid function studies No results for input(s): "TSH", "T4TOTAL", "T3FREE", "THYROIDAB" in the last 72 hours.  Invalid input(s): "FREET3" Anemia work up No results for input(s): "VITAMINB12", "FOLATE", "FERRITIN", "TIBC", "IRON", "RETICCTPCT" in the last 72 hours. Urinalysis    Component Value Date/Time   COLORURINE YELLOW 11/28/2023 1148   APPEARANCEUR HAZY (A) 11/28/2023 1148   LABSPEC 1.014 11/28/2023 1148   PHURINE 6.0 11/28/2023 1148   GLUCOSEU NEGATIVE 11/28/2023 1148   HGBUR NEGATIVE 11/28/2023 1148   BILIRUBINUR NEGATIVE 11/28/2023 1148   KETONESUR NEGATIVE 11/28/2023 1148   PROTEINUR NEGATIVE 11/28/2023 1148   UROBILINOGEN 1.0 07/12/2012 2150   NITRITE POSITIVE (A) 11/28/2023 1148   LEUKOCYTESUR NEGATIVE 11/28/2023 1148   Sepsis Labs Recent Labs  Lab 11/27/23 1051 11/28/23 1028 11/29/23 0504  WBC 8.4 7.1 8.4   Microbiology Recent Results (from the past 240 hour(s))  SARS Coronavirus 2 by RT PCR (hospital order, performed in [facility] hospital lab) *cepheid single result test* Anterior Nasal Swab     Status: None   Collection Time: 11/28/23 10:33 PM   Specimen: Anterior Nasal Swab  Result Value Ref Range Status   SARS Coronavirus 2 by RT PCR NEGATIVE NEGATIVE Final    Comment: (NOTE) SARS-CoV-2 target nucleic acids are NOT DETECTED.  The SARS-CoV-2 RNA is generally detectable in upper and lower respiratory specimens during the acute phase of infection. The lowest concentration of SARS-CoV-2 viral copies this assay can detect is 250 copies / mL. A negative result does not preclude SARS-CoV-2 infection and should not be used as the sole basis for treatment or  other patient management decisions.  A negative result may occur with improper specimen collection / handling, submission of specimen other than nasopharyngeal swab, presence of viral mutation(s) within the areas targeted by this assay, and inadequate number of viral copies (<250 copies / mL). A negative result must be combined with clinical observations, patient history, and epidemiological information.  Fact Sheet for Patients:   RoadLapTop.co.za  Fact Sheet for Healthcare Providers: http://kim-miller.com/  This  test is not yet approved or  cleared by the [place] and has been authorized for detection and/or diagnosis of SARS-CoV-2 by FDA under an Emergency Use Authorization (EUA).  This EUA will remain in effect (meaning this test can be used) for the duration of the COVID-19 declaration under Section 564(b)(1) of the Act, 21 U.S.C. section 360bbb-3(b)(1), unless the authorization is terminated or revoked sooner.  Performed at [facility], [address], [place], [place] [address]      Time coordinating discharge: Over 30 minutes  SIGNED:   Alvira Philips [place], DO  [facility] 12/03/2023, 10:10 AM

## 2023-12-03 NOTE — Progress Notes (Addendum)
NEUROLOGY CONSULT FOLLOW UP NOTE   Date of service: December 03, 2023 [name] MRN:  [id number] DOB:  Feb 06, 1966  Brief HPI  Cheyenne Fowler is a 57 y.o. 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 No overt etiology identified at this time.   Interval Hx/subjective   Rates her RUE strength as a 5-6/10 today, which is improved from yesterday when she rated it as a 4. She is excited today as she no longer feels like she is dragging her R leg/foot and has noticeable increase in strength and mobility of her RUE.   Vitals   Vitals:   12/02/23 1145 12/02/23 1606 12/02/23 1953 12/03/23 0344  BP: 125/77 111/64 139/84 128/75  Pulse: 63 66 60 66  Resp: 18 18 18 17   Temp: 98.4 F (36.9 C) 98.1 F (36.7 C) 97.8 F (36.6 C) (!) 97.5 F (36.4 C)  TempSrc: Oral Oral Oral Oral  SpO2: 98% 100% 100% 100%  Weight:      Height:         Body mass index is 45.73 kg/m.  Physical Exam   Constitutional: Appears staged age, in no acute distress.  Psych: Affect appropriate to situation.  Eyes: No scleral injection.  HENT: No OP obstrucion.   Head: Normocephalic.  Cardiovascular: Normal rate and regular rhythm.  Respiratory: Effort normal, non-labored breathing.  GI: Soft.  No distension. There is no tenderness.  Skin: WDI.   Neurologic Examination   Mental Status: Patient is awake, alert, oriented x3 No signs of aphasia or neglect Cranial Nerves: II: Pupils equal, round, and reactive to light.   III,IV, VI: EOMI without ptosis or diploplia.  V: Facial sensation is symmetric to light touch and temperature. VII: Facial movement is symmetric.  VIII: Hearing is intact to voice X: Uvula elevates symmetrically XI: Shoulder shrug is symmetric. XII: Tongue is midline without atrophy or fasciculations.  Motor: RLE is 5/5 with at hip and knee and 5/5 for foot plantarflexion  and dorsiflexion. RUE is 5/5 shoulder abduction, 3+-4-/5 at elbow flexion, 5/5 elbow extension, 5/5 with finger abduction. LLE and LUE are 5/5. 2-3/5 shoulder abduction Sensory: Sensation is grossly intact and equal bilateral UE & LE.  Medications  Current Facility-Administered Medications:    acetaminophen (TYLENOL) tablet 650 mg, 650 mg, Oral, Q6H PRN, 650 mg at 11/29/23 2225 **OR** acetaminophen (TYLENOL) suppository 650 mg, 650 mg, Rectal, Q6H PRN, Julian Reil, Jared M, DO   atorvastatin (LIPITOR) tablet 80 mg, 80 mg, Oral, Daily, Julian Reil, Jared M, DO, 80 mg at 12/02/23 0947   enoxaparin (LOVENOX) injection 40 mg, 40 mg, Subcutaneous, Q24H, Julian Reil, Jared M, DO, 40 mg at 12/02/23 4098   folic acid (FOLVITE) tablet 1 mg, 1 mg, Oral, Daily, Julian Reil, Jared M, DO, 1 mg at 12/02/23 0947   insulin aspart (novoLOG) injection 0-20 Units, 0-20 Units, Subcutaneous, TID WC, Hillary Bow, DO, 4 Units at 12/02/23 1643   insulin aspart (novoLOG) injection 0-5 Units, 0-5 Units, Subcutaneous, QHS, Julian Reil, Jared M, DO, 2 Units at 12/01/23 2113   isosorbide mononitrate (IMDUR) 24 hr tablet 30 mg, 30 mg, Oral, Daily, Julian Reil, Jared M, DO, 30 mg at 12/02/23 1191   lamoTRIgine (LAMICTAL) tablet 200 mg, 200 mg, Oral, Daily, Julian Reil, Jared M, DO, 200 mg at 12/02/23 0947   losartan (COZAAR) tablet 50 mg, 50 mg, Oral, Daily, Julian Reil, Jared M, DO, 50  mg at 12/02/23 0948   methylPREDNISolone sodium succinate (SOLU-MEDROL) 1,000 mg in sodium chloride 0.9 % 50 mL IVPB, 1,000 mg, Intravenous, Q24H, Bhagat, Srishti L, MD, Stopped at 12/02/23 1052   multivitamin with minerals tablet 1 tablet, 1 tablet, Oral, Daily, Julian Reil, Jared M, DO, 1 tablet at 12/02/23 0947   ondansetron (ZOFRAN) tablet 4 mg, 4 mg, Oral, Q6H PRN **OR** ondansetron (ZOFRAN) injection 4 mg, 4 mg, Intravenous, Q6H PRN, Julian Reil, Jared M, DO   oxyCODONE (Oxy IR/ROXICODONE) immediate release tablet 5 mg, 5 mg, Oral, Q4H PRN, [place], Eric J, DO, 5 mg at  12/02/23 2210   pantoprazole (PROTONIX) EC tablet 40 mg, 40 mg, Oral, Daily, Julian Reil, Jared M, DO, 40 mg at 12/02/23 4098   QUEtiapine (SEROQUEL) tablet 100 mg, 100 mg, Oral, QHS, Julian Reil, Jared M, DO, 100 mg at 12/02/23 2210   [COMPLETED] thiamine (VITAMIN B1) 500 mg in sodium chloride 0.9 % 50 mL IVPB, 500 mg, Intravenous, Q8H, Last Rate: 110 mL/hr at 11/30/23 2217, 500 mg at 11/30/23 2217 **FOLLOWED BY** thiamine (VITAMIN B1) 250 mg in sodium chloride 0.9 % 50 mL IVPB, 250 mg, Intravenous, Daily, Stopped at 12/02/23 1209 **FOLLOWED BY** [START ON 12/07/2023] thiamine (VITAMIN B1) injection 100 mg, 100 mg, Intravenous, Daily, Erick Blinks, MD  [facility]   Anti-MOG antibodies negative Homocystine 9.3 Folate 12.5 Vitamin B12 409 Antiphospholipid syndrome evaluation WNL ANA negative Varicella IgG reactive Varicella IgM negative Vitamin E pending Sjogren's syndrome a antibody less than 0.2 Sjogren's syndrome B antibody less than 0.2 RPR nonreactive Rheumatoid factor 14.4, mildly elevated Neuromyelitis optica autoantibody negative Methylmalonic acid WNL Lyme disease serology negative HLTV 1 and 2 antibodies negative HIV nonreactive Copper 135, normal Ceruloplasmin 33.6, normal ANCA negative Anti-double-stranded DNA antibody negative CRP 0.7 ESR 4 CK 156  CSF IgG index WNL CSF oligoclonal bands pending Meningitis/encephalitis panel negative CSF glucose 64 CSF protein 27 CSF cell count tube 1 2290 red cells, 1 white cell CSF cell count tube 443 red cells 0 white cells   UDS positive for opiates and cocaine  Assessment   Cheyenne Fowler is a 57 y.o. female who presented with rapid-onset R-sided numbness and weakness, found to have non-enhancing T2 lesion of spinal cord. She continues to report improvement of her weakness with solumedrol. No overt evidence of infectious etiology thus far. Vitamin E and CSF oligoclonal bands still pending.   As discussed  in previous note, while ischemic myelopathy is on differential in setting of cocaine use, her rapid response with initiation of solumedrol favors transverse myelitis and completion of treatment for that. As she has continued improvements I do not think additional immunotherapy or PLEX is indicated at this time.  Recommendations  -Complete course of solumedrol, day 5/5 -Recommend neurology follow-up, repeat MRI as outpatient  -Follow remaning labs -PT/OT  Neurology will sign off. Please do not hesitate to reach out with additional questions or concerns. ______________________________________________________________________   Aurther Loft, DO Internal Medicine PGY-3  I have seen the patient reviewed the above note.  Unclear significance of her borderline RF, otherwise negative workup.  I think I would favor repeating imaging in a month or two to ensure stability would be prudent.  She does not have any obvious evidence of inflammation, other than her steroid response which has been significant.  At this point, I would favor continue to work with physical therapy/Occupational Therapy and would not pursue any further aggressive immune suppression.  She will need to follow-up with  outpatient neurology.  Ritta Slot, MD [facility] [phone]  If 7pm- 7am, please page neurology on call as listed in [facility]ION.

## 2023-12-03 NOTE — Progress Notes (Signed)
Occupational Therapy Treatment Patient Details Name: Cheyenne Fowler MRN: [id number] DOB: Jul 22, 1966 Today's Date: 12/03/2023   History of present illness 57 y.o. female presented 11/28/23 with new onset R arm pain, numbness, and weakness. MRI C-spine concerning for acute transverse myelitis C3-6. MRI brain negative.  PMH significant of DM2, BPD, HTN.  Cocaine abuse   OT comments  Pt progressing towards goals this session, needing CGA for simulated tub transfer, ind with in room mobility and mgmt of IV pole. Pt educated on RUE HEP with shoulder AAROM, and AROM exercises, AROM exercises provided as a progression (to pt tolerance) and pt verbalized understanding. Pt up in room performing IADL task of packing bags/clothing for d/c upon arrival. Pt presenting with impairments listed below, will follow acutely. Continue to recommend OP OT at d/c.      If plan is discharge home, recommend the following:  A little help with walking and/or transfers;A little help with bathing/dressing/bathroom;Two people to help with bathing/dressing/bathroom;Direct supervision/assist for medications management;Assist for transportation;Help with stairs or ramp for entrance   Equipment Recommendations  None recommended by OT    Recommendations for Other Services PT consult    Precautions / Restrictions Precautions Precautions: Fall Restrictions Weight Bearing Restrictions: No       Mobility Bed Mobility Overal bed mobility: Independent                  Transfers Overall transfer level: Independent                       Balance                                           ADL either performed or assessed with clinical judgement   ADL Overall ADL's : Needs assistance/impaired                         Toilet Transfer: Independent       Tub/ Shower Transfer: Tub transfer;Contact guard assist;Ambulation   Functional mobility during ADLs: Contact guard  assist General ADL Comments: pt performing IADL task in room upon arrival, packing up items    Extremity/Trunk Assessment Upper Extremity Assessment RUE Deficits / Details: ~ 45* shoulder ROM, elbow ROM WFL but needs incr time to flex. wrist and hand ROM WFL RUE Sensation: decreased light touch;decreased proprioception RUE Coordination: decreased fine motor;decreased gross motor   Lower Extremity Assessment Lower Extremity Assessment: Defer to PT evaluation        Vision   Vision Assessment?: No apparent visual deficits   Perception Perception Perception: Not tested   Praxis Praxis Praxis: Not tested    Cognition Arousal: Alert Behavior During Therapy: WFL for tasks assessed/performed Overall Cognitive Status: Within Functional Limits for tasks assessed                                          Exercises Exercises: Other exercises Other Exercises Other Exercises: shoulder flex/ext AAROM Other Exercises: shoulder ER/IR AAROM Other Exercises: shoulder horizontal abd/add AAROM Other Exercises: elbow flex/ext AROM Other Exercises: squeeze ball    Shoulder Instructions       General Comments VSS    Pertinent Vitals/ Pain       Pain Assessment Pain  Assessment: No/denies pain  Home Living                                          Prior Functioning/Environment              Frequency  Min 1X/week        Progress Toward Goals  OT Goals(current goals can now be found in the care plan section)  Progress towards OT goals: Progressing toward goals  Acute Rehab OT Goals Patient Stated Goal: none stated OT Goal Formulation: With patient Time For Goal Achievement: 12/15/23 Potential to Achieve Goals: Good ADL Goals Pt Will Perform Tub/Shower Transfer: Tub transfer;Shower transfer;Independently;ambulating Pt/caregiver will Perform Home Exercise Program: With written HEP provided;With Supervision;Right Upper  extremity;Increased strength;Increased ROM  Plan      Co-evaluation                 AM-PAC OT "6 Clicks" Daily Activity     Outcome Measure   Help from another person eating meals?: A Little Help from another person taking care of personal grooming?: A Little Help from another person toileting, which includes using toliet, bedpan, or urinal?: A Little Help from another person bathing (including washing, rinsing, drying)?: A Little Help from another person to put on and taking off regular upper body clothing?: A Little Help from another person to put on and taking off regular lower body clothing?: A Little 6 Click Score: 18    End of Session    OT Visit Diagnosis: Unsteadiness on feet (R26.81);Other abnormalities of gait and mobility (R26.89);Muscle weakness (generalized) (M62.81);Other symptoms and signs involving the nervous system (R29.898)   Activity Tolerance Patient tolerated treatment well   Patient Left in bed;with call bell/phone within reach   Nurse Communication Mobility status        Time: 6578-4696 OT Time Calculation (min): 17 min  Charges: OT General Charges $OT Visit: 1 Visit OT Treatments $Therapeutic Activity: 8-22 mins  Carver Fila, OTD, OTR/L [facility]Chat [facility]erred Acute Rehab [phone]   Reve Crocket K Koonce 12/03/2023, 11:12 AM

## 2023-12-03 NOTE — Care Management Important Message (Signed)
Important Message  Patient Details  Name: BARNETTA BEVIER MRN: [id number] Date of Birth: 10/28/1966   Important Message Given:   le Patient left prior to IM delivery will mail a copy of IM to the patient home address.     Lakota Markgraf 12/03/2023, 4:12 PM

## 2023-12-04 LAB — VITAMIN E
Vitamin E (Alpha Tocopherol): 11.3 mg/L (ref 7.0–25.1)
Vitamin E(Gamma Tocopherol): 1.8 mg/L (ref 0.5–5.5)

## 2023-12-09 NOTE — Progress Notes (Unsigned)
[facility]  PATIENT: Cheyenne Fowler DOB: [birth year]  REFERRING DOCTOR OR PCP:  Ritta Slot, MD; Julieanne Manson, MD SOURCE: Patient, notes from recent hospitalization, imaging and lab reports, MRI images personally reviewed.  _________________________________   HISTORICAL  CHIEF COMPLAINT:  Chief Complaint  Patient presents with   Room 10    Pt is here with her caseworker. Pt states that she is having a lot of pain and numbness on her whole right side. Pt states that she can't life her arm.     HISTORY OF PRESENT ILLNESS:  I had the pleasure of seeing patient, Cheyenne Fowler, at [facility] for neurologic consultation regarding her arm weakness and abnormal cervical spine MRI.  Cheyenne Fowler is a 57 year old woman who had the onset of right arm weakness and some right arm pain when she woke up November 26, 2023.  She also had right arm and foot numbness.  Symptoms persisted so she went to the ED.   She was positive for cocaine in her urine tox screen but states she had not used it within a week of the event  She was admitted and given 5 days of IV Solu-Medrol.  MRI of the brain was essentially normal for age just showing a few T2/FLAIR hyperintense foci consistent with mild chronic microvascular ischemic change.  MRI of the cervical spine showed a longitudinally extensive T2 hyperintense focus from C2 C3-C6.  Of note, it did not appear to enhance after contrast.  She also has mild spinal stenosis at C3-C4 and C6-C7 and there is potential for right C4 above C7 nerve root compression at these levels.  MRI of the thoracic spine was normal.  While in the hospital, she did have some improvement in the strength of the right arm but not to baseline.  She was evaluated for multiple causes of transverse myelitis.  Anti-MOG and anti-NMO were negative.  Additionally, because she had used cocaine, ischemic etiology was also considered.  She has NIDDM,  PTSD/anxiety, HTN, hyperlipidemia, CAD.     Imaging: MRI of the head 11/28/2023 was normal for age showing a couple T2/FLAIR hyperintense foci in the subcortical or deep white matter consistent with mild chronic microvascular ischemic change.  MRI of the cervical spine 11/28/2023 showed a T2 hyperintense focus from C2-C3 to C6.  It was central but more to the right adjacent to C3.  Central gray may have been more involved adjacent to C5.  The focus did not enhance.  Mild spinal stenosis moderately severe left foraminal narrowing is noted at C6-C7.  Foraminal narrowing noted at C3-C4.  Potential for right C4 and left C7 nerve root compression.  MRI of the thoracic spine 11/30/2023 was normal.  MRI of the brachial plexus 11/28/2023 (report) was normal  REVIEW OF SYSTEMS: Constitutional: No fevers, chills, sweats, or change in appetite Eyes: No visual changes, double vision, eye pain Ear, nose and throat: No hearing loss, ear pain, nasal congestion, sore throat Cardiovascular: No chest pain, palpitations Respiratory:  No shortness of breath at rest or with exertion.   No wheezes GastrointestinaI: No nausea, vomiting, diarrhea, abdominal pain, fecal incontinence Genitourinary:  No dysuria, urinary retention or frequency.  No nocturia. Musculoskeletal:  No neck pain, back pain Integumentary: No rash, pruritus, skin lesions Neurological: as above Psychiatric: No depression at this time.  No anxiety Endocrine: No palpitations, diaphoresis, change in appetite, change in weigh or increased thirst Hematologic/Lymphatic:  No anemia, purpura, petechiae. Allergic/Immunologic: No itchy/runny eyes, nasal congestion,  recent allergic reactions, rashes  ALLERGIES: Allergies  Allergen Reactions   Penicillins Hives   Sulfonamide Derivatives Hives    HOME MEDICATIONS:  Current Outpatient Medications:    atorvastatin (LIPITOR) 80 MG tablet, Take 1 tablet (80 mg total) by mouth daily., Disp: 30 tablet,  Rfl: 11   carvedilol (COREG) 3.125 MG tablet, Take 1 tablet (3.125 mg total) by mouth 2 (two) times daily with a meal. (Patient taking differently: Take 3.125 mg by mouth daily.), Disp: 60 tablet, Rfl: 11   cetirizine (ZYRTEC) 10 MG tablet, Take 1 tablet (10 mg total) by mouth daily., Disp: 30 tablet, Rfl: 11   hydrOXYzine (VISTARIL) 50 MG capsule, Take 1 capsule (50 mg total) by mouth every 6 (six) hours as needed. (Patient taking differently: Take 50 mg by mouth daily.), Disp: 120 capsule, Rfl: 3   isosorbide mononitrate (IMDUR) 30 MG 24 hr tablet, Take 1 tablet (30 mg total) by mouth daily., Disp: 90 tablet, Rfl: 3   lamoTRIgine (LAMICTAL) 200 MG tablet, Take 1 tablet (200 mg total) by mouth daily., Disp: 30 tablet, Rfl: 3   losartan (COZAAR) 50 MG tablet, Take 1 tablet (50 mg total) by mouth daily., Disp: 30 tablet, Rfl: 11   metFORMIN (GLUCOPHAGE-XR) 500 MG 24 hr tablet, TAKE 1 TABLET BY MOUTH TWICE DAILY WITH MEALS, Disp: 60 tablet, Rfl: 11   nitroGLYCERIN (NITROSTAT) 0.4 MG SL tablet, Place 1 tablet (0.4 mg total) under the tongue every 5 (five) minutes as needed for chest pain., Disp: 25 tablet, Rfl: 1   pantoprazole (PROTONIX) 40 MG tablet, Take 1 tablet by mouth twice daily (Patient taking differently: Take 40 mg by mouth daily.), Disp: 180 tablet, Rfl: 0   QUEtiapine (SEROQUEL) 100 MG tablet, Take 1 tablet (100 mg total) by mouth at bedtime., Disp: 30 tablet, Rfl: 3  PAST MEDICAL HISTORY: Past Medical History:  Diagnosis Date   Anemia    Bipolar disorder (HCC)    Diabetes mellitus (HCC) 2022   type 2   Environmental and seasonal allergies 1968   GERD (gastroesophageal reflux disease)    Heart murmur    Hypertension    Upper GI bleed 01/15/2022    PAST SURGICAL HISTORY: Past Surgical History:  Procedure Laterality Date   BIOPSY  01/17/2022   Procedure: BIOPSY;  Surgeon: Tressia Danas, MD;  Location: [facility] ENDOSCOPY;  Service: Gastroenterology;;   CESAREAN SECTION  1982, 1983,  1990   ESOPHAGOGASTRODUODENOSCOPY (EGD) WITH PROPOFOL N/A 01/17/2022   Procedure: ESOPHAGOGASTRODUODENOSCOPY (EGD) WITH PROPOFOL;  Surgeon: Tressia Danas, MD;  Location: [facility] ENDOSCOPY;  Service: Gastroenterology;  Laterality: N/A;   HERNIA REPAIR     Umbilical hernia in the 2nd grade   LEFT HEART CATH AND CORONARY ANGIOGRAPHY N/A 05/06/2023   Procedure: LEFT HEART CATH AND CORONARY ANGIOGRAPHY;  Surgeon: Tonny Bollman, MD;  Location: [facility] INVASIVE CV LAB;  Service: Cardiovascular;  Laterality: N/A;   TUBAL LIGATION  1990    FAMILY HISTORY: Family History  Problem Relation Age of Onset   Diabetes Mother    Cancer Mother        liver--not clear if this was the primary   COPD Mother    Bipolar disorder Sister    Diabetes Sister    Schizophrenia Brother    Bipolar disorder Brother    Cancer Brother        Not sure of primary   Cancer Maternal Aunt        Breast cancer   Breast cancer Maternal Aunt  Cause of death   Cancer Maternal Aunt        pancreatic   Cancer Maternal Grandfather        throat   Colon cancer Neg Hx    Colon polyps Neg Hx    Esophageal cancer Neg Hx    Stomach cancer Neg Hx    Rectal cancer Neg Hx     SOCIAL HISTORY: Social History   Socioeconomic History   Marital status: Married    Spouse name: Evern Bio   Number of children: 3   Years of education: Not on file   Highest education level: GED or equivalent  Occupational History   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  Was drinking every day prior.  Was drinking due to stress.She and husband drink every evening.  Her  counselor, Page, at [employer] is aware of her drinking.   Drug use: Not Currently    Types: Marijuana, "Crack" cocaine    Comment: Last cocaine use was 30 days ago.  No MJ for 2 months.   Sexual activity: Yes    Birth control/protection: Surgical  Other Topics Concern   Not on file  Social History Narrative   She and her husband live with her son and her granddaughter.    Born and raised in [place]    Social Drivers of Health   Financial Resource Strain: Low Risk  (01/23/2022)   Overall Financial Resource Strain (CARDIA)    Difficulty of Paying Living Expenses: Not very hard  Food Insecurity: No Food Insecurity (11/29/2023)   Hunger Vital Sign    Worried About Running Out of Food in the Last Year: Never true    Ran Out of Food in the Last Year: Never true  Transportation Needs: No Transportation Needs (11/29/2023)   PRAPARE - Administrator, Civil Service (Medical): No    Lack of Transportation (Non-Medical): No  Physical Activity: Not on file  Stress: Not on file  Social Connections: Not on file  Intimate Partner Violence: Not At Risk (11/29/2023)   Humiliation, Afraid, Rape, and Kick questionnaire    Fear of Current or Ex-Partner: No    Emotionally Abused: No    Physically Abused: No    Sexually Abused: No       PHYSICAL EXAM  Vitals:   12/10/23 1423  BP: 108/70  Pulse: 85  Weight: 205 lb (93 kg)  Height: 5\' 2"  (1.575 m)    Body mass index is 37.49 kg/m.   General: The patient is well-developed and well-nourished and in no acute distress  HEENT:  Head is [place]/AT.  Sclera are anicteric.  Funduscopic exam shows normal optic discs and retinal vessels.  Neck: No carotid bruits are noted.  The neck is nontender.  Cardiovascular: The heart has a regular rate and rhythm with a normal S1 and S2. There were no murmurs, gallops or rubs.    Skin: Extremities are without rash or  edema.  Musculoskeletal:  Back is nontender  Neurologic Exam  Mental  status: The patient is alert and oriented x 3  at the time of the examination. The patient has apparent normal recent and remote memory, with an apparently normal attention span and concentration ability.   Speech is normal.  Cranial nerves: Extraocular movements are full. Pupils are equal, round, and reactive to light and accomodation.  Visual fields are full.  Facial symmetry is present. There is good facial sensation to soft touch bilaterally.Facial strength is normal.  Trapezius and sternocleidomastoid strength is normal. No dysarthria is noted.  The tongue is midline, and the patient has symmetric elevation of the soft palate. No obvious hearing deficits are noted.  Motor:  Muscle bulk is normal.   Tone is normal. Strength is  3/5 in deltoid, 4/5 in biceps and 4+/5 in forearm pronators and 5/5 elsewhere in right arm. 5 / 5 in other 3 limbs.   Sensory: Sensory testing is intact to pinprick, soft touch and vibration sensation in all 4 extremities except 50% vibration sensation loss in toes  Coordination: Cerebellar testing reveals good finger-nose-finger and heel-to-shin bilaterally.  Gait and station: Station is normal.   Gait is normal. Tandem gait is slightly wide. Romberg is negative.   Reflexes: Deep tendon reflexes are symmetric and normal bilaterally.   Plantar responses are flexor.    DIAGNOSTIC DATA (LABS, IMAGING, TESTING) - I reviewed patient records, labs, notes, testing and imaging myself where available.  Lab Results  Component Value Date   WBC 8.4 11/29/2023   HGB 14.6 11/29/2023   HCT 45.5 11/29/2023   MCV 88.0 11/29/2023   PLT 306 11/29/2023      Component Value Date/Time   NA 136 11/29/2023 0504   NA 145 (H) 04/30/2023 1651   K 4.0 11/29/2023 0504   CL 105 11/29/2023 0504   CO2 22 11/29/2023 0504   GLUCOSE 132 (H) 11/29/2023 0504   BUN 11 11/29/2023 0504   BUN 14 04/30/2023 1651   CREATININE 0.82 11/29/2023 0504   CALCIUM 9.5 11/29/2023 0504   PROT 7.6  11/28/2023 1028   PROT 6.8 03/19/2023 1617   ALBUMIN 3.9 11/28/2023 2233   AST 23 11/28/2023 1028   ALT 31 11/28/2023 1028   ALKPHOS 89 11/28/2023 1028   BILITOT 0.4 11/28/2023 1028   BILITOT 0.2 03/19/2023 1617   GFRNONAA >60 11/29/2023 0504   GFRAA 86 04/27/2020 1116   Lab Results  Component Value Date   CHOL 155 05/20/2023   HDL 51 05/20/2023   LDLCALC 73 05/20/2023   TRIG 188 (H) 05/20/2023   CHOLHDL 4.3 07/03/2018   Lab Results  Component Value Date   HGBA1C 6.1 (H) 11/29/2023   Lab Results  Component Value Date   VITAMINB12 409 11/29/2023   Lab Results  Component Value Date   TSH 0.828 05/23/2012       ASSESSMENT AND PLAN  Transverse myelitis (HCC)  Right arm weakness  Numbness  Cocaine use   In summary, Cheyenne Fowler is a 57 year old woman who woke up with weakness in the right arm as well as some numbness and gait difficulties and presented to the hospital.  She was found to have a large focus extending from C3-C7 in the cervical spinal cord most worrisome for transverse myelitis (though there was no enhancement).  She has improved quite a bit and currently just has moderate deltoid and mild biceps and forearm weakness.  Sensory symptoms and gait issues have resolved.  Although cocaine induced spinal cord infarction could have a similar appearance (especially since there was no enhancement), recovery to this extent would  not be expected making this diagnosis less likely.  She has improved but is not at baseline.  Hopefully she will improve further over the next couple weeks.  Although she most likely has a monophasic transverse myelitis as the serologic evaluation was negative, I would recommend that we recheck an MRI of the brain and cervical spine in about 8 to 10 months.  She will come back to see me around that time.  She should call sooner if there are new or worsening symptoms.  Thank you for asking me to see Cheyenne Fowler.  Please let me know if I can be of  further assistance with her or other patients in the future.  Cheyenne Fowler A. Epimenio Foot, MD, [facility] 12/10/2023, 6:09 PM Certified in Neurology, Clinical Neurophysiology, Sleep Medicine and Neuroimaging  [facility] [address] [place], [place] [address] [phone]

## 2023-12-09 NOTE — Telephone Encounter (Signed)
Lvm asking to return call

## 2023-12-10 ENCOUNTER — Ambulatory Visit (INDEPENDENT_AMBULATORY_CARE_PROVIDER_SITE_OTHER): Payer: MEDICAID | Admitting: Neurology

## 2023-12-10 ENCOUNTER — Encounter: Payer: Self-pay | Admitting: Neurology

## 2023-12-10 VITALS — BP 108/70 | HR 85 | Ht 62.0 in | Wt 205.0 lb

## 2023-12-10 DIAGNOSIS — G373 Acute transverse myelitis in demyelinating disease of central nervous system: Secondary | ICD-10-CM

## 2023-12-10 DIAGNOSIS — F1490 Cocaine use, unspecified, uncomplicated: Secondary | ICD-10-CM

## 2023-12-10 DIAGNOSIS — R200 Anesthesia of skin: Secondary | ICD-10-CM

## 2023-12-10 DIAGNOSIS — R29898 Other symptoms and signs involving the musculoskeletal system: Secondary | ICD-10-CM | POA: Diagnosis not present

## 2023-12-10 NOTE — Telephone Encounter (Signed)
Patient has been scheduled

## 2023-12-11 ENCOUNTER — Other Ambulatory Visit: Payer: Self-pay

## 2023-12-11 ENCOUNTER — Ambulatory Visit: Payer: MEDICAID

## 2023-12-11 ENCOUNTER — Ambulatory Visit: Payer: MEDICAID | Admitting: Internal Medicine

## 2023-12-11 ENCOUNTER — Encounter: Payer: Self-pay | Admitting: Internal Medicine

## 2023-12-11 ENCOUNTER — Ambulatory Visit: Payer: MEDICAID | Attending: Internal Medicine | Admitting: Occupational Therapy

## 2023-12-11 VITALS — BP 78/55 | HR 80

## 2023-12-11 VITALS — BP 136/78 | HR 94 | Resp 20 | Ht 62.0 in | Wt 205.0 lb

## 2023-12-11 VITALS — BP 106/78 | HR 78

## 2023-12-11 DIAGNOSIS — R208 Other disturbances of skin sensation: Secondary | ICD-10-CM | POA: Diagnosis present

## 2023-12-11 DIAGNOSIS — R32 Unspecified urinary incontinence: Secondary | ICD-10-CM

## 2023-12-11 DIAGNOSIS — M6281 Muscle weakness (generalized): Secondary | ICD-10-CM | POA: Diagnosis present

## 2023-12-11 DIAGNOSIS — I10 Essential (primary) hypertension: Secondary | ICD-10-CM

## 2023-12-11 DIAGNOSIS — R293 Abnormal posture: Secondary | ICD-10-CM | POA: Insufficient documentation

## 2023-12-11 DIAGNOSIS — R2689 Other abnormalities of gait and mobility: Secondary | ICD-10-CM | POA: Insufficient documentation

## 2023-12-11 DIAGNOSIS — G373 Acute transverse myelitis in demyelinating disease of central nervous system: Secondary | ICD-10-CM | POA: Insufficient documentation

## 2023-12-11 DIAGNOSIS — R29818 Other symptoms and signs involving the nervous system: Secondary | ICD-10-CM | POA: Insufficient documentation

## 2023-12-11 DIAGNOSIS — R278 Other lack of coordination: Secondary | ICD-10-CM | POA: Insufficient documentation

## 2023-12-11 DIAGNOSIS — F1990 Other psychoactive substance use, unspecified, uncomplicated: Secondary | ICD-10-CM

## 2023-12-11 DIAGNOSIS — K219 Gastro-esophageal reflux disease without esophagitis: Secondary | ICD-10-CM

## 2023-12-11 DIAGNOSIS — R41844 Frontal lobe and executive function deficit: Secondary | ICD-10-CM | POA: Insufficient documentation

## 2023-12-11 DIAGNOSIS — R350 Frequency of micturition: Secondary | ICD-10-CM

## 2023-12-11 DIAGNOSIS — B9681 Helicobacter pylori [H. pylori] as the cause of diseases classified elsewhere: Secondary | ICD-10-CM

## 2023-12-11 DIAGNOSIS — R768 Other specified abnormal immunological findings in serum: Secondary | ICD-10-CM

## 2023-12-11 LAB — POCT URINALYSIS DIPSTICK
Bilirubin, UA: NEGATIVE
Blood, UA: NEGATIVE
Glucose, UA: NEGATIVE
Ketones, UA: NEGATIVE
Leukocytes, UA: NEGATIVE
Protein, UA: NEGATIVE
Spec Grav, UA: 1.02 (ref 1.010–1.025)
Urobilinogen, UA: 0.2 U/dL
pH, UA: 6 (ref 5.0–8.0)

## 2023-12-11 NOTE — Therapy (Signed)
OUTPATIENT PHYSICAL THERAPY NEURO EVALUATION   [name] MRN: [id number] DOB:[birth year], 57 y.o., female Today's Date: 12/11/2023   PCP: Julieanne Manson, MD REFERRING PROVIDER: Eric [place], DO  END OF SESSION:  PT End of Session - 12/11/23 1311     Visit Number 1    Number of Visits 13    Date for PT Re-Evaluation 02/06/24    Authorization Type [employer]    PT Start Time 1315    PT Stop Time 1357    PT Time Calculation (min) 42 min    Activity Tolerance Treatment limited secondary to medical complications (Comment)   low BP/ dizzy   Behavior During Therapy [facility] for tasks assessed/performed             Past Medical History:  Diagnosis Date   Anemia    Bipolar disorder (HCC)    Diabetes mellitus (HCC) 2022   type 2   Environmental and seasonal allergies 1968   GERD (gastroesophageal reflux disease)    Heart murmur    Hypertension    Upper GI bleed 01/15/2022   Past Surgical History:  Procedure Laterality Date   BIOPSY  01/17/2022   Procedure: BIOPSY;  Surgeon: Tressia Danas, MD;  Location: [facility] ENDOSCOPY;  Service: Gastroenterology;;   CESAREAN SECTION  1982, 1983, 1990   ESOPHAGOGASTRODUODENOSCOPY (EGD) WITH PROPOFOL N/A 01/17/2022   Procedure: ESOPHAGOGASTRODUODENOSCOPY (EGD) WITH PROPOFOL;  Surgeon: Tressia Danas, MD;  Location: [facility] ENDOSCOPY;  Service: Gastroenterology;  Laterality: N/A;   HERNIA REPAIR     Umbilical hernia in the 2nd grade   LEFT HEART CATH AND CORONARY ANGIOGRAPHY N/A 05/06/2023   Procedure: LEFT HEART CATH AND CORONARY ANGIOGRAPHY;  Surgeon: Tonny Bollman, MD;  Location: [facility] INVASIVE CV LAB;  Service: Cardiovascular;  Laterality: N/A;   TUBAL LIGATION  1990   Patient Active Problem List   Diagnosis Date Noted   Acute transverse myelitis (HCC) 11/29/2023   HTN (hypertension) 11/29/2023   Right lumbar radiculopathy 06/02/2023   Plantar fasciitis of right foot 06/02/2023   Primary hypertension  01/23/2022   Acute esophagitis    Gastritis and gastroduodenitis    Hematemesis 01/16/2022   Type 2 diabetes mellitus (HCC) 01/15/2022   Hypertension associated with diabetes (HCC) 01/15/2022   Alcohol use 01/15/2022   Hyperlipidemia associated with type 2 diabetes mellitus (HCC) 01/15/2022   Extravasation of intravenous contrast medium 01/15/2022   Cocaine use disorder, mild, abuse (HCC) 06/15/2020   Bipolar I disorder, most recent episode depressed (HCC) 06/14/2020   Menopausal syndrome 05/30/2020   Bipolar disorder (HCC)    Prediabetes    Tooth infection 08/27/2018   Hemoptysis 08/27/2018   Blood in stool 07/23/2018   Chest pain of uncertain etiology 07/13/2012   Depression 07/13/2012   Tobacco abuse 07/13/2012   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    Class: Acute   Polysubstance (excluding opioids) dependence (HCC) 05/22/2012    Class: Acute   Breast tenderness in female 03/18/2012   Health care maintenance 03/18/2012   DEPRESSION 08/17/2007    ONSET DATE: 12/01/2023   REFERRING DIAG:  G37.3 (ICD-10-CM) - Acute transverse myelitis (HCC)  M79.601 (ICD-10-CM) - Right arm pain  R20.0 (ICD-10-CM) - Numbness  R53.1 (ICD-10-CM) - Weakness    THERAPY DIAG:  Abnormal posture - Plan: PT plan of care cert/re-cert  Muscle weakness (generalized) - Plan: PT plan of care cert/re-cert  Other abnormalities of gait and mobility - Plan: PT plan of care cert/re-cert  Rationale for  Evaluation and Treatment: Rehabilitation  SUBJECTIVE:                                                                                                                                                                                             SUBJECTIVE STATEMENT: Patient arrives to clinic alone. Reports that things have been "stressful" since being out of the hospital. Reports greatest trouble when first waking up. Has had a few near falls because of weakness. Greatest  difficulty with getting in and out of tub/shower, other ADLs. Is able to go up and down the stairs fine, but does endorse fatigue afterward.  Pt accompanied by: self  PERTINENT HISTORY: anemia, bipolar, DM, GERD, HTN, UGI bleed  PAIN:  Are you having pain? Yes: NPRS scale: 2/10 Pain location: R hand Pain description: "hot" Aggravating factors: using her hand Relieving factors: not using it  PRECAUTIONS: Fall  WEIGHT BEARING RESTRICTIONS: No  FALLS: Has patient fallen in last 6 months? Yes. Number of falls 1; after getting out of the hospital fell getting off the toilet, needed help up- did hit her head  LIVING ENVIRONMENT: Lives with: lives with their family Lives in: House/apartment Stairs: Yes: Internal: flight steps; on right going up and External: 2 steps; none Has following equipment at home: Grab bars  PLOF: Independent  PATIENT GOALS: "to not fall"  OBJECTIVE:  Note: Objective measures were completed at Evaluation unless otherwise noted.  DIAGNOSTIC FINDINGS: 11/28/23 cspine MRI Narrative & Impression  CLINICAL DATA:  Right arm pain, numbness and weakness   EXAM: MRI CERVICAL SPINE WITHOUT AND WITH CONTRAST   TECHNIQUE: Multiplanar and multiecho pulse sequences of the cervical spine, to include the craniocervical junction and cervicothoracic junction, were obtained without and with intravenous contrast.   CONTRAST:  10mL GADAVIST GADOBUTROL 1 MMOL/ML IV SOLN   COMPARISON:  None Available.   FINDINGS: Alignment: Physiologic.   Vertebrae: No fracture, evidence of discitis, or bone lesion.   Cord: There is hyperintense T2-weighted signal within the ventral spinal cord, asymmetric to the right at the C3-6 levels. No abnormal contrast enhancement.   Posterior Fossa, vertebral arteries, paraspinal tissues: Negative.   Disc levels:   C1-2: Unremarkable.   C2-3: Normal disc space and facet joints. There is no spinal canal stenosis. No neural foraminal  stenosis.   C3-4: Intermediate sized disc osteophyte complex. There is no spinal canal stenosis. Mild bilateral neural foraminal stenosis.   C4-5: Normal disc space and facet joints. There is no spinal canal stenosis. No neural foraminal stenosis.   C5-6: Small disc bulge. There is no spinal canal stenosis. No neural foraminal stenosis.  C6-7: Left asymmetric disc bulge with bilateral uncovertebral hypertrophy. Mild spinal canal stenosis. Severe left neural foraminal stenosis.   C7-T1: Normal disc space and facet joints. There is no spinal canal stenosis. No neural foraminal stenosis.   IMPRESSION: 1. Hyperintense T2-weighted signal within the ventral spinal cord, asymmetric to the right at the C3-6 levels. This is most consistent with transverse myelitis or other nonspecific demyelinating process. 2. Mild spinal canal stenosis and severe left neural foraminal stenosis at C6-7.    COGNITION: Overall cognitive status: Within functional limits for tasks assessed   SENSATION: Light touch: Impaired  Hot/Cold: Impaired   COORDINATION: Dysmetric R LE heel shin and figure 8   POSTURE: rounded shoulders and forward head   LOWER EXTREMITY MMT:    MMT Right Eval Left Eval  Hip flexion 3+ 5  Hip extension    Hip abduction 5 5  Hip adduction 5 5  Hip internal rotation    Hip external rotation    Knee flexion 4 5  Knee extension 3+ 5  Ankle dorsiflexion 4- 4  Ankle plantarflexion    Ankle inversion    Ankle eversion    (Blank rows = not tested)  BED MOBILITY:  Endorses some difficulty   TRANSFERS: Assistive device utilized: None  Sit to stand: SBA Stand to sit: SBA Chair to chair: SBA   GAIT: Gait pattern: step through pattern, decreased arm swing- Right, decreased step length- Left, decreased stance time- Right, decreased ankle dorsiflexion- Right, Right foot flat, decreased trunk rotation, wide BOS, and poor foot clearance- Right Distance walked:  clinic Assistive device utilized: None Level of assistance: SBA Comments: felt light headed  FUNCTIONAL TESTS:  Limited by low BP (unable to stand)   PATIENT SURVEYS:  ABC scale 37%                                                                                                                              TREATMENT N/A eval   PATIENT EDUCATION: Education details: PT POC, BP parameters, exam findings Person educated: Patient Education method: Explanation Education comprehension: verbalized understanding and needs further education  HOME EXERCISE PROGRAM: To be provided  GOALS: Goals reviewed with patient? Yes  SHORT TERM GOALS: Target date: 01/02/24  Pt will be independent with initial HEP for improved functional strength and endurance  Baseline: to be provided Goal status: INITIAL  2.  Patient will improve ABC score to >/= 45% to demonstrate improved confidence with functional mobility  Baseline: 37% Goal status: INITIAL  3.  TUG goal Baseline: to be assessed Goal status: INITIAL  4.  5xSTS goal  Baseline: to be assessed Goal status: INITIAL  5.  goal Baseline: to be assessed Goal status: INITIAL   LONG TERM GOALS: Target date: 02/06/24  Pt will be independent with final HEP for improved functional strength and endurance  Baseline: to be provided Goal status: INITIAL  Patient will improve ABC score to >/= 55%  to demonstrate improved confidence with functional mobility  Baseline: 37% Goal status: INITIAL  TUG goal Baseline: to be assessed Goal status: INITIAL  4.  5xSTS goal  Baseline: to be assessed Goal status: INITIAL  5.   goal Baseline: to be assessed Goal status: INITIAL  ASSESSMENT:  CLINICAL IMPRESSION: Patient is a 57 y.o. female who was seen today for physical therapy evaluation and treatment for impaired mobility and weakness follow acute transverse myelitis. Eval was significantly limited by low blood pressure with patient  reports of dizziness/feeling light headed. She scored a 37% on the ABC questionnaire, suggesting very low confidence in maintaining her balance while completing everyday tasks. MMT scores reflect R hemiparesis. She would benefit from skilled PT services to address the above mentioned deficits.   OBJECTIVE IMPAIRMENTS: Abnormal gait, decreased activity tolerance, decreased balance, decreased coordination, decreased endurance, decreased knowledge of condition, decreased knowledge of use of DME, difficulty walking, decreased strength, dizziness, impaired sensation, impaired UE functional use, postural dysfunction, and pain.   ACTIVITY LIMITATIONS: carrying, lifting, standing, squatting, stairs, transfers, bed mobility, bathing, dressing, locomotion level, and caring for others  PARTICIPATION LIMITATIONS: meal prep, cleaning, interpersonal relationship, driving, shopping, community activity, and occupation  PERSONAL FACTORS: Age, Behavior pattern, Past/current experiences, Social background, Time since onset of injury/illness/exacerbation, Transportation, and 3+ comorbidities: see above  are also affecting patient's functional outcome.   REHAB POTENTIAL: Good  CLINICAL DECISION MAKING: Stable/uncomplicated  EVALUATION COMPLEXITY: Low  PLAN:  PT FREQUENCY: 2x/week  PT DURATION: 6 weeks  PLANNED INTERVENTIONS: 97164- PT Re-evaluation, 97110-Therapeutic exercises, 97530- Therapeutic activity, 97112- Neuromuscular re-education, 97535- Self Care, [id number]- Manual therapy, [phone]- Gait training, [phone]- Orthotic Fit/training, [phone]- Aquatic Therapy, Patient/Family education, Balance training, Stair training, Dry Needling, Vestibular training, Visual/preceptual remediation/compensation, and DME instructions  PLAN FOR NEXT SESSION: , 5xSTS, TUG, HEP   Westley Foots, PT Westley Foots, PT, DPT, CBIS  12/11/2023, 3:37 PM     For all possible CPT codes, reference the Planned Interventions  line above.     Check all conditions that are expected to impact treatment: {Conditions expected to impact treatment:Diabetes mellitus, Neurological condition and/or seizures, Psychological or psychiatric disorders, and Social determinants of health   If treatment provided at initial evaluation, no treatment charged due to lack of authorization.

## 2023-12-11 NOTE — Progress Notes (Signed)
Subjective:    Patient ID: Cheyenne Fowler, female   DOB: 09/16/1966, 57 y.o.   MRN: [id number]   HPI  Transverse Myelitis affecting right side felt to be most likely diagnosis for symptoms and MR findings.  She did respond also to 5 days of IV corticosteroids. She is doing significantly better with mild weakness in RUE and RLE.  Still with mild numbness and tingling of fingers and foot.  Had follow up with Neuro, Dr. Epimenio Foot, yesterday.  She may have had a GI diarrheal illness prior to onset of her neurologic symptoms--perhaps a month prior.  Everyone in household has had URI symptoms prior to her symptoms, though she did not have these symptoms.    Work up for her right sided symptoms/transverse myelitis other than MR of spine was normal, save for RF, which was elevated.  Do not see confirmatory testing.  She really does not have signs or symptoms of RA--no definite symmetric swelling and redness of any joints.    Urinary frequency and incontinence since in hospital.  Did not have a catheter while in hospital.  No suprapubic pain, no flank pain.  No dysuria.    Urine drug screen + for opiates and cocaine.  Was using cocaine as stressed with her daughter's partner leaving her.  Daughter's partner had the home in her name, so with the split, they all (patient, husband, daughter and grandchildren)had to find new housing.  She used a friend's hydrocodone for a headache.  Uses crack cocaine 2-3 times monthly.     Current Meds  Medication Sig   atorvastatin (LIPITOR) 80 MG tablet Take 1 tablet (80 mg total) by mouth daily.   carvedilol (COREG) 3.125 MG tablet Take 1 tablet (3.125 mg total) by mouth 2 (two) times daily with a meal. (Patient taking differently: Take 3.125 mg by mouth daily.)   cetirizine (ZYRTEC) 10 MG tablet Take 1 tablet (10 mg total) by mouth daily.   hydrOXYzine (VISTARIL) 50 MG capsule Take 1 capsule (50 mg total) by mouth every 6 (six) hours as needed. (Patient taking  differently: Take 50 mg by mouth daily.)   lamoTRIgine (LAMICTAL) 200 MG tablet Take 1 tablet (200 mg total) by mouth daily.   losartan (COZAAR) 50 MG tablet Take 1 tablet (50 mg total) by mouth daily.   metFORMIN (GLUCOPHAGE-XR) 500 MG 24 hr tablet TAKE 1 TABLET BY MOUTH TWICE DAILY WITH MEALS   nitroGLYCERIN (NITROSTAT) 0.4 MG SL tablet Place 1 tablet (0.4 mg total) under the tongue every 5 (five) minutes as needed for chest pain.   pantoprazole (PROTONIX) 40 MG tablet Take 1 tablet by mouth twice daily (Patient taking differently: Take 40 mg by mouth daily.)   QUEtiapine (SEROQUEL) 100 MG tablet Take 1 tablet (100 mg total) by mouth at bedtime.   Allergies  Allergen Reactions   Penicillins Hives   Sulfonamide Derivatives Hives     Review of Systems    Objective:   BP 136/78 (BP Location: Left Arm, Patient Position: Sitting, Cuff Size: Normal)   Pulse 94   Resp 20   Ht 5\' 2"  (1.575 m)   Wt 205 lb (93 kg)   LMP 06/09/2018 (Exact Date) Comment: Bleeding one month ago.  BMI 37.49 kg/m   Physical Exam NAD HEENT:  PERRL EOMI Neck:  Supple, No adenopathy Chest:  CTA CV:  RRR without murmur or rub.  No carotid bruits.  Carotid, radial and DP pulses normal and equal. Abd:  No  flank or suprapubic tenderness.  + BS, No HSM or mass.   MS:  no obvious erythema, synovial thickening or swelling of any joints.   Assessment & Plan   Transverse myelitis:  work up thus far without specific etiology.  As RF +, will check CCP antibodies.    2.  Urinary frequency and incontinence:  Check UA and culture.  No findings on exam.    3.  Crack Cocaine use disorder and using other's opiates:  encouraged her to work with our LCSWA to address for treatment.    4.  By the way:  would like to have pantoprazole refilled as having GERD symptoms--filled.

## 2023-12-11 NOTE — Therapy (Signed)
OUTPATIENT OCCUPATIONAL THERAPY NEURO EVALUATION  [name] MRN: [id number] DOB:July 16, 1966, 57 y.o., female Today's Date: 12/11/2023  PCP: Julieanne Manson, MD  REFERRING PROVIDER: [place], Eric J, DO   END OF SESSION:  OT End of Session - 12/11/23 1509     Visit Number 1    Number of Visits 13   including evaluation   Date for OT Re-Evaluation 02/06/24    Authorization Type [employer]    OT Start Time 1400    OT Stop Time 1447    OT Time Calculation (min) 47 min    Activity Tolerance Patient tolerated treatment well    Behavior During Therapy [facility] for tasks assessed/performed             Past Medical History:  Diagnosis Date   Anemia    Bipolar disorder (HCC)    Diabetes mellitus (HCC) 2022   type 2   Environmental and seasonal allergies 1968   GERD (gastroesophageal reflux disease)    Heart murmur    Hypertension    Upper GI bleed 01/15/2022   Past Surgical History:  Procedure Laterality Date   BIOPSY  01/17/2022   Procedure: BIOPSY;  Surgeon: Tressia Danas, MD;  Location: [facility] ENDOSCOPY;  Service: Gastroenterology;;   CESAREAN SECTION  1982, 1983, 1990   ESOPHAGOGASTRODUODENOSCOPY (EGD) WITH PROPOFOL N/A 01/17/2022   Procedure: ESOPHAGOGASTRODUODENOSCOPY (EGD) WITH PROPOFOL;  Surgeon: Tressia Danas, MD;  Location: [facility] ENDOSCOPY;  Service: Gastroenterology;  Laterality: N/A;   HERNIA REPAIR     Umbilical hernia in the 2nd grade   LEFT HEART CATH AND CORONARY ANGIOGRAPHY N/A 05/06/2023   Procedure: LEFT HEART CATH AND CORONARY ANGIOGRAPHY;  Surgeon: Tonny Bollman, MD;  Location: [facility] INVASIVE CV LAB;  Service: Cardiovascular;  Laterality: N/A;   TUBAL LIGATION  1990   Patient Active Problem List   Diagnosis Date Noted   Acute transverse myelitis (HCC) 11/29/2023   HTN (hypertension) 11/29/2023   Right lumbar radiculopathy 06/02/2023   Plantar fasciitis of right foot 06/02/2023   Primary hypertension 01/23/2022   Acute  esophagitis    Gastritis and gastroduodenitis    Hematemesis 01/16/2022   Type 2 diabetes mellitus (HCC) 01/15/2022   Hypertension associated with diabetes (HCC) 01/15/2022   Alcohol use 01/15/2022   Hyperlipidemia associated with type 2 diabetes mellitus (HCC) 01/15/2022   Extravasation of intravenous contrast medium 01/15/2022   Cocaine use disorder, mild, abuse (HCC) 06/15/2020   Bipolar I disorder, most recent episode depressed (HCC) 06/14/2020   Menopausal syndrome 05/30/2020   Bipolar disorder (HCC)    Prediabetes    Tooth infection 08/27/2018   Hemoptysis 08/27/2018   Blood in stool 07/23/2018   Chest pain of uncertain etiology 07/13/2012   Depression 07/13/2012   Tobacco abuse 07/13/2012   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    Class: Acute   Polysubstance (excluding opioids) dependence (HCC) 05/22/2012    Class: Acute   Breast tenderness in female 03/18/2012   Health care maintenance 03/18/2012   DEPRESSION 08/17/2007    ONSET DATE: 12/01/2023  (referral date)  REFERRING DIAG:  G37.3 (ICD-10-CM) - Acute transverse myelitis (HCC)  R29.898 (ICD-10-CM) - Right arm weakness  R20.0 (ICD-10-CM) - Numbness  R53.1 (ICD-10-CM) - Weakness    THERAPY DIAG:  Other lack of coordination  Muscle weakness (generalized)  Other disturbances of skin sensation  Other symptoms and signs involving the nervous system  Rationale for Evaluation and Treatment: Rehabilitation  SUBJECTIVE:   SUBJECTIVE STATEMENT: Pt reported  difficulty with getting in/out of tub. Pt reported difficulty with showering tasks d/t recent LOB episode therefore reported "I'm scared."   Pt reported not having shower chair currently.  Pt reported R arm is still weak and unable to complete shoulder flexion. Pt reported difficulty with picking up objects.  Pt denied dizziness symptoms and other symptoms of low BP when seated. Pt reported dizziness only occurs when  standing.  Pt accompanied by: self  PERTINENT HISTORY: cocaine use, NIDDM, PTSD/anxiety, HTN, hyperlipidemia, CAD, DM II, bipolar disorder, GERD  Per 12/11/23 MD Progress Notes: "onset of right arm weakness and some right arm pain when she woke up November 26, 2023.  She also had right arm and foot numbness.  Symptoms persisted so she went to the ED.   She was positive for cocaine in her urine tox screen but states she had not used it within a week of the event...  Imaging: MRI of the head 11/28/2023 was normal for age showing a couple T2/FLAIR hyperintense foci in the subcortical or deep white matter consistent with mild chronic microvascular ischemic change.   MRI of the cervical spine 11/28/2023 showed a T2 hyperintense focus from C2-C3 to C6.  It was central but more to the right adjacent to C3.  Central gray may have been more involved adjacent to C5.  The focus did not enhance.  Mild spinal stenosis moderately severe left foraminal narrowing is noted at C6-C7.  Foraminal narrowing noted at C3-C4.  Potential for right C4 and left C7 nerve root compression.   MRI of the thoracic spine 11/30/2023 was normal.   MRI of the brachial plexus 11/28/2023 (report) was normal."  PRECAUTIONS: Fall  WEIGHT BEARING RESTRICTIONS: No  PAIN:  Are you having pain? Yes: NPRS scale: 6/10 Pain location: R shoulder, R arm, L side of neck Pain description: aching Aggravating factors: turning head to L side, trying to lift coffee cup with R hand Relieving factors: Gabapentin  FALLS: Has patient fallen in last 6 months? Yes. Number of falls 1, per pt  Pt fell 2 days ago trying to get off toilet d/t arm slipped.  Pt reported LOB episode when showering.     LIVING ENVIRONMENT: Lives with: lives with their family and lives with their spouse Lives in: House/apartment Stairs: Yes: Internal: 12 steps; on right going up and External: 2 steps; none Has following equipment at home: Grab bars  PLOF:  Independent with basic ADLs, household management (cleaning, sweeping, laundry), grocery shopping, driving  PATIENT GOALS: "getting better and being able to lift my arm as much as possible... being able to not feel numbness in my hand... being able to stand up and sit down without being dizzy.  OBJECTIVE:  Note: Objective measures were completed at Evaluation unless otherwise noted.  HAND DOMINANCE: Right  ADLs: Overall ADLs: ind with L hand compensation Transfers/ambulation related to ADLs: Eating: ind with L hand compensating Grooming: ind with L hand compensating, assistance for styling hair UB Dressing: assistance from family members, unable to get sleeve over R shoulder LB Dressing: assistance from family members Toileting: ind wiping, assistance from family members Bathing: sponge bathing, pt reported concerns about safety with showering d/t falls Tub Shower transfers: unable to get in/out of shower Equipment:  tub/shower  IADLs: Shopping: daughter orders groceries Light housekeeping: family assistance, pt completes laundry seated Meal Prep: family assistance Community mobility: family assistance and case manager assistance for attending medical appointments Handwriting: 100% legible, Pt reported handwriting looks different and much  slower since onset of R arm weakness.  Pt required more than 1 minute to write x1 simple sentence (5 word sentence).  MOBILITY STATUS: Hx of falls and ambulates without A/E during eval  POSTURE COMMENTS:  forward head Sitting balance:  ind  ACTIVITY TOLERANCE: Activity tolerance: Pt reported increased fatigue since symptoms started, especially during mid-afternoon.  FUNCTIONAL OUTCOME MEASURES: Upper Extremity Functional Index (UEFI): 13    UPPER EXTREMITY ROM:    Active ROM Right eval Left eval  Shoulder flexion 40* (PROM - WFL) WFL  Shoulder abduction    Shoulder adduction 50* (PROM - WFL)   Shoulder extension    Shoulder  internal rotation    Shoulder external rotation    Elbow flexion 105* (PROM - WFL)   Elbow extension WFL   Wrist flexion WFL   Wrist extension WFL   Wrist ulnar deviation    Wrist radial deviation    Wrist pronation WFL   Wrist supination WFL though decreased compared to LUE   (Blank rows = not tested)  Pt demo'd slow movements of RUE during AROM.  Pt demo'd significant compensatory muscle recruitment at R shoulder when attempting R shoulder flex/ABD. Pt required mod v/c to relax arm for PROM shoulder flex/ABD d/t pt potentially anticipating pain. Pt reported no pain with PROM shoulder flex/ABD though some pain when pt activated shoulder muscles in anticipatory muscle recruitment despite v/c to relax arm.  HAND FUNCTION: Grip strength: Right: 39.9 lbs without dynamometer supported on tabletop (46.6 with dynamometer supported on tabletop); Left: 46.2 lbs  COORDINATION: 9 Hole Peg test: Right: 43 sec; Left: 30 sec  SENSATION: Numbness and tingling of R fingers, digits 2-5 (excluding thumb).  EDEMA: No swelling noted  MUSCLE TONE: RUE: Within functional limits and LUE: Within functional limits  COGNITION: Overall cognitive status:  able to answer questions appropriately. Pt reported difficulty remembering things, such as preparing tea then forgetting that tea was preparing and needing to complete tasks immediately or forgetting.  ?Possible poor historian - Pt sometimes demo'd inconsistent reporting of recent events possibly d/t forgetting though pt confirmed events occurred if OT mentioned an event.  VISION: Subjective report: Pt reported no changes to vision Baseline vision: Wears glasses for reading only   PERCEPTION: Not tested  PRAXIS: Not tested  OBSERVATIONS:   Pt was pleasant. Pt ambulated ind without A/E. Pt leaned heavily to L side on L arm when using R hand for functional tasks (e.g. when completing 9-hole peg test).    TODAY'S TREATMENT:                                                                                                                               DATE:   Eval only.   OT assessed pt's vitals throughout OT session. See vital signs. Pt's BP improved as session progressed. All OT eval tasks completed in seated position.  OT educated pt on managing low BP, BP therapeutic parameters,  OT role, and OT POC. Pt verbalized understanding of all.  PATIENT EDUCATION: Education details: OT role, POC, see today's tx above Person educated: Patient Education method: Explanation Education comprehension: verbalized understanding  HOME EXERCISE PROGRAM: TBD   GOALS: Goals reviewed with patient? Yes  SHORT TERM GOALS: Target date: 01/09/24  Patient will demonstrate initial RUE HEP with 25% verbal cues or less for proper execution.  Baseline: new to outpt OT Goal status: INITIAL  2.  Patient will demonstrate at least 5 lbs increase in RUE grip strength, unsupported on tabletop, as needed to open jars and other containers.  Baseline: Grip strength: Right: 39.9 lbs without dynamometer supported on tabletop; Left: 46.2 lbs Goal status: INITIAL  3.  Patient will demo improved FM coordination as evidenced by completing nine-hole peg with use of RUE in 37 seconds or less.  Baseline: 9 Hole Peg test: Right: 43 sec; Left: 30 sec Goal status: INITIAL  4.  Pt will verbalize understanding of at least 3 memory compensation strategies and activities to keep thinking skills sharp. Baseline: new to outpt OT, pt reported difficulty with memory since symptoms began Goal status: INITIAL  5.  Pt will demo improved AROM of R shoulder as evidenced by shoulder flex and shoulder ABD to 70*. Baseline:  R Shoulder: 40* flex, 50* ABD Goal status: INITIAL  6.  Pt will report incorporation of RUE for functional tasks, including sweeping and folding laundry in seated position PRN. Baseline: Pt reported primarily using LUE to compensate for daily tasks and completing  laundry tasks while seated. Goal status: INITIAL  7. Pt will independently recall at least 3 sensory precautions to increase safety d/t RUE numbness. Baseline: new to outpt OT Goal status: INITIAL  LONG TERM GOALS: Target date: 02/06/24  Patient will demonstrate updated RUE HEP with 25% verbal cues or less for proper execution.  Baseline: new to outpt OT Goal status: INITIAL  2.  Pt will report at least 9 point increase with UEFI score (reporting at least 22 points) indicating significantly improved functional use of affected UE. Baseline: UEFI - 13 Goal status: INITIAL  3.   Patient will demo improved FM coordination as evidenced by completing nine-hole peg with use of RUE in 32 seconds or less.  Baseline: 9 Hole Peg test: Right: 43 sec; Left: 30 sec Goal status: INITIAL  4.  Pt will demo improved coordination for handwriting as evidenced by writing simple sentence (no more than 5 words) in 40 seconds or less. Baseline: Pt demo'd 100% legibility though required more than 1 minute to write simple 5-word sentence. Pt reported handwriting is slower and not the same as before onset of symptoms. Goal status: INITIAL  5.  Pt will verbalize understanding of A/E for bathing tasks, including shower chair and tub/transfer bench, to reduce fall risk and improve ind for showering tasks. Baseline: pt does not have shower chair or shower bench Goal status: INITIAL  6.  Pt will demo understanding of adaptive strategies and A/E for UB dressing, LB dressing, and bathing tasks. Baseline: Pt reported difficulty with UB dressing, LB dressing, and bathing tasks. Pt reported currently sponge-bathing and requiring assistance for dressing tasks. Goal status: INITIAL  ASSESSMENT:  CLINICAL IMPRESSION: Patient is a 57 y.o. female who was seen today for occupational therapy evaluation for acute transverse myelitis, R arm weakness, numbness, weakness. Hx includes cocaine use, NIDDM, PTSD/anxiety, HTN,  hyperlipidemia, CAD, DM II, bipolar disorder, GERD. Patient currently presents below baseline level of functioning demonstrating functional  deficits and impairments as noted below. Pt would benefit from skilled OT services in the outpatient setting to work on impairments as noted below to increase ind for functional tasks, improve RUE strength and coordination, to work towards improved AROM of RUE, and to promote participation in ADL/IADL tasks.  PERFORMANCE DEFICITS: in functional skills including ADLs, IADLs, coordination, dexterity, proprioception, sensation, ROM, strength, pain, flexibility, Fine motor control, Gross motor control, mobility, balance, body mechanics, endurance, decreased knowledge of use of DME, and UE functional use, cognitive skills including energy/drive, memory, and sequencing, and psychosocial skills including environmental adaptation.   IMPAIRMENTS: are limiting patient from ADLs, IADLs, work, leisure, and social participation.   CO-MORBIDITIES: may have co-morbidities  that affects occupational performance. Patient will benefit from skilled OT to address above impairments and improve overall function.  MODIFICATION OR ASSISTANCE TO COMPLETE EVALUATION: Min-Moderate modification of tasks or assist with assess necessary to complete an evaluation.  OT OCCUPATIONAL PROFILE AND HISTORY: Problem focused assessment: Including review of records relating to presenting problem.  CLINICAL DECISION MAKING: LOW - limited treatment options, no task modification necessary  REHAB POTENTIAL: Good  EVALUATION COMPLEXITY: Low    PLAN:  OT FREQUENCY: 2x/week  OT DURATION: 6 weeks (dates extended to allow for scheduling)  PLANNED INTERVENTIONS: 97168 OT Re-evaluation, 97535 self care/ADL training, 21308 therapeutic exercise, 97530 therapeutic activity, 97112 neuromuscular re-education, 97140 manual therapy, 97035 ultrasound, 97018 paraffin, 65784 fluidotherapy, 97032 electrical  stimulation (manual), 97014 electrical stimulation unattended, 97760 Orthotics management and training, 69629 Splinting (initial encounter), [id number] Subsequent splinting/medication, manual lymph drainage, passive range of motion, functional mobility training, energy conservation, patient/family education, and DME and/or AE instructions  RECOMMENDED OTHER SERVICES: PT eval already completed  CONSULTED AND AGREED WITH PLAN OF CARE: Patient  PLAN FOR NEXT SESSION:  Discuss shower chair and tub transfer bench options - let pt know that pt can call insurance to ask about coverage of DME  Handout - memory compensation strategies and activities to keep thinking skills sharp  Handout - sensory safety d/t numb R fingers  Initiate AAROM affected shoulder HEP (towel slides)  Initiate FM coordination HEP  Handwriting practice and adaptive strategies  UB and LB dressing adaptive strategies and A/E     For all possible CPT codes, reference the Planned Interventions line above.     Check all conditions that are expected to impact treatment: {Conditions expected to impact treatment:Neurological condition and/or seizures   If treatment provided at initial evaluation, no treatment charged due to lack of authorization.     med   Wynetta Emery, OT 12/11/2023, 4:41 PM

## 2023-12-12 LAB — OLIGOCLONAL BANDS, CSF + SERM

## 2023-12-12 MED ORDER — PANTOPRAZOLE SODIUM 40 MG PO TBEC: 40.0000 mg | DELAYED_RELEASE_TABLET | Freq: Two times a day (BID) | ORAL | 0 refills | Status: DC

## 2023-12-12 MED ORDER — LANCETS MISC. MISC: 11 refills | Status: AC

## 2023-12-12 MED ORDER — CLEVER CHOICE BP MONITOR/ARM DEVI: 0 refills | Status: AC

## 2023-12-12 MED ORDER — BLOOD GLUCOSE TEST VI STRP: ORAL_STRIP | 11 refills | Status: DC

## 2023-12-14 ENCOUNTER — Encounter: Payer: Self-pay | Admitting: Internal Medicine

## 2023-12-16 ENCOUNTER — Telehealth: Payer: Self-pay | Admitting: Internal Medicine

## 2023-12-16 LAB — ANTI-CCP AB, IGG + IGA (RDL): Anti-CCP Ab, IgG + IgA (RDL): <20 U (ref <20)

## 2023-12-16 MED ORDER — CIPROFLOXACIN HCL 500 MG PO TABS: ORAL_TABLET | ORAL | 0 refills | Status: DC

## 2023-12-16 NOTE — Telephone Encounter (Signed)
Called and spoke with [facility]--still no result from urine culture, but sounds like the culture is growing something and they just have not resulted the sensitivities. Sending in Rx for Cipro to [facility] on [address] Left message with patient.  Also that confirmatory testing for + RF was negative for RA.

## 2023-12-17 LAB — URINE CULTURE

## 2023-12-22 ENCOUNTER — Ambulatory Visit: Payer: MEDICAID

## 2023-12-22 ENCOUNTER — Ambulatory Visit: Payer: MEDICAID | Admitting: Occupational Therapy

## 2023-12-22 VITALS — BP 137/87 | HR 72

## 2023-12-22 DIAGNOSIS — R278 Other lack of coordination: Secondary | ICD-10-CM | POA: Diagnosis not present

## 2023-12-22 DIAGNOSIS — R2689 Other abnormalities of gait and mobility: Secondary | ICD-10-CM

## 2023-12-22 DIAGNOSIS — M6281 Muscle weakness (generalized): Secondary | ICD-10-CM

## 2023-12-22 DIAGNOSIS — R208 Other disturbances of skin sensation: Secondary | ICD-10-CM

## 2023-12-22 DIAGNOSIS — R29818 Other symptoms and signs involving the nervous system: Secondary | ICD-10-CM

## 2023-12-22 DIAGNOSIS — R293 Abnormal posture: Secondary | ICD-10-CM

## 2023-12-22 NOTE — Patient Instructions (Addendum)
      Safety considerations for loss of sensation:   Look at affected hand when using it!   Be careful when using affected arm for anything: sharp, hot, breakable, or too heavy. Always check temperature of water (for showering, washing dishes, etc) with UNaffected arm/extremity Consider travel mugs w/ lids to transport hot liquids/coffee  Avoid cold temperatures as well (wear glove in cold temperatures, get ice w/ unaffected extremity)  AVOID handling chemicals and machinery   Consider alternative options and/or adaptive equipment to make things safer (ex: hand chopper or cut resistant glove for chopping vegetables)

## 2023-12-22 NOTE — Therapy (Signed)
OUTPATIENT PHYSICAL THERAPY NEURO TREATMENT   [name] MRN: [id number] DOB:1966-02-16, 57 y.o., female Today's Date: 12/23/2023   PCP: Julieanne Manson, MD REFERRING PROVIDER: Eric [place], DO  END OF SESSION:  PT End of Session - 12/22/23 1445     Visit Number 2    Number of Visits 13    Date for PT Re-Evaluation 02/06/24    Authorization Type [employer]    PT Start Time 1532   received from OT   PT Stop Time 1610    PT Time Calculation (min) 38 min    Activity Tolerance Patient tolerated treatment well    Behavior During Therapy [facility] for tasks assessed/performed             Past Medical History:  Diagnosis Date   Anemia    Bipolar disorder (HCC)    Diabetes mellitus (HCC) 2022   type 2   Environmental and seasonal allergies 1968   GERD (gastroesophageal reflux disease)    Heart murmur    Hypertension    Upper GI bleed 01/15/2022   Past Surgical History:  Procedure Laterality Date   BIOPSY  01/17/2022   Procedure: BIOPSY;  Surgeon: Tressia Danas, MD;  Location: [facility] ENDOSCOPY;  Service: Gastroenterology;;   CESAREAN SECTION  1982, 1983, 1990   ESOPHAGOGASTRODUODENOSCOPY (EGD) WITH PROPOFOL N/A 01/17/2022   Procedure: ESOPHAGOGASTRODUODENOSCOPY (EGD) WITH PROPOFOL;  Surgeon: Tressia Danas, MD;  Location: [facility] ENDOSCOPY;  Service: Gastroenterology;  Laterality: N/A;   HERNIA REPAIR     Umbilical hernia in the 2nd grade   LEFT HEART CATH AND CORONARY ANGIOGRAPHY N/A 05/06/2023   Procedure: LEFT HEART CATH AND CORONARY ANGIOGRAPHY;  Surgeon: Tonny Bollman, MD;  Location: [facility] INVASIVE CV LAB;  Service: Cardiovascular;  Laterality: N/A;   TUBAL LIGATION  1990   Patient Active Problem List   Diagnosis Date Noted   Acute transverse myelitis (HCC) 11/29/2023   HTN (hypertension) 11/29/2023   Right lumbar radiculopathy 06/02/2023   Plantar fasciitis of right foot 06/02/2023   Primary hypertension 01/23/2022   Acute esophagitis     Gastritis and gastroduodenitis    Hematemesis 01/16/2022   Type 2 diabetes mellitus (HCC) 01/15/2022   Hypertension associated with diabetes (HCC) 01/15/2022   Alcohol use 01/15/2022   Hyperlipidemia associated with type 2 diabetes mellitus (HCC) 01/15/2022   Extravasation of intravenous contrast medium 01/15/2022   Cocaine use disorder, mild, abuse (HCC) 06/15/2020   Bipolar I disorder, most recent episode depressed (HCC) 06/14/2020   Menopausal syndrome 05/30/2020   Bipolar disorder (HCC)    Prediabetes    Tooth infection 08/27/2018   Hemoptysis 08/27/2018   Blood in stool 07/23/2018   Chest pain of uncertain etiology 07/13/2012   Depression 07/13/2012   Tobacco abuse 07/13/2012   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    Class: Acute   Polysubstance (excluding opioids) dependence (HCC) 05/22/2012    Class: Acute   Breast tenderness in female 03/18/2012   Health care maintenance 03/18/2012   DEPRESSION 08/17/2007    ONSET DATE: 12/01/2023   REFERRING DIAG:  G37.3 (ICD-10-CM) - Acute transverse myelitis (HCC)  M79.601 (ICD-10-CM) - Right arm pain  R20.0 (ICD-10-CM) - Numbness  R53.1 (ICD-10-CM) - Weakness    THERAPY DIAG:  Abnormal posture  Muscle weakness (generalized)  Other abnormalities of gait and mobility  Other lack of coordination  Rationale for Evaluation and Treatment: Rehabilitation  SUBJECTIVE:  SUBJECTIVE STATEMENT: Patient drove herself to the clinic. Reports doing much better. Leg is essentially at baseline, but arm is lagging behind (seeing OT). Denies falls. Was able to go up and down the stairs to do her laundry.  Pt accompanied by: self  PERTINENT HISTORY: anemia, bipolar, DM, GERD, HTN, UGI bleed  PAIN:  Are you having pain?  Yes: NPRS scale: 2/10 Pain location: R hand Pain description: "hot" Aggravating factors: using her hand Relieving factors: not using it  PRECAUTIONS: Fall   PATIENT GOALS: "to not fall"  OBJECTIVE:  Note: Objective measures were completed at Evaluation unless otherwise noted.  DIAGNOSTIC FINDINGS: 11/28/23 cspine MRI Narrative & Impression  CLINICAL DATA:  Right arm pain, numbness and weakness   EXAM: MRI CERVICAL SPINE WITHOUT AND WITH CONTRAST   TECHNIQUE: Multiplanar and multiecho pulse sequences of the cervical spine, to include the craniocervical junction and cervicothoracic junction, were obtained without and with intravenous contrast.   CONTRAST:  10mL GADAVIST GADOBUTROL 1 MMOL/ML IV SOLN   COMPARISON:  None Available.   FINDINGS: Alignment: Physiologic.   Vertebrae: No fracture, evidence of discitis, or bone lesion.   Cord: There is hyperintense T2-weighted signal within the ventral spinal cord, asymmetric to the right at the C3-6 levels. No abnormal contrast enhancement.   Posterior Fossa, vertebral arteries, paraspinal tissues: Negative.   Disc levels:   C1-2: Unremarkable.   C2-3: Normal disc space and facet joints. There is no spinal canal stenosis. No neural foraminal stenosis.   C3-4: Intermediate sized disc osteophyte complex. There is no spinal canal stenosis. Mild bilateral neural foraminal stenosis.   C4-5: Normal disc space and facet joints. There is no spinal canal stenosis. No neural foraminal stenosis.   C5-6: Small disc bulge. There is no spinal canal stenosis. No neural foraminal stenosis.   C6-7: Left asymmetric disc bulge with bilateral uncovertebral hypertrophy. Mild spinal canal stenosis. Severe left neural foraminal stenosis.   C7-T1: Normal disc space and facet joints. There is no spinal canal stenosis. No neural foraminal stenosis.   IMPRESSION: 1. Hyperintense T2-weighted signal within the ventral spinal  cord, asymmetric to the right at the C3-6 levels. This is most consistent with transverse myelitis or other nonspecific demyelinating process. 2. Mild spinal canal stenosis and severe left neural foraminal stenosis at C6-7.                                                                                                                                TREATMENT : 491ft no AD   [facility] PT Assessment - 12/22/23 0001       Standardized Balance Assessment   Standardized Balance Assessment 10 meter walk test    Five times sit to stand comments  12.12s no UE    10 Meter Walk 1.32m/s      Timed Up and Go Test   Normal TUG (seconds) 10.59            -  [place] level 2 B UE/LE x8 mins -prescribed HEP (see below) and walking program  PATIENT EDUCATION: Education details: exam findings, HEP/walking program Person educated: Patient Education method: Explanation Education comprehension: verbalized understanding and needs further education  HOME EXERCISE PROGRAM: Access Code: [email or web address] URL: [email or web address] Date: 12/22/2023 Prepared by: Merry Lofty  Exercises - Supine Bridge  - 1 x daily - 7 x weekly - 3 sets - 10 reps - Standing Hip Abduction with Counter Support  - 1 x daily - 7 x weekly - 3 sets - 10 reps - Standing Hip Extension with Counter Support  - 1 x daily - 7 x weekly - 3 sets - 10 reps - Standing Knee Flexion with Counter Support  - 1 x daily - 7 x weekly - 3 sets - 10 reps - Standing March with Unilateral Counter Support  - 1 x daily - 7 x weekly - 3 sets - 10 reps - Heel Toe Raises with Unilateral Counter Support  - 1 x daily - 7 x weekly - 3 sets - 10 reps  You Can Walk For A Certain Length Of Time Each Day                          Walk 5 minutes 2 times per day.             Increase 2  minutes every 5 days              Work up to 30 minutes (1-2 times per day).               Example:                         Day 1-2           4-5 minutes      3 times per day                         Day 7-8           10-12 minutes 2-3 times per day                         Day 13-14       20-22 minutes 1-2 times per day   GOALS: Goals reviewed with patient? Yes  SHORT TERM GOALS: Target date: 01/02/24  Pt will be independent with initial HEP for improved functional strength and endurance  Baseline: to be provided Goal status: INITIAL  2.  Patient will improve ABC score to >/= 45% to demonstrate improved confidence with functional mobility  Baseline: 37% Goal status: INITIAL  3.  TUG goal Baseline: not indicated based on initial results Goal status: REVISED  4.  5xSTS goal  Baseline: not indicated based on initial results Goal status: REVISED  5.  goal Baseline: not indicated based on initial results Goal status: REVISED   LONG TERM GOALS: Target date: 02/06/24  Pt will be independent with final HEP for improved functional strength and endurance  Baseline: to be provided Goal status: INITIAL  Patient will improve ABC score to >/= 55% to demonstrate improved confidence with functional mobility  Baseline: 37% Goal status: INITIAL  TUG goal Baseline: not indicated based on initial results Goal status: REVISED  4.  5xSTS goal  Baseline: not indicated based on initial results Goal status: REVISED  5.   Patient will ambulate >/=550ft during to demonstrate improved community ambulation endurance Baseline: 475ft Goal status: REVISED  ASSESSMENT:  CLINICAL IMPRESSION: Patient seen for skilled PT session with emphasis on outcome measure assessment and establishing HEP. 2 Min Walk Test:  Instructed patient to ambulate as quickly and as safely as possible for 2 minutes using LRAD. Patient was allowed to take standing rest breaks without stopping the test, but if the patient required a sitting rest break the clock would be stopped and the test would be over.  Results: 490 feet. Results indicate that the patient has  reduced endurance with ambulation compared to age matched and population- specific norms.  Age Matched and population specific Norms: community dwelling elderly adults: 438ft (MDC: 6ft); MS: 567.39ft (MDC: 68ft); SCI: 358.75ft Patient completed the Timed Up and Go test (TUG) in 10.59 seconds.  Geriatrics: need for further assessment of fall risk: >= 12 sec; Recurrent falls: > 15 sec; Vestibular Disorders fall risk: > 15 sec; Parkinson's Disease fall risk: > 16 sec ([email or web address], 2023). Five times Sit to Stand Test (FTSS) Method: Use a straight back chair with a solid seat that is 17-18" high. Ask participant to sit on the chair with arms folded across their chest.   Instructions: "Stand up and sit down as quickly as possible 5 times, keeping your arms folded across your chest."   Measurement: Stop timing when the participant touches the chair in sitting the 5th time.  TIME: 12.12 sec  Cut off scores indicative of increased fall risk: >12 sec CVA, >16 sec PD, >13 sec vestibular (ANPTA Core Set of Outcome Measures for Adults with Neurologic Conditions, 2018). 10 Meter Walk Test: Patient instructed to walk 10 meters (32.8 ft) as quickly and as safely as possible at their normal speed x2 and at a fast speed x2. Time measured from 2 meter mark to 8 meter mark to accommodate ramp-up and ramp-down.  Normal speed: 1.14m/s Cut off scores: <0.4 m/s = household Ambulator, 0.4-0.8 m/s = limited community Ambulator, >0.8 m/s = community Ambulator, >1.2 m/s = crossing a street, <1.0 = increased fall risk MCID 0.05 m/s (small), 0.13 m/s (moderate), 0.06 m/s (significant)  (ANPTA Core Set of Outcome Measures for Adults with Neurologic Conditions, 2018). Patient likely will not need full POC due to initial exam results and already good return of R LE, but would benefit from a brief stint of functional strengthening and endurance based on findings.    OBJECTIVE IMPAIRMENTS: Abnormal gait, decreased activity  tolerance, decreased balance, decreased coordination, decreased endurance, decreased knowledge of condition, decreased knowledge of use of DME, difficulty walking, decreased strength, dizziness, impaired sensation, impaired UE functional use, postural dysfunction, and pain.   ACTIVITY LIMITATIONS: carrying, lifting, standing, squatting, stairs, transfers, bed mobility, bathing, dressing, locomotion level, and caring for others  PARTICIPATION LIMITATIONS: meal prep, cleaning, interpersonal relationship, driving, shopping, community activity, and occupation  PERSONAL FACTORS: Age, Behavior pattern, Past/current experiences, Social background, Time since onset of injury/illness/exacerbation, Transportation, and 3+ comorbidities: see above  are also affecting patient's functional outcome.   REHAB POTENTIAL: Good  CLINICAL DECISION MAKING: Stable/uncomplicated  EVALUATION COMPLEXITY: Low  PLAN:  PT FREQUENCY: 2x/week  PT DURATION: 6 weeks  PLANNED INTERVENTIONS: 97164- PT Re-evaluation, 97110-Therapeutic exercises, 97530- Therapeutic activity, 97112- Neuromuscular re-education, 97535- Self Care, 45409- Manual therapy, [phone]- Gait training, [phone]- Orthotic Fit/training, [phone]- Aquatic Therapy, Patient/Family education, Balance training, Stair training, Dry Needling, Vestibular training, Visual/preceptual remediation/compensation, and DME instructions  PLAN FOR NEXT SESSION:  , 5xSTS, TUG, HEP   Westley Foots, PT Westley Foots, PT, DPT, CBIS  12/23/2023, 7:44 AM     For all possible CPT codes, reference the Planned Interventions line above.     Check all conditions that are expected to impact treatment: {Conditions expected to impact treatment:Diabetes mellitus, Neurological condition and/or seizures, Psychological or psychiatric disorders, and Social determinants of health   If treatment provided at initial evaluation, no treatment charged due to lack of authorization.

## 2023-12-22 NOTE — Therapy (Signed)
OUTPATIENT OCCUPATIONAL THERAPY NEURO Treatment  [name] MRN: [id number] DOB:1966-12-20, 57 y.o., female Today's Date: 12/22/2023  PCP: Julieanne Manson, MD  REFERRING PROVIDER: [place], Eric J, DO   END OF SESSION:  OT End of Session - 12/22/23 1539     Visit Number 2    Number of Visits 13   including evaluation   Date for OT Re-Evaluation 02/06/24    Authorization Type [employer]    OT Start Time 1450    OT Stop Time 1530    OT Time Calculation (min) 40 min    Activity Tolerance Patient tolerated treatment well    Behavior During Therapy [facility] for tasks assessed/performed              Past Medical History:  Diagnosis Date   Anemia    Bipolar disorder (HCC)    Diabetes mellitus (HCC) 2022   type 2   Environmental and seasonal allergies 1968   GERD (gastroesophageal reflux disease)    Heart murmur    Hypertension    Upper GI bleed 01/15/2022   Past Surgical History:  Procedure Laterality Date   BIOPSY  01/17/2022   Procedure: BIOPSY;  Surgeon: Tressia Danas, MD;  Location: [facility] ENDOSCOPY;  Service: Gastroenterology;;   CESAREAN SECTION  1982, 1983, 1990   ESOPHAGOGASTRODUODENOSCOPY (EGD) WITH PROPOFOL N/A 01/17/2022   Procedure: ESOPHAGOGASTRODUODENOSCOPY (EGD) WITH PROPOFOL;  Surgeon: Tressia Danas, MD;  Location: [facility] ENDOSCOPY;  Service: Gastroenterology;  Laterality: N/A;   HERNIA REPAIR     Umbilical hernia in the 2nd grade   LEFT HEART CATH AND CORONARY ANGIOGRAPHY N/A 05/06/2023   Procedure: LEFT HEART CATH AND CORONARY ANGIOGRAPHY;  Surgeon: Tonny Bollman, MD;  Location: [facility] INVASIVE CV LAB;  Service: Cardiovascular;  Laterality: N/A;   TUBAL LIGATION  1990   Patient Active Problem List   Diagnosis Date Noted   Acute transverse myelitis (HCC) 11/29/2023   HTN (hypertension) 11/29/2023   Right lumbar radiculopathy 06/02/2023   Plantar fasciitis of right foot 06/02/2023   Primary hypertension 01/23/2022   Acute  esophagitis    Gastritis and gastroduodenitis    Hematemesis 01/16/2022   Type 2 diabetes mellitus (HCC) 01/15/2022   Hypertension associated with diabetes (HCC) 01/15/2022   Alcohol use 01/15/2022   Hyperlipidemia associated with type 2 diabetes mellitus (HCC) 01/15/2022   Extravasation of intravenous contrast medium 01/15/2022   Cocaine use disorder, mild, abuse (HCC) 06/15/2020   Bipolar I disorder, most recent episode depressed (HCC) 06/14/2020   Menopausal syndrome 05/30/2020   Bipolar disorder (HCC)    Prediabetes    Tooth infection 08/27/2018   Hemoptysis 08/27/2018   Blood in stool 07/23/2018   Chest pain of uncertain etiology 07/13/2012   Depression 07/13/2012   Tobacco abuse 07/13/2012   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    Class: Acute   Polysubstance (excluding opioids) dependence (HCC) 05/22/2012    Class: Acute   Breast tenderness in female 03/18/2012   Health care maintenance 03/18/2012   DEPRESSION 08/17/2007    ONSET DATE: 12/01/2023  (referral date)  REFERRING DIAG:  G37.3 (ICD-10-CM) - Acute transverse myelitis (HCC)  R29.898 (ICD-10-CM) - Right arm weakness  R20.0 (ICD-10-CM) - Numbness  R53.1 (ICD-10-CM) - Weakness    THERAPY DIAG:  Other lack of coordination  Muscle weakness (generalized)  Other symptoms and signs involving the nervous system  Other disturbances of skin sensation  Rationale for Evaluation and Treatment: Rehabilitation  SUBJECTIVE:   SUBJECTIVE STATEMENT: Pt  reported improved ability to move R UE. Pt reported continued numbness of R hand.  Pt accompanied by: self  PERTINENT HISTORY: cocaine use, NIDDM, PTSD/anxiety, HTN, hyperlipidemia, CAD, DM II, bipolar disorder, GERD  Per 12/11/23 MD Progress Notes: "onset of right arm weakness and some right arm pain when she woke up November 26, 2023.  She also had right arm and foot numbness.  Symptoms persisted so she went to the ED.   She was  positive for cocaine in her urine tox screen but states she had not used it within a week of the event...  Imaging: MRI of the head 11/28/2023 was normal for age showing a couple T2/FLAIR hyperintense foci in the subcortical or deep white matter consistent with mild chronic microvascular ischemic change.   MRI of the cervical spine 11/28/2023 showed a T2 hyperintense focus from C2-C3 to C6.  It was central but more to the right adjacent to C3.  Central gray may have been more involved adjacent to C5.  The focus did not enhance.  Mild spinal stenosis moderately severe left foraminal narrowing is noted at C6-C7.  Foraminal narrowing noted at C3-C4.  Potential for right C4 and left C7 nerve root compression.   MRI of the thoracic spine 11/30/2023 was normal.   MRI of the brachial plexus 11/28/2023 (report) was normal."  PRECAUTIONS: Fall  WEIGHT BEARING RESTRICTIONS: No  PAIN:  Are you having pain? Yes: NPRS scale: 5/10 Pain location: R shoulder and upper arm and R side of neck Pain description: aching Aggravating factors: using R arm Relieving factors: Gabapentin  FALLS: Has patient fallen in last 6 months? Yes. Number of falls 1, per pt  Pt fell 2 days ago trying to get off toilet d/t arm slipped.  Pt reported LOB episode when showering.     LIVING ENVIRONMENT: Lives with: lives with their family and lives with their spouse Lives in: House/apartment Stairs: Yes: Internal: 12 steps; on right going up and External: 2 steps; none Has following equipment at home: Grab bars  PLOF: Independent with basic ADLs, household management (cleaning, sweeping, laundry), grocery shopping, driving  PATIENT GOALS: "getting better and being able to lift my arm as much as possible... being able to not feel numbness in my hand... being able to stand up and sit down without being dizzy.  OBJECTIVE:  Note: Objective measures were completed at Evaluation unless otherwise noted.  HAND DOMINANCE:  Right  ADLs: Overall ADLs: ind with L hand compensation Transfers/ambulation related to ADLs: Eating: ind with L hand compensating Grooming: ind with L hand compensating, assistance for styling hair UB Dressing: assistance from family members, unable to get sleeve over R shoulder LB Dressing: assistance from family members Toileting: ind wiping, assistance from family members Bathing: sponge bathing, pt reported concerns about safety with showering d/t falls Tub Shower transfers: unable to get in/out of shower Equipment:  tub/shower  IADLs: Shopping: daughter orders groceries Light housekeeping: family assistance, pt completes laundry seated Meal Prep: family assistance Community mobility: family assistance and case manager assistance for attending medical appointments Handwriting: 100% legible, Pt reported handwriting looks different and much slower since onset of R arm weakness.  Pt required more than 1 minute to write x1 simple sentence (5 word sentence).  MOBILITY STATUS: Hx of falls and ambulates without A/E during eval  POSTURE COMMENTS:  forward head Sitting balance:  ind  ACTIVITY TOLERANCE: Activity tolerance: Pt reported increased fatigue since symptoms started, especially during mid-afternoon.  FUNCTIONAL OUTCOME MEASURES: Upper  Extremity Functional Index (UEFI): 13    UPPER EXTREMITY ROM:    Active ROM Right eval Left eval  Shoulder flexion 40* (PROM - WFL) WFL  Shoulder abduction    Shoulder adduction 50* (PROM - WFL)   Shoulder extension    Shoulder internal rotation    Shoulder external rotation    Elbow flexion 105* (PROM - WFL)   Elbow extension WFL   Wrist flexion WFL   Wrist extension WFL   Wrist ulnar deviation    Wrist radial deviation    Wrist pronation WFL   Wrist supination WFL though decreased compared to LUE   (Blank rows = not tested)  Pt demo'd slow movements of RUE during AROM.  Pt demo'd significant compensatory muscle  recruitment at R shoulder when attempting R shoulder flex/ABD. Pt required mod v/c to relax arm for PROM shoulder flex/ABD d/t pt potentially anticipating pain. Pt reported no pain with PROM shoulder flex/ABD though some pain when pt activated shoulder muscles in anticipatory muscle recruitment despite v/c to relax arm.  HAND FUNCTION: Grip strength: Right: 39.9 lbs without dynamometer supported on tabletop (46.6 with dynamometer supported on tabletop); Left: 46.2 lbs  COORDINATION: 9 Hole Peg test: Right: 43 sec; Left: 30 sec  SENSATION: Numbness and tingling of R fingers, digits 2-5 (excluding thumb).  EDEMA: No swelling noted  MUSCLE TONE: RUE: Within functional limits and LUE: Within functional limits  COGNITION: Overall cognitive status:  able to answer questions appropriately. Pt reported difficulty remembering things, such as preparing tea then forgetting that tea was preparing and needing to complete tasks immediately or forgetting.  ?Possible poor historian - Pt sometimes demo'd inconsistent reporting of recent events possibly d/t forgetting though pt confirmed events occurred if OT mentioned an event.  VISION: Subjective report: Pt reported no changes to vision Baseline vision: Wears glasses for reading only   PERCEPTION: Not tested  PRAXIS: Not tested  OBSERVATIONS:   Pt was pleasant. Pt ambulated ind without A/E. Pt leaned heavily to L side on L arm when using R hand for functional tasks (e.g. when completing 9-hole peg test).    TODAY'S TREATMENT:                                                                                                                              DATE:   [employer] OT educated pt on sensory safety/precautions, memory compensation strategies and activities to keep thinking skills sharp. Handout provided, see pt instructions.  OT provided pt with folder for "memory folder" to keep PT/OT handouts.  OT educated pt on medication Conservation officer, nature options and showed online examples. Handout provided, see pt instructions.   Pt reported driving recently and reported feeling uncomfortable when making turns: "I had to be careful and go slow." When OT asked pt about doctor's instructions regarding driving, pt reported being unsure about any driving restrictions or instructions. OT educated pt that final driving considerations are determined by  MD. OT recommended to pt to f/u with MD and ask about driving considerations. Pt verbalized understanding.  PATIENT EDUCATION: Education details: see today's tx above, using RUE for daily tasks to improve outcomes, importance of safety Person educated: Patient Education method: Explanation and Handouts Education comprehension: verbalized understanding and needs further education  HOME EXERCISE PROGRAM: TBD   GOALS: Goals reviewed with patient? Yes  SHORT TERM GOALS: Target date: 01/09/24  Patient will demonstrate initial RUE HEP with 25% verbal cues or less for proper execution.  Baseline: new to outpt OT Goal status: INITIAL  2.  Patient will demonstrate at least 5 lbs increase in RUE grip strength, unsupported on tabletop, as needed to open jars and other containers.  Baseline: Grip strength: Right: 39.9 lbs without dynamometer supported on tabletop; Left: 46.2 lbs Goal status: INITIAL  3.  Patient will demo improved FM coordination as evidenced by completing nine-hole peg with use of RUE in 37 seconds or less.  Baseline: 9 Hole Peg test: Right: 43 sec; Left: 30 sec Goal status: INITIAL  4.  Pt will verbalize understanding of at least 3 memory compensation strategies and activities to keep thinking skills sharp. Baseline: new to outpt OT, pt reported difficulty with memory since symptoms began Goal status: INITIAL  5.  Pt will demo improved AROM of R shoulder as evidenced by shoulder flex and shoulder ABD to 70*. Baseline:  R Shoulder: 40* flex, 50* ABD Goal status:  INITIAL  6.  Pt will report incorporation of RUE for functional tasks, including sweeping and folding laundry in seated position PRN. Baseline: Pt reported primarily using LUE to compensate for daily tasks and completing laundry tasks while seated. Goal status: INITIAL  7. Pt will independently recall at least 3 sensory precautions to increase safety d/t RUE numbness. Baseline: new to outpt OT Goal status: INITIAL  LONG TERM GOALS: Target date: 02/06/24  Patient will demonstrate updated RUE HEP with 25% verbal cues or less for proper execution.  Baseline: new to outpt OT Goal status: INITIAL  2.  Pt will report at least 9 point increase with UEFI score (reporting at least 22 points) indicating significantly improved functional use of affected UE. Baseline: UEFI - 13 Goal status: INITIAL  3.   Patient will demo improved FM coordination as evidenced by completing nine-hole peg with use of RUE in 32 seconds or less.  Baseline: 9 Hole Peg test: Right: 43 sec; Left: 30 sec Goal status: INITIAL  4.  Pt will demo improved coordination for handwriting as evidenced by writing simple sentence (no more than 5 words) in 40 seconds or less. Baseline: Pt demo'd 100% legibility though required more than 1 minute to write simple 5-word sentence. Pt reported handwriting is slower and not the same as before onset of symptoms. Goal status: INITIAL  5.  Pt will verbalize understanding of A/E for bathing tasks, including shower chair and tub/transfer bench, to reduce fall risk and improve ind for showering tasks. Baseline: pt does not have shower chair or shower bench Goal status: INITIAL  6.  Pt will demo understanding of adaptive strategies and A/E for UB dressing, LB dressing, and bathing tasks. Baseline: Pt reported difficulty with UB dressing, LB dressing, and bathing tasks. Pt reported currently sponge-bathing and requiring assistance for dressing tasks. Goal status:  INITIAL  ASSESSMENT:  CLINICAL IMPRESSION: Pt verbalized understanding of education today. Pt would benefit from review of education PRN. Pt would benefit from skilled OT services in the outpatient setting to  work on impairments as noted below to increase ind for functional tasks, improve RUE strength and coordination, to work towards improved AROM of RUE, and to promote participation in ADL/IADL tasks.  PERFORMANCE DEFICITS: in functional skills including ADLs, IADLs, coordination, dexterity, proprioception, sensation, ROM, strength, pain, flexibility, Fine motor control, Gross motor control, mobility, balance, body mechanics, endurance, decreased knowledge of use of DME, and UE functional use, cognitive skills including energy/drive, memory, and sequencing, and psychosocial skills including environmental adaptation.   IMPAIRMENTS: are limiting patient from ADLs, IADLs, work, leisure, and social participation.   CO-MORBIDITIES: may have co-morbidities  that affects occupational performance. Patient will benefit from skilled OT to address above impairments and improve overall function.  MODIFICATION OR ASSISTANCE TO COMPLETE EVALUATION: Min-Moderate modification of tasks or assist with assess necessary to complete an evaluation.  OT OCCUPATIONAL PROFILE AND HISTORY: Problem focused assessment: Including review of records relating to presenting problem.  CLINICAL DECISION MAKING: LOW - limited treatment options, no task modification necessary  REHAB POTENTIAL: Good  EVALUATION COMPLEXITY: Low    PLAN:  OT FREQUENCY: 2x/week  OT DURATION: 6 weeks (dates extended to allow for scheduling)  PLANNED INTERVENTIONS: 97168 OT Re-evaluation, 97535 self care/ADL training, 21308 therapeutic exercise, 97530 therapeutic activity, 97112 neuromuscular re-education, 97140 manual therapy, 97035 ultrasound, 97018 paraffin, 65784 fluidotherapy, 97032 electrical stimulation (manual), 97014 electrical  stimulation unattended, 97760 Orthotics management and training, 69629 Splinting (initial encounter), [id number] Subsequent splinting/medication, manual lymph drainage, passive range of motion, functional mobility training, energy conservation, patient/family education, and DME and/or AE instructions  RECOMMENDED OTHER SERVICES: PT eval already completed  CONSULTED AND AGREED WITH PLAN OF CARE: Patient  PLAN FOR NEXT SESSION:  Discuss shower chair and tub transfer bench options - let pt know that pt can call insurance to ask about coverage of DME  Initiate AAROM affected shoulder HEP (towel slides)  Initiate theraputty HEP for RUE grip strength and coordination  Initiate FM coordination HEP  Handwriting practice and adaptive strategies  UB and LB dressing adaptive strategies and A/E  Review memory compensation strategies and sensory precautions PRN     For all possible CPT codes, reference the Planned Interventions line above.     Check all conditions that are expected to impact treatment: {Conditions expected to impact treatment:Neurological condition and/or seizures   If treatment provided at initial evaluation, no treatment charged due to lack of authorization.     med   Wynetta Emery, OT 12/22/2023, 3:45 PM

## 2023-12-25 ENCOUNTER — Ambulatory Visit: Payer: MEDICAID

## 2023-12-25 ENCOUNTER — Ambulatory Visit: Payer: MEDICAID | Admitting: Occupational Therapy

## 2023-12-29 ENCOUNTER — Ambulatory Visit: Payer: MEDICAID | Admitting: Physical Therapy

## 2023-12-29 ENCOUNTER — Ambulatory Visit: Payer: MEDICAID | Attending: Internal Medicine | Admitting: Occupational Therapy

## 2023-12-29 VITALS — BP 93/67 | HR 76

## 2023-12-29 DIAGNOSIS — R29818 Other symptoms and signs involving the nervous system: Secondary | ICD-10-CM | POA: Insufficient documentation

## 2023-12-29 DIAGNOSIS — R2689 Other abnormalities of gait and mobility: Secondary | ICD-10-CM

## 2023-12-29 DIAGNOSIS — R278 Other lack of coordination: Secondary | ICD-10-CM | POA: Insufficient documentation

## 2023-12-29 DIAGNOSIS — R208 Other disturbances of skin sensation: Secondary | ICD-10-CM | POA: Diagnosis present

## 2023-12-29 DIAGNOSIS — R293 Abnormal posture: Secondary | ICD-10-CM | POA: Diagnosis present

## 2023-12-29 DIAGNOSIS — M6281 Muscle weakness (generalized): Secondary | ICD-10-CM | POA: Insufficient documentation

## 2023-12-29 NOTE — Therapy (Signed)
 OUTPATIENT PHYSICAL THERAPY NEURO TREATMENT   [name] MRN: [id number] DOB:December 26, 1965, 58 y.o., female Today's Date: 12/29/2023   PCP: Almarie Bolds, MD REFERRING PROVIDER: Eric Austria, DO  END OF SESSION:  PT End of Session - 12/29/23 1451     Visit Number 3    Number of Visits 13    Date for PT Re-Evaluation 02/06/24    Authorization Type [employer]    PT Start Time 1450   this therapist running behind   PT Stop Time 1530    PT Time Calculation (min) 40 min    Equipment Utilized During Treatment Gait belt    Activity Tolerance Patient tolerated treatment well    Behavior During Therapy [facility] for tasks assessed/performed              Past Medical History:  Diagnosis Date   Anemia    Bipolar disorder (HCC)    Diabetes mellitus (HCC) 2022   type 2   Environmental and seasonal allergies 1968   GERD (gastroesophageal reflux disease)    Heart murmur    Hypertension    Upper GI bleed 01/15/2022   Past Surgical History:  Procedure Laterality Date   BIOPSY  01/17/2022   Procedure: BIOPSY;  Surgeon: Eda Iha, MD;  Location: [facility] ENDOSCOPY;  Service: Gastroenterology;;   CESAREAN SECTION  1982, 1983, 1990   ESOPHAGOGASTRODUODENOSCOPY (EGD) WITH PROPOFOL  N/A 01/17/2022   Procedure: ESOPHAGOGASTRODUODENOSCOPY (EGD) WITH PROPOFOL ;  Surgeon: Eda Iha, MD;  Location: [facility] ENDOSCOPY;  Service: Gastroenterology;  Laterality: N/A;   HERNIA REPAIR     Umbilical hernia in the 2nd grade   LEFT HEART CATH AND CORONARY ANGIOGRAPHY N/A 05/06/2023   Procedure: LEFT HEART CATH AND CORONARY ANGIOGRAPHY;  Surgeon: Wonda Sharper, MD;  Location: [facility] INVASIVE CV LAB;  Service: Cardiovascular;  Laterality: N/A;   TUBAL LIGATION  1990   Patient Active Problem List   Diagnosis Date Noted   Acute transverse myelitis (HCC) 11/29/2023   HTN (hypertension) 11/29/2023   Right lumbar radiculopathy 06/02/2023   Plantar fasciitis of right foot  06/02/2023   Primary hypertension 01/23/2022   Acute esophagitis    Gastritis and gastroduodenitis    Hematemesis 01/16/2022   Type 2 diabetes mellitus (HCC) 01/15/2022   Hypertension associated with diabetes (HCC) 01/15/2022   Alcohol use 01/15/2022   Hyperlipidemia associated with type 2 diabetes mellitus (HCC) 01/15/2022   Extravasation of intravenous contrast medium 01/15/2022   Cocaine use disorder, mild, abuse (HCC) 06/15/2020   Bipolar I disorder, most recent episode depressed (HCC) 06/14/2020   Menopausal syndrome 05/30/2020   Bipolar disorder (HCC)    Prediabetes    Tooth infection 08/27/2018   Hemoptysis 08/27/2018   Blood in stool 07/23/2018   Chest pain of uncertain etiology 07/13/2012   Depression 07/13/2012   Tobacco abuse 07/13/2012   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    Class: Acute   Polysubstance (excluding opioids) dependence (HCC) 05/22/2012    Class: Acute   Breast tenderness in female 03/18/2012   Health care maintenance 03/18/2012   DEPRESSION 08/17/2007    ONSET DATE: 12/01/2023   REFERRING DIAG:  G37.3 (ICD-10-CM) - Acute transverse myelitis (HCC)  M79.601 (ICD-10-CM) - Right arm pain  R20.0 (ICD-10-CM) - Numbness  R53.1 (ICD-10-CM) - Weakness    THERAPY DIAG:  Muscle weakness (generalized)  Other abnormalities of gait and mobility  Abnormal posture  Rationale for Evaluation and Treatment: Rehabilitation  SUBJECTIVE:  SUBJECTIVE STATEMENT: Pt reports 7/10 pain in her R shoulder and between her shoulder blades today as well as ongoing numbness in her R hand (constant) and in her R foot (intermittent). She denies any falls or acute changes since last visit.  Pt has done her prescribed HEP a few times (got busy with  grandkids, life) but has been working on her walking program consistently. Pt interested in decreasing frequency of PT visits, wants to keep OT schedule the same.  Pt accompanied by: self  PERTINENT HISTORY: anemia, bipolar, DM, GERD, HTN, UGI bleed  PAIN:  Are you having pain? Yes: NPRS scale: 7/10 Pain location: R shoulder and between shoulder blades Pain description: numb, sore Aggravating factors: using R arm Relieving factors: not using R arm  PRECAUTIONS: Fall   PATIENT GOALS: to not fall  OBJECTIVE:  Note: Objective measures were completed at Evaluation unless otherwise noted.  DIAGNOSTIC FINDINGS: 11/28/23 cspine MRI Narrative & Impression  CLINICAL DATA:  Right arm pain, numbness and weakness   EXAM: MRI CERVICAL SPINE WITHOUT AND WITH CONTRAST   TECHNIQUE: Multiplanar and multiecho pulse sequences of the cervical spine, to include the craniocervical junction and cervicothoracic junction, were obtained without and with intravenous contrast.   CONTRAST:  10mL GADAVIST  GADOBUTROL  1 MMOL/ML IV SOLN   COMPARISON:  None Available.   FINDINGS: Alignment: Physiologic.   Vertebrae: No fracture, evidence of discitis, or bone lesion.   Cord: There is hyperintense T2-weighted signal within the ventral spinal cord, asymmetric to the right at the C3-6 levels. No abnormal contrast enhancement.   Posterior Fossa, vertebral arteries, paraspinal tissues: Negative.   Disc levels:   C1-2: Unremarkable.   C2-3: Normal disc space and facet joints. There is no spinal canal stenosis. No neural foraminal stenosis.   C3-4: Intermediate sized disc osteophyte complex. There is no spinal canal stenosis. Mild bilateral neural foraminal stenosis.   C4-5: Normal disc space and facet joints. There is no spinal canal stenosis. No neural foraminal stenosis.   C5-6: Small disc bulge. There is no spinal canal stenosis. No neural foraminal stenosis.   C6-7: Left asymmetric disc  bulge with bilateral uncovertebral hypertrophy. Mild spinal canal stenosis. Severe left neural foraminal stenosis.   C7-T1: Normal disc space and facet joints. There is no spinal canal stenosis. No neural foraminal stenosis.   IMPRESSION: 1. Hyperintense T2-weighted signal within the ventral spinal cord, asymmetric to the right at the C3-6 levels. This is most consistent with transverse myelitis or other nonspecific demyelinating process. 2. Mild spinal canal stenosis and severe left neural foraminal stenosis at C6-7.                                                                                                                                TREATMENT TherAct Vitals:   12/29/23 1455  BP: 93/67  Pulse: 76   Assessed in sitting in LUE at beginning of session. Pt's BP low but [facility] for  safe participation in PT session. Educated pt on importance of monitoring BP as well as for sign/symptoms of feeling lightheaded, dizzy, etc. Pt reports she takes several medications for BP management, encouraged her to speak to her physician regarding her low BP readings in therapy as well as her reports of feeling lightheaded/dizzy at times at home.   TherEx SciFit multi-peaks level 3 for 8 minutes using BUE/BLEs for neural priming for reciprocal movement, dynamic cardiovascular warmup and increased amplitude of stepping. RPE of 5/10 following activity.  To work on stretching thoracic region: At countertop: Thoracic rotations x 10 reps B, difficulty with RUE due to decreased shoulder strength and ROM Thoracic thread the needles x 10 reps B, feels a good stretch with this    NMR Staggered sit to stand from EOM with no UE x 5 reps, x 10 reps, x 6 reps to fatigue  Sit to stand onto foam airex with no UE x 10 reps   PATIENT EDUCATION: Education details: PT POC (decreased frequency), continue HEP and walking program as able and importance of compliance with these with decreased frequency of visits,  BP management Person educated: Patient Education method: Explanation Education comprehension: verbalized understanding and needs further education  HOME EXERCISE PROGRAM: Access Code: [email or web address] URL: [email or web address] Date: 12/22/2023 Prepared by: Delon Pop  Exercises - Supine Bridge  - 1 x daily - 7 x weekly - 3 sets - 10 reps - Standing Hip Abduction with Counter Support  - 1 x daily - 7 x weekly - 3 sets - 10 reps - Standing Hip Extension with Counter Support  - 1 x daily - 7 x weekly - 3 sets - 10 reps - Standing Knee Flexion with Counter Support  - 1 x daily - 7 x weekly - 3 sets - 10 reps - Standing March with Unilateral Counter Support  - 1 x daily - 7 x weekly - 3 sets - 10 reps - Heel Toe Raises with Unilateral Counter Support  - 1 x daily - 7 x weekly - 3 sets - 10 reps  You Can Walk For A Certain Length Of Time Each Day                          Walk 5 minutes 2 times per day.             Increase 2  minutes every 5 days              Work up to 30 minutes (1-2 times per day).               Example:                         Day 1-2           4-5 minutes     3 times per day                         Day 7-8           10-12 minutes 2-3 times per day                         Day 13-14       20-22 minutes 1-2 times per day   GOALS: Goals reviewed with patient? Yes  SHORT TERM GOALS: Target date: 01/02/24  Pt will be  independent with initial HEP for improved functional strength and endurance  Baseline: to be provided Goal status: INITIAL  2.  Patient will improve ABC score to >/= 45% to demonstrate improved confidence with functional mobility  Baseline: 37% Goal status: INITIAL  3.  TUG goal Baseline: not indicated based on initial results Goal status: REVISED  4.  5xSTS goal  Baseline: not indicated based on initial results Goal status: REVISED  5.  goal Baseline: not indicated based on initial results Goal status: REVISED   LONG TERM  GOALS: Target date: 02/06/24  Pt will be independent with final HEP for improved functional strength and endurance  Baseline: to be provided Goal status: INITIAL  Patient will improve ABC score to >/= 55% to demonstrate improved confidence with functional mobility  Baseline: 37% Goal status: INITIAL  TUG goal Baseline: not indicated based on initial results Goal status: REVISED  4.  5xSTS goal  Baseline: not indicated based on initial results Goal status: REVISED  5.   Patient will ambulate >/=53ft during to demonstrate improved community ambulation endurance Baseline: 444ft Goal status: REVISED  ASSESSMENT:  CLINICAL IMPRESSION: Emphasis of skilled PT session on continuing to assess vitals signs, working on global endurance training, functional LE strengthening and NMR, and stretching of thoracic region to address pain this date. Pt's BP is low when assessed but still within safe range for participation in PT session. Pt continues to benefit from skilled PT services to work towards increased safety and independence with functional mobility. Continue POC.    OBJECTIVE IMPAIRMENTS: Abnormal gait, decreased activity tolerance, decreased balance, decreased coordination, decreased endurance, decreased knowledge of condition, decreased knowledge of use of DME, difficulty walking, decreased strength, dizziness, impaired sensation, impaired UE functional use, postural dysfunction, and pain.   ACTIVITY LIMITATIONS: carrying, lifting, standing, squatting, stairs, transfers, bed mobility, bathing, dressing, locomotion level, and caring for others  PARTICIPATION LIMITATIONS: meal prep, cleaning, interpersonal relationship, driving, shopping, community activity, and occupation  PERSONAL FACTORS: Age, Behavior pattern, Past/current experiences, Social background, Time since onset of injury/illness/exacerbation, Transportation, and 3+ comorbidities: see above  are also affecting patient's  functional outcome.   REHAB POTENTIAL: Good  CLINICAL DECISION MAKING: Stable/uncomplicated  EVALUATION COMPLEXITY: Low  PLAN:  PT FREQUENCY: 2x/week  PT DURATION: 6 weeks  PLANNED INTERVENTIONS: 97164- PT Re-evaluation, 97110-Therapeutic exercises, 97530- Therapeutic activity, 97112- Neuromuscular re-education, 97535- Self Care, [id number]- Manual therapy, [phone]- Gait training, [phone]- Orthotic Fit/training, [phone]- Aquatic Therapy, Patient/Family education, Balance training, Stair training, Dry Needling, Vestibular training, Visual/preceptual remediation/compensation, and DME instructions  PLAN FOR NEXT SESSION: check BP, decreased frequency to 1x/week to save visits and per discussion with patient; sit to stand variations (LLE on step, to wedge, resisted), functional strengthening and RLE NMR, endurance, resisted walking?   Shalawn Wynder, PT Waddell [place]gate, PT, DPT, CSRS   12/29/2023, 3:33 PM     For all possible CPT codes, reference the Planned Interventions line above.     Check all conditions that are expected to impact treatment: {Conditions expected to impact treatment:Diabetes mellitus, Neurological condition and/or seizures, Psychological or psychiatric disorders, and Social determinants of health   If treatment provided at initial evaluation, no treatment charged due to lack of authorization.

## 2023-12-29 NOTE — Therapy (Signed)
 OUTPATIENT OCCUPATIONAL THERAPY NEURO Treatment  [name] MRN: [id number] DOB:Oct 16, 1966, 58 y.o., female Today's Date: 12/29/2023  PCP: Adella Norris, MD  REFERRING PROVIDER: [place], Eric J, DO   END OF SESSION:  OT End of Session - 12/29/23 1533     Visit Number 3    Number of Visits 13   including evaluation   Date for OT Re-Evaluation 02/06/24    Authorization Type [employer]    OT Start Time 1534    OT Stop Time 1615    OT Time Calculation (min) 41 min    Activity Tolerance Patient tolerated treatment well    Behavior During Therapy [facility] for tasks assessed/performed               Past Medical History:  Diagnosis Date   Anemia    Bipolar disorder (HCC)    Diabetes mellitus (HCC) 2022   type 2   Environmental and seasonal allergies 1968   GERD (gastroesophageal reflux disease)    Heart murmur    Hypertension    Upper GI bleed 01/15/2022   Past Surgical History:  Procedure Laterality Date   BIOPSY  01/17/2022   Procedure: BIOPSY;  Surgeon: Eda Iha, MD;  Location: [facility] ENDOSCOPY;  Service: Gastroenterology;;   CESAREAN SECTION  1982, 1983, 1990   ESOPHAGOGASTRODUODENOSCOPY (EGD) WITH PROPOFOL  N/A 01/17/2022   Procedure: ESOPHAGOGASTRODUODENOSCOPY (EGD) WITH PROPOFOL ;  Surgeon: Eda Iha, MD;  Location: [facility] ENDOSCOPY;  Service: Gastroenterology;  Laterality: N/A;   HERNIA REPAIR     Umbilical hernia in the 2nd grade   LEFT HEART CATH AND CORONARY ANGIOGRAPHY N/A 05/06/2023   Procedure: LEFT HEART CATH AND CORONARY ANGIOGRAPHY;  Surgeon: Wonda Sharper, MD;  Location: [facility] INVASIVE CV LAB;  Service: Cardiovascular;  Laterality: N/A;   TUBAL LIGATION  1990   Patient Active Problem List   Diagnosis Date Noted   Acute transverse myelitis (HCC) 11/29/2023   HTN (hypertension) 11/29/2023   Right lumbar radiculopathy 06/02/2023   Plantar fasciitis of right foot 06/02/2023   Primary hypertension 01/23/2022   Acute  esophagitis    Gastritis and gastroduodenitis    Hematemesis 01/16/2022   Type 2 diabetes mellitus (HCC) 01/15/2022   Hypertension associated with diabetes (HCC) 01/15/2022   Alcohol use 01/15/2022   Hyperlipidemia associated with type 2 diabetes mellitus (HCC) 01/15/2022   Extravasation of intravenous contrast medium 01/15/2022   Cocaine use disorder, mild, abuse (HCC) 06/15/2020   Bipolar I disorder, most recent episode depressed (HCC) 06/14/2020   Menopausal syndrome 05/30/2020   Bipolar disorder (HCC)    Prediabetes    Tooth infection 08/27/2018   Hemoptysis 08/27/2018   Blood in stool 07/23/2018   Chest pain of uncertain etiology 07/13/2012   Depression 07/13/2012   Tobacco abuse 07/13/2012   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    Class: Acute   Polysubstance (excluding opioids) dependence (HCC) 05/22/2012    Class: Acute   Breast tenderness in female 03/18/2012   Health care maintenance 03/18/2012   DEPRESSION 08/17/2007    ONSET DATE: 12/01/2023  (referral date)  REFERRING DIAG:  G37.3 (ICD-10-CM) - Acute transverse myelitis (HCC)  R29.898 (ICD-10-CM) - Right arm weakness  R20.0 (ICD-10-CM) - Numbness  R53.1 (ICD-10-CM) - Weakness    THERAPY DIAG:  Other lack of coordination  Muscle weakness (generalized)  Other symptoms and signs involving the nervous system  Other disturbances of skin sensation  Rationale for Evaluation and Treatment: Rehabilitation  SUBJECTIVE:   SUBJECTIVE STATEMENT:  Pt reported pain of right arm and across neck today. Pt reported everything is going okay.   Pt accompanied by: self  PERTINENT HISTORY: cocaine use, NIDDM, PTSD/anxiety, HTN, hyperlipidemia, CAD, DM II, bipolar disorder, GERD  Per 12/11/23 MD Progress Notes: onset of right arm weakness and some right arm pain when she woke up November 26, 2023.  She also had right arm and foot numbness.  Symptoms persisted so she went to the ED.   She  was positive for cocaine in her urine tox screen but states she had not used it within a week of the event...  Imaging: MRI of the head 11/28/2023 was normal for age showing a couple T2/FLAIR hyperintense foci in the subcortical or deep white matter consistent with mild chronic microvascular ischemic change.   MRI of the cervical spine 11/28/2023 showed a T2 hyperintense focus from C2-C3 to C6.  It was central but more to the right adjacent to C3.  Central gray may have been more involved adjacent to C5.  The focus did not enhance.  Mild spinal stenosis moderately severe left foraminal narrowing is noted at C6-C7.  Foraminal narrowing noted at C3-C4.  Potential for right C4 and left C7 nerve root compression.   MRI of the thoracic spine 11/30/2023 was normal.   MRI of the brachial plexus 11/28/2023 (report) was normal.  PRECAUTIONS: Fall  WEIGHT BEARING RESTRICTIONS: No  PAIN:  Are you having pain? Yes: NPRS scale: 8/10 Pain location: R arm and across neck Pain description: aching Aggravating factors: using R arm Relieving factors: Gabapentin   FALLS: Has patient fallen in last 6 months? Yes. Number of falls 1, per pt  Pt fell 2 days ago trying to get off toilet d/t arm slipped.  Pt reported LOB episode when showering.     LIVING ENVIRONMENT: Lives with: lives with their family and lives with their spouse Lives in: House/apartment Stairs: Yes: Internal: 12 steps; on right going up and External: 2 steps; none Has following equipment at home: Grab bars  PLOF: Independent with basic ADLs, household management (cleaning, sweeping, laundry), grocery shopping, driving  PATIENT GOALS: getting better and being able to lift my arm as much as possible... being able to not feel numbness in my hand... being able to stand up and sit down without being dizzy.  OBJECTIVE:  Note: Objective measures were completed at Evaluation unless otherwise noted.  HAND DOMINANCE: Right  ADLs: Overall  ADLs: ind with L hand compensation Transfers/ambulation related to ADLs: Eating: ind with L hand compensating Grooming: ind with L hand compensating, assistance for styling hair UB Dressing: assistance from family members, unable to get sleeve over R shoulder LB Dressing: assistance from family members Toileting: ind wiping, assistance from family members Bathing: sponge bathing, pt reported concerns about safety with showering d/t falls Tub Shower transfers: unable to get in/out of shower Equipment:  tub/shower  IADLs: Shopping: daughter orders groceries Light housekeeping: family assistance, pt completes laundry seated Meal Prep: family assistance Community mobility: family assistance and case manager assistance for attending medical appointments Handwriting: 100% legible, Pt reported handwriting looks different and much slower since onset of R arm weakness.  Pt required more than 1 minute to write x1 simple sentence (5 word sentence).  MOBILITY STATUS: Hx of falls and ambulates without A/E during eval  POSTURE COMMENTS:  forward head Sitting balance:  ind  ACTIVITY TOLERANCE: Activity tolerance: Pt reported increased fatigue since symptoms started, especially during mid-afternoon.  FUNCTIONAL OUTCOME MEASURES: Upper Extremity Functional  Index (UEFI): 13    UPPER EXTREMITY ROM:    Active ROM Right eval Left eval  Shoulder flexion 40* (PROM - WFL) WFL  Shoulder abduction    Shoulder adduction 50* (PROM - WFL)   Shoulder extension    Shoulder internal rotation    Shoulder external rotation    Elbow flexion 105* (PROM - WFL)   Elbow extension WFL   Wrist flexion WFL   Wrist extension WFL   Wrist ulnar deviation    Wrist radial deviation    Wrist pronation WFL   Wrist supination WFL though decreased compared to LUE   (Blank rows = not tested)  Pt demo'd slow movements of RUE during AROM.  Pt demo'd significant compensatory muscle recruitment at R shoulder when  attempting R shoulder flex/ABD. Pt required mod v/c to relax arm for PROM shoulder flex/ABD d/t pt potentially anticipating pain. Pt reported no pain with PROM shoulder flex/ABD though some pain when pt activated shoulder muscles in anticipatory muscle recruitment despite v/c to relax arm.  HAND FUNCTION: Grip strength: Right: 39.9 lbs without dynamometer supported on tabletop (46.6 with dynamometer supported on tabletop); Left: 46.2 lbs  COORDINATION: 9 Hole Peg test: Right: 43 sec; Left: 30 sec  SENSATION: Numbness and tingling of R fingers, digits 2-5 (excluding thumb).  EDEMA: No swelling noted  MUSCLE TONE: RUE: Within functional limits and LUE: Within functional limits  COGNITION: Overall cognitive status:  able to answer questions appropriately. Pt reported difficulty remembering things, such as preparing tea then forgetting that tea was preparing and needing to complete tasks immediately or forgetting.  ?Possible poor historian - Pt sometimes demo'd inconsistent reporting of recent events possibly d/t forgetting though pt confirmed events occurred if OT mentioned an event.  VISION: Subjective report: Pt reported no changes to vision Baseline vision: Wears glasses for reading only   PERCEPTION: Not tested  PRAXIS: Not tested  OBSERVATIONS:   Pt was pleasant. Pt ambulated ind without A/E. Pt leaned heavily to L side on L arm when using R hand for functional tasks (e.g. when completing 9-hole peg test).    TODAY'S TREATMENT:                                                                                                                              DATE:   Self-Care OT educated pt on sleep positioning to decrease pain of R arm by sleeping on unaffected side and placing pillow behind back to prevent rolling to affected side. Pt verbalized understanding.  OT educated pt on tub transfer bench and shower chair options to improve safety and reduce fall risk during bathing ADL  tasks. OT showed pt examples of options and provided handout with pictures (see pt instructions). Pt verbalized understanding. OT recommended to pt to call insurance to ask about insurance coverage for DME options. Pt verbalized understanding.   TherEx Access Code: [email or web address] URL: [email or web address] Date: 12/29/2023 Prepared by: Geofm Coder  OT initiated shoulder HEP to improve affected UE shoulder AROM, strengthening and stability. Pt returned demonstration of exercises (1 set of each exercise completed today d/t time constraints): - Seated Shoulder Flexion Towel Slide at Table Top  - 2 x daily - 2 sets - 10 reps, hold 5-20 seconds - Seated Shoulder Abduction Towel Slide at Table Top  - 2 x daily - 2 sets - 10 reps, hold 5-20 seconds - Seated Shoulder Scaption Slide at Table Top with Forearm in Neutral  - 2 x daily - 2 sets - 10 reps, hold 5-20 seconds - Seated Scapular Retraction  - 2 x daily - 2 sets - 10 reps, hold 5 seconds - Seated Shoulder Shrug  - 2 x daily - 2 sets - 10 reps - Seated Shoulder Shrug Circles AROM Backward  - 2 x daily - 2 sets - 10 reps  TherAct OT initiated theraputty HEP (pink) to improve FM coordination and dexterity of affected UE, to improve affected UE grip and pinch strengthening. Pt returned demonstration of exercises (3-5 reps of each exercise completed today d/t time constraints): Access Code: same as shoulder AROM above. - Putty Squeezes  - 2 x daily - 1 sets - 10 reps - Rolling Putty on Table  - 2 x daily - 1 sets - 10 reps - Tip Pinch with Putty  - 2 x daily - 1 sets - 10 reps - Seated Three Finger Pinch with Putty  - 2 x daily - 1 sets - 10 reps - Finger Key Grip with Putty  - 2 x daily - 1 sets - 10 reps - Finger Extension with Putty  - 2 x daily - 1 sets - 10 reps - Marble Pick Up from Putty  - 2 x daily - 1 sets - 10 reps  PATIENT EDUCATION: Education details: see today's tx above Person educated: Patient Education method:  Explanation, Demonstration, and Handouts Education comprehension: verbalized understanding, returned demonstration, and needs further education  HOME EXERCISE PROGRAM: 12/29/23 - shower chair and shower bench (handout), shoulder AROM and pink theraputty HEP; Access Code: [email or web address]   GOALS: Goals reviewed with patient? Yes  SHORT TERM GOALS: Target date: 01/09/24  Patient will demonstrate initial RUE HEP with 25% verbal cues or less for proper execution.  Baseline: new to outpt OT Goal status: INITIAL  2.  Patient will demonstrate at least 5 lbs increase in RUE grip strength, unsupported on tabletop, as needed to open jars and other containers.  Baseline: Grip strength: Right: 39.9 lbs without dynamometer supported on tabletop; Left: 46.2 lbs Goal status: INITIAL  3.  Patient will demo improved FM coordination as evidenced by completing nine-hole peg with use of RUE in 37 seconds or less.  Baseline: 9 Hole Peg test: Right: 43 sec; Left: 30 sec Goal status: INITIAL  4.  Pt will verbalize understanding of at least 3 memory compensation strategies and activities to keep thinking skills sharp. Baseline: new to outpt OT, pt reported difficulty with memory since symptoms began Goal status: INITIAL  5.  Pt will demo improved AROM of R shoulder as evidenced by shoulder flex and shoulder ABD to 70*. Baseline:  R Shoulder: 40* flex, 50* ABD Goal status: INITIAL  6.  Pt will report incorporation of RUE for functional tasks, including sweeping and folding laundry in seated position PRN. Baseline: Pt reported primarily using LUE to compensate for daily tasks and completing laundry tasks while seated. Goal status: INITIAL  7. Pt will independently recall  at least 3 sensory precautions to increase safety d/t RUE numbness. Baseline: new to outpt OT Goal status: INITIAL  LONG TERM GOALS: Target date: 02/06/24  Patient will demonstrate updated RUE HEP with 25% verbal cues or less for proper  execution.  Baseline: new to outpt OT Goal status: INITIAL  2.  Pt will report at least 9 point increase with UEFI score (reporting at least 22 points) indicating significantly improved functional use of affected UE. Baseline: UEFI - 13 Goal status: INITIAL  3.   Patient will demo improved FM coordination as evidenced by completing nine-hole peg with use of RUE in 32 seconds or less.  Baseline: 9 Hole Peg test: Right: 43 sec; Left: 30 sec Goal status: INITIAL  4.  Pt will demo improved coordination for handwriting as evidenced by writing simple sentence (no more than 5 words) in 40 seconds or less. Baseline: Pt demo'd 100% legibility though required more than 1 minute to write simple 5-word sentence. Pt reported handwriting is slower and not the same as before onset of symptoms. Goal status: INITIAL  5.  Pt will verbalize understanding of A/E for bathing tasks, including shower chair and tub/transfer bench, to reduce fall risk and improve ind for showering tasks. Baseline: pt does not have shower chair or shower bench Goal status: INITIAL  6.  Pt will demo understanding of adaptive strategies and A/E for UB dressing, LB dressing, and bathing tasks. Baseline: Pt reported difficulty with UB dressing, LB dressing, and bathing tasks. Pt reported currently sponge-bathing and requiring assistance for dressing tasks. Goal status: INITIAL  ASSESSMENT:  CLINICAL IMPRESSION: Pt tolerated tasks well. Pt would benefit from skilled OT services in the outpatient setting to work on impairments as noted below to increase ind for functional tasks, improve RUE strength and coordination, to work towards improved AROM of RUE, and to promote participation in ADL/IADL tasks.  PERFORMANCE DEFICITS: in functional skills including ADLs, IADLs, coordination, dexterity, proprioception, sensation, ROM, strength, pain, flexibility, Fine motor control, Gross motor control, mobility, balance, body mechanics,  endurance, decreased knowledge of use of DME, and UE functional use, cognitive skills including energy/drive, memory, and sequencing, and psychosocial skills including environmental adaptation.   IMPAIRMENTS: are limiting patient from ADLs, IADLs, work, leisure, and social participation.   CO-MORBIDITIES: may have co-morbidities  that affects occupational performance. Patient will benefit from skilled OT to address above impairments and improve overall function.  MODIFICATION OR ASSISTANCE TO COMPLETE EVALUATION: Min-Moderate modification of tasks or assist with assess necessary to complete an evaluation.  OT OCCUPATIONAL PROFILE AND HISTORY: Problem focused assessment: Including review of records relating to presenting problem.  CLINICAL DECISION MAKING: LOW - limited treatment options, no task modification necessary  REHAB POTENTIAL: Good  EVALUATION COMPLEXITY: Low    PLAN:  OT FREQUENCY: 2x/week  OT DURATION: 6 weeks (dates extended to allow for scheduling)  PLANNED INTERVENTIONS: 97168 OT Re-evaluation, 97535 self care/ADL training, 02889 therapeutic exercise, 97530 therapeutic activity, 97112 neuromuscular re-education, 97140 manual therapy, 97035 ultrasound, 97018 paraffin, 02960 fluidotherapy, 97032 electrical stimulation (manual), 97014 electrical stimulation unattended, 97760 Orthotics management and training, 02239 Splinting (initial encounter), [id number] Subsequent splinting/medication, manual lymph drainage, passive range of motion, functional mobility training, energy conservation, patient/family education, and DME and/or AE instructions  RECOMMENDED OTHER SERVICES: PT eval already completed  CONSULTED AND AGREED WITH PLAN OF CARE: Patient  PLAN FOR NEXT SESSION:   Did pt choose to obtain shower chair or transfer tub bench?  Initiate FM coordination HEP  Handwriting  practice and adaptive strategies  UB and LB dressing adaptive strategies and A/E  Review memory  compensation strategies, sensory precautions, and shoulder AROM HEP, theraputty HEP PRN     For all possible CPT codes, reference the Planned Interventions line above.     Check all conditions that are expected to impact treatment: {Conditions expected to impact treatment:Neurological condition and/or seizures   If treatment provided at initial evaluation, no treatment charged due to lack of authorization.     med   Geofm FORBES Coder, OT 12/29/2023, 4:22 PM

## 2023-12-31 ENCOUNTER — Encounter (HOSPITAL_COMMUNITY): Payer: Self-pay | Admitting: Psychiatry

## 2023-12-31 ENCOUNTER — Ambulatory Visit: Payer: MEDICAID | Admitting: Physical Therapy

## 2023-12-31 ENCOUNTER — Ambulatory Visit: Payer: MEDICAID | Admitting: Occupational Therapy

## 2023-12-31 ENCOUNTER — Telehealth (HOSPITAL_COMMUNITY): Payer: MEDICAID | Admitting: Psychiatry

## 2023-12-31 DIAGNOSIS — F3130 Bipolar disorder, current episode depressed, mild or moderate severity, unspecified: Secondary | ICD-10-CM | POA: Diagnosis not present

## 2023-12-31 MED ORDER — HYDROXYZINE PAMOATE 50 MG PO CAPS: 50.0000 mg | ORAL_CAPSULE | Freq: Four times a day (QID) | ORAL | 3 refills | Status: DC | PRN

## 2023-12-31 MED ORDER — LAMOTRIGINE 200 MG PO TABS: 200.0000 mg | ORAL_TABLET | Freq: Every day | ORAL | 3 refills | Status: DC

## 2023-12-31 MED ORDER — QUETIAPINE FUMARATE 100 MG PO TABS: 100.0000 mg | ORAL_TABLET | Freq: Every day | ORAL | 3 refills | Status: DC

## 2023-12-31 NOTE — Progress Notes (Signed)
 BH MD/PA/NP OP Progress Note Virtual Visit via Telephone Note  I connected with Ashleigh U Morais on 12/31/23 at  2:00 PM EST by telephone and verified that I am speaking with the correct person using two identifiers.  Location: Patient: home Provider: Clinic   I discussed the limitations, risks, security and privacy concerns of performing an evaluation and management service by telephone and the availability of in person appointments. I also discussed with the patient that there may be a patient responsible charge related to this service. The patient expressed understanding and agreed to proceed.   I provided 20 minutes of non-face-to-face time during this encounter.         12/31/2023 1:31 PM Cheyenne Fowler  MRN:  [id number]  Chief Complaint:  I was hospitalized after Thanksgiving   HPI: 58 year-old female seen today for follow-up psychiatric evaluation. She has a psychiatric history of polysubstance use (cocaine, alcohol), depression, and bipolar disorder. She was being managed on Lamictal  200 mg daily, Seroquel  100 mg nightly, and Vistaril  50mg  4 times daily as needed. She reports that her medications are effective in managing her psychiatric conditions.  Today patient was unable to login virtually.  Provider attempted to call patient on her phone however her phone continued to go to voicemail.  Provider was able to contact patient on her husband's phone.  Provider informed patient that in future visits should be in person as phone visits are not prohibitive.  She endorsed understanding and agreed.  During exam she was pleasant, cooperative,  and engaged in conversation. She reports that her Thanksgiving holiday was great but notes shortly after she was hospitalized. Per chart review on 11/28/2023-12/03/2023 she was hospitalized for Transverse myelitis.  Patient UDS was positive for cocaine and opioids.  She reports that she was later transferred to [facility] and given steroids. She notes that  she was also given gabapentin  which works for her pain.  She now is in physical therapy and notes that she is in a better place physically.  Mentally patient notes that she is doing well.  She was unable to do her GAD-7 or PHQ-9 today as she notes that her husband needed to leave for work and needed his phone.  She does note that her anxiety and depression are well-managed.  She informed clinical research associate that she has adequate sleep and appetite.  Today she denies SI/HI/VAH, mania, paranoia.  Patient informed clinical research associate that her daughter, her husband, her brother has moved into a new home.  They are happy that they no longer have to deal with her daughter's ex-girlfriend's housing issues.  Overall patient notes that she is doing well.  No medication changes made today.  Patient agreeable to continue medication as prescribed.  No other concerns at this time. Visit Diagnosis:    ICD-10-CM   1. Bipolar I disorder, most recent episode depressed (HCC)  F31.30 hydrOXYzine  (VISTARIL ) 50 MG capsule    lamoTRIgine  (LAMICTAL ) 200 MG tablet    QUEtiapine  (SEROQUEL ) 100 MG tablet         Past Psychiatric History: Depression, substance induced mood disorder, Bipolar disorder,  Past Medical History:  Past Medical History:  Diagnosis Date   Anemia    Bipolar disorder (HCC)    Diabetes mellitus (HCC) 2022   type 2   Environmental and seasonal allergies 1968   GERD (gastroesophageal reflux disease)    Heart murmur    Hypertension    Upper GI bleed 01/15/2022    Past Surgical History:  Procedure Laterality Date   BIOPSY  01/17/2022   Procedure: BIOPSY;  Surgeon: Eda Iha, MD;  Location: [facility] ENDOSCOPY;  Service: Gastroenterology;;   CESAREAN SECTION  1982, 1983, 1990   ESOPHAGOGASTRODUODENOSCOPY (EGD) WITH PROPOFOL  N/A 01/17/2022   Procedure: ESOPHAGOGASTRODUODENOSCOPY (EGD) WITH PROPOFOL ;  Surgeon: Eda Iha, MD;  Location: [facility] ENDOSCOPY;  Service: Gastroenterology;  Laterality: N/A;   HERNIA  REPAIR     Umbilical hernia in the 2nd grade   LEFT HEART CATH AND CORONARY ANGIOGRAPHY N/A 05/06/2023   Procedure: LEFT HEART CATH AND CORONARY ANGIOGRAPHY;  Surgeon: Wonda Sharper, MD;  Location: [facility] INVASIVE CV LAB;  Service: Cardiovascular;  Laterality: N/A;   TUBAL LIGATION  1990    Family Psychiatric History: Brother schizophrenia and bipolar, sister bipolar disorder  Family History:  Family History  Problem Relation Age of Onset   Diabetes Mother    Cancer Mother        liver--not clear if this was the primary   COPD Mother    Bipolar disorder Sister    Diabetes Sister    Schizophrenia Brother    Bipolar disorder Brother    Cancer Brother        Not sure of primary   Cancer Maternal Aunt        Breast cancer   Breast cancer Maternal Aunt        Cause of death   Cancer Maternal Aunt        pancreatic   Cancer Maternal Grandfather        throat   Colon cancer Neg Hx    Colon polyps Neg Hx    Esophageal cancer Neg Hx    Stomach cancer Neg Hx    Rectal cancer Neg Hx     Social History:  Social History   Socioeconomic History   Marital status: Married    Spouse name: Vinie Kerns   Number of children: 3   Years of education: Not on file   Highest education level: GED or equivalent  Occupational History   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  Was drinking every day prior.  Was drinking due to stress.She and husband drink every evening.  Her counselor, Page, at [employer] is aware of her drinking.   Drug use: Not Currently    Types: Marijuana, Crack cocaine    Comment: Last cocaine  use was 30 days ago.  No MJ for 2 months.   Sexual activity: Yes    Birth control/protection: Surgical  Other Topics Concern   Not on file  Social History Narrative   She and her husband now live in a home with her daughter and daughter's 2 children as well as patient's brother, who is just out of a supervised living situation for those living with Mental illness.  He has schizophrenia.  Previously lived with daughter and daughter's partner--lot of stress as they split up and finding this new home in November.        Born and raised in [place]    Social Drivers of [employer]: Low  Risk  (01/23/2022)   Overall Financial Resource Strain (CARDIA)    Difficulty of Paying Living Expenses: Not very hard  Food Insecurity: No Food Insecurity (11/29/2023)   Hunger Vital Sign    Worried About Running Out of Food in the Last Year: Never true    Ran Out of Food in the Last Year: Never true  Transportation Needs: No Transportation Needs (11/29/2023)   PRAPARE - Administrator, Civil Service (Medical): No    Lack of Transportation (Non-Medical): No  Physical Activity: Not on file  Stress: Not on file  Social Connections: Not on file    Allergies:  Allergies  Allergen Reactions   Penicillins Hives   Sulfonamide Derivatives Hives    Metabolic Disorder Labs: Lab Results  Component Value Date   HGBA1C 6.1 (H) 11/29/2023   MPG 128.37 11/29/2023   MPG 119.76 07/03/2018   No results found for: PROLACTIN Lab Results  Component Value Date   CHOL 155 05/20/2023   TRIG 188 (H) 05/20/2023   HDL 51 05/20/2023   CHOLHDL 4.3 07/03/2018   VLDL 31 07/03/2018   LDLCALC 73 05/20/2023   LDLCALC 110 (H) 03/19/2023   Lab Results  Component Value Date   TSH 0.828 05/23/2012    Therapeutic Level Labs: No results found for: LITHIUM No results found for: VALPROATE No results found for: CBMZ  Current Medications: Current Outpatient Medications   Medication Sig Dispense Refill   atorvastatin  (LIPITOR ) 80 MG tablet Take 1 tablet (80 mg total) by mouth daily. 30 tablet 11   Blood Pressure Monitoring (CLEVER CHOICE BP MONITOR/ARM) DEVI Use to check blood pressure 3 times weekly and as needed 1 each 0   carvedilol  (COREG ) 3.125 MG tablet Take 1 tablet (3.125 mg total) by mouth 2 (two) times daily with a meal. (Patient taking differently: Take 3.125 mg by mouth daily.) 60 tablet 11   cetirizine  (ZYRTEC ) 10 MG tablet Take 1 tablet (10 mg total) by mouth daily. 30 tablet 11   ciprofloxacin  (CIPRO ) 500 MG tablet 1 tab by mouth twice daily for 7 days. 14 tablet 0   Glucose Blood (BLOOD GLUCOSE TEST STRIPS) STRP Check blood glucose twice daily before meals 100 strip 11   hydrOXYzine  (VISTARIL ) 50 MG capsule Take 1 capsule (50 mg total) by mouth every 6 (six) hours as needed. 120 capsule 3   isosorbide  mononitrate (IMDUR ) 30 MG 24 hr tablet Take 1 tablet (30 mg total) by mouth daily. (Patient not taking: Reported on 12/11/2023) 90 tablet 3   lamoTRIgine  (LAMICTAL ) 200 MG tablet Take 1 tablet (200 mg total) by mouth daily. 30 tablet 3   Lancets Misc. MISC Check blood glucose twice daily before meals 100 each 11   losartan  (COZAAR ) 50 MG tablet Take 1 tablet (50 mg total) by mouth daily. 30 tablet 11   metFORMIN  (GLUCOPHAGE -XR) 500 MG 24 hr tablet TAKE 1 TABLET BY MOUTH TWICE DAILY WITH MEALS 60 tablet 11   nitroGLYCERIN  (NITROSTAT ) 0.4 MG SL tablet Place 1 tablet (0.4 mg total) under the tongue every 5 (five) minutes as needed for chest pain. 25 tablet 1   pantoprazole  (PROTONIX ) 40 MG tablet Take 1 tablet (40 mg total) by mouth 2 (two) times daily. 180 tablet 0   QUEtiapine  (SEROQUEL ) 100 MG tablet Take 1 tablet (100 mg total) by mouth at bedtime. 30 tablet 3   No current facility-administered medications for this visit.     Musculoskeletal: Strength & Muscle Tone:  Unable to  assess due to telephone visit Gait & Station:  Unable to assess due  to telephone visit Patient leans: N/A  Psychiatric Specialty Exam: Review of Systems  Last menstrual period 06/09/2018.There is no height or weight on file to calculate BMI.  General Appearance:  Unable to assess due to telephone visit  Eye Contact:   Unable to assess due to telephone visit  Speech:  Clear and Coherent and Normal Rate  Volume:  Normal  Mood:  Euthymic  Affect:  Congruent  Thought Process:  Coherent, Goal Directed, and Linear  Orientation:  Full (Time, Place, and Person)  Thought Content: WDL and Logical   Suicidal Thoughts:  No  Homicidal Thoughts:  No  Memory:  Immediate;   Good Recent;   Good Remote;   Good  Judgement:  Good  Insight:  Good  Psychomotor Activity:   Unable to assess due to telephone visit  Concentration:  Concentration: Good and Attention Span: Good  Recall:  Good  Fund of Knowledge: Good  Language: Good  Akathisia:  No  Handed:  Right  AIMS (if indicated): not done  Assets:  Communication Skills Desire for Improvement Housing Leisure Time Social Support  ADL's:  Intact  Cognition: WNL  Sleep:  Good   Screenings: CAGE-AID    Flowsheet Row ED to Hosp-Admission (Discharged) from 11/28/2023 in [facility]  CAGE-AID Score 0      GAD-7    Flowsheet Row Clinical Support from 10/07/2023 in [facility] Clinical Support from 07/08/2023 in [facility] Video Visit from 01/15/2023 in [facility] Video Visit from 02/06/2022 in [facility] Clinical Support from 11/12/2021 in [facility]  Total GAD-7 Score 17 20 20 16 21       PHQ2-9    Flowsheet Row Clinical Support from 10/07/2023 in [facility] Clinical Support from 07/08/2023 in [facility] Video Visit from 01/15/2023 in [facility] Video Visit  from 02/06/2022 in [facility] Clinical Support from 11/12/2021 in [facility]  PHQ-2 Total Score 3 2 5 5 6   PHQ-9 Total Score 12 7 24 7 24       Flowsheet Row ED to Hosp-Admission (Discharged) from 11/28/2023 in [facility] ED from 11/27/2023 in [facility] Emergency Department at [facility] Admission (Discharged) from 05/06/2023 in [facility] CARDIAC CATH LAB  C-SSRS RISK CATEGORY No Risk No Risk No Risk        Assessment and Plan: Patient reports that mentally she is doing well.  Clinically she is improving after her hospitalization. Overall patient notes that she is doing well.  No medication changes made today.  Patient agreeable to continue medication as prescribed.  Patient encouraged to come into the office at her next visit if she is unable to login virtually.  1. Bipolar I disorder, most recent episode depressed (HCC)  Continue- hydrOXYzine  (VISTARIL ) 50 MG capsule; Take 1 capsule (50 mg total) by mouth every 6 (six) hours as needed.  Dispense: 120 capsule; Refill: 3 Continue- lamoTRIgine  (LAMICTAL ) 200 MG tablet; Take 1 tablet (200 mg total) by mouth daily.  Dispense: 30 tablet; Refill: 3 Continue- QUEtiapine  (SEROQUEL ) 100 MG tablet; Take 1 tablet (100 mg total) by mouth at bedtime.  Dispense: 30 tablet; Refill: 3   Follow-up in 2 months Zane FORBES Bach, NP 12/31/2023,  1:31 PM

## 2024-01-05 ENCOUNTER — Ambulatory Visit: Payer: MEDICAID | Admitting: Occupational Therapy

## 2024-01-05 ENCOUNTER — Ambulatory Visit: Payer: MEDICAID

## 2024-01-05 VITALS — BP 94/66 | HR 80

## 2024-01-05 DIAGNOSIS — R278 Other lack of coordination: Secondary | ICD-10-CM

## 2024-01-05 DIAGNOSIS — M6281 Muscle weakness (generalized): Secondary | ICD-10-CM

## 2024-01-05 DIAGNOSIS — R2689 Other abnormalities of gait and mobility: Secondary | ICD-10-CM

## 2024-01-05 DIAGNOSIS — R29818 Other symptoms and signs involving the nervous system: Secondary | ICD-10-CM

## 2024-01-05 DIAGNOSIS — R208 Other disturbances of skin sensation: Secondary | ICD-10-CM

## 2024-01-05 DIAGNOSIS — R293 Abnormal posture: Secondary | ICD-10-CM

## 2024-01-05 NOTE — Patient Instructions (Addendum)
 Cheyenne Fowler

## 2024-01-05 NOTE — Therapy (Signed)
 OUTPATIENT PHYSICAL THERAPY NEURO TREATMENT/ DISCHARGE SUMMARY   [name] MRN: [id number] DOB:Sep 02, 1966, 58 y.o., female Today's Date: 01/06/2024   PCP: Almarie Bolds, MD REFERRING PROVIDER: Eric Austria, DO  PHYSICAL THERAPY DISCHARGE SUMMARY  Visits from Start of Care: 4  Current functional level related to goals / functional outcomes: Reports being baseline   Remaining deficits: Reports being baseline   Education / Equipment: PT POC, HEP, slow return to activity, BP management   Patient agrees to discharge. Patient goals were met. Patient is being discharged due to meeting the stated rehab goals.  END OF SESSION:  PT End of Session - 01/05/24 1508     Visit Number 4    Number of Visits 13    Date for PT Re-Evaluation 02/06/24    Authorization Type [employer]    Progress Note Due on Visit 10    PT Start Time 1527    PT Stop Time 1543    PT Time Calculation (min) 16 min    Activity Tolerance Patient tolerated treatment well    Behavior During Therapy WFL for tasks assessed/performed              Past Medical History:  Diagnosis Date   Anemia    Bipolar disorder (HCC)    Diabetes mellitus (HCC) 2022   type 2   Environmental and seasonal allergies 1968   GERD (gastroesophageal reflux disease)    Heart murmur    Hypertension    Upper GI bleed 01/15/2022   Past Surgical History:  Procedure Laterality Date   BIOPSY  01/17/2022   Procedure: BIOPSY;  Surgeon: Eda Iha, MD;  Location: [facility] ENDOSCOPY;  Service: Gastroenterology;;   CESAREAN SECTION  1982, 1983, 1990   ESOPHAGOGASTRODUODENOSCOPY (EGD) WITH PROPOFOL  N/A 01/17/2022   Procedure: ESOPHAGOGASTRODUODENOSCOPY (EGD) WITH PROPOFOL ;  Surgeon: Eda Iha, MD;  Location: [facility] ENDOSCOPY;  Service: Gastroenterology;  Laterality: N/A;   HERNIA REPAIR     Umbilical hernia in the 2nd grade   LEFT HEART CATH AND CORONARY ANGIOGRAPHY N/A 05/06/2023   Procedure: LEFT  HEART CATH AND CORONARY ANGIOGRAPHY;  Surgeon: Wonda Sharper, MD;  Location: [facility] INVASIVE CV LAB;  Service: Cardiovascular;  Laterality: N/A;   TUBAL LIGATION  1990   Patient Active Problem List   Diagnosis Date Noted   Acute transverse myelitis (HCC) 11/29/2023   HTN (hypertension) 11/29/2023   Right lumbar radiculopathy 06/02/2023   Plantar fasciitis of right foot 06/02/2023   Primary hypertension 01/23/2022   Acute esophagitis    Gastritis and gastroduodenitis    Hematemesis 01/16/2022   Type 2 diabetes mellitus (HCC) 01/15/2022   Hypertension associated with diabetes (HCC) 01/15/2022   Alcohol use 01/15/2022   Hyperlipidemia associated with type 2 diabetes mellitus (HCC) 01/15/2022   Extravasation of intravenous contrast medium 01/15/2022   Cocaine use disorder, mild, abuse (HCC) 06/15/2020   Bipolar I disorder, most recent episode depressed (HCC) 06/14/2020   Menopausal syndrome 05/30/2020   Bipolar disorder (HCC)    Prediabetes    Tooth infection 08/27/2018   Hemoptysis 08/27/2018   Blood in stool 07/23/2018   Chest pain of uncertain etiology 07/13/2012   Depression 07/13/2012   Tobacco abuse 07/13/2012   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    Class: Acute   Polysubstance (excluding opioids) dependence (HCC) 05/22/2012    Class: Acute   Breast tenderness in female 03/18/2012   Health care maintenance 03/18/2012   DEPRESSION 08/17/2007    ONSET DATE: 12/01/2023  REFERRING DIAG:  G37.3 (ICD-10-CM) - Acute transverse myelitis (HCC)  M79.601 (ICD-10-CM) - Right arm pain  R20.0 (ICD-10-CM) - Numbness  R53.1 (ICD-10-CM) - Weakness    THERAPY DIAG:  Muscle weakness (generalized)  Other abnormalities of gait and mobility  Abnormal posture  Other lack of coordination  Rationale for Evaluation and Treatment: Rehabilitation  SUBJECTIVE:                                                                                                                                                                                              SUBJECTIVE STATEMENT: Patient reports doing fair. Feels like doing nothing today- denies acute illness. Feels as though her leg is fine.Denies falls. Agreeable to DC.   Pt accompanied by: self  PERTINENT HISTORY: anemia, bipolar, DM, GERD, HTN, UGI bleed  PAIN:  Are you having pain? Yes: NPRS scale: 7/10 Pain location: R shoulder and between shoulder blades Pain description: numb, sore Aggravating factors: using R arm Relieving factors: not using R arm  PRECAUTIONS: Fall   PATIENT GOALS: to not fall  OBJECTIVE:  Note: Objective measures were completed at Evaluation unless otherwise noted.  DIAGNOSTIC FINDINGS: 11/28/23 cspine MRI Narrative & Impression  CLINICAL DATA:  Right arm pain, numbness and weakness   EXAM: MRI CERVICAL SPINE WITHOUT AND WITH CONTRAST   TECHNIQUE: Multiplanar and multiecho pulse sequences of the cervical spine, to include the craniocervical junction and cervicothoracic junction, were obtained without and with intravenous contrast.   CONTRAST:  10mL GADAVIST  GADOBUTROL  1 MMOL/ML IV SOLN   COMPARISON:  None Available.   FINDINGS: Alignment: Physiologic.   Vertebrae: No fracture, evidence of discitis, or bone lesion.   Cord: There is hyperintense T2-weighted signal within the ventral spinal cord, asymmetric to the right at the C3-6 levels. No abnormal contrast enhancement.   Posterior Fossa, vertebral arteries, paraspinal tissues: Negative.   Disc levels:   C1-2: Unremarkable.   C2-3: Normal disc space and facet joints. There is no spinal canal stenosis. No neural foraminal stenosis.   C3-4: Intermediate sized disc osteophyte complex. There is no spinal canal stenosis. Mild bilateral neural foraminal stenosis.   C4-5: Normal disc space and facet joints. There is no spinal canal stenosis. No neural foraminal stenosis.   C5-6: Small disc  bulge. There is no spinal canal stenosis. No neural foraminal stenosis.   C6-7: Left asymmetric disc bulge with bilateral uncovertebral hypertrophy. Mild spinal canal stenosis. Severe left neural foraminal stenosis.   C7-T1: Normal disc space and facet joints. There is no spinal canal stenosis. No neural foraminal stenosis.   IMPRESSION: 1. Hyperintense T2-weighted signal within the  ventral spinal cord, asymmetric to the right at the C3-6 levels. This is most consistent with transverse myelitis or other nonspecific demyelinating process. 2. Mild spinal canal stenosis and severe left neural foraminal stenosis at C6-7.                                                                                                                                TREATMENT TherAct Vitals:   01/05/24 1537  BP: 94/66  Pulse: 80   Theract: ABC: 72%  PATIENT EDUCATION: Education details: PT POC, exam findings, BP management Person educated: Patient Education method: Explanation Education comprehension: verbalized understanding and needs further education  HOME EXERCISE PROGRAM: Access Code: [email or web address] URL: [email or web address] Date: 12/22/2023 Prepared by: Delon Pop  Exercises - Supine Bridge  - 1 x daily - 7 x weekly - 3 sets - 10 reps - Standing Hip Abduction with Counter Support  - 1 x daily - 7 x weekly - 3 sets - 10 reps - Standing Hip Extension with Counter Support  - 1 x daily - 7 x weekly - 3 sets - 10 reps - Standing Knee Flexion with Counter Support  - 1 x daily - 7 x weekly - 3 sets - 10 reps - Standing March with Unilateral Counter Support  - 1 x daily - 7 x weekly - 3 sets - 10 reps - Heel Toe Raises with Unilateral Counter Support  - 1 x daily - 7 x weekly - 3 sets - 10 reps  You Can Walk For A Certain Length Of Time Each Day                          Walk 5 minutes 2 times per day.             Increase 2  minutes every 5 days              Work up to 30  minutes (1-2 times per day).               Example:                         Day 1-2           4-5 minutes     3 times per day                         Day 7-8           10-12 minutes 2-3 times per day                         Day 13-14       20-22 minutes 1-2 times per day   GOALS: Goals reviewed with patient? Yes  SHORT TERM GOALS: Target date: 01/02/24  Pt will be independent with  initial HEP for improved functional strength and endurance  Baseline: to be provided Goal status: INITIAL  2.  Patient will improve ABC score to >/= 45% to demonstrate improved confidence with functional mobility  Baseline: 37%; 72% Goal status: MET  3.  TUG goal Baseline: not indicated based on initial results Goal status: REVISED  4.  5xSTS goal  Baseline: not indicated based on initial results Goal status: REVISED  5.  goal Baseline: not indicated based on initial results Goal status: REVISED   LONG TERM GOALS: Target date: 02/06/24  Pt will be independent with final HEP for improved functional strength and endurance  Baseline: to be provided; provided Goal status: MET  Patient will improve ABC score to >/= 55% to demonstrate improved confidence with functional mobility  Baseline: 37%; 72% Goal status: MET  TUG goal Baseline: not indicated based on initial results Goal status: REVISED  4.  5xSTS goal  Baseline: not indicated based on initial results Goal status: REVISED  5.   Patient will ambulate >/=549ft during to demonstrate improved community ambulation endurance Baseline: 412ft; deferred due to patient fatigue and lower BP with symptoms Goal status: DEFERRED  ASSESSMENT:  CLINICAL IMPRESSION: Patient seen for skilled PT session with emphasis on goal assessment and dc. She reports being back at her baseline as it pertains to her LE. She met 2/2 LTGs that were assessed. goal deferred due to patients relatively low BP and reports of fatigue. Discussed importance  of monitoring her BP and BG and f.u with appropriate provider. She verbalized understanding. Patient to dc from PT.     OBJECTIVE IMPAIRMENTS: Abnormal gait, decreased activity tolerance, decreased balance, decreased coordination, decreased endurance, decreased knowledge of condition, decreased knowledge of use of DME, difficulty walking, decreased strength, dizziness, impaired sensation, impaired UE functional use, postural dysfunction, and pain.   ACTIVITY LIMITATIONS: carrying, lifting, standing, squatting, stairs, transfers, bed mobility, bathing, dressing, locomotion level, and caring for others  PARTICIPATION LIMITATIONS: meal prep, cleaning, interpersonal relationship, driving, shopping, community activity, and occupation  PERSONAL FACTORS: Age, Behavior pattern, Past/current experiences, Social background, Time since onset of injury/illness/exacerbation, Transportation, and 3+ comorbidities: see above  are also affecting patient's functional outcome.   REHAB POTENTIAL: Good  CLINICAL DECISION MAKING: Stable/uncomplicated  EVALUATION COMPLEXITY: Low  PLAN:  PT FREQUENCY: 2x/week  PT DURATION: 6 weeks  PLANNED INTERVENTIONS: 97164- PT Re-evaluation, 97110-Therapeutic exercises, 97530- Therapeutic activity, 97112- Neuromuscular re-education, 97535- Self Care, [id number]- Manual therapy, [phone]- Gait training, [phone]- Orthotic Fit/training, [phone]- Aquatic Therapy, Patient/Family education, Balance training, Stair training, Dry Needling, Vestibular training, Visual/preceptual remediation/compensation, and DME instructions  PLAN FOR NEXT SESSION: dc from PT   Delon DELENA Pop, PT Delon DELENA Pop, PT, DPT, CBIS 01/06/2024, 7:45 AM     For all possible CPT codes, reference the Planned Interventions line above.     Check all conditions that are expected to impact treatment: {Conditions expected to impact treatment:Diabetes mellitus, Neurological condition and/or seizures, Psychological  or psychiatric disorders, and Social determinants of health   If treatment provided at initial evaluation, no treatment charged due to lack of authorization.

## 2024-01-05 NOTE — Therapy (Signed)
 OUTPATIENT OCCUPATIONAL THERAPY NEURO Treatment  [name] MRN: [id number] DOB:[birth year], 58 y.o., female Today's Date: 01/05/2024  PCP: Adella Norris, MD  REFERRING PROVIDER: [place], Eric J, DO   END OF SESSION:  OT End of Session - 01/05/24 1650     Visit Number 4    Number of Visits 13   including evaluation   Date for OT Re-Evaluation 02/06/24    Authorization Type [employer]    OT Start Time 1448    OT Stop Time 1526    OT Time Calculation (min) 38 min    Activity Tolerance Patient tolerated treatment well    Behavior During Therapy [facility] for tasks assessed/performed                Past Medical History:  Diagnosis Date   Anemia    Bipolar disorder (HCC)    Diabetes mellitus (HCC) 2022   type 2   Environmental and seasonal allergies 1968   GERD (gastroesophageal reflux disease)    Heart murmur    Hypertension    Upper GI bleed 01/15/2022   Past Surgical History:  Procedure Laterality Date   BIOPSY  01/17/2022   Procedure: BIOPSY;  Surgeon: Eda Iha, MD;  Location: [facility] ENDOSCOPY;  Service: Gastroenterology;;   CESAREAN SECTION  1982, 1983, 1990   ESOPHAGOGASTRODUODENOSCOPY (EGD) WITH PROPOFOL  N/A 01/17/2022   Procedure: ESOPHAGOGASTRODUODENOSCOPY (EGD) WITH PROPOFOL ;  Surgeon: Eda Iha, MD;  Location: [facility] ENDOSCOPY;  Service: Gastroenterology;  Laterality: N/A;   HERNIA REPAIR     Umbilical hernia in the 2nd grade   LEFT HEART CATH AND CORONARY ANGIOGRAPHY N/A 05/06/2023   Procedure: LEFT HEART CATH AND CORONARY ANGIOGRAPHY;  Surgeon: Wonda Sharper, MD;  Location: [facility] INVASIVE CV LAB;  Service: Cardiovascular;  Laterality: N/A;   TUBAL LIGATION  1990   Patient Active Problem List   Diagnosis Date Noted   Acute transverse myelitis (HCC) 11/29/2023   HTN (hypertension) 11/29/2023   Right lumbar radiculopathy 06/02/2023   Plantar fasciitis of right foot 06/02/2023   Primary hypertension 01/23/2022   Acute  esophagitis    Gastritis and gastroduodenitis    Hematemesis 01/16/2022   Type 2 diabetes mellitus (HCC) 01/15/2022   Hypertension associated with diabetes (HCC) 01/15/2022   Alcohol use 01/15/2022   Hyperlipidemia associated with type 2 diabetes mellitus (HCC) 01/15/2022   Extravasation of intravenous contrast medium 01/15/2022   Cocaine use disorder, mild, abuse (HCC) 06/15/2020   Bipolar I disorder, most recent episode depressed (HCC) 06/14/2020   Menopausal syndrome 05/30/2020   Bipolar disorder (HCC)    Prediabetes    Tooth infection 08/27/2018   Hemoptysis 08/27/2018   Blood in stool 07/23/2018   Chest pain of uncertain etiology 07/13/2012   Depression 07/13/2012   Tobacco abuse 07/13/2012   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    Class: Acute   Polysubstance (excluding opioids) dependence (HCC) 05/22/2012    Class: Acute   Breast tenderness in female 03/18/2012   Health care maintenance 03/18/2012   DEPRESSION 08/17/2007    ONSET DATE: 12/01/2023  (referral date)  REFERRING DIAG:  G37.3 (ICD-10-CM) - Acute transverse myelitis (HCC)  R29.898 (ICD-10-CM) - Right arm weakness  R20.0 (ICD-10-CM) - Numbness  R53.1 (ICD-10-CM) - Weakness    THERAPY DIAG:  Muscle weakness (generalized)  Other lack of coordination  Other symptoms and signs involving the nervous system  Other disturbances of skin sensation  Rationale for Evaluation and Treatment: Rehabilitation  SUBJECTIVE:   SUBJECTIVE  STATEMENT: Pt reported feeling aching pain of R arm and hand. Pt reported feeling under the weather today.   Pt reported R arm looks and feels swollen.  Pt reported completing HEP.  Pt reported trying to cook recently and switched to L hand for meal prep d/t difficulty stirring with R hand.  Pt reported not yet ordered tub bench or shower chair though looking into options with family members and reported intent to f/u with case manager as  well.  Pt accompanied by: self  PERTINENT HISTORY: cocaine use, NIDDM, PTSD/anxiety, HTN, hyperlipidemia, CAD, DM II, bipolar disorder, GERD  Per 12/11/23 MD Progress Notes: onset of right arm weakness and some right arm pain when she woke up November 26, 2023.  She also had right arm and foot numbness.  Symptoms persisted so she went to the ED.   She was positive for cocaine in her urine tox screen but states she had not used it within a week of the event...  Imaging: MRI of the head 11/28/2023 was normal for age showing a couple T2/FLAIR hyperintense foci in the subcortical or deep white matter consistent with mild chronic microvascular ischemic change.   MRI of the cervical spine 11/28/2023 showed a T2 hyperintense focus from C2-C3 to C6.  It was central but more to the right adjacent to C3.  Central gray may have been more involved adjacent to C5.  The focus did not enhance.  Mild spinal stenosis moderately severe left foraminal narrowing is noted at C6-C7.  Foraminal narrowing noted at C3-C4.  Potential for right C4 and left C7 nerve root compression.   MRI of the thoracic spine 11/30/2023 was normal.   MRI of the brachial plexus 11/28/2023 (report) was normal.  PRECAUTIONS: Fall  WEIGHT BEARING RESTRICTIONS: No  PAIN:  Are you having pain? Yes: NPRS scale: 8/10 Pain location: R arm and hand, neck Pain description: aching Aggravating factors: lifting R arm and picking up items Relieving factors: Gabapentin , rest  FALLS: Has patient fallen in last 6 months? Yes. Number of falls 1, per pt  Pt fell 2 days ago trying to get off toilet d/t arm slipped.  Pt reported LOB episode when showering.     LIVING ENVIRONMENT: Lives with: lives with their family and lives with their spouse Lives in: House/apartment Stairs: Yes: Internal: 12 steps; on right going up and External: 2 steps; none Has following equipment at home: Grab bars  PLOF: Independent with basic ADLs, household  management (cleaning, sweeping, laundry), grocery shopping, driving  PATIENT GOALS: getting better and being able to lift my arm as much as possible... being able to not feel numbness in my hand... being able to stand up and sit down without being dizzy.  OBJECTIVE:  Note: Objective measures were completed at Evaluation unless otherwise noted.  HAND DOMINANCE: Right  ADLs: Overall ADLs: ind with L hand compensation Transfers/ambulation related to ADLs: Eating: ind with L hand compensating Grooming: ind with L hand compensating, assistance for styling hair UB Dressing: assistance from family members, unable to get sleeve over R shoulder LB Dressing: assistance from family members Toileting: ind wiping, assistance from family members Bathing: sponge bathing, pt reported concerns about safety with showering d/t falls Tub Shower transfers: unable to get in/out of shower Equipment:  tub/shower  IADLs: Shopping: daughter orders groceries Light housekeeping: family assistance, pt completes laundry seated Meal Prep: family assistance Community mobility: family assistance and case manager assistance for attending medical appointments Handwriting: 100% legible, Pt reported handwriting  looks different and much slower since onset of R arm weakness.  Pt required more than 1 minute to write x1 simple sentence (5 word sentence).  MOBILITY STATUS: Hx of falls and ambulates without A/E during eval  POSTURE COMMENTS:  forward head Sitting balance:  ind  ACTIVITY TOLERANCE: Activity tolerance: Pt reported increased fatigue since symptoms started, especially during mid-afternoon.  FUNCTIONAL OUTCOME MEASURES: Upper Extremity Functional Index (UEFI): 13    UPPER EXTREMITY ROM:    Active ROM Right eval Left eval  Shoulder flexion 40* (PROM - WFL) WFL  Shoulder abduction    Shoulder adduction 50* (PROM - WFL)   Shoulder extension    Shoulder internal rotation    Shoulder external  rotation    Elbow flexion 105* (PROM - WFL)   Elbow extension WFL   Wrist flexion WFL   Wrist extension WFL   Wrist ulnar deviation    Wrist radial deviation    Wrist pronation WFL   Wrist supination WFL though decreased compared to LUE   (Blank rows = not tested)  Pt demo'd slow movements of RUE during AROM.  Pt demo'd significant compensatory muscle recruitment at R shoulder when attempting R shoulder flex/ABD. Pt required mod v/c to relax arm for PROM shoulder flex/ABD d/t pt potentially anticipating pain. Pt reported no pain with PROM shoulder flex/ABD though some pain when pt activated shoulder muscles in anticipatory muscle recruitment despite v/c to relax arm.  HAND FUNCTION: Grip strength: Right: 39.9 lbs without dynamometer supported on tabletop (46.6 with dynamometer supported on tabletop); Left: 46.2 lbs  COORDINATION: 9 Hole Peg test: Right: 43 sec; Left: 30 sec  SENSATION: Numbness and tingling of R fingers, digits 2-5 (excluding thumb).  EDEMA: No swelling noted  MUSCLE TONE: RUE: Within functional limits and LUE: Within functional limits  COGNITION: Overall cognitive status:  able to answer questions appropriately. Pt reported difficulty remembering things, such as preparing tea then forgetting that tea was preparing and needing to complete tasks immediately or forgetting.  ?Possible poor historian - Pt sometimes demo'd inconsistent reporting of recent events possibly d/t forgetting though pt confirmed events occurred if OT mentioned an event.  VISION: Subjective report: Pt reported no changes to vision Baseline vision: Wears glasses for reading only   PERCEPTION: Not tested  PRAXIS: Not tested  OBSERVATIONS:   Pt was pleasant. Pt ambulated ind without A/E. Pt leaned heavily to L side on L arm when using R hand for functional tasks (e.g. when completing 9-hole peg test).    TODAY'S TREATMENT:                                                                                                                               DATE:   [employer]Act OT initiated FM coordination HEP (handout provided, see pt instructions) - to improve R UE FM coordination, dexterity, proprioception. Pt returned demonstration of each exercise: Picking up objects and placing in a container with  slot Stacking towers of coins  Finger-to-palm then palm-to-finger translation of small items Shuffling cards Turning cards over 1 at a time Hold deck of cards in palm of hand and push off 1 card at a time from the top of the deck using only thumb Tear piece of tissue and rolling into small balls with fingertips Meditation balls - clockwise then counterclockwise Rolling pen between fingers and thumb, pen twirl (rotation), pen shift (translation)   OT educated pt on importance of using R hand, including for meal prep tasks, and providing input to R hand to improve outcomes and decrease pain. OT educated pt on option to take breaks when R hand fatigues by using L hand but then return to using R hand. Pt verbalized understanding.  Per pt request, OT reviewed shoulder circles, scapular retraction, and shoulder shrugs from established HEP. Pt returned demonstration of exercises. Pt politely declined an additional copy of HEP.  OT educated pt on pain management strategies using heat pack. Handout provided, see pt instructions. Pt verbalized understanding. OT educated pt on importance of monitoring skin integrity during heat pack d/t decreased sensation of affected UE. Pt verbalized understanding.  PATIENT EDUCATION: Education details: see today's tx above Person educated: Patient Education method: Explanation, Demonstration, and Handouts Education comprehension: verbalized understanding, returned demonstration, and needs further education  HOME EXERCISE PROGRAM: 12/29/23 - shower chair and shower bench (handout), shoulder AROM and pink theraputty HEP; Access Code: [facility] 01/05/24 - FM  coordination HEP (Handout provided, see pt instructions.)    GOALS: Goals reviewed with patient? Yes  SHORT TERM GOALS: Target date: 01/09/24  Patient will demonstrate initial RUE HEP with 25% verbal cues or less for proper execution.  Baseline: new to outpt OT Goal status: INITIAL  2.  Patient will demonstrate at least 5 lbs increase in RUE grip strength, unsupported on tabletop, as needed to open jars and other containers.  Baseline: Grip strength: Right: 39.9 lbs without dynamometer supported on tabletop; Left: 46.2 lbs Goal status: INITIAL  3.  Patient will demo improved FM coordination as evidenced by completing nine-hole peg with use of RUE in 37 seconds or less.  Baseline: 9 Hole Peg test: Right: 43 sec; Left: 30 sec Goal status: INITIAL  4.  Pt will verbalize understanding of at least 3 memory compensation strategies and activities to keep thinking skills sharp. Baseline: new to outpt OT, pt reported difficulty with memory since symptoms began Goal status: INITIAL  5.  Pt will demo improved AROM of R shoulder as evidenced by shoulder flex and shoulder ABD to 70*. Baseline:  R Shoulder: 40* flex, 50* ABD Goal status: INITIAL  6.  Pt will report incorporation of RUE for functional tasks, including sweeping and folding laundry in seated position PRN. Baseline: Pt reported primarily using LUE to compensate for daily tasks and completing laundry tasks while seated. Goal status: INITIAL  7. Pt will independently recall at least 3 sensory precautions to increase safety d/t RUE numbness. Baseline: new to outpt OT Goal status: INITIAL  LONG TERM GOALS: Target date: 02/06/24  Patient will demonstrate updated RUE HEP with 25% verbal cues or less for proper execution.  Baseline: new to outpt OT Goal status: INITIAL  2.  Pt will report at least 9 point increase with UEFI score (reporting at least 22 points) indicating significantly improved functional use of affected UE. Baseline:  UEFI - 13 Goal status: INITIAL  3.   Patient will demo improved FM coordination as evidenced by completing nine-hole  peg with use of RUE in 32 seconds or less.  Baseline: 9 Hole Peg test: Right: 43 sec; Left: 30 sec Goal status: INITIAL  4.  Pt will demo improved coordination for handwriting as evidenced by writing simple sentence (no more than 5 words) in 40 seconds or less. Baseline: Pt demo'd 100% legibility though required more than 1 minute to write simple 5-word sentence. Pt reported handwriting is slower and not the same as before onset of symptoms. Goal status: INITIAL  5.  Pt will verbalize understanding of A/E for bathing tasks, including shower chair and tub/transfer bench, to reduce fall risk and improve ind for showering tasks. Baseline: pt does not have shower chair or shower bench Goal status: INITIAL  6.  Pt will demo understanding of adaptive strategies and A/E for UB dressing, LB dressing, and bathing tasks. Baseline: Pt reported difficulty with UB dressing, LB dressing, and bathing tasks. Pt reported currently sponge-bathing and requiring assistance for dressing tasks. Goal status: INITIAL  ASSESSMENT:  CLINICAL IMPRESSION: Pt tolerated tasks well. Pt demo'd understanding of updated HEP. Pt would benefit from skilled OT services in the outpatient setting to work on impairments as noted below to increase ind for functional tasks, improve RUE strength and coordination, to work towards improved AROM of RUE, and to promote participation in ADL/IADL tasks.  PERFORMANCE DEFICITS: in functional skills including ADLs, IADLs, coordination, dexterity, proprioception, sensation, ROM, strength, pain, flexibility, Fine motor control, Gross motor control, mobility, balance, body mechanics, endurance, decreased knowledge of use of DME, and UE functional use, cognitive skills including energy/drive, memory, and sequencing, and psychosocial skills including environmental adaptation.    IMPAIRMENTS: are limiting patient from ADLs, IADLs, work, leisure, and social participation.   CO-MORBIDITIES: may have co-morbidities  that affects occupational performance. Patient will benefit from skilled OT to address above impairments and improve overall function.  MODIFICATION OR ASSISTANCE TO COMPLETE EVALUATION: Min-Moderate modification of tasks or assist with assess necessary to complete an evaluation.  OT OCCUPATIONAL PROFILE AND HISTORY: Problem focused assessment: Including review of records relating to presenting problem.  CLINICAL DECISION MAKING: LOW - limited treatment options, no task modification necessary  REHAB POTENTIAL: Good  EVALUATION COMPLEXITY: Low    PLAN:  OT FREQUENCY: 2x/week  OT DURATION: 6 weeks (dates extended to allow for scheduling)  PLANNED INTERVENTIONS: 97168 OT Re-evaluation, 97535 self care/ADL training, 02889 therapeutic exercise, 97530 therapeutic activity, 97112 neuromuscular re-education, 97140 manual therapy, 97035 ultrasound, 97018 paraffin, 02960 fluidotherapy, 97032 electrical stimulation (manual), 97014 electrical stimulation unattended, 97760 Orthotics management and training, 02239 Splinting (initial encounter), [id number] Subsequent splinting/medication, manual lymph drainage, passive range of motion, functional mobility training, energy conservation, patient/family education, and DME and/or AE instructions  RECOMMENDED OTHER SERVICES: PT eval already completed  CONSULTED AND AGREED WITH PLAN OF CARE: Patient  PLAN FOR NEXT SESSION:   Assess progress towards STG next visit  Did pt choose to obtain shower chair or transfer tub bench?  Initiate FM coordination HEP  Handwriting practice and adaptive strategies  UB and LB dressing adaptive strategies and A/E  Review memory compensation strategies, sensory precautions, and shoulder AROM HEP, theraputty HEP PRN     For all possible CPT codes, reference the Planned  Interventions line above.     Check all conditions that are expected to impact treatment: {Conditions expected to impact treatment:Neurological condition and/or seizures   If treatment provided at initial evaluation, no treatment charged due to lack of authorization.     med   Sylva  FORBES Coder, OT 01/05/2024, 4:52 PM

## 2024-01-06 ENCOUNTER — Telehealth (HOSPITAL_COMMUNITY): Payer: Self-pay | Admitting: *Deleted

## 2024-01-06 NOTE — Telephone Encounter (Signed)
Fax received for prior authorization of Quetiapine 100mg . Submitted online with cover my meds. Awaiting decision.

## 2024-01-07 ENCOUNTER — Ambulatory Visit: Payer: MEDICAID

## 2024-01-07 ENCOUNTER — Ambulatory Visit: Payer: MEDICAID | Admitting: Occupational Therapy

## 2024-01-07 ENCOUNTER — Telehealth (HOSPITAL_COMMUNITY): Payer: Self-pay | Admitting: *Deleted

## 2024-01-07 NOTE — Telephone Encounter (Signed)
 Checked online with cover my meds for authorization of Quetiapine  100mg . Approved until 01/05/25. Called to notify pharmacy.

## 2024-01-09 ENCOUNTER — Ambulatory Visit (INDEPENDENT_AMBULATORY_CARE_PROVIDER_SITE_OTHER): Payer: Medicaid Other | Admitting: Clinical

## 2024-01-09 DIAGNOSIS — F3130 Bipolar disorder, current episode depressed, mild or moderate severity, unspecified: Secondary | ICD-10-CM

## 2024-01-12 ENCOUNTER — Ambulatory Visit: Payer: MEDICAID | Admitting: Physical Therapy

## 2024-01-12 ENCOUNTER — Ambulatory Visit: Payer: MEDICAID | Admitting: Occupational Therapy

## 2024-01-12 ENCOUNTER — Telehealth: Payer: Self-pay | Admitting: Occupational Therapy

## 2024-01-12 NOTE — Telephone Encounter (Signed)
Pt no-showed for OT appointment today. Therefore, OT called pt's listed mobile phone number [phone]). Pt reported forgetting about therapy appointment today d/t busy schedule. OT provided pt with date/time of next OT appointment. Pt verbalized understanding.

## 2024-01-14 ENCOUNTER — Ambulatory Visit: Payer: MEDICAID | Admitting: Physical Therapy

## 2024-01-14 ENCOUNTER — Ambulatory Visit: Payer: MEDICAID | Admitting: Occupational Therapy

## 2024-01-19 ENCOUNTER — Ambulatory Visit: Payer: MEDICAID

## 2024-01-19 ENCOUNTER — Ambulatory Visit (INDEPENDENT_AMBULATORY_CARE_PROVIDER_SITE_OTHER): Payer: MEDICAID | Admitting: Occupational Therapy

## 2024-01-19 VITALS — BP 122/64

## 2024-01-19 DIAGNOSIS — R208 Other disturbances of skin sensation: Secondary | ICD-10-CM

## 2024-01-19 DIAGNOSIS — R29818 Other symptoms and signs involving the nervous system: Secondary | ICD-10-CM

## 2024-01-19 DIAGNOSIS — R278 Other lack of coordination: Secondary | ICD-10-CM

## 2024-01-19 DIAGNOSIS — M6281 Muscle weakness (generalized): Secondary | ICD-10-CM

## 2024-01-19 NOTE — Therapy (Signed)
OUTPATIENT OCCUPATIONAL THERAPY NEURO Treatment  [name] MRN: [id number] DOB:June 18, 1966, 58 y.o., female Today's Date: 01/19/2024  PCP: Julieanne Manson, MD  REFERRING PROVIDER: [place], Eric J, DO   END OF SESSION:  OT End of Session - 01/19/24 1541     Visit Number 4    Number of Visits 13   including evaluation   Date for OT Re-Evaluation 02/06/24    Authorization Type [employer]    OT Start Time 1530    OT Stop Time 1540    OT Time Calculation (min) 10 min    Activity Tolerance Patient limited by lethargy    Behavior During Therapy [facility] for tasks assessed/performed                 Past Medical History:  Diagnosis Date   Anemia    Bipolar disorder (HCC)    Diabetes mellitus (HCC) 2022   type 2   Environmental and seasonal allergies 1968   GERD (gastroesophageal reflux disease)    Heart murmur    Hypertension    Upper GI bleed 01/15/2022   Past Surgical History:  Procedure Laterality Date   BIOPSY  01/17/2022   Procedure: BIOPSY;  Surgeon: Tressia Danas, MD;  Location: [facility] ENDOSCOPY;  Service: Gastroenterology;;   CESAREAN SECTION  1982, 1983, 1990   ESOPHAGOGASTRODUODENOSCOPY (EGD) WITH PROPOFOL N/A 01/17/2022   Procedure: ESOPHAGOGASTRODUODENOSCOPY (EGD) WITH PROPOFOL;  Surgeon: Tressia Danas, MD;  Location: [facility] ENDOSCOPY;  Service: Gastroenterology;  Laterality: N/A;   HERNIA REPAIR     Umbilical hernia in the 2nd grade   LEFT HEART CATH AND CORONARY ANGIOGRAPHY N/A 05/06/2023   Procedure: LEFT HEART CATH AND CORONARY ANGIOGRAPHY;  Surgeon: Tonny Bollman, MD;  Location: [facility] INVASIVE CV LAB;  Service: Cardiovascular;  Laterality: N/A;   TUBAL LIGATION  1990   Patient Active Problem List   Diagnosis Date Noted   Acute transverse myelitis (HCC) 11/29/2023   HTN (hypertension) 11/29/2023   Right lumbar radiculopathy 06/02/2023   Plantar fasciitis of right foot 06/02/2023   Primary hypertension 01/23/2022   Acute  esophagitis    Gastritis and gastroduodenitis    Hematemesis 01/16/2022   Type 2 diabetes mellitus (HCC) 01/15/2022   Hypertension associated with diabetes (HCC) 01/15/2022   Alcohol use 01/15/2022   Hyperlipidemia associated with type 2 diabetes mellitus (HCC) 01/15/2022   Extravasation of intravenous contrast medium 01/15/2022   Cocaine use disorder, mild, abuse (HCC) 06/15/2020   Bipolar I disorder, most recent episode depressed (HCC) 06/14/2020   Menopausal syndrome 05/30/2020   Bipolar disorder (HCC)    Prediabetes    Tooth infection 08/27/2018   Hemoptysis 08/27/2018   Blood in stool 07/23/2018   Chest pain of uncertain etiology 07/13/2012   Depression 07/13/2012   Tobacco abuse 07/13/2012   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    Class: Acute   Polysubstance (excluding opioids) dependence (HCC) 05/22/2012    Class: Acute   Breast tenderness in female 03/18/2012   Health care maintenance 03/18/2012   DEPRESSION 08/17/2007    ONSET DATE: 12/01/2023  (referral date)  REFERRING DIAG:  G37.3 (ICD-10-CM) - Acute transverse myelitis (HCC)  R29.898 (ICD-10-CM) - Right arm weakness  R20.0 (ICD-10-CM) - Numbness  R53.1 (ICD-10-CM) - Weakness    THERAPY DIAG:  Muscle weakness (generalized)  Other lack of coordination  Other symptoms and signs involving the nervous system  Other disturbances of skin sensation  Rationale for Evaluation and Treatment: Rehabilitation  SUBJECTIVE:  SUBJECTIVE STATEMENT: Pt c/o body aches and reported not feeling well. OT noted pt appeared lethargic. Pt reported that symptoms began yesterday evening.  Pt accompanied by: self  PERTINENT HISTORY: cocaine use, NIDDM, PTSD/anxiety, HTN, hyperlipidemia, CAD, DM II, bipolar disorder, GERD  Per 12/11/23 MD Progress Notes: "onset of right arm weakness and some right arm pain when she woke up November 26, 2023.  She also had right arm and foot numbness.  Symptoms  persisted so she went to the ED.   She was positive for cocaine in her urine tox screen but states she had not used it within a week of the event...  Imaging: MRI of the head 11/28/2023 was normal for age showing a couple T2/FLAIR hyperintense foci in the subcortical or deep white matter consistent with mild chronic microvascular ischemic change.   MRI of the cervical spine 11/28/2023 showed a T2 hyperintense focus from C2-C3 to C6.  It was central but more to the right adjacent to C3.  Central gray may have been more involved adjacent to C5.  The focus did not enhance.  Mild spinal stenosis moderately severe left foraminal narrowing is noted at C6-C7.  Foraminal narrowing noted at C3-C4.  Potential for right C4 and left C7 nerve root compression.   MRI of the thoracic spine 11/30/2023 was normal.   MRI of the brachial plexus 11/28/2023 (report) was normal."  PRECAUTIONS: Fall  WEIGHT BEARING RESTRICTIONS: No  PAIN:  Are you having pain? Yes: NPRS scale: 8/10 Pain location: R arm and hand, neck Pain description: aching Aggravating factors: lifting R arm and picking up items Relieving factors: Gabapentin, rest  FALLS: Has patient fallen in last 6 months? Yes. Number of falls 1, per pt  Pt fell 2 days ago trying to get off toilet d/t arm slipped.  Pt reported LOB episode when showering.     LIVING ENVIRONMENT: Lives with: lives with their family and lives with their spouse Lives in: House/apartment Stairs: Yes: Internal: 12 steps; on right going up and External: 2 steps; none Has following equipment at home: Grab bars  PLOF: Independent with basic ADLs, household management (cleaning, sweeping, laundry), grocery shopping, driving  PATIENT GOALS: "getting better and being able to lift my arm as much as possible... being able to not feel numbness in my hand... being able to stand up and sit down without being dizzy.  OBJECTIVE:  Note: Objective measures were completed at Evaluation  unless otherwise noted.  HAND DOMINANCE: Right  ADLs: Overall ADLs: ind with L hand compensation Transfers/ambulation related to ADLs: Eating: ind with L hand compensating Grooming: ind with L hand compensating, assistance for styling hair UB Dressing: assistance from family members, unable to get sleeve over R shoulder LB Dressing: assistance from family members Toileting: ind wiping, assistance from family members Bathing: sponge bathing, pt reported concerns about safety with showering d/t falls Tub Shower transfers: unable to get in/out of shower Equipment:  tub/shower  IADLs: Shopping: daughter orders groceries Light housekeeping: family assistance, pt completes laundry seated Meal Prep: family assistance Community mobility: family assistance and case manager assistance for attending medical appointments Handwriting: 100% legible, Pt reported handwriting looks different and much slower since onset of R arm weakness.  Pt required more than 1 minute to write x1 simple sentence (5 word sentence).  MOBILITY STATUS: Hx of falls and ambulates without A/E during eval  POSTURE COMMENTS:  forward head Sitting balance:  ind  ACTIVITY TOLERANCE: Activity tolerance: Pt reported increased fatigue since symptoms  started, especially during mid-afternoon.  FUNCTIONAL OUTCOME MEASURES: Upper Extremity Functional Index (UEFI): 13    UPPER EXTREMITY ROM:    Active ROM Right eval Left eval  Shoulder flexion 40* (PROM - WFL) WFL  Shoulder abduction    Shoulder adduction 50* (PROM - WFL)   Shoulder extension    Shoulder internal rotation    Shoulder external rotation    Elbow flexion 105* (PROM - WFL)   Elbow extension WFL   Wrist flexion WFL   Wrist extension WFL   Wrist ulnar deviation    Wrist radial deviation    Wrist pronation WFL   Wrist supination WFL though decreased compared to LUE   (Blank rows = not tested)  Pt demo'd slow movements of RUE during AROM.  Pt demo'd  significant compensatory muscle recruitment at R shoulder when attempting R shoulder flex/ABD. Pt required mod v/c to relax arm for PROM shoulder flex/ABD d/t pt potentially anticipating pain. Pt reported no pain with PROM shoulder flex/ABD though some pain when pt activated shoulder muscles in anticipatory muscle recruitment despite v/c to relax arm.  HAND FUNCTION: Grip strength: Right: 39.9 lbs without dynamometer supported on tabletop (46.6 with dynamometer supported on tabletop); Left: 46.2 lbs  COORDINATION: 9 Hole Peg test: Right: 43 sec; Left: 30 sec  SENSATION: Numbness and tingling of R fingers, digits 2-5 (excluding thumb).  EDEMA: No swelling noted  MUSCLE TONE: RUE: Within functional limits and LUE: Within functional limits  COGNITION: Overall cognitive status:  able to answer questions appropriately. Pt reported difficulty remembering things, such as preparing tea then forgetting that tea was preparing and needing to complete tasks immediately or forgetting.  ?Possible poor historian - Pt sometimes demo'd inconsistent reporting of recent events possibly d/t forgetting though pt confirmed events occurred if OT mentioned an event.  VISION: Subjective report: Pt reported no changes to vision Baseline vision: Wears glasses for reading only   PERCEPTION: Not tested  PRAXIS: Not tested  OBSERVATIONS:   Pt was pleasant. Pt ambulated ind without A/E. Pt leaned heavily to L side on L arm when using R hand for functional tasks (e.g. when completing 9-hole peg test).    TODAY'S TREATMENT:                                                                                                                              DATE:   Pt arrived, no charge. Pt reported not feeling well. OT assessed pt's BP, see vital signs. OT educated pt on BP management and managing cold/flu-like symptoms, including rest and drinking fluids. Pt verbalized understanding.  Per discussion with pt, pt chose  to return home today d/t feeling ill. Therefore, pt arrived, no charge.  PATIENT EDUCATION: Education details: see today's tx above Person educated: Patient Education method: Explanation, Demonstration, and Handouts Education comprehension: verbalized understanding, returned demonstration, and needs further education  HOME EXERCISE PROGRAM: 12/29/23 - shower chair and shower bench (handout), shoulder  AROM and pink theraputty HEP; Access Code: [facility] 01/05/24 - FM coordination HEP (Handout provided, see pt instructions.)    GOALS: Goals reviewed with patient? Yes  SHORT TERM GOALS: Target date: 01/09/24  Patient will demonstrate initial RUE HEP with 25% verbal cues or less for proper execution.  Baseline: new to outpt OT Goal status: INITIAL  2.  Patient will demonstrate at least 5 lbs increase in RUE grip strength, unsupported on tabletop, as needed to open jars and other containers.  Baseline: Grip strength: Right: 39.9 lbs without dynamometer supported on tabletop; Left: 46.2 lbs Goal status: INITIAL  3.  Patient will demo improved FM coordination as evidenced by completing nine-hole peg with use of RUE in 37 seconds or less.  Baseline: 9 Hole Peg test: Right: 43 sec; Left: 30 sec Goal status: INITIAL  4.  Pt will verbalize understanding of at least 3 memory compensation strategies and activities to keep thinking skills sharp. Baseline: new to outpt OT, pt reported difficulty with memory since symptoms began Goal status: INITIAL  5.  Pt will demo improved AROM of R shoulder as evidenced by shoulder flex and shoulder ABD to 70*. Baseline:  R Shoulder: 40* flex, 50* ABD Goal status: INITIAL  6.  Pt will report incorporation of RUE for functional tasks, including sweeping and folding laundry in seated position PRN. Baseline: Pt reported primarily using LUE to compensate for daily tasks and completing laundry tasks while seated. Goal status: INITIAL  7. Pt will independently  recall at least 3 sensory precautions to increase safety d/t RUE numbness. Baseline: new to outpt OT Goal status: INITIAL  LONG TERM GOALS: Target date: 02/06/24  Patient will demonstrate updated RUE HEP with 25% verbal cues or less for proper execution.  Baseline: new to outpt OT Goal status: INITIAL  2.  Pt will report at least 9 point increase with UEFI score (reporting at least 22 points) indicating significantly improved functional use of affected UE. Baseline: UEFI - 13 Goal status: INITIAL  3.   Patient will demo improved FM coordination as evidenced by completing nine-hole peg with use of RUE in 32 seconds or less.  Baseline: 9 Hole Peg test: Right: 43 sec; Left: 30 sec Goal status: INITIAL  4.  Pt will demo improved coordination for handwriting as evidenced by writing simple sentence (no more than 5 words) in 40 seconds or less. Baseline: Pt demo'd 100% legibility though required more than 1 minute to write simple 5-word sentence. Pt reported handwriting is slower and not the same as before onset of symptoms. Goal status: INITIAL  5.  Pt will verbalize understanding of A/E for bathing tasks, including shower chair and tub/transfer bench, to reduce fall risk and improve ind for showering tasks. Baseline: pt does not have shower chair or shower bench Goal status: INITIAL  6.  Pt will demo understanding of adaptive strategies and A/E for UB dressing, LB dressing, and bathing tasks. Baseline: Pt reported difficulty with UB dressing, LB dressing, and bathing tasks. Pt reported currently sponge-bathing and requiring assistance for dressing tasks. Goal status: INITIAL  ASSESSMENT:  CLINICAL IMPRESSION: Pt arrived, no charge today.  PERFORMANCE DEFICITS: in functional skills including ADLs, IADLs, coordination, dexterity, proprioception, sensation, ROM, strength, pain, flexibility, Fine motor control, Gross motor control, mobility, balance, body mechanics, endurance, decreased  knowledge of use of DME, and UE functional use, cognitive skills including energy/drive, memory, and sequencing, and psychosocial skills including environmental adaptation.   IMPAIRMENTS: are limiting patient from ADLs, IADLs,  work, leisure, and social participation.   CO-MORBIDITIES: may have co-morbidities  that affects occupational performance. Patient will benefit from skilled OT to address above impairments and improve overall function.  MODIFICATION OR ASSISTANCE TO COMPLETE EVALUATION: Min-Moderate modification of tasks or assist with assess necessary to complete an evaluation.  OT OCCUPATIONAL PROFILE AND HISTORY: Problem focused assessment: Including review of records relating to presenting problem.  CLINICAL DECISION MAKING: LOW - limited treatment options, no task modification necessary  REHAB POTENTIAL: Good  EVALUATION COMPLEXITY: Low    PLAN:  OT FREQUENCY: 2x/week  OT DURATION: 6 weeks (dates extended to allow for scheduling)  PLANNED INTERVENTIONS: 97168 OT Re-evaluation, 97535 self care/ADL training, 52841 therapeutic exercise, 97530 therapeutic activity, 97112 neuromuscular re-education, 97140 manual therapy, 97035 ultrasound, 97018 paraffin, 32440 fluidotherapy, 97032 electrical stimulation (manual), 97014 electrical stimulation unattended, 97760 Orthotics management and training, 10272 Splinting (initial encounter), [id number] Subsequent splinting/medication, manual lymph drainage, passive range of motion, functional mobility training, energy conservation, patient/family education, and DME and/or AE instructions  RECOMMENDED OTHER SERVICES: PT eval already completed  CONSULTED AND AGREED WITH PLAN OF CARE: Patient  PLAN FOR NEXT SESSION:   Assess progress towards STG next visit  Did pt choose to obtain shower chair or transfer tub bench?  Initiate FM coordination HEP  Handwriting practice and adaptive strategies  UB and LB dressing adaptive strategies and  A/E  Review memory compensation strategies, sensory precautions, and shoulder AROM HEP, theraputty HEP PRN     For all possible CPT codes, reference the Planned Interventions line above.     Check all conditions that are expected to impact treatment: {Conditions expected to impact treatment:Neurological condition and/or seizures   If treatment provided at initial evaluation, no treatment charged due to lack of authorization.     med   Wynetta Emery, OT 01/19/2024, 3:43 PM

## 2024-01-21 ENCOUNTER — Ambulatory Visit: Payer: MEDICAID | Admitting: Physical Therapy

## 2024-01-21 ENCOUNTER — Ambulatory Visit: Payer: MEDICAID | Admitting: Occupational Therapy

## 2024-01-26 ENCOUNTER — Ambulatory Visit: Payer: MEDICAID

## 2024-01-26 ENCOUNTER — Ambulatory Visit: Payer: MEDICAID | Admitting: Occupational Therapy

## 2024-01-28 ENCOUNTER — Ambulatory Visit: Payer: MEDICAID

## 2024-01-28 ENCOUNTER — Ambulatory Visit: Payer: MEDICAID | Attending: Internal Medicine | Admitting: Occupational Therapy

## 2024-02-05 NOTE — Progress Notes (Signed)
   THERAPIST PROGRESS NOTE Virtual Visit via Video Note  I connected with Cheyenne Fowler on 01/09/2024 at 11:00 AM EST by a video enabled telemedicine application and verified that I am speaking with the correct person using two identifiers.  Location: Patient: home Provider: office   I discussed the limitations of evaluation and management by telemedicine and the availability of in person appointments. The patient expressed understanding and agreed to proceed.   Follow Up Instructions: I discussed the assessment and treatment plan with the patient. The patient was provided an opportunity to ask questions and all were answered. The patient agreed with the plan and demonstrated an understanding of the instructions.   The patient was advised to call back or seek an in-person evaluation if the symptoms worsen or if the condition fails to improve as anticipated.   Session Time: 30 minutes  Participation Level: Active  Behavioral Response: CasualAlertAnxious  Type of Therapy: Individual Therapy  Treatment Goals addressed: Lariyah WILL IDENTIFY 3 COGNITIVE PATTERNS AND BELIEFS THAT SUPPORT DEPRESSION   ProgressTowards Goals: Progressing  Interventions: CBT  Summary:  Cheyenne Fowler is a 58 y.o. female who presents for the scheduled appointment oriented times five, appropriately dressed and friendly. Client denied hallucinations and delusions. Client reported she is doing fairly ok. Client reported she is at home recovering from a health issue which she sought medical treatment for. Client reported having some paralysis related to a pinched nerve. Client reported it will take some time and therapy to see how her body responds to improvement. Client reported she and her daughter with her grandkids are settling into a new home. Client reported it is nice and is a nice start from what occurred at the last residence. Client reported she is trying to get everyone to do their part to keep the  house up since she is limited at this time. Client reported otherwise she is managing pretty well. Evidence of progress towards goal:  client reported 1 positive of promptly managing her health concerns and delegating responsibilities to others so she is not overwhelmed.  Suicidal/Homicidal: Nowithout intent/plan  Therapist Response:  Therapist began the appointment asking the client how she has been doing since last seen. Therapist used cbt to engage with active listening and positive emotional support. Therapist used cbt to engage and ask the client to discuss stressors and how she is managing. Therapist used cbt to engage and discuss coping skills for depression. Therapist used CBT ask the client to identify her progress with frequency of use with coping skills with continued practice in her daily activity.    Therapist assigned the client homework to practice self care.   Plan: Return again in 4 weeks.  Diagnosis: bipolar 1 disorder, most recent episode depressed  Collaboration of Care: Patient refused AEB none requested by the client.  Patient/Guardian was advised Release of Information must be obtained prior to any record release in order to collaborate their care with an outside provider. Patient/Guardian was advised if they have not already done so to contact the registration department to sign all necessary forms in order for [place] to release information regarding their care.   Consent: Patient/Guardian gives verbal consent for treatment and assignment of benefits for services provided during this visit. Patient/Guardian expressed understanding and agreed to proceed.   Neena Rhymes Kemoni Ortega, LCSW 01/09/2024

## 2024-02-19 ENCOUNTER — Other Ambulatory Visit: Payer: Self-pay | Admitting: Internal Medicine

## 2024-02-20 ENCOUNTER — Other Ambulatory Visit: Payer: MEDICAID

## 2024-02-20 DIAGNOSIS — Z0000 Encounter for general adult medical examination without abnormal findings: Secondary | ICD-10-CM

## 2024-02-21 LAB — COMPREHENSIVE METABOLIC PANEL
ALT: 29 IU/L (ref 0–32)
AST: 18 IU/L (ref 0–40)
Albumin: 4.1 g/dL (ref 3.8–4.9)
Alkaline Phosphatase: 99 IU/L (ref 44–121)
BUN/Creatinine Ratio: 15 (ref 9–23)
BUN: 12 mg/dL (ref 6–24)
Bilirubin Total: 0.2 mg/dL (ref 0.0–1.2)
CO2: 25 mmol/L (ref 20–29)
Calcium: 9.5 mg/dL (ref 8.7–10.2)
Chloride: 105 mmol/L (ref 96–106)
Creatinine, Ser: 0.79 mg/dL (ref 0.57–1.00)
Globulin, Total: 2.3 g/dL (ref 1.5–4.5)
Glucose: 97 mg/dL (ref 70–99)
Potassium: 4.7 mmol/L (ref 3.5–5.2)
Sodium: 144 mmol/L (ref 134–144)
Total Protein: 6.4 g/dL (ref 6.0–8.5)
eGFR: 87 mL/min/{1.73_m2} (ref >59)

## 2024-02-21 LAB — CBC WITH DIFFERENTIAL/PLATELET
Basophils Absolute: 0 10*3/uL (ref 0.0–0.2)
Basos: 0 %
EOS (ABSOLUTE): 0.3 10*3/uL (ref 0.0–0.4)
Eos: 3 %
Hematocrit: 45.6 % (ref 34.0–46.6)
Hemoglobin: 15 g/dL (ref 11.1–15.9)
Immature Grans (Abs): 0 10*3/uL (ref 0.0–0.1)
Immature Granulocytes: 0 %
Lymphocytes Absolute: 5 10*3/uL — ABNORMAL HIGH (ref 0.7–3.1)
Lymphs: 55 %
MCH: 28.6 pg (ref 26.6–33.0)
MCHC: 32.9 g/dL (ref 31.5–35.7)
MCV: 87 fL (ref 79–97)
Monocytes Absolute: 0.8 10*3/uL (ref 0.1–0.9)
Monocytes: 9 %
Neutrophils Absolute: 2.9 10*3/uL (ref 1.4–7.0)
Neutrophils: 33 %
Platelets: 357 10*3/uL (ref 150–450)
RBC: 5.24 x10E6/uL (ref 3.77–5.28)
RDW: 12.9 % (ref 11.7–15.4)
WBC: 9 10*3/uL (ref 3.4–10.8)

## 2024-02-21 LAB — LIPID PANEL W/O CHOL/HDL RATIO
Cholesterol, Total: 186 mg/dL (ref 100–199)
HDL: 58 mg/dL (ref >39)
LDL Chol Calc (NIH): 110 mg/dL — ABNORMAL HIGH (ref 0–99)
Triglycerides: 101 mg/dL (ref 0–149)
VLDL Cholesterol Cal: 18 mg/dL (ref 5–40)

## 2024-02-21 LAB — HEMOGLOBIN A1C
Est. average glucose Bld gHb Est-mCnc: 126 mg/dL
Hgb A1c MFr Bld: 6 % — ABNORMAL HIGH (ref 4.8–5.6)

## 2024-02-21 LAB — MICROALBUMIN / CREATININE URINE RATIO

## 2024-02-23 ENCOUNTER — Telehealth: Payer: Self-pay

## 2024-02-23 ENCOUNTER — Encounter: Payer: MEDICAID | Admitting: Internal Medicine

## 2024-02-23 NOTE — Telephone Encounter (Signed)
 Patient can not make it to her scheduled CPE appointment on 02/23/2024. Patient would like to be on wait list for a sooner appointment , patient stated she can not wait until july for CPE appointment.   We will call patient if there is a cancellation.

## 2024-03-02 ENCOUNTER — Ambulatory Visit: Payer: MEDICAID | Admitting: Psychology

## 2024-03-02 DIAGNOSIS — Z09 Encounter for follow-up examination after completed treatment for conditions other than malignant neoplasm: Secondary | ICD-10-CM

## 2024-03-08 ENCOUNTER — Encounter: Payer: Self-pay | Admitting: Internal Medicine

## 2024-03-08 MED ORDER — ATORVASTATIN CALCIUM 80 MG PO TABS: 80.0000 mg | ORAL_TABLET | Freq: Every day | ORAL | 11 refills | Status: DC

## 2024-03-08 NOTE — Progress Notes (Signed)
 Case Management w/Sarah Margorie John  I met with Cheyenne Fowler for 1.5 hrs. She came in per Dr. Delrae Alfred to discuss substance use but her main concern today is financial instability due to a recent move. She and her family owe back rent and have difficulties purchasing enough healthy food. She also is grieving the recent (3wks ago) death of her brother, who died in front of her as the ambulance arrived.  I provided space for Cheyenne Fowler to express her grief and feelings of overwhelm, which she tries to avoid sharing with her family, which includes her husband, daughter, and granddaughters.   She expressed that she is not currently using cocaine. She denies alcohol consumption. Her brother's ill health had spurred her to try to take better care of herself.  Together, we looked at her financial situation and figured out how to log back into [place] social services, so she could apply for LIEP. She took the LIEP information and said she would have her case manager help her fill it out. If LIEP works out for her, it could help her decrease her electricity bill to a manageable level. She said she would let me know how that goes.  Cheyenne Fowler and her family together would benefit from financial counseling. Cheyenne Fowler would benefit from mental health counseling.

## 2024-03-08 NOTE — Addendum Note (Signed)
 Addended by: Marcene Duos on: 03/08/2024 01:03 PM   Modules accepted: Orders

## 2024-03-09 ENCOUNTER — Other Ambulatory Visit: Payer: MEDICAID | Admitting: Psychology

## 2024-03-11 ENCOUNTER — Encounter (HOSPITAL_COMMUNITY): Payer: MEDICAID | Admitting: Psychiatry

## 2024-03-15 NOTE — Telephone Encounter (Signed)
Called patient, patient did not answer.

## 2024-03-18 ENCOUNTER — Encounter (HOSPITAL_COMMUNITY): Payer: Self-pay | Admitting: Psychiatry

## 2024-03-18 ENCOUNTER — Telehealth (INDEPENDENT_AMBULATORY_CARE_PROVIDER_SITE_OTHER): Payer: MEDICAID | Admitting: Psychiatry

## 2024-03-18 DIAGNOSIS — F411 Generalized anxiety disorder: Secondary | ICD-10-CM | POA: Diagnosis not present

## 2024-03-18 DIAGNOSIS — F1090 Alcohol use, unspecified, uncomplicated: Secondary | ICD-10-CM

## 2024-03-18 DIAGNOSIS — F3130 Bipolar disorder, current episode depressed, mild or moderate severity, unspecified: Secondary | ICD-10-CM

## 2024-03-18 DIAGNOSIS — F5105 Insomnia due to other mental disorder: Secondary | ICD-10-CM

## 2024-03-18 DIAGNOSIS — Z789 Other specified health status: Secondary | ICD-10-CM

## 2024-03-18 MED ORDER — HYDROXYZINE PAMOATE 50 MG PO CAPS: 50.0000 mg | ORAL_CAPSULE | Freq: Four times a day (QID) | ORAL | 3 refills | Status: DC | PRN

## 2024-03-18 MED ORDER — QUETIAPINE FUMARATE 100 MG PO TABS: 100.0000 mg | ORAL_TABLET | Freq: Every day | ORAL | 3 refills | Status: DC

## 2024-03-18 MED ORDER — LAMOTRIGINE 200 MG PO TABS: 200.0000 mg | ORAL_TABLET | Freq: Every day | ORAL | 3 refills | Status: DC

## 2024-03-18 MED ORDER — DULOXETINE HCL 20 MG PO CPEP: 20.0000 mg | ORAL_CAPSULE | Freq: Every day | ORAL | 3 refills | Status: DC

## 2024-03-18 MED ORDER — MELATONIN 5 MG PO CHEW: 5.0000 mg | CHEWABLE_TABLET | Freq: Every evening | ORAL | 3 refills | Status: DC | PRN

## 2024-03-18 NOTE — Progress Notes (Signed)
 BH MD/PA/NP OP Progress Note Virtual Visit via Telephone Note  I connected with Cheyenne Fowler on 03/18/24 at  3:00 PM EDT by telephone and verified that I am speaking with the correct person using two identifiers.  Location: Patient: home Provider: Clinic   I discussed the limitations, risks, security and privacy concerns of performing an evaluation and management service by telephone and the availability of in person appointments. I also discussed with the patient that there may be a patient responsible charge related to this service. The patient expressed understanding and agreed to proceed.   I provided 20 minutes of non-face-to-face time during this encounter.         03/18/2024 4:32 PM HENA Fowler  MRN:  [id number]  Chief Complaint:  "I am still here and I thank God"   HPI: 58 year-old female seen today for follow-up psychiatric evaluation. She has a psychiatric history of polysubstance use (cocaine, marijuana, and alcohol), depression, and bipolar disorder. She was being managed on Lamictal 200 mg daily, Seroquel 100 mg nightly, and Vistaril 50mg  4 times daily as needed. She reports that her medications are somewhat effective in managing her psychiatric conditions.  Today she is tearful, well groomed, pleasant, and engaged in conversation. Patient informed Clinical research associate that she is  still here and she thanks God. She however reports that she has been struggling. Since moving in with her daughter, her boyfriend, and her brother she notes that her daughter went to court for old cases and new traffic violations. Patient notes that she has recently been having conflicts with her daughter who has threatened to put her out the home. Financially she reports that things are stressful as her husband has not gotten use to paying bills. She notes that the bills are behind (500 for her light bill and is behind on her march rent).  She is also concerned about her husband who must have surgery  today.  Patient also notes that her brother passed away on 05-Mar-2025in front of her due to respiratory issues. She notes that the EMT was unable to resuscitate her brother and notes that his body was left in the home until the morticians were able to get him.  Patient also notes that she is stressed because she has take her daughter to work at 6 AM, dropped her grandson off to school, and deal with household issues.  She reports that the above exacerbates her anxiety and depression.  Patient notes that 3 days after her brother passed away she relapsed on cocaine.  She also notes that she has been drinking 1 or 2 40 ounce beers daily.  She also reports that she has been smoking marijuana.  Provider informed patient that the substances can exacerbate her mental health.  She endorsed understanding and notes that she has been trying to cut back on alcohol, has not used cocaine since February 27th, and is limiting her marijuana consumption.  Today provider conducted a GAD-7 and patient scored a 12.  Provider also conducted PHQ-9 of he scored 18.  She notes that her sleep and appetite fluctuates.  Patient reports that at times she feels like she sees her deceased brother sitting on the couch.  She notes that she is unaware if this is a hallucination or just a traumatic response.  Today she denies SI/HI/AH, mania, paranoia.  She reports that she sleeps approximately 4 to 5 hours nightly.  Patient informed Clinical research associate that she has been having chronic pain in her  neck, back, and shoulder.  She reports the gabapentin no longer helps relieve her pain.  Today she quantifies her pain as 10 out of 10.  She reports that she does not have an upcoming appoint with pain management anytime soon.  Due to all the above stressors patient reports that she has been missing doctors appointments.  Provider recommended increasing Seroquel to help manage her sleep, anxiety, depression and patient however notes that she fears it will make her  too sedated.  She was agreeable to starting Cymbalta 20 mg to help manage anxiety, depression, and pain.  She will also start melatonin 5 to 10 mg nightly as needed to help manage sleep.  Potential side effects of medication and risks vs benefits of treatment vs non-treatment were explained and discussed.   No other concerns at this time. Visit Diagnosis:    ICD-10-CM   1. Alcohol use  Z78.9     2. Bipolar I disorder, most recent episode depressed (HCC)  F31.30 hydrOXYzine (VISTARIL) 50 MG capsule    lamoTRIgine (LAMICTAL) 200 MG tablet    QUEtiapine (SEROQUEL) 100 MG tablet    DULoxetine (CYMBALTA) 20 MG capsule    3. Insomnia due to mental disorder  F51.05 Melatonin 5 MG CHEW    4. Generalized anxiety disorder  F41.1 DULoxetine (CYMBALTA) 20 MG capsule          Past Psychiatric History: Depression, substance induced mood disorder, Bipolar disorder,  Past Medical History:  Past Medical History:  Diagnosis Date   Anemia    Bipolar disorder (HCC)    Diabetes mellitus (HCC) 2022   type 2   Environmental and seasonal allergies 1968   GERD (gastroesophageal reflux disease)    Heart murmur    Hypertension    Upper GI bleed 01/15/2022    Past Surgical History:  Procedure Laterality Date   BIOPSY  01/17/2022   Procedure: BIOPSY;  Surgeon: Tressia Danas, MD;  Location: [facility] ENDOSCOPY;  Service: Gastroenterology;;   CESAREAN SECTION  1982, 1983, 1990   ESOPHAGOGASTRODUODENOSCOPY (EGD) WITH PROPOFOL N/A 01/17/2022   Procedure: ESOPHAGOGASTRODUODENOSCOPY (EGD) WITH PROPOFOL;  Surgeon: Tressia Danas, MD;  Location: [facility] ENDOSCOPY;  Service: Gastroenterology;  Laterality: N/A;   HERNIA REPAIR     Umbilical hernia in the 2nd grade   LEFT HEART CATH AND CORONARY ANGIOGRAPHY N/A 05/06/2023   Procedure: LEFT HEART CATH AND CORONARY ANGIOGRAPHY;  Surgeon: Tonny Bollman, MD;  Location: [facility] INVASIVE CV LAB;  Service: Cardiovascular;  Laterality: N/A;   TUBAL LIGATION  1990    Family  Psychiatric History: Brother schizophrenia and bipolar, sister bipolar disorder  Family History:  Family History  Problem Relation Age of Onset   Diabetes Mother    Cancer Mother        liver--not clear if this was the primary   COPD Mother    Bipolar disorder Sister    Diabetes Sister    Schizophrenia Brother    Bipolar disorder Brother    Cancer Brother        Not sure of primary   Cancer Maternal Aunt        Breast cancer   Breast cancer Maternal Aunt        Cause of death   Cancer Maternal Aunt        pancreatic   Cancer Maternal Grandfather        throat   Colon cancer Neg Hx    Colon polyps Neg Hx    Esophageal cancer Neg Hx  Stomach cancer Neg Hx    Rectal cancer Neg Hx     Social History:  Social History   Socioeconomic History   Marital status: Married    Spouse name: Evern Bio   Number of children: 3   Years of education: Not on file   Highest education level: GED or equivalent  Occupational History   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  Was drinking every day prior.  Was drinking due to stress.She and husband drink every evening.  Her counselor, Page, at [employer] is aware of her drinking.   Drug use: Not Currently    Types: Marijuana, "Crack" cocaine    Comment: Last cocaine use was 30 days ago.  No MJ for 2 months.   Sexual activity: Yes    Birth control/protection: Surgical  Other Topics Concern   Not on file  Social History Narrative   She and her husband now live in a home with her daughter and daughter's 2 children as well as patient's brother, who is just out of  a supervised living situation for those living with Mental illness.  He has schizophrenia.  Previously lived with daughter and daughter's partner--lot of stress as they split up and finding this new home in November.        Born and raised in [place]    Social Drivers of Health   Financial Resource Strain: Low Risk  (01/23/2022)   Overall Financial Resource Strain (CARDIA)    Difficulty of Paying Living Expenses: Not very hard  Food Insecurity: No Food Insecurity (11/29/2023)   Hunger Vital Sign    Worried About Running Out of Food in the Last Year: Never true    Ran Out of Food in the Last Year: Never true  Transportation Needs: No Transportation Needs (11/29/2023)   PRAPARE - Administrator, Civil Service (Medical): No    Lack of Transportation (Non-Medical): No  Physical Activity: Not on file  Stress: Not on file  Social Connections: Not on file    Allergies:  Allergies  Allergen Reactions   Penicillins Hives   Sulfonamide Derivatives Hives    Metabolic Disorder Labs: Lab Results  Component Value Date   HGBA1C 6.0 (H) 02/20/2024   MPG 128.37 11/29/2023   MPG 119.76 07/03/2018   No results found for: "PROLACTIN" Lab Results  Component Value Date   CHOL 186 02/20/2024   TRIG 101 02/20/2024   HDL 58 02/20/2024   CHOLHDL 4.3 07/03/2018   VLDL 31 07/03/2018   LDLCALC 110 (H) 02/20/2024   LDLCALC 73 05/20/2023   Lab Results  Component Value Date   TSH 0.828 05/23/2012    Therapeutic Level Labs: No results found for: "LITHIUM" No results found for: "VALPROATE" No results found for: "CBMZ"  Current Medications: Current Outpatient Medications  Medication Sig Dispense Refill   DULoxetine (CYMBALTA) 20 MG capsule Take 1 capsule (20 mg total) by mouth daily. 30 capsule 3   Melatonin 5 MG CHEW Chew 5-10 mg by mouth at bedtime as needed. 60 tablet 3   atorvastatin (LIPITOR) 80  MG tablet Take 1 tablet (80 mg total) by mouth daily. 30 tablet 11   Blood  Pressure Monitoring (CLEVER CHOICE BP MONITOR/ARM) DEVI Use to check blood pressure 3 times weekly and as needed 1 each 0   carvedilol (COREG) 3.125 MG tablet Take 1 tablet (3.125 mg total) by mouth 2 (two) times daily with a meal. (Patient taking differently: Take 3.125 mg by mouth daily.) 60 tablet 11   cetirizine (ZYRTEC) 10 MG tablet Take 1 tablet (10 mg total) by mouth daily. 30 tablet 11   ciprofloxacin (CIPRO) 500 MG tablet 1 tab by mouth twice daily for 7 days. 14 tablet 0   Glucose Blood (BLOOD GLUCOSE TEST STRIPS) STRP Check blood glucose twice daily before meals 100 strip 11   hydrOXYzine (VISTARIL) 50 MG capsule Take 1 capsule (50 mg total) by mouth every 6 (six) hours as needed. 120 capsule 3   isosorbide mononitrate (IMDUR) 30 MG 24 hr tablet Take 1 tablet (30 mg total) by mouth daily. (Patient not taking: Reported on 12/11/2023) 90 tablet 3   lamoTRIgine (LAMICTAL) 200 MG tablet Take 1 tablet (200 mg total) by mouth daily. 30 tablet 3   Lancets Misc. MISC Check blood glucose twice daily before meals 100 each 11   losartan (COZAAR) 50 MG tablet Take 1 tablet (50 mg total) by mouth daily. 30 tablet 11   metFORMIN (GLUCOPHAGE-XR) 500 MG 24 hr tablet TAKE 1 TABLET BY MOUTH TWICE DAILY WITH MEALS 60 tablet 4   nitroGLYCERIN (NITROSTAT) 0.4 MG SL tablet Place 1 tablet (0.4 mg total) under the tongue every 5 (five) minutes as needed for chest pain. 25 tablet 1   pantoprazole (PROTONIX) 40 MG tablet Take 1 tablet (40 mg total) by mouth 2 (two) times daily. 180 tablet 0   QUEtiapine (SEROQUEL) 100 MG tablet Take 1 tablet (100 mg total) by mouth at bedtime. 30 tablet 3   No current facility-administered medications for this visit.     Musculoskeletal: Strength & Muscle Tone: within normal limits and telehealth visit Gait & Station: normal, telehealth visit Patient leans: N/A  Psychiatric Specialty Exam: Review of Systems  Last menstrual period 06/09/2018.There is no height or weight on  file to calculate BMI.  General Appearance: Well Groomed  Eye Contact:  Good  Speech:  Clear and Coherent and Normal Rate  Volume:  Normal  Mood:  Euthymic  Affect:  Congruent  Thought Process:  Coherent, Goal Directed, and Linear  Orientation:  Full (Time, Place, and Person)  Thought Content: WDL and Logical   Suicidal Thoughts:  No  Homicidal Thoughts:  No  Memory:  Immediate;   Good Recent;   Good Remote;   Good  Judgement:  Good  Insight:  Good  Psychomotor Activity:  Normal  Concentration:  Concentration: Good and Attention Span: Good  Recall:  Good  Fund of Knowledge: Good  Language: Good  Akathisia:  No  Handed:  Right  AIMS (if indicated): not done  Assets:  Communication Skills Desire for Improvement Housing Leisure Time Social Support  ADL's:  Intact  Cognition: WNL  Sleep:  Good   Screenings: CAGE-AID    Flowsheet Row ED to Hosp-Admission (Discharged) from 11/28/2023 in [facility]  CAGE-AID Score 0      GAD-7    Flowsheet Row Video Visit from 03/18/2024 in [facility] Clinical Support from 10/07/2023 in [facility] Clinical Support from 07/08/2023 in [facility]  Center Video Visit from 01/15/2023 in [facility] Video Visit from 02/06/2022 in [facility]  Total GAD-7 Score 12 17 20 20 16       PHQ2-9    Flowsheet Row Video Visit from 03/18/2024 in [facility] Clinical Support from 10/07/2023 in [facility] Clinical Support from 07/08/2023 in [facility] Video Visit from 01/15/2023 in [facility] Video Visit from 02/06/2022 in [facility]  PHQ-2 Total Score 5 3 2 5 5   PHQ-9 Total Score 18 12 7 24 7       Flowsheet Row ED to Hosp-Admission (Discharged) from  11/28/2023 in [facility] ED from 11/27/2023 in [facility] Admission (Discharged) from 05/06/2023 in [facility] CARDIAC CATH LAB  C-SSRS RISK CATEGORY No Risk No Risk No Risk        Assessment and Plan: Patient reports that her anxiety, depression, and sleep has worsened since her last visit.  She also has relapsed on cocaine, marijuana, alcohol.Provider recommended increasing Seroquel to help manage her sleep, anxiety, depression and patient however notes that she fears it will make her too sedated.  She was agreeable to starting Cymbalta 20 mg to help manage anxiety, depression, and pain.  She will also start melatonin 5 to 10 mg nightly as needed to help manage sleep.  Potential side effects of medication and risks vs benefits of treatment vs non-treatment were explained and discussed.  1. Bipolar I disorder, most recent episode depressed (HCC)  Continue- hydrOXYzine (VISTARIL) 50 MG capsule; Take 1 capsule (50 mg total) by mouth every 6 (six) hours as needed.  Dispense: 120 capsule; Refill: 3 Continue- lamoTRIgine (LAMICTAL) 200 MG tablet; Take 1 tablet (200 mg total) by mouth daily.  Dispense: 30 tablet; Refill: 3 Continue- QUEtiapine (SEROQUEL) 100 MG tablet; Take 1 tablet (100 mg total) by mouth at bedtime.  Dispense: 30 tablet; Refill: 3 Start- DULoxetine (CYMBALTA) 20 MG capsule; Take 1 capsule (20 mg total) by mouth daily.  Dispense: 30 capsule; Refill: 3  2. Alcohol use (Primary)   3. Insomnia due to mental disorder  Start- Melatonin 5 MG CHEW; Chew 5-10 mg by mouth at bedtime as needed.  Dispense: 60 tablet; Refill: 3  4. Generalized anxiety disorder  Start- DULoxetine (CYMBALTA) 20 MG capsule; Take 1 capsule (20 mg total) by mouth daily.  Dispense: 30 capsule; Refill: 3   Follow-up in 2 months Shanna Cisco, NP 03/18/2024, 4:32 PM

## 2024-03-19 ENCOUNTER — Ambulatory Visit (HOSPITAL_COMMUNITY): Payer: MEDICAID | Admitting: Clinical

## 2024-03-19 DIAGNOSIS — F3130 Bipolar disorder, current episode depressed, mild or moderate severity, unspecified: Secondary | ICD-10-CM

## 2024-03-19 NOTE — Progress Notes (Signed)
 THERAPIST PROGRESS NOTE Virtual Visit via Video Note  I connected with Cheyenne Fowler on 03/19/24 at 11:00 AM EDT by a video enabled telemedicine application and verified that I am speaking with the correct person using two identifiers.  Location: Patient: home Provider: office   I discussed the limitations of evaluation and management by telemedicine and the availability of in person appointments. The patient expressed understanding and agreed to proceed.   Follow Up Instructions: I discussed the assessment and treatment plan with the patient. The patient was provided an opportunity to ask questions and all were answered. The patient agreed with the plan and demonstrated an understanding of the instructions.   The patient was advised to call back or seek an in-person evaluation if the symptoms worsen or if the condition fails to improve as anticipated.   Session Time: 40 minutes  Participation Level: Active  Behavioral Response: CasualAlertDepressed  Type of Therapy: Individual Therapy  Treatment Goals addressed: Cheyenne Fowler WILL COMPLETE AT LEAST 80% OF ASSIGNED HOMEWORK   ProgressTowards Goals: Progressing  Interventions: CBT  Summary:  Cheyenne Fowler is a 58 y.o. female who presents for the scheduled appointment oriented x 5, appropriately dressed, and friendly.  Client denied hallucinations or delusions. Client reported on today she has been under a lot of stress and depressed.  Client reported in February of this year her brother passed away in front of her.  Client reported the image of seeing him turned purple and fall out has been hard for her to not think about.  Client reported she was very close with her brother.  Client reported she is happy that he was at home with him and he was able to eat his favorite foods as he had 1 and he before his untimely passing.  Client reported she did use unhealthy coping mechanisms for a few days such as illicit substances that she has  refrained from doing so now.  Client reported there is also been financial strain amongst her daughter and husband.  Client reported it has been hard for them to get the money together to catch up on their bills.  Client reported she had argument with her daughter threatened to kick out of her and her husband.  Client reported because her husband is in the house he has not been contributing to any of their finances.  Client reported overall she has been frustrated with him because of how he treats her as it is ambulating.  Client reported with everything going on and helping to attend to her grandkids and getting them back and forth as well as her daughter to work she has not been able to keep her own medical appointments. Evidence of progress towards goal: Client reported 1 stressor of grief that has contributed to her depressive symptoms but she is having a difficult time coping with.  Suicidal/Homicidal: Nowithout intent/plan  Therapist Response:  Therapist began the appointment asking the client how she has been doing since last seen. Therapist engaged using active listening and positive emotional support. Therapist used CBT to get the client time to discuss her thoughts about her family, grief, and her health. Therapist used CBT to continue teaching the client about her priorities and decision making as well as positive coping skills. Therapist used CBT ask the client to identify her progress with frequency of use with coping skills with continued practice in her daily activity.    Therapist assigned the client homework to practice self-care.   Plan: Return  again in 4 weeks.  Diagnosis: bipolar 1 most recent episode, depressed  Collaboration of Care: Patient refused AEB none requested by the client.  Patient/Guardian was advised Release of Information must be obtained prior to any record release in order to collaborate their care with an outside provider. Patient/Guardian was advised if they  have not already done so to contact the registration department to sign all necessary forms in order for [place] to release information regarding their care.   Consent: Patient/Guardian gives verbal consent for treatment and assignment of benefits for services provided during this visit. Patient/Guardian expressed understanding and agreed to proceed.   Cheyenne Rhymes Wandra Babin, LCSW 03/19/2024

## 2024-03-27 ENCOUNTER — Other Ambulatory Visit: Payer: Self-pay | Admitting: Internal Medicine

## 2024-04-15 NOTE — Telephone Encounter (Signed)
Called patient to offer appointment, patient did not answer.

## 2024-04-26 ENCOUNTER — Other Ambulatory Visit: Payer: MEDICAID | Admitting: Internal Medicine

## 2024-05-01 ENCOUNTER — Other Ambulatory Visit: Payer: Self-pay | Admitting: Cardiovascular Disease

## 2024-05-01 ENCOUNTER — Other Ambulatory Visit: Payer: Self-pay | Admitting: Internal Medicine

## 2024-05-01 DIAGNOSIS — K219 Gastro-esophageal reflux disease without esophagitis: Secondary | ICD-10-CM

## 2024-05-27 ENCOUNTER — Telehealth (HOSPITAL_COMMUNITY): Payer: MEDICAID | Admitting: Psychiatry

## 2024-05-27 ENCOUNTER — Encounter (HOSPITAL_COMMUNITY): Payer: Self-pay

## 2024-06-16 ENCOUNTER — Other Ambulatory Visit: Payer: Self-pay | Admitting: Internal Medicine

## 2024-06-16 ENCOUNTER — Telehealth: Payer: Self-pay | Admitting: Neurology

## 2024-06-16 DIAGNOSIS — Z1231 Encounter for screening mammogram for malignant neoplasm of breast: Secondary | ICD-10-CM

## 2024-06-16 NOTE — Telephone Encounter (Signed)
 Pt called to request to be seen sooner the patient explained that at first she was doing really well now that pains has come back and is very concern . Pt states hand keep swelling  and sometimes its hard to get off couch and stand up . Pt not sure what's going on she  just don't want have a stroke the patient has been taking regular medication that was prescribe by Doctor .

## 2024-06-16 NOTE — Telephone Encounter (Signed)
 Call to patient, says call can't be completed. Tried 2x. Call to husband no answer. Left message to return call

## 2024-06-22 ENCOUNTER — Ambulatory Visit: Payer: MEDICAID

## 2024-06-23 ENCOUNTER — Telehealth: Payer: Self-pay | Admitting: Internal Medicine

## 2024-06-23 NOTE — Telephone Encounter (Signed)
 Patient left a voicemail asking for office to return call , patient states she would like to schedule an appointment.   --Called patient back , patient did not answer 06/23/24

## 2024-06-23 NOTE — Telephone Encounter (Signed)
 Called patient again, patient answered.  Patient states she just needed an appointment to see doctor [facility] but does not have concerns at the moment, Notify patient she has an upcoming appointment for 08/19/24 , patient states she will wait for that appointment.

## 2024-06-28 ENCOUNTER — Other Ambulatory Visit: Payer: Self-pay | Admitting: Neurology

## 2024-06-28 ENCOUNTER — Telehealth: Payer: Self-pay

## 2024-06-28 DIAGNOSIS — G373 Acute transverse myelitis in demyelinating disease of central nervous system: Secondary | ICD-10-CM

## 2024-06-28 DIAGNOSIS — R200 Anesthesia of skin: Secondary | ICD-10-CM

## 2024-06-28 NOTE — Telephone Encounter (Signed)
 Pt called back from a callback made on 06/16/2024. Pt states that she has completed PT, but she is having more weakness and numbness in her right arm that goes from her shoulder down. Pt states she has been taking her Gabapentin  and Tylenol  to help with her discomfort. Asked pt is she has tried to seek medical attention for her symptoms, pt then asked should I have done that? Told pt that it has been going on for a while since your last appointment with us ,the best option was to get medical attention. Pt states that she didn't know she could do that, and that she was just dealing with it.   Sending to MD to Advise on what next steps should be taking.

## 2024-06-30 NOTE — Telephone Encounter (Signed)
 Took call from phone room and spoke w/ pt. Relayed Dr. Duncan message. She verbalized understanding. Aware insurance auth pending currently. Once approved, someone will call to get her scheduled. Scheduled f/u for 07/27/24 at 9am w/ Dr. Vear as well.

## 2024-07-05 NOTE — Telephone Encounter (Signed)
 Pt called for the status of her MRI approval, coordinator stated it is still pending on pt's insurance company end,pt will contact them

## 2024-07-08 NOTE — Telephone Encounter (Signed)
 MRI brain & cervical: [employer] auth: [id number] exp. 06/29/24-08/28/24 sent to GI [phone]

## 2024-07-22 ENCOUNTER — Telehealth: Payer: Self-pay | Admitting: Neurology

## 2024-07-22 NOTE — Telephone Encounter (Signed)
 MYC conf

## 2024-07-27 ENCOUNTER — Ambulatory Visit: Payer: MEDICAID | Admitting: Neurology

## 2024-07-27 ENCOUNTER — Encounter: Payer: Self-pay | Admitting: Neurology

## 2024-07-27 VITALS — BP 142/88 | HR 85 | Ht 62.0 in | Wt 202.5 lb

## 2024-07-27 DIAGNOSIS — Z7984 Long term (current) use of oral hypoglycemic drugs: Secondary | ICD-10-CM

## 2024-07-27 DIAGNOSIS — I10 Essential (primary) hypertension: Secondary | ICD-10-CM

## 2024-07-27 DIAGNOSIS — G379 Demyelinating disease of central nervous system, unspecified: Secondary | ICD-10-CM

## 2024-07-27 DIAGNOSIS — G373 Acute transverse myelitis in demyelinating disease of central nervous system: Secondary | ICD-10-CM | POA: Diagnosis not present

## 2024-07-27 DIAGNOSIS — R29898 Other symptoms and signs involving the musculoskeletal system: Secondary | ICD-10-CM

## 2024-07-27 DIAGNOSIS — R200 Anesthesia of skin: Secondary | ICD-10-CM

## 2024-07-27 DIAGNOSIS — E119 Type 2 diabetes mellitus without complications: Secondary | ICD-10-CM

## 2024-07-27 MED ORDER — GABAPENTIN 300 MG PO CAPS: 300.0000 mg | ORAL_CAPSULE | Freq: Three times a day (TID) | ORAL | 11 refills | Status: DC

## 2024-07-27 NOTE — Progress Notes (Signed)
 [facility]  PATIENT: Cheyenne Fowler DOB: 10-Nov-1966  REFERRING DOCTOR OR PCP:  Aisha Seals, MD; Almarie Bolds, MD SOURCE: Patient, notes from recent hospitalization, imaging and lab reports, MRI images personally reviewed.  _________________________________   HISTORICAL  CHIEF COMPLAINT:  Chief Complaint  Patient presents with   Follow-up    Rm 11, alone. Last seen 12/10/23.     HISTORY OF PRESENT ILLNESS:  Cheyenne Fowler is a 58 y.o. woman witharm weakness and abnormal cervical spine MRI.  UPDATE 07/27/2024 Since the last visit, she denies any exacerbation or new symptoms but still has right arm numbness and pain.  The pain is like a muscle cramp.   She notes mild weakness in the right arm.   Numbness is predominantly in the right arm though sometimes she notes mild symptoms in her legs.     We had ordered a f/u MRI of the brain and cervical spine but she did not get this done.     She has NIDDM, PTSD/anxiety, HTN, hyperlipidemia, CAD.     H/O transverse myelitis Cheyenne Fowler had the onset of right arm weakness and some right arm pain when she woke up November 26, 2023.  She also had right arm and foot numbness.  Symptoms persisted so she went to the ED.   She was positive for cocaine in her urine tox screen but states she had not used it within a week of the event.   She was admitted and given 5 days of IV Solu-Medrol .  MRI of the brain was essentially normal for age just showing a few T2/FLAIR hyperintense foci consistent with mild chronic microvascular ischemic change.  MRI of the cervical spine showed a longitudinally extensive T2 hyperintense focus from C2 C3-C6.  Of note, it did not appear to enhance after contrast.  She also has mild spinal stenosis at C3-C4 and C6-C7 and there is potential for right C4 above C7 nerve root compression at these levels.  MRI of the thoracic spine was normal.  While in the hospital, she did have some improvement in  the strength of the right arm but not to baseline.  She was evaluated for multiple causes of transverse myelitis.  Anti-MOG and anti-NMO were negative.  Additionally, because she had used cocaine, ischemic etiology was also considered.    Imaging: MRI of the head 11/28/2023 was normal for age showing a couple T2/FLAIR hyperintense foci in the subcortical or deep white matter consistent with mild chronic microvascular ischemic change.  MRI of the cervical spine 11/28/2023 showed a T2 hyperintense focus from C2-C3 to C6.  It was central but more to the right adjacent to C3.  Central gray may have been more involved adjacent to C5.  The focus did not enhance.  Mild spinal stenosis moderately severe left foraminal narrowing is noted at C6-C7.  Foraminal narrowing noted at C3-C4.  Potential for right C4 and left C7 nerve root compression.  MRI of the thoracic spine 11/30/2023 was normal.  MRI of the brachial plexus 11/28/2023 (report) was normal  REVIEW OF SYSTEMS: Constitutional: No fevers, chills, sweats, or change in appetite Eyes: No visual changes, double vision, eye pain Ear, nose and throat: No hearing loss, ear pain, nasal congestion, sore throat Cardiovascular: No chest pain, palpitations Respiratory:  No shortness of breath at rest or with exertion.   No wheezes GastrointestinaI: No nausea, vomiting, diarrhea, abdominal pain, fecal incontinence Genitourinary:  No dysuria, urinary retention or frequency.  No nocturia. Musculoskeletal:  No neck pain, back pain Integumentary: No rash, pruritus, skin lesions Neurological: as above Psychiatric: No depression at this time.  No anxiety Endocrine: No palpitations, diaphoresis, change in appetite, change in weigh or increased thirst Hematologic/Lymphatic:  No anemia, purpura, petechiae. Allergic/Immunologic: No itchy/runny eyes, nasal congestion, recent allergic reactions, rashes  ALLERGIES: Allergies  Allergen Reactions   Penicillins Hives    Sulfonamide Derivatives Hives    HOME MEDICATIONS:  Current Outpatient Medications:    atorvastatin  (LIPITOR ) 80 MG tablet, Take 1 tablet (80 mg total) by mouth daily., Disp: 30 tablet, Rfl: 11   Blood Pressure Monitoring (CLEVER CHOICE BP MONITOR/ARM) DEVI, Use to check blood pressure 3 times weekly and as needed, Disp: 1 each, Rfl: 0   carvedilol  (COREG ) 3.125 MG tablet, TAKE 1 TABLET BY MOUTH TWICE DAILY WITH MEALS, Disp: 60 tablet, Rfl: 11   cetirizine  (ZYRTEC ) 10 MG tablet, Take 1 tablet by mouth once daily, Disp: 30 tablet, Rfl: 11   DULoxetine  (CYMBALTA ) 20 MG capsule, Take 1 capsule (20 mg total) by mouth daily., Disp: 30 capsule, Rfl: 3   gabapentin  (NEURONTIN ) 300 MG capsule, Take 1 capsule (300 mg total) by mouth 3 (three) times daily., Disp: 90 capsule, Rfl: 11   hydrOXYzine  (VISTARIL ) 50 MG capsule, Take 1 capsule (50 mg total) by mouth every 6 (six) hours as needed., Disp: 120 capsule, Rfl: 3   isosorbide  mononitrate (IMDUR ) 30 MG 24 hr tablet, Take 1 tablet by mouth once daily, Disp: 90 tablet, Rfl: 0   lamoTRIgine  (LAMICTAL ) 200 MG tablet, Take 1 tablet (200 mg total) by mouth daily., Disp: 30 tablet, Rfl: 3   losartan  (COZAAR ) 50 MG tablet, Take 1 tablet by mouth once daily, Disp: 30 tablet, Rfl: 11   Melatonin 5 MG CHEW, Chew 5-10 mg by mouth at bedtime as needed., Disp: 60 tablet, Rfl: 3   nitroGLYCERIN  (NITROSTAT ) 0.4 MG SL tablet, Place 1 tablet (0.4 mg total) under the tongue every 5 (five) minutes as needed for chest pain., Disp: 25 tablet, Rfl: 1   pantoprazole  (PROTONIX ) 40 MG tablet, Take 1 tablet by mouth twice daily, Disp: 180 tablet, Rfl: 3   QUEtiapine  (SEROQUEL ) 100 MG tablet, Take 1 tablet (100 mg total) by mouth at bedtime., Disp: 30 tablet, Rfl: 3   ciprofloxacin  (CIPRO ) 500 MG tablet, 1 tab by mouth twice daily for 7 days., Disp: 14 tablet, Rfl: 0   Glucose Blood (BLOOD GLUCOSE TEST STRIPS) STRP, Check blood glucose twice daily before meals (Patient not  taking: Reported on 07/27/2024), Disp: 100 strip, Rfl: 11   Lancets Misc. MISC, Check blood glucose twice daily before meals (Patient not taking: Reported on 07/27/2024), Disp: 100 each, Rfl: 11   metFORMIN  (GLUCOPHAGE -XR) 500 MG 24 hr tablet, TAKE 1 TABLET BY MOUTH TWICE DAILY WITH MEALS, Disp: 60 tablet, Rfl: 4  PAST MEDICAL HISTORY: Past Medical History:  Diagnosis Date   Anemia    Bipolar disorder (HCC)    Diabetes mellitus (HCC) 2022   type 2   Environmental and seasonal allergies 1968   GERD (gastroesophageal reflux disease)    Heart murmur    Hypertension    Upper GI bleed 01/15/2022    PAST SURGICAL HISTORY: Past Surgical History:  Procedure Laterality Date   BIOPSY  01/17/2022   Procedure: BIOPSY;  Surgeon: Eda Iha, MD;  Location: [facility] ENDOSCOPY;  Service: Gastroenterology;;   CESAREAN SECTION  1982, 1983, 1990   ESOPHAGOGASTRODUODENOSCOPY (EGD) WITH PROPOFOL  N/A 01/17/2022   Procedure: ESOPHAGOGASTRODUODENOSCOPY (EGD) WITH  PROPOFOL ;  Surgeon: Eda Iha, MD;  Location: [facility] ENDOSCOPY;  Service: Gastroenterology;  Laterality: N/A;   HERNIA REPAIR     Umbilical hernia in the 2nd grade   LEFT HEART CATH AND CORONARY ANGIOGRAPHY N/A 05/06/2023   Procedure: LEFT HEART CATH AND CORONARY ANGIOGRAPHY;  Surgeon: Wonda Sharper, MD;  Location: [facility] INVASIVE CV LAB;  Service: Cardiovascular;  Laterality: N/A;   TUBAL LIGATION  1990    FAMILY HISTORY: Family History  Problem Relation Age of Onset   Diabetes Mother    Cancer Mother        liver--not clear if this was the primary   COPD Mother    Bipolar disorder Sister    Diabetes Sister    Schizophrenia Brother    Bipolar disorder Brother    Cancer Brother        Not sure of primary   Cancer Maternal Aunt        Breast cancer   Breast cancer Maternal Aunt        Cause of death   Cancer Maternal Aunt        pancreatic   Cancer Maternal Grandfather        throat   Colon cancer Neg Hx    Colon polyps Neg Hx     Esophageal cancer Neg Hx    Stomach cancer Neg Hx    Rectal cancer Neg Hx     SOCIAL HISTORY: Social History   Socioeconomic History   Marital status: Married    Spouse name: Vinie Kerns   Number of children: 3   Years of education: Not on file   Highest education level: GED or equivalent  Occupational History   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  Was drinking every day prior.  Was drinking due to stress.She and husband drink every evening.  Her counselor, Page, at [employer] is aware of her drinking.   Drug use: Not Currently    Types: Marijuana, Crack cocaine    Comment: Last cocaine use was 30 days ago.  No MJ for 2 months.   Sexual activity: Yes    Birth control/protection: Surgical  Other Topics Concern   Not on file  Social History Narrative   She and her husband now live in a home with her daughter and daughter's 2 children as well as patient's brother, who is just out of a supervised living situation for those living with Mental illness.  He has schizophrenia.  Previously lived with daughter and daughter's partner--lot of stress as they split up and finding this new home in November.        Born and raised in [place]    Social Drivers of Health   Financial Resource Strain: Low Risk  (01/23/2022)   Overall Financial Resource Strain (CARDIA)    Difficulty of Paying Living Expenses: Not very hard  Food Insecurity: No Food Insecurity (11/29/2023)   Hunger Vital Sign    Worried About Running Out of Food in the Last Year: Never true    Ran Out of Food in the Last Year: Never  true   Transportation Needs: No Transportation Needs (11/29/2023)   PRAPARE - Administrator, Civil Service (Medical): No    Lack of Transportation (Non-Medical): No  Physical Activity: Not on file  Stress: Not on file  Social Connections: Not on file  Intimate Partner Violence: Not At Risk (11/29/2023)   Humiliation, Afraid, Rape, and Kick questionnaire    Fear of Current or Ex-Partner: No    Emotionally Abused: No    Physically Abused: No    Sexually Abused: No       PHYSICAL EXAM  Vitals:   07/27/24 0849  BP: (!) 142/88  Pulse: 85  SpO2: 95%  Weight: 202 lb 8 oz (91.9 kg)  Height: 5' 2 (1.575 m)    Body mass index is 37.04 kg/m.   General: The patient is well-developed and well-nourished and in no acute distress  HEENT:  Head is [place]/AT.  Sclera are anicteric.  Funduscopic exam shows normal optic discs and retinal vessels.  Neck: Good range of motion.  The neck is nontender.   Skin: Extremities are without rash or  edema.  Musculoskeletal:  Back is nontender  Neurologic Exam  Mental status: The patient is alert and oriented x 3 at the time of the examination. The patient has apparent normal recent and remote memory, with an apparently normal attention span and concentration ability.   Speech is normal.  Cranial nerves: Extraocular movements are full. Pupils are equal, round, and reactive to light and accomodation.   There is good facial sensation to soft touch bilaterally.Facial strength is normal.  Trapezius and sternocleidomastoid strength is normal. No dysarthria is noted.   No obvious hearing deficits are noted.  Motor:  Muscle bulk is normal.   Tone is normal. Strength is  4/5 in deltoid, 4+/5 in biceps and 4+/5 in forearm pronators/finger extensors and 5/5 elsewhere in right arm. 5 / 5 in other 3 limbs.   Sensory: Sensory testing shows mild reduced temperature over right hypothenar eminence/C8 and +/- reduced temperature/touch in right leg relative left.   Vibration was symmetric.   Coordination: Cerebellar testing reveals good finger-nose-finger and heel-to-shin bilaterally.  Gait and station: Station is normal.   Gait is mildly arthritis. Tandem gait is slightly wide. Romberg is negative.   Reflexes: Deep tendon reflexes are symmetric and normal bilaterally.   Plantar responses are flexor.    DIAGNOSTIC DATA (LABS, IMAGING, TESTING) - I reviewed patient records, labs, notes, testing and imaging myself where available.  Lab Results  Component Value Date   WBC 9.0 02/20/2024   HGB 15.0 02/20/2024   HCT 45.6 02/20/2024   MCV 87 02/20/2024   PLT 357 02/20/2024      Component Value Date/Time   NA 144 02/20/2024 1007   K 4.7 02/20/2024 1007   CL 105 02/20/2024 1007   CO2 25 02/20/2024 1007   GLUCOSE 97 02/20/2024 1007   GLUCOSE 132 (H) 11/29/2023 0504   BUN 12 02/20/2024 1007   CREATININE 0.79 02/20/2024 1007   CALCIUM  9.5 02/20/2024 1007   PROT 6.4 02/20/2024 1007   ALBUMIN 4.1 02/20/2024 1007   AST 18 02/20/2024 1007   ALT 29 02/20/2024 1007   ALKPHOS 99 02/20/2024 1007   BILITOT 0.2 02/20/2024 1007   GFRNONAA >60 11/29/2023 0504   GFRAA 86 04/27/2020 1116   Lab Results  Component Value Date   CHOL 186 02/20/2024   HDL 58 02/20/2024   LDLCALC 110 (H) 02/20/2024   TRIG 101 02/20/2024  CHOLHDL 4.3 07/03/2018   Lab Results  Component Value Date   HGBA1C 6.0 (H) 02/20/2024   Lab Results  Component Value Date   VITAMINB12 409 11/29/2023   Lab Results  Component Value Date   TSH 0.828 05/23/2012       ASSESSMENT AND PLAN  Transverse myelitis (HCC) - Plan: Angiotensin converting enzyme, Anti-Aquaporin (AQP4), Serum, MR BRAIN W WO CONTRAST, MR CERVICAL SPINE W WO CONTRAST  Numbness - Plan: MR BRAIN W WO CONTRAST, MR CERVICAL SPINE W WO CONTRAST  Right arm weakness - Plan: MR BRAIN W WO CONTRAST, MR CERVICAL SPINE W WO CONTRAST  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  NMO IgG and anti-MOG were negative at the time.  There is now a better test for neuromyelitis optica (FACS based anti-aquaporin4 IgG) and we will check that as well as angiotensin-converting enzyme to supplement the evaluation done last year.  We will check an MRI of the brain and cervical spine.  If there are additional lesions in a pattern consistent with MS, we will have to consider starting a disease modifying therapy. To help with the dysesthesias started gabapentin . She will return in 1 year if stable or sooner based on the results of the studies or if significant new or worsening neurologic symptoms.  This visit is part of a comprehensive longitudinal care medical relationship regarding the patients primary diagnosis of transverse myelitis and related concerns.   Emilina Smarr A. Vear, MD, [facility] 07/27/2024, 10:46 AM Certified in Neurology, Clinical Neurophysiology, Sleep Medicine and Neuroimaging  [facility] [address], [address] [place], [place] [address] [phone]

## 2024-07-28 ENCOUNTER — Encounter: Payer: Self-pay | Admitting: Neurology

## 2024-07-29 ENCOUNTER — Ambulatory Visit: Payer: Self-pay | Admitting: Neurology

## 2024-07-29 ENCOUNTER — Encounter: Payer: Self-pay | Admitting: Neurology

## 2024-07-29 LAB — ANTI-AQUAPORIN (AQP4), SERUM

## 2024-07-29 LAB — BASIC METABOLIC PANEL WITH GFR
BUN/Creatinine Ratio: 15 (ref 9–23)
BUN: 12 mg/dL (ref 6–24)
CO2: 20 mmol/L (ref 20–29)
Calcium: 9.3 mg/dL (ref 8.7–10.2)
Chloride: 106 mmol/L (ref 96–106)
Creatinine, Ser: 0.79 mg/dL (ref 0.57–1.00)
Glucose: 100 mg/dL — ABNORMAL HIGH (ref 70–99)
Potassium: 4.4 mmol/L (ref 3.5–5.2)
Sodium: 141 mmol/L (ref 134–144)
eGFR: 87 mL/min/1.73 (ref >59)

## 2024-07-29 LAB — ANGIOTENSIN CONVERTING ENZYME: Angio Convert Enzyme: 46 U/L (ref 14–82)

## 2024-08-02 ENCOUNTER — Encounter: Payer: Self-pay | Admitting: Neurology

## 2024-08-06 ENCOUNTER — Other Ambulatory Visit: Payer: MEDICAID

## 2024-08-19 ENCOUNTER — Encounter: Payer: Self-pay | Admitting: Internal Medicine

## 2024-08-19 ENCOUNTER — Ambulatory Visit: Payer: MEDICAID | Admitting: Internal Medicine

## 2024-08-19 VITALS — BP 160/90 | HR 84 | Resp 19 | Ht 62.0 in | Wt 195.0 lb

## 2024-08-19 DIAGNOSIS — Z860101 Personal history of adenomatous and serrated colon polyps: Secondary | ICD-10-CM

## 2024-08-19 DIAGNOSIS — E119 Type 2 diabetes mellitus without complications: Secondary | ICD-10-CM

## 2024-08-19 DIAGNOSIS — F17200 Nicotine dependence, unspecified, uncomplicated: Secondary | ICD-10-CM

## 2024-08-19 DIAGNOSIS — Z780 Asymptomatic menopausal state: Secondary | ICD-10-CM

## 2024-08-19 DIAGNOSIS — R10819 Abdominal tenderness, unspecified site: Secondary | ICD-10-CM

## 2024-08-19 DIAGNOSIS — Z0000 Encounter for general adult medical examination without abnormal findings: Secondary | ICD-10-CM

## 2024-08-19 DIAGNOSIS — Z23 Encounter for immunization: Secondary | ICD-10-CM

## 2024-08-19 DIAGNOSIS — Z716 Tobacco abuse counseling: Secondary | ICD-10-CM

## 2024-08-19 DIAGNOSIS — I10 Essential (primary) hypertension: Secondary | ICD-10-CM

## 2024-08-19 DIAGNOSIS — F411 Generalized anxiety disorder: Secondary | ICD-10-CM

## 2024-08-19 DIAGNOSIS — Z1231 Encounter for screening mammogram for malignant neoplasm of breast: Secondary | ICD-10-CM

## 2024-08-19 DIAGNOSIS — F3130 Bipolar disorder, current episode depressed, mild or moderate severity, unspecified: Secondary | ICD-10-CM

## 2024-08-19 LAB — POCT URINALYSIS DIPSTICK
Bilirubin, UA: NEGATIVE
Blood, UA: NEGATIVE
Glucose, UA: NEGATIVE
Ketones, UA: NEGATIVE
Leukocytes, UA: NEGATIVE
Nitrite, UA: NEGATIVE
Protein, UA: NEGATIVE
Spec Grav, UA: 1.015 (ref 1.010–1.025)
Urobilinogen, UA: 1 U/dL
pH, UA: 6 (ref 5.0–8.0)

## 2024-08-19 MED ORDER — METFORMIN HCL ER 500 MG PO TB24: 500.0000 mg | ORAL_TABLET | Freq: Every day | ORAL | 3 refills | Status: DC

## 2024-08-19 MED ORDER — DULOXETINE HCL 20 MG PO CPEP
20.0000 mg | ORAL_CAPSULE | Freq: Every day | ORAL | 3 refills | Status: DC
Start: 2024-08-19 — End: 2024-10-29

## 2024-08-19 NOTE — Progress Notes (Addendum)
 Subjective:    Patient ID: Cheyenne Fowler, female   DOB: August 05, 1966, 58 y.o.   MRN: [id number]   HPI  CPE without pap  1.  Pap:  Last in 2023 and normal.    2.  Mammogram:  Last 05/2023 and normal.  One maternal aunt died from breast cancer in 6s or 13s.  Missed her appt this year.  3.  Osteoprevention:  Drinking almond milk twice daily.  Eats cheese occasionally.  Willing to drink another serving of almond milk.  Walks throughout the day.    Outdoors everyday 30 minutes.    4.  Guaiac Cards/FIT:  Last performed in 2023 and negative for blood.   5.  Colonoscopy:  Last in 01/2022 with Dr. Eda.  Not clear why Health Maintenance has her returning in 10 years as she was told 3 years with 6 tubular adenomas on this exam.    6.  Immunizations:  Needs 2nd Shingrix  and pneumococcal 20. Immunization History  Administered Date(s) Administered   Dtap, Unspecified 04/12/1968, 05/10/1968, 06/07/1968, 08/07/1970   Influenza Inj Mdck Quad Pf 11/22/2021   MMR 03/14/1975   Measles 08/09/1968   Moderna Covid-19 Vaccine Bivalent Booster 17yrs & up 01/23/2022   Moderna Sars-Covid-2 Vaccination 06/27/2020, 07/12/2020   PFIZER(Purple Top)SARS-COV-2 Vaccination 06/05/2020   Polio, Unspecified 04/12/1968, 06/07/1968, 08/07/1970, 03/14/1975, 11/25/1978   Smallpox 08/07/1970   Tdap 07/22/2018   Tetanus 11/25/1978   Zoster Recombinant(Shingrix ) 01/23/2022     7.  Glucose/Cholesterol:  Diabetes, last A1C 6.0% earlier in Feb.  States she is taking Metformin , but unable to find in med list.  Cholesterol LDL a bit higher than would like.   Lipid Panel     Component Value Date/Time   CHOL 186 02/20/2024 1007   TRIG 101 02/20/2024 1007   TRIG 125 04/04/2016 1048   HDL 58 02/20/2024 1007   CHOLHDL 4.3 07/03/2018 1208   VLDL 31 07/03/2018 1208   LDLCALC 110 (H) 02/20/2024 1007   LABVLDL 18 02/20/2024 1007     Current Meds  Medication Sig   atorvastatin  (LIPITOR ) 80 MG tablet Take 1 tablet  (80 mg total) by mouth daily.   Blood Pressure Monitoring (CLEVER CHOICE BP MONITOR/ARM) DEVI Use to check blood pressure 3 times weekly and as needed   carvedilol  (COREG ) 3.125 MG tablet TAKE 1 TABLET BY MOUTH TWICE DAILY WITH MEALS   cetirizine  (ZYRTEC ) 10 MG tablet Take 1 tablet by mouth once daily   gabapentin  (NEURONTIN ) 300 MG capsule Take 1 capsule (300 mg total) by mouth 3 (three) times daily.   hydrOXYzine  (VISTARIL ) 50 MG capsule Take 1 capsule (50 mg total) by mouth every 6 (six) hours as needed.   isosorbide  mononitrate (IMDUR ) 30 MG 24 hr tablet Take 1 tablet by mouth once daily   lamoTRIgine  (LAMICTAL ) 200 MG tablet Take 1 tablet (200 mg total) by mouth daily.   losartan  (COZAAR ) 50 MG tablet Take 1 tablet by mouth once daily   Melatonin 5 MG CHEW Chew 5-10 mg by mouth at bedtime as needed.   nitroGLYCERIN  (NITROSTAT ) 0.4 MG SL tablet Place 1 tablet (0.4 mg total) under the tongue every 5 (five) minutes as needed for chest pain.   pantoprazole  (PROTONIX ) 40 MG tablet Take 1 tablet by mouth twice daily   QUEtiapine  (SEROQUEL ) 100 MG tablet Take 1 tablet (100 mg total) by mouth at bedtime.  Taking Metformin  500 mg twice daily--apparently removed from list last month with Neurology Allergies  Allergen Reactions  Penicillins Hives   Sulfonamide Derivatives Hives   Past Medical History:  Diagnosis Date   Anemia    Bipolar disorder (HCC)    Diabetes mellitus (HCC) 2022   type 2   Environmental and seasonal allergies 1968   GERD (gastroesophageal reflux disease)    Heart murmur    Hypertension    Upper GI bleed 01/15/2022   Past Surgical History:  Procedure Laterality Date   BIOPSY  01/17/2022   Procedure: BIOPSY;  Surgeon: Eda Iha, MD;  Location: [facility] ENDOSCOPY;  Service: Gastroenterology;;   CESAREAN SECTION  1982, 1983, 1990   ESOPHAGOGASTRODUODENOSCOPY (EGD) WITH PROPOFOL  N/A 01/17/2022   Procedure: ESOPHAGOGASTRODUODENOSCOPY (EGD) WITH PROPOFOL ;  Surgeon:  Eda Iha, MD;  Location: [facility] ENDOSCOPY;  Service: Gastroenterology;  Laterality: N/A;   HERNIA REPAIR     Umbilical hernia in the 2nd grade   LEFT HEART CATH AND CORONARY ANGIOGRAPHY N/A 05/06/2023   Procedure: LEFT HEART CATH AND CORONARY ANGIOGRAPHY;  Surgeon: Wonda Sharper, MD;  Location: [facility] INVASIVE CV LAB;  Service: Cardiovascular;  Laterality: N/A;   TUBAL LIGATION  1990   Family History  Problem Relation Age of Onset   Diabetes Mother    Cancer Mother        liver--not clear if this was the primary   COPD Mother    Bipolar disorder Sister    Diabetes Sister    Schizophrenia Brother    Bipolar disorder Brother    Cancer Brother        Not sure of primary   Cancer Maternal Aunt        Breast cancer   Breast cancer Maternal Aunt        Cause of death   Cancer Maternal Aunt        pancreatic   Cancer Maternal Grandfather        throat   Colon cancer Neg Hx    Colon polyps Neg Hx    Esophageal cancer Neg Hx    Stomach cancer Neg Hx    Rectal cancer Neg Hx    Social History   Socioeconomic History   Marital status: Married    Spouse name: Vinie Kerns   Number of children: 3   Years of education: Not on file   Highest education level: GED or equivalent  Occupational History   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  Was drinking every day prior.  Was drinking due to stress.She and husband drink every evening.  Her counselor, Page, at [employer] is aware of her drinking.   Drug use: Not Currently    Types: Marijuana, Crack cocaine    Comment: Last cocaine use was 30 days ago.  No MJ  for 2 months.   Sexual activity: Yes    Birth control/protection: Surgical  Other Topics Concern   Not on file  Social History Narrative   Currently living with husband, daughter, daughter's 2 children and a roommate.    Social Drivers of Health   Financial Resource Strain: Low Risk  (01/23/2022)   Overall Financial Resource Strain (CARDIA)  Difficulty of Paying Living Expenses: Not very hard  Food Insecurity: No Food Insecurity (11/29/2023)   Hunger Vital Sign    Worried About Running Out of Food in the Last Year: Never true    Ran Out of Food in the Last Year: Never true  Transportation Needs: No Transportation Needs (11/29/2023)   PRAPARE - Administrator, Civil Service (Medical): No    Lack of Transportation (Non-Medical): No  Physical Activity: Not on file  Stress: Not on file  Social Connections: Not on file  Intimate Partner Violence: Not At Risk (11/29/2023)   Humiliation, Afraid, Rape, and Kick questionnaire    Fear of Current or Ex-Partner: No    Emotionally Abused: No    Physically Abused: No    Sexually Abused: No       Review of Systems  HENT:  Negative for dental problem (Has dental care.).   Eyes:  Negative for visual disturbance (stable with glasses.).  Respiratory:  Positive for cough (Does not cough up anything.), shortness of breath and wheezing.   Cardiovascular:  Positive for palpitations (with stress.) and leg swelling. Negative for chest pain.  Gastrointestinal:  Positive for abdominal pain (Pain across bilateral lower abdomen when stands.  Also notes after urination or BM.). Negative for blood in stool (No melena).  Genitourinary:  Negative for dysuria.  Neurological:  Positive for numbness (Right arm, thigh residual since transverse myelitis.).      Objective:   BP (!) 160/90 (BP Location: Left Arm, Patient Position: Sitting, Cuff Size: Normal)   Pulse 84   Resp 19   Ht 5' 2 (1.575 m)   Wt 195 lb (88.5 kg)   LMP 06/09/2018 (Exact  Date) Comment: Bleeding one month ago.  BMI 35.67 kg/m   Physical Exam Constitutional:      Appearance: She is obese.  HENT:     Head: Normocephalic and atraumatic.     Right Ear: Tympanic membrane, ear canal and external ear normal.     Left Ear: Tympanic membrane, ear canal and external ear normal.     Nose: Nose normal.     Mouth/Throat:     Mouth: Mucous membranes are moist.     Pharynx: Oropharynx is clear.  Eyes:     Extraocular Movements: Extraocular movements intact.     Conjunctiva/sclera: Conjunctivae normal.     Pupils: Pupils are equal, round, and reactive to light.     Comments: Discs sharp  Neck:     Thyroid: No thyroid mass or thyromegaly.  Cardiovascular:     Rate and Rhythm: Normal rate and regular rhythm.     Pulses:          Dorsalis pedis pulses are 2+ on the right side and 2+ on the left side.       Posterior tibial pulses are 2+ on the right side and 2+ on the left side.     Heart sounds: S1 normal and S2 normal. No murmur heard.    No friction rub. No S3 or S4 sounds.     Comments: No carotid bruits.  Carotid, radial, femoral, DP and PT pulses normal and equal.   Pulmonary:     Effort: Pulmonary effort is normal.     Breath sounds: Normal breath sounds and air entry.  Chest:  Breasts:    Right: No inverted nipple, mass or nipple discharge.     Left: No inverted nipple, mass or nipple discharge.  Abdominal:  General: Bowel sounds are normal.     Palpations: Abdomen is soft. There is no hepatomegaly, splenomegaly or mass.     Tenderness: There is abdominal tenderness (minimal) in the suprapubic area.  Genitourinary:    General: Normal vulva.     Comments: No uterine or adnexal mass or tenderness. Musculoskeletal:        General: Normal range of motion.     Cervical back: Normal range of motion and neck supple.     Right lower leg: No edema.     Left lower leg: No edema.  Feet:     Right foot:     Protective Sensation: 10 sites tested.  10  sites sensed.     Skin integrity: Skin integrity normal.     Left foot:     Protective Sensation: 10 sites tested.  10 sites sensed.     Skin integrity: Skin integrity normal.  Lymphadenopathy:     Head:     Right side of head: No submental or submandibular adenopathy.     Left side of head: No submental or submandibular adenopathy.     Cervical: No cervical adenopathy.     Upper Body:     Right upper body: No supraclavicular or axillary adenopathy.     Left upper body: No supraclavicular or axillary adenopathy.     Lower Body: No right inguinal adenopathy. No left inguinal adenopathy.  Skin:    General: Skin is warm.     Capillary Refill: Capillary refill takes less than 2 seconds.     Findings: No rash.  Neurological:     General: No focal deficit present.     Mental Status: She is alert and oriented to person, place, and time.     Cranial Nerves: Cranial nerves 2-12 are intact.     Motor: Motor function is intact.     Coordination: Coordination is intact.     Gait: Gait is intact.     Deep Tendon Reflexes: Reflexes are normal and symmetric.  Psychiatric:        Mood and Affect: Mood normal.        Speech: Speech normal.        Behavior: Behavior normal. Behavior is cooperative.      Assessment & Plan   CPE without pap Shingrix  and Pneumococcal 20. Return for influenza vaccine in Sept.   Encouraged COvID vaccine as well.   DEXA and Mammogram  2  Hypertension:  Intermittently missing meds.  Takes first meds at 11 a.m. as does not eat breakfast.  She will start eating a small breakfast around 8 am and take morning meds then.  BP not controlled, repeat check in2 weeks with fasting labs and likely increase Carvedilol  if not at goal.    3. DM:  will change to Metformin  XR 500 mg once daily.  4.  Bipolar I/GAD:  refilled Cymbalta  for her.  As per psych.  5.  Tobacco abuse:  encouraged nicotine  gum or lozenges.  Needs to get rid of smoking paraphernalia when starts.  6.   History of adenomatous colon polyps: referral back to GI to set up with follow up colonoscopy for 2026.    7. Suprapubic tenderness:  UA

## 2024-09-01 ENCOUNTER — Ambulatory Visit: Payer: MEDICAID | Admitting: Neurology

## 2024-09-03 ENCOUNTER — Other Ambulatory Visit (INDEPENDENT_AMBULATORY_CARE_PROVIDER_SITE_OTHER): Payer: MEDICAID

## 2024-09-03 VITALS — BP 150/90 | HR 80

## 2024-09-03 DIAGNOSIS — E1169 Type 2 diabetes mellitus with other specified complication: Secondary | ICD-10-CM

## 2024-09-03 DIAGNOSIS — E785 Hyperlipidemia, unspecified: Secondary | ICD-10-CM

## 2024-09-03 DIAGNOSIS — E119 Type 2 diabetes mellitus without complications: Secondary | ICD-10-CM

## 2024-09-03 DIAGNOSIS — Z0130 Encounter for examination of blood pressure without abnormal findings: Secondary | ICD-10-CM

## 2024-09-03 NOTE — Progress Notes (Unsigned)
 Patient reports that she has been taking bp medication consistently. Patient did not take bp medication this morning.

## 2024-09-04 LAB — CBC WITH DIFFERENTIAL/PLATELET
Basophils Absolute: 0.1 x10E3/uL (ref 0.0–0.2)
Basos: 1 %
EOS (ABSOLUTE): 0.2 x10E3/uL (ref 0.0–0.4)
Eos: 3 %
Hematocrit: 48.2 % — ABNORMAL HIGH (ref 34.0–46.6)
Hemoglobin: 15.2 g/dL (ref 11.1–15.9)
Immature Grans (Abs): 0 x10E3/uL (ref 0.0–0.1)
Immature Granulocytes: 0 %
Lymphocytes Absolute: 4.3 x10E3/uL — ABNORMAL HIGH (ref 0.7–3.1)
Lymphs: 53 %
MCH: 28.2 pg (ref 26.6–33.0)
MCHC: 31.5 g/dL (ref 31.5–35.7)
MCV: 89 fL (ref 79–97)
Monocytes Absolute: 0.7 x10E3/uL (ref 0.1–0.9)
Monocytes: 9 %
Neutrophils Absolute: 2.6 x10E3/uL (ref 1.4–7.0)
Neutrophils: 33 %
Platelets: 318 x10E3/uL (ref 150–450)
RBC: 5.39 x10E6/uL — ABNORMAL HIGH (ref 3.77–5.28)
RDW: 12.9 % (ref 11.7–15.4)
WBC: 8 x10E3/uL (ref 3.4–10.8)

## 2024-09-04 LAB — HEMOGLOBIN A1C
Est. average glucose Bld gHb Est-mCnc: 128 mg/dL
Hgb A1c MFr Bld: 6.1 % — ABNORMAL HIGH (ref 4.8–5.6)

## 2024-09-04 LAB — LIPID PANEL
Chol/HDL Ratio: 4.3 ratio (ref 0.0–4.4)
Cholesterol, Total: 219 mg/dL — ABNORMAL HIGH (ref 100–199)
HDL: 51 mg/dL (ref >39)
LDL Chol Calc (NIH): 149 mg/dL — ABNORMAL HIGH (ref 0–99)
Triglycerides: 108 mg/dL (ref 0–149)
VLDL Cholesterol Cal: 19 mg/dL (ref 5–40)

## 2024-09-04 LAB — COMPREHENSIVE METABOLIC PANEL WITH GFR
ALT: 28 IU/L (ref 0–32)
AST: 21 IU/L (ref 0–40)
Albumin: 4.3 g/dL (ref 3.8–4.9)
Alkaline Phosphatase: 111 IU/L (ref 44–121)
BUN/Creatinine Ratio: 11 (ref 9–23)
BUN: 10 mg/dL (ref 6–24)
Bilirubin Total: 0.3 mg/dL (ref 0.0–1.2)
CO2: 23 mmol/L (ref 20–29)
Calcium: 10 mg/dL (ref 8.7–10.2)
Chloride: 103 mmol/L (ref 96–106)
Creatinine, Ser: 0.88 mg/dL (ref 0.57–1.00)
Globulin, Total: 2.9 g/dL (ref 1.5–4.5)
Glucose: 110 mg/dL — ABNORMAL HIGH (ref 70–99)
Potassium: 4.7 mmol/L (ref 3.5–5.2)
Sodium: 141 mmol/L (ref 134–144)
Total Protein: 7.2 g/dL (ref 6.0–8.5)
eGFR: 76 mL/min/1.73 (ref >59)

## 2024-09-04 LAB — MICROALBUMIN / CREATININE URINE RATIO

## 2024-09-14 ENCOUNTER — Other Ambulatory Visit: Payer: MEDICAID

## 2024-09-24 ENCOUNTER — Ambulatory Visit: Payer: MEDICAID

## 2024-10-04 ENCOUNTER — Encounter (HOSPITAL_COMMUNITY): Payer: Self-pay

## 2024-10-04 ENCOUNTER — Other Ambulatory Visit: Payer: Self-pay

## 2024-10-04 ENCOUNTER — Emergency Department (HOSPITAL_COMMUNITY)
Admission: EM | Admit: 2024-10-04 | Discharge: 2024-10-04 | Disposition: A | Discharge status: Home / Self Care | Payer: MEDICAID

## 2024-10-04 ENCOUNTER — Emergency Department (HOSPITAL_COMMUNITY): Payer: MEDICAID

## 2024-10-04 DIAGNOSIS — Z79899 Other long term (current) drug therapy: Secondary | ICD-10-CM | POA: Diagnosis not present

## 2024-10-04 DIAGNOSIS — R059 Cough, unspecified: Secondary | ICD-10-CM | POA: Diagnosis present

## 2024-10-04 DIAGNOSIS — I10 Essential (primary) hypertension: Secondary | ICD-10-CM | POA: Insufficient documentation

## 2024-10-04 DIAGNOSIS — J0110 Acute frontal sinusitis, unspecified: Secondary | ICD-10-CM | POA: Diagnosis not present

## 2024-10-04 LAB — RESP PANEL BY RT-PCR (RSV, FLU A&B, COVID)  RVPGX2
Influenza A by PCR: NEGATIVE
Influenza B by PCR: NEGATIVE
Resp Syncytial Virus by PCR: NEGATIVE
SARS Coronavirus 2 by RT PCR: NEGATIVE

## 2024-10-04 MED ORDER — LEVOFLOXACIN 750 MG PO TABS: 750.0000 mg | ORAL_TABLET | Freq: Every day | ORAL | 0 refills | Status: DC

## 2024-10-04 NOTE — ED Triage Notes (Signed)
 Patient has had generalized body aches for 2 weeks. Has headache, head congestion, runny nose. Headache goes away with ibuprofen .

## 2024-10-04 NOTE — Discharge Instructions (Signed)
 I did prescribe you antibiotic.  Please take this antibiotic as prescribed.  This should help with your sinus issues.  If anything changes such as recurrent symptoms or worsening symptoms then please see ED for further evaluation.  Take over-the-counter medication for this congestion.  It should resolve with antibiotic use.

## 2024-10-04 NOTE — ED Provider Notes (Signed)
 [place] EMERGENCY DEPARTMENT AT [facility] Provider Note   CSN: [id number] Arrival date & time: 10/04/24  9175     Patient presents with: Generalized Body Aches   Cheyenne Fowler is a 58 y.o. female.   HPI     Patient presents because of cough, runny nose, sinus congestion has been present for about 2 weeks.  Patient states that initially 2 other family was sick.  Patient states that very similar symptoms to hers.  Their symptoms resolved essentially she started feeling ill and subsequently is been having a runny nose with a cough and episodic headaches for the past 2 weeks or so.  No chest pain or shortness of breath.  No pleuritic chest pain or hemoptysis.  No exertional chest pain.  No exertional shortness of breath.  No nausea vomit diarrhea.  No abdominal pain.  Endorses some right sacroiliac pain that been chronic for her.  No hemoptysis.  Previous medical history reviewed : Patient was last seen by PCP on August 19, 2024.  Normal checkup.   Prior to Admission medications   Medication Sig Start Date End Date Taking? Authorizing Provider  levofloxacin  (LEVAQUIN ) 750 MG tablet Take 1 tablet (750 mg total) by mouth daily. 10/04/24  Yes Simon Lavonia SAILOR, MD  atorvastatin  (LIPITOR ) 80 MG tablet Take 1 tablet (80 mg total) by mouth daily. 03/08/24   Adella Norris, MD  Blood Pressure Monitoring (CLEVER CHOICE BP MONITOR/ARM) DEVI Use to check blood pressure 3 times weekly and as needed 12/12/23   Adella Norris, MD  carvedilol  (COREG ) 3.125 MG tablet TAKE 1 TABLET BY MOUTH TWICE DAILY WITH MEALS 05/03/24   Adella Norris, MD  cetirizine  (ZYRTEC ) 10 MG tablet Take 1 tablet by mouth once daily 03/29/24   Adella Norris, MD  DULoxetine  (CYMBALTA ) 20 MG capsule Take 1 capsule (20 mg total) by mouth daily. 08/19/24   Adella Norris, MD  gabapentin  (NEURONTIN ) 300 MG capsule Take 1 capsule (300 mg total) by mouth 3 (three) times daily. 07/27/24   Sater,  Charlie LABOR, MD  Glucose Blood (BLOOD GLUCOSE TEST STRIPS) STRP Check blood glucose twice daily before meals Patient not taking: Reported on 08/19/2024 12/12/23   Adella Norris, MD  hydrOXYzine  (VISTARIL ) 50 MG capsule Take 1 capsule (50 mg total) by mouth every 6 (six) hours as needed. 03/18/24   Harl Zane BRAVO, NP  isosorbide  mononitrate (IMDUR ) 30 MG 24 hr tablet Take 1 tablet by mouth once daily 05/03/24   Nahser, Aleene PARAS, MD  lamoTRIgine  (LAMICTAL ) 200 MG tablet Take 1 tablet (200 mg total) by mouth daily. 03/18/24   Harl Zane BRAVO, NP  Lancets Misc. MISC Check blood glucose twice daily before meals Patient not taking: Reported on 07/27/2024 12/12/23   Adella Norris, MD  losartan  (COZAAR ) 50 MG tablet Take 1 tablet by mouth once daily 03/29/24   Adella Norris, MD  Melatonin 5 MG CHEW Chew 5-10 mg by mouth at bedtime as needed. 03/18/24   Harl Zane BRAVO, NP  metFORMIN  (GLUCOPHAGE -XR) 500 MG 24 hr tablet Take 1 tablet (500 mg total) by mouth daily with breakfast. 08/19/24   Adella Norris, MD  nitroGLYCERIN  (NITROSTAT ) 0.4 MG SL tablet Place 1 tablet (0.4 mg total) under the tongue every 5 (five) minutes as needed for chest pain. 03/19/23   Adella Norris, MD  pantoprazole  (PROTONIX ) 40 MG tablet Take 1 tablet by mouth twice daily 05/03/24   Adella Norris, MD  QUEtiapine  (SEROQUEL ) 100 MG tablet Take 1 tablet (  100 mg total) by mouth at bedtime. 03/18/24   Harl Zane BRAVO, NP    Allergies: Penicillins and Sulfonamide derivatives    Review of Systems  Constitutional:  Negative for chills and fever.  HENT:  Negative for ear pain and sore throat.   Eyes:  Negative for pain and visual disturbance.  Respiratory:  Negative for cough and shortness of breath.   Cardiovascular:  Negative for chest pain and palpitations.  Gastrointestinal:  Negative for abdominal pain and vomiting.  Genitourinary:  Negative for dysuria and hematuria.  Musculoskeletal:   Negative for arthralgias and back pain.  Skin:  Negative for color change and rash.  Neurological:  Negative for seizures and syncope.  All other systems reviewed and are negative.   Updated Vital Signs BP (!) 154/81   Pulse 86   Temp 98.1 F (36.7 C) (Oral)   Resp 19   Ht 5' 2 (1.575 m)   Wt 90.3 kg   LMP 06/09/2018 (Exact Date) Comment: Bleeding one month ago.  SpO2 100%   BMI 36.40 kg/m   Physical Exam Vitals and nursing note reviewed.  Constitutional:      General: She is not in acute distress.    Appearance: She is well-developed.  HENT:     Head: Normocephalic and atraumatic.  Eyes:     Conjunctiva/sclera: Conjunctivae normal.  Cardiovascular:     Rate and Rhythm: Normal rate and regular rhythm.     Heart sounds: No murmur heard. Pulmonary:     Effort: Pulmonary effort is normal. No respiratory distress.     Breath sounds: Normal breath sounds.  Abdominal:     Palpations: Abdomen is soft.     Tenderness: There is no abdominal tenderness.  Musculoskeletal:        General: No swelling.     Cervical back: Neck supple.  Skin:    General: Skin is warm and dry.     Capillary Refill: Capillary refill takes less than 2 seconds.  Neurological:     Mental Status: She is alert.  Psychiatric:        Mood and Affect: Mood normal.     (all labs ordered are listed, but only abnormal results are displayed) Labs Reviewed  RESP PANEL BY RT-PCR (RSV, FLU A&B, COVID)  [id number]    EKG: None  Radiology: DG Chest 2 View Result Date: 10/04/2024 CLINICAL DATA:  Fever and chest congestion for the past 2 weeks. EXAM: CHEST - 2 VIEW COMPARISON:  01/11/2020 FINDINGS: Normal-sized heart. Tortuous and partially calcified thoracic aorta. Clear lungs with normal vascularity. Interval mild peribronchial thickening. Mild thoracic spine degenerative changes. IMPRESSION: Mild bronchitic changes. Electronically Signed   By: Elspeth Bathe M.D.   On: 10/04/2024 09:00     Procedures    Medications Ordered in the ED - No data to display                                  Medical Decision Making Amount and/or Complexity of Data Reviewed Radiology: ordered.  Risk Prescription drug management.     HPI:   Patient presents because of cough, runny nose, sinus congestion has been present for about 2 weeks.  Patient states that initially 2 other family was sick.  Patient states that very similar symptoms to hers.  Their symptoms resolved essentially she started feeling ill and subsequently is been having a runny nose with a cough and episodic  headaches for the past 2 weeks or so.  No chest pain or shortness of breath.  No pleuritic chest pain or hemoptysis.  No exertional chest pain.  No exertional shortness of breath.  No nausea vomit diarrhea.  No abdominal pain.  Endorses some right sacroiliac pain that been chronic for her.  No hemoptysis.  Previous medical history reviewed : Patient was last seen by PCP on August 19, 2024.  Normal checkup.  MDM:    Upon exam, patient hemodynamically stable.  O2 saturation 98% on room air.  Afebrile.  No tachycardia.  Slightly hypertensive but otherwise vital signs stable.  Patient ANO x 3 with GCS 15.  No focal deficits.  Patient had benign exam.  Lung sounds clear to auscultation  bilaterally.  Soft and benign abdomen.  No rebound guarding or tenderness.   In terms of patient's runny nose, sinus congestion and pressure, concern for sinusitis given patient has been having symptoms greater than 14 days.  Given this, will start patient on antibiotics to address sinusitis.  History of allergy to penicillins.  Therefore, we will have to give levofloxacin .  Reviewed patient's prior EKGs.  No QTc location of prior EKGs.  Creatinine at baseline based off of September 12 labs.  No occasion for repeat lab draw this point time.  Safety with patient levofloxacin  for 5 days.  Counseled the patient about risk factors with levofloxacin .  Do think  this is reasonable.   COVID RSV and flu negative.  Chest x-ray unremarkable.  Patient remained hematin was stable throughout the ED stay.  Will treat the patient for sinusitis.  Otherwise, patient has no acute complaints.    I have independently interpreted the CXR     Disposition and Follow Up: PCP      Final diagnoses:  Acute non-recurrent frontal sinusitis    ED Discharge Orders          Ordered    levofloxacin  (LEVAQUIN ) 750 MG tablet  Daily        10/04/24 1008               Simon Lavonia SAILOR, MD 10/04/24 1011

## 2024-10-08 ENCOUNTER — Ambulatory Visit
Admission: RE | Admit: 2024-10-08 | Discharge: 2024-10-08 | Disposition: A | Payer: MEDICAID | Source: Ambulatory Visit | Attending: Internal Medicine | Admitting: Internal Medicine

## 2024-10-08 DIAGNOSIS — Z1231 Encounter for screening mammogram for malignant neoplasm of breast: Secondary | ICD-10-CM

## 2024-10-24 ENCOUNTER — Ambulatory Visit: Payer: Self-pay | Admitting: Internal Medicine

## 2024-10-24 DIAGNOSIS — Z780 Asymptomatic menopausal state: Secondary | ICD-10-CM | POA: Insufficient documentation

## 2024-10-29 ENCOUNTER — Telehealth (HOSPITAL_COMMUNITY): Payer: MEDICAID | Admitting: Psychiatry

## 2024-10-29 ENCOUNTER — Encounter (HOSPITAL_COMMUNITY): Payer: Self-pay | Admitting: Psychiatry

## 2024-10-29 DIAGNOSIS — F3130 Bipolar disorder, current episode depressed, mild or moderate severity, unspecified: Secondary | ICD-10-CM | POA: Diagnosis not present

## 2024-10-29 DIAGNOSIS — F411 Generalized anxiety disorder: Secondary | ICD-10-CM

## 2024-10-29 DIAGNOSIS — F1490 Cocaine use, unspecified, uncomplicated: Secondary | ICD-10-CM | POA: Diagnosis not present

## 2024-10-29 DIAGNOSIS — F1090 Alcohol use, unspecified, uncomplicated: Secondary | ICD-10-CM

## 2024-10-29 MED ORDER — DULOXETINE HCL 20 MG PO CPEP: 20.0000 mg | ORAL_CAPSULE | Freq: Every day | ORAL | 3 refills | Status: DC

## 2024-10-29 MED ORDER — QUETIAPINE FUMARATE 100 MG PO TABS: 100.0000 mg | ORAL_TABLET | Freq: Every day | ORAL | 3 refills | Status: DC

## 2024-10-29 MED ORDER — HYDROXYZINE PAMOATE 50 MG PO CAPS: 50.0000 mg | ORAL_CAPSULE | Freq: Four times a day (QID) | ORAL | 3 refills | Status: DC | PRN

## 2024-10-29 MED ORDER — LAMOTRIGINE 200 MG PO TABS: 200.0000 mg | ORAL_TABLET | Freq: Every day | ORAL | 3 refills | Status: DC

## 2024-10-29 NOTE — Progress Notes (Signed)
 BH MD/PA/NP OP Progress Note Virtual Visit via Telephone Note  I connected with Cheyenne Fowler on 10/29/24 at  9:30 AM EST by telephone and verified that I am speaking with the correct person using two identifiers.  Location: Patient: home Provider: Clinic   I discussed the limitations, risks, security and privacy concerns of performing an evaluation and management service by telephone and the availability of in person appointments. I also discussed with the patient that there may be a patient responsible charge related to this service. The patient expressed understanding and agreed to proceed.   I provided 20 minutes of non-face-to-face time during this encounter.         10/29/2024 11:36 AM Cheyenne Fowler  MRN:  [id number]  Chief Complaint:  All hell is breaking loose   HPI: 58 year-old female seen today for follow-up psychiatric evaluation. She has a psychiatric history of polysubstance use (cocaine, marijuana, and alcohol), depression, and bipolar disorder. She was being managed on Cymbalta  20 mg  daily, Lamictal  200 mg daily, Seroquel  100 mg nightly, and Vistaril  50mg  4 times daily as needed. She reports that her medications are effective in managing her psychiatric conditions.  Today informed writer that all hell has broken loose. He notes that her husbands health has been declining. He had pancreatic surgery and intense pain. He has stopped drinking. She notes that she felt overwhelmed because she had to care for her husband, daughter, and grandchildren. Patient notes that the intense stress caused her to be without her medications. She also notes that she has been stressed financially. She reports that she continues to live with her daughter but had some issues. She notes that her daughter asked her and her husband to leave and moved another tenant in. They have since has reconciled with her daughter but notes that her husband is living with a friend. She notes that she pays  the light bill and part of the rent.  Patient notes that the above exacerbates her anxiety and depression.  Today provider conducted a GAD-7 and patient scored an 18 .  Provider also conducted PHQ-9 of he scored 13.  She notes that her sleep and appetite is poor. To cope she notes that she uses cocaine and alcohol. She also notes that she smokes 5 cigarettes daily. Today she denies SI/HI/AH, mania, paranoia.    Patient informed writer that she has been having chronic pain in her neck, back, and shoulder.    Today she quantifies her pain as 6 out of 10.  She reports that gabapentin  is somewhat effective in managing her pain.   At this time she request that her medications not be adjusted. No medications changes made today. Patient agreeable to continue medications as prescribed.  No other concerns at this time. Visit Diagnosis:    ICD-10-CM   1. Cocaine use  F14.90     2. Bipolar I disorder, most recent episode depressed (HCC)  F31.30 QUEtiapine  (SEROQUEL ) 100 MG tablet    DULoxetine  (CYMBALTA ) 20 MG capsule    lamoTRIgine  (LAMICTAL ) 200 MG tablet    hydrOXYzine  (VISTARIL ) 50 MG capsule    3. Generalized anxiety disorder  F41.1 DULoxetine  (CYMBALTA ) 20 MG capsule    4. Alcohol use  F10.90            Past Psychiatric History: Depression, substance induced mood disorder, Bipolar disorder,  Past Medical History:  Past Medical History:  Diagnosis Date   Anemia    Bipolar disorder (HCC)  Diabetes mellitus (HCC) 2022   type 2   Environmental and seasonal allergies 1968   GERD (gastroesophageal reflux disease)    Heart murmur    Hypertension    Upper GI bleed 01/15/2022    Past Surgical History:  Procedure Laterality Date   BIOPSY  01/17/2022   Procedure: BIOPSY;  Surgeon: Eda Iha, MD;  Location: [facility] ENDOSCOPY;  Service: Gastroenterology;;   CESAREAN SECTION  1982, 1983, 1990   ESOPHAGOGASTRODUODENOSCOPY (EGD) WITH PROPOFOL  N/A 01/17/2022   Procedure:  ESOPHAGOGASTRODUODENOSCOPY (EGD) WITH PROPOFOL ;  Surgeon: Eda Iha, MD;  Location: [facility] ENDOSCOPY;  Service: Gastroenterology;  Laterality: N/A;   HERNIA REPAIR     Umbilical hernia in the 2nd grade   LEFT HEART CATH AND CORONARY ANGIOGRAPHY N/A 05/06/2023   Procedure: LEFT HEART CATH AND CORONARY ANGIOGRAPHY;  Surgeon: Wonda Sharper, MD;  Location: [facility] INVASIVE CV LAB;  Service: Cardiovascular;  Laterality: N/A;   TUBAL LIGATION  1990    Family Psychiatric History: Brother schizophrenia and bipolar, sister bipolar disorder  Family History:  Family History  Problem Relation Age of Onset   Diabetes Mother    Cancer Mother        liver--not clear if this was the primary   COPD Mother    Bipolar disorder Sister    Diabetes Sister    Schizophrenia Brother    Bipolar disorder Brother    Cancer Brother        Not sure of primary   Cancer Maternal Aunt        Breast cancer   Breast cancer Maternal Aunt        Cause of death   Cancer Maternal Aunt        pancreatic   Cancer Maternal Grandfather        throat   Colon cancer Neg Hx    Colon polyps Neg Hx    Esophageal cancer Neg Hx    Stomach cancer Neg Hx    Rectal cancer Neg Hx     Social History:  Social History   Socioeconomic History   Marital status: Married    Spouse name: Vinie Kerns   Number of children: 3   Years of education: Not on file   Highest education level: GED or equivalent  Occupational History   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    Social Drivers of Corporate Investment Banker Strain: Low Risk  (01/23/2022)   Overall Financial Resource Strain (CARDIA)    Difficulty of Paying Living Expenses: Not very hard  Food Insecurity: No Food Insecurity (11/29/2023)   Hunger Vital Sign    Worried About Running Out of Food in the Last Year: Never true    Ran Out of Food in the Last Year: Never true  Transportation Needs: No Transportation Needs (11/29/2023)   PRAPARE - Transportation    Lack  of Transportation (Medical): No    Lack of Transportation (Non-Medical): No  Physical Activity: Not on file  Stress: Not on file  Social Connections: Not on file    Allergies:  Allergies  Allergen Reactions   Penicillins Hives   Sulfonamide Derivatives Hives    Metabolic Disorder Labs: Lab Results  Component Value Date   HGBA1C 6.1 (H) 09/03/2024   MPG 128.37 11/29/2023   MPG 119.76 07/03/2018   No results found for: PROLACTIN Lab Results  Component Value Date   CHOL 219 (H) 09/03/2024   TRIG 108 09/03/2024   HDL 51 09/03/2024   CHOLHDL 4.3 09/03/2024   VLDL 31 07/03/2018   LDLCALC 149 (H) 09/03/2024   LDLCALC 110 (H) 02/20/2024   Lab Results  Component Value Date   TSH 0.828 05/23/2012    Therapeutic Level Labs: No results found for: LITHIUM No results found for: VALPROATE No results found for: CBMZ  Current Medications: Current Outpatient Medications  Medication Sig Dispense Refill   atorvastatin  (LIPITOR ) 80 MG tablet Take 1 tablet (80 mg total) by mouth daily. 30 tablet 11   Blood Pressure Monitoring (CLEVER CHOICE BP MONITOR/ARM) DEVI Use to check blood pressure 3 times weekly and as needed 1 each 0   carvedilol  (COREG ) 3.125 MG tablet TAKE 1 TABLET BY MOUTH TWICE DAILY WITH MEALS 60 tablet 11   cetirizine  (ZYRTEC ) 10 MG tablet Take 1 tablet by mouth once daily 30 tablet 11   DULoxetine   (CYMBALTA ) 20 MG capsule Take 1 capsule (20 mg total) by mouth daily. 30 capsule 3   gabapentin  (NEURONTIN ) 300 MG capsule Take 1 capsule (300 mg total) by mouth 3 (three) times daily. 90 capsule 11   Glucose Blood (BLOOD GLUCOSE TEST STRIPS) STRP Check blood glucose twice daily before meals (Patient not taking: Reported on 08/19/2024) 100 strip 11   hydrOXYzine  (VISTARIL ) 50 MG capsule Take 1 capsule (50 mg total) by mouth every 6 (six) hours as needed. 120 capsule 3   isosorbide  mononitrate (IMDUR ) 30 MG 24 hr tablet Take 1 tablet by mouth once daily 90 tablet 0   lamoTRIgine  (LAMICTAL ) 200 MG tablet Take 1 tablet (200 mg total) by mouth daily. 30 tablet 3   Lancets Misc. MISC Check blood glucose twice daily before meals (Patient not taking: Reported on 07/27/2024) 100 each 11   levofloxacin  (LEVAQUIN ) 750 MG tablet Take 1 tablet (750 mg total) by mouth daily. 5 tablet 0   losartan  (COZAAR ) 50 MG tablet Take 1 tablet by mouth once daily 30 tablet 11   Melatonin 5 MG CHEW Chew 5-10 mg by mouth at bedtime as needed. 60 tablet 3   metFORMIN  (GLUCOPHAGE -XR) 500 MG 24 hr tablet Take 1 tablet (500 mg total) by mouth daily with breakfast. 180 tablet 3   nitroGLYCERIN  (NITROSTAT ) 0.4 MG SL tablet Place 1 tablet (0.4 mg total) under the tongue every 5 (five) minutes as needed for chest pain. 25 tablet 1   pantoprazole  (PROTONIX ) 40 MG tablet Take 1 tablet by mouth twice daily 180 tablet 3   QUEtiapine  (SEROQUEL ) 100 MG tablet Take 1 tablet (100 mg total) by mouth at bedtime. 30 tablet 3   No current facility-administered medications for this visit.     Musculoskeletal: Strength & Muscle Tone: within normal limits and telehealth visit Gait & Station: normal, telehealth visit Patient leans: N/A  Psychiatric Specialty Exam: Review of Systems  Last menstrual period 06/09/2018.There is no height or weight on file to calculate BMI.  General Appearance: Well Groomed  Eye Contact:  Good  Speech:  Clear  and Coherent and Normal Rate  Volume:  Normal  Mood:  Anxious and Depressed  Affect:  Congruent  Thought Process:  Coherent, Goal Directed, and Linear  Orientation:  Full (Time, Place, and Person)  Thought Content: WDL and Logical   Suicidal Thoughts:  No  Homicidal Thoughts:  No  Memory:  Immediate;   Good Recent;   Good Remote;   Good  Judgement:  Good  Insight:  Good  Psychomotor Activity:  Normal  Concentration:  Concentration: Good and Attention Span: Good  Recall:  Good  Fund of Knowledge: Good  Language: Good  Akathisia:  No  Handed:  Right  AIMS (if indicated): not done  Assets:  Communication Skills Desire for Improvement Housing Leisure Time Social Support  ADL's:  Intact  Cognition: WNL  Sleep:  Fair   Screenings: CAGE-AID    Flowsheet Row ED to Hosp-Admission (Discharged) from 11/28/2023 in [place] [facility]  CAGE-AID Score 0   GAD-7    Flowsheet Row Video Visit from 10/29/2024 in [facility] Video Visit from 03/18/2024 in [facility] Clinical Support from 10/07/2023 in [facility] Clinical Support from 07/08/2023 in [facility] Video Visit from 01/15/2023 in [facility]  Total GAD-7 Score 18 12 17 20 20    PHQ2-9    Flowsheet Row Video Visit from 10/29/2024 in [facility] Video Visit from 03/18/2024 in [facility] Clinical Support from 10/07/2023 in [facility] Clinical Support from 07/08/2023 in [facility] Video Visit from 01/15/2023 in [facility]  PHQ-2 Total Score 3 5 3 2 5   PHQ-9 Total Score 13 18 12 7 24    Flowsheet Row ED from 10/04/2024 in [facility] Department at [facility] ED to Hosp-Admission (Discharged) from 11/28/2023 in [place]  [facility] ED from 11/27/2023 in [facility] Emergency Department at [facility]  C-SSRS RISK CATEGORY No Risk No Risk No Risk     Assessment and Plan: Patient reports that her anxiety, depression, and sleep continue to be problematic due to life stressors.  At this time she request that her medications not be adjusted.  Patient does note that she copes with cocaine, alcohol, and tobacco.  She is not interested in being substance use therapy at this time.  No medication changes made today.  Patient agreeable to taking medication as prescribed.  1. Bipolar I disorder, most recent episode depressed (HCC)  Continue- QUEtiapine  (SEROQUEL ) 100 MG tablet; Take 1 tablet (100 mg total) by mouth at bedtime.  Dispense: 30 tablet; Refill: 3 Continue- DULoxetine  (CYMBALTA ) 20 MG capsule; Take 1 capsule (20 mg total) by mouth daily.  Dispense: 30 capsule; Refill: 3 Continue- lamoTRIgine  (LAMICTAL ) 200 MG tablet; Take 1 tablet (200 mg total) by mouth daily.  Dispense: 30 tablet; Refill: 3 Continue- hydrOXYzine  (VISTARIL ) 50 MG capsule; Take 1 capsule (50 mg total) by mouth every 6 (six) hours as needed.  Dispense: 120 capsule; Refill: 3  2. Generalized anxiety disorder  Continue- DULoxetine  (CYMBALTA ) 20 MG capsule; Take 1 capsule (20 mg total) by mouth daily.  Dispense: 30 capsule; Refill: 3  3. Cocaine use (Primary)   4. Alcohol use    Follow-up in 2 months Zane FORBES Bach, NP 10/29/2024, 11:36 AM

## 2024-11-11 ENCOUNTER — Telehealth: Payer: Self-pay | Admitting: Neurology

## 2024-11-11 ENCOUNTER — Telehealth: Payer: Self-pay | Admitting: Internal Medicine

## 2024-11-11 NOTE — Telephone Encounter (Signed)
 Patient would like to be seen for , patient states for the past two weeks she feels like she is losing her balance as she falls . Patient states she feels light headed and does not have other symptoms.   Offered patient an appointment for today at 3pm but patient is unable to attend.   We will call patient when there is a cancellation.

## 2024-11-11 NOTE — Telephone Encounter (Signed)
 I called her and gave her [facility]'s phone number to call and reschedule her MRIs.

## 2024-11-11 NOTE — Telephone Encounter (Signed)
 Returned call to patient she report 3 falls in the last 2 weeks due to increased weakness. She hit her right shoulder, her leg, and has hit her head. She reports swelling in her right knee and hand from the fall. I advised to contact her PCP about that but would let Dr. Vear know she has increased weakness and would like to move forward with MRI.

## 2024-11-11 NOTE — Telephone Encounter (Signed)
 Pt called wanting to speak to the nurse regarding the 3 falls she has had in the past two weeks. Pt states that she will just be walking and would just fall. She would also like to discuss that MRI she was to have as well.

## 2024-11-15 NOTE — Telephone Encounter (Signed)
 Patient has been scheduled for 11/16/24 at 11:30am.

## 2024-11-16 ENCOUNTER — Ambulatory Visit (INDEPENDENT_AMBULATORY_CARE_PROVIDER_SITE_OTHER): Payer: MEDICAID | Admitting: Internal Medicine

## 2024-11-16 ENCOUNTER — Encounter: Payer: Self-pay | Admitting: Internal Medicine

## 2024-11-16 VITALS — BP 184/110 | HR 70 | Resp 22 | Ht 63.0 in | Wt 190.0 lb

## 2024-11-16 DIAGNOSIS — F17200 Nicotine dependence, unspecified, uncomplicated: Secondary | ICD-10-CM

## 2024-11-16 DIAGNOSIS — E785 Hyperlipidemia, unspecified: Secondary | ICD-10-CM

## 2024-11-16 DIAGNOSIS — E119 Type 2 diabetes mellitus without complications: Secondary | ICD-10-CM

## 2024-11-16 DIAGNOSIS — R1031 Right lower quadrant pain: Secondary | ICD-10-CM

## 2024-11-16 DIAGNOSIS — K219 Gastro-esophageal reflux disease without esophagitis: Secondary | ICD-10-CM

## 2024-11-16 DIAGNOSIS — I10 Essential (primary) hypertension: Secondary | ICD-10-CM

## 2024-11-16 DIAGNOSIS — R42 Dizziness and giddiness: Secondary | ICD-10-CM

## 2024-11-16 DIAGNOSIS — E1169 Type 2 diabetes mellitus with other specified complication: Secondary | ICD-10-CM

## 2024-11-16 DIAGNOSIS — R55 Syncope and collapse: Secondary | ICD-10-CM

## 2024-11-16 LAB — POCT URINALYSIS DIPSTICK
Blood, UA: NEGATIVE
Glucose, UA: NEGATIVE
Ketones, UA: NEGATIVE
Leukocytes, UA: NEGATIVE
Nitrite, UA: NEGATIVE
Protein, UA: NEGATIVE
Spec Grav, UA: 1.015 (ref 1.010–1.025)
Urobilinogen, UA: 1 U/dL
pH, UA: 6 (ref 5.0–8.0)

## 2024-11-16 MED ORDER — GABAPENTIN 300 MG PO CAPS: 300.0000 mg | ORAL_CAPSULE | Freq: Three times a day (TID) | ORAL | 3 refills | Status: AC

## 2024-11-16 MED ORDER — BLOOD GLUCOSE MONITORING SUPPL DEVI: 0 refills | Status: AC

## 2024-11-16 MED ORDER — NITROGLYCERIN 0.4 MG SL SUBL: 0.4000 mg | SUBLINGUAL_TABLET | SUBLINGUAL | 1 refills | Status: AC | PRN

## 2024-11-16 MED ORDER — LANCETS MISC: 6 refills | Status: AC

## 2024-11-16 MED ORDER — CARVEDILOL 6.25 MG PO TABS: 3.1250 mg | ORAL_TABLET | Freq: Two times a day (BID) | ORAL | 3 refills | Status: DC

## 2024-11-16 MED ORDER — ATORVASTATIN CALCIUM 80 MG PO TABS: 80.0000 mg | ORAL_TABLET | Freq: Every day | ORAL | 3 refills | Status: AC

## 2024-11-16 MED ORDER — BLOOD GLUCOSE TEST VI STRP: ORAL_STRIP | 6 refills | Status: AC

## 2024-11-16 MED ORDER — CETIRIZINE HCL 10 MG PO TABS: 10.0000 mg | ORAL_TABLET | Freq: Every day | ORAL | 3 refills | Status: DC

## 2024-11-16 MED ORDER — PANTOPRAZOLE SODIUM 40 MG PO TBEC: 40.0000 mg | DELAYED_RELEASE_TABLET | Freq: Two times a day (BID) | ORAL | 3 refills | Status: AC

## 2024-11-16 MED ORDER — ISOSORBIDE MONONITRATE ER 30 MG PO TB24: 30.0000 mg | ORAL_TABLET | Freq: Every day | ORAL | 0 refills | Status: AC

## 2024-11-16 MED ORDER — METFORMIN HCL ER 500 MG PO TB24: 500.0000 mg | ORAL_TABLET | Freq: Every day | ORAL | 3 refills | Status: AC

## 2024-11-16 MED ORDER — LOSARTAN POTASSIUM 50 MG PO TABS: 50.0000 mg | ORAL_TABLET | Freq: Every day | ORAL | 3 refills | Status: AC

## 2024-11-16 MED ORDER — LANCET DEVICE MISC: 0 refills | Status: AC

## 2024-11-16 NOTE — Progress Notes (Signed)
 Subjective:    Patient ID: Cheyenne Fowler, female   DOB: [birth year], 58 y.o.   MRN: [id number]   HPI   Light headedness:  States she has had issues with this for years, but in past month, seems more pronounced.    Last time this was severe, where fell down and perhaps lost consciousness was 6 months ago--sounds like out for just seconds.   No palpitations or diaphoresis prior to episode of light headedness.  She may feel a bit shaky. She has never checked her sugar with an episode.    Only med for DM is Metformin  XR 500 mg once daily.  2.  Hypertension:  BP very high today.  She takes Isosorbide  and Carvedilol  in AM and can miss once to twice in the morning.  She does not feel she misses the afternoon carvedilol .  Admits she ran out of Carvedilol  2 days ago.  Last dose of Losartan  today  3.  2 weeks RLQ pain--more so when lies down and sits up .  No dysuria or frequency.  Current Meds  Medication Sig   atorvastatin  (LIPITOR ) 80 MG tablet Take 1 tablet (80 mg total) by mouth daily.   carvedilol  (COREG ) 3.125 MG tablet TAKE 1 TABLET BY MOUTH TWICE DAILY WITH MEALS   cetirizine  (ZYRTEC ) 10 MG tablet Take 1 tablet by mouth once daily   DULoxetine  (CYMBALTA ) 20 MG capsule Take 1 capsule (20 mg total) by mouth daily.   gabapentin  (NEURONTIN ) 300 MG capsule Take 1 capsule (300 mg total) by mouth 3 (three) times daily.   hydrOXYzine  (VISTARIL ) 50 MG capsule Take 1 capsule (50 mg total) by mouth every 6 (six) hours as needed. (Patient taking differently: Take 50 mg by mouth every 8 (eight) hours as needed.)   isosorbide  mononitrate (IMDUR ) 30 MG 24 hr tablet Take 1 tablet by mouth once daily   lamoTRIgine  (LAMICTAL ) 200 MG tablet Take 1 tablet (200 mg total) by mouth daily.   losartan  (COZAAR ) 50 MG tablet Take 1 tablet by mouth once daily   metFORMIN  (GLUCOPHAGE -XR) 500 MG 24 hr tablet Take 1 tablet (500 mg total) by mouth daily with breakfast.   nitroGLYCERIN  (NITROSTAT ) 0.4 MG SL tablet  Place 1 tablet (0.4 mg total) under the tongue every 5 (five) minutes as needed for chest pain.   pantoprazole  (PROTONIX ) 40 MG tablet Take 1 tablet by mouth twice daily   QUEtiapine  (SEROQUEL ) 100 MG tablet Take 1 tablet (100 mg total) by mouth at bedtime.   Allergies  Allergen Reactions   Penicillins Hives   Sulfonamide Derivatives Hives     Review of Systems    Objective:   BP (!) 184/110 (BP Location: Right Arm, Patient Position: Standing, Cuff Size: Normal)   Pulse 70   Resp (!) 22   Ht 5' 3 (1.6 m)   Wt 190 lb (86.2 kg)   LMP 06/09/2018 (Exact Date) Comment: Bleeding one month ago.  BMI 33.66 kg/m   Physical Exam Constitutional:      Appearance: She is obese.  HENT:     Head: Normocephalic and atraumatic.     Right Ear: Tympanic membrane, ear canal and external ear normal.     Left Ear: Tympanic membrane, ear canal and external ear normal.     Nose: Nose normal.     Mouth/Throat:     Mouth: Mucous membranes are moist.     Pharynx: Oropharynx is clear.  Eyes:     Extraocular Movements: Extraocular  movements intact.     Conjunctiva/sclera: Conjunctivae normal.     Pupils: Pupils are equal, round, and reactive to light.     Comments: Discs sharp  Neck:     Thyroid: No thyroid mass or thyromegaly.  Cardiovascular:     Rate and Rhythm: Normal rate and regular rhythm.     Heart sounds: S1 normal and S2 normal. No murmur heard.    No friction rub. No S3 or S4 sounds.     Comments: No carotid bruits.  Carotid, radial, femoral, DP and PT pulses normal and equal.   Pulmonary:     Effort: Pulmonary effort is normal.     Breath sounds: Normal breath sounds and air entry.  Abdominal:     General: Bowel sounds are normal.     Palpations: Abdomen is soft. There is no hepatomegaly, splenomegaly or mass.     Tenderness: There is abdominal tenderness (Mild RLQ tenderness with minimal extension to suprapubic and LLQ.SABRA  No rebound or peritoneal signs.).     Hernia: No  hernia is present.  Musculoskeletal:        General: Normal range of motion.     Cervical back: Normal range of motion and neck supple.     Right lower leg: No edema.     Left lower leg: No edema.  Skin:    General: Skin is warm.     Capillary Refill: Capillary refill takes less than 2 seconds.  Neurological:     General: No focal deficit present.     Mental Status: She is alert and oriented to person, place, and time.     Cranial Nerves: Cranial nerves 2-12 are intact.     Sensory: Sensation is intact.     Motor: Motor function is intact.     Coordination: Coordination is intact.     Gait: Gait is intact.     Deep Tendon Reflexes: Reflexes are normal and symmetric.     Comments: No signicant decrease in strength of right arm on exam, though patient feels weaker in right arm.      Assessment & Plan   Dizziness:  No orthostasis on exam today.  BP quite high however and patient missing meds in past 2 days.  I have asked her to pay attention to any other precipitating symptoms and to check her sugar if has more episodes.   Also check CMP, CBC, TSH, UA and urine culture. Call if continues  2.  Hypertension:  suspect a combination of missing meds and needing higher dosing.  Increase Carvedilol  to 6.25 mg twice daily (decision based on elevated BPs with last several visits.)  BP and pulse check in 1 week.    3.  DM:  controlled, but needs to check sugar if has dizziness.  Glucose monitoring equipment ordered  4.  RLQ pain:  labs as above.  Call if worsens.  5.  Tobacco Use Disorder:  not clear she is ready to quit.  Encouraged   considering nicotine  gum or lozenges.

## 2024-11-19 LAB — CBC WITH DIFFERENTIAL/PLATELET
Basophils Absolute: 0.1 x10E3/uL (ref 0.0–0.2)
Basos: 1 %
EOS (ABSOLUTE): 0.3 x10E3/uL (ref 0.0–0.4)
Eos: 4 %
Hematocrit: 46.4 % (ref 34.0–46.6)
Hemoglobin: 14.7 g/dL (ref 11.1–15.9)
Immature Grans (Abs): 0 x10E3/uL (ref 0.0–0.1)
Immature Granulocytes: 0 %
Lymphocytes Absolute: 4.2 x10E3/uL — ABNORMAL HIGH (ref 0.7–3.1)
Lymphs: 46 %
MCH: 28.9 pg (ref 26.6–33.0)
MCHC: 31.7 g/dL (ref 31.5–35.7)
MCV: 91 fL (ref 79–97)
Monocytes Absolute: 0.9 x10E3/uL (ref 0.1–0.9)
Monocytes: 10 %
Neutrophils Absolute: 3.5 x10E3/uL (ref 1.4–7.0)
Neutrophils: 39 %
Platelets: 331 x10E3/uL (ref 150–450)
RBC: 5.08 x10E6/uL (ref 3.77–5.28)
RDW: 14.4 % (ref 11.7–15.4)
WBC: 9 x10E3/uL (ref 3.4–10.8)

## 2024-11-19 LAB — COMPREHENSIVE METABOLIC PANEL WITH GFR
ALT: 23 IU/L (ref 0–32)
AST: 20 IU/L (ref 0–40)
Albumin: 4.4 g/dL (ref 3.8–4.9)
Alkaline Phosphatase: 103 IU/L (ref 49–135)
BUN/Creatinine Ratio: 14 (ref 9–23)
BUN: 11 mg/dL (ref 6–24)
Bilirubin Total: <0.2 mg/dL (ref 0.0–1.2)
CO2: 26 mmol/L (ref 20–29)
Calcium: 9.5 mg/dL (ref 8.7–10.2)
Chloride: 104 mmol/L (ref 96–106)
Creatinine, Ser: 0.78 mg/dL (ref 0.57–1.00)
Globulin, Total: 2.5 g/dL (ref 1.5–4.5)
Glucose: 88 mg/dL (ref 70–99)
Potassium: 4.5 mmol/L (ref 3.5–5.2)
Sodium: 143 mmol/L (ref 134–144)
Total Protein: 6.9 g/dL (ref 6.0–8.5)
eGFR: 87 mL/min/1.73 (ref >59)

## 2024-11-19 LAB — LIPID PANEL W/O CHOL/HDL RATIO
Cholesterol, Total: 200 mg/dL — ABNORMAL HIGH (ref 100–199)
HDL: 60 mg/dL (ref >39)
LDL Chol Calc (NIH): 121 mg/dL — ABNORMAL HIGH (ref 0–99)
Triglycerides: 109 mg/dL (ref 0–149)
VLDL Cholesterol Cal: 19 mg/dL (ref 5–40)

## 2024-11-19 LAB — MICROALBUMIN / CREATININE URINE RATIO
Creatinine, Urine: 88.3 mg/dL
Microalb/Creat Ratio: 8 mg/g{creat} (ref 0–29)
Microalbumin, Urine: 7 ug/mL

## 2024-11-19 LAB — TSH: TSH: 1.21 u[IU]/mL (ref 0.450–4.500)

## 2024-11-22 ENCOUNTER — Ambulatory Visit: Payer: Self-pay | Admitting: Internal Medicine

## 2024-11-22 LAB — URINE CULTURE

## 2024-11-22 NOTE — Progress Notes (Signed)
 I received urine culture result as a fax

## 2024-11-22 NOTE — Progress Notes (Signed)
 I called them and they said its finalized and they are going to fax it to us .

## 2024-11-23 ENCOUNTER — Other Ambulatory Visit: Payer: MEDICAID

## 2024-11-23 NOTE — Progress Notes (Unsigned)
 Blood pressure is 120/70  She is getting cavidlol 3.125 mg, Losartan  50 mg  The patient states that she got them today.

## 2024-11-24 MED ORDER — CARVEDILOL 6.25 MG PO TABS: ORAL_TABLET | ORAL | 11 refills | Status: AC

## 2024-11-24 NOTE — Progress Notes (Signed)
 The patient  is notified and instructed about the clean culture test.  She is give appointment at 10 am tomorrow

## 2024-11-24 NOTE — Progress Notes (Signed)
 Called the pharmacy ; she got only one refill of Atrovastatine on Nev 25th. Not any other refills.

## 2024-11-25 ENCOUNTER — Ambulatory Visit (INDEPENDENT_AMBULATORY_CARE_PROVIDER_SITE_OTHER): Payer: MEDICAID | Admitting: Clinical

## 2024-11-25 ENCOUNTER — Encounter: Payer: Self-pay | Admitting: Internal Medicine

## 2024-11-25 ENCOUNTER — Other Ambulatory Visit: Payer: Self-pay

## 2024-11-25 ENCOUNTER — Ambulatory Visit (INDEPENDENT_AMBULATORY_CARE_PROVIDER_SITE_OTHER): Payer: Self-pay | Admitting: Internal Medicine

## 2024-11-25 VITALS — BP 160/90 | HR 80 | Resp 22 | Ht 62.0 in | Wt 191.0 lb

## 2024-11-25 DIAGNOSIS — R42 Dizziness and giddiness: Secondary | ICD-10-CM

## 2024-11-25 DIAGNOSIS — F411 Generalized anxiety disorder: Secondary | ICD-10-CM

## 2024-11-25 DIAGNOSIS — F1990 Other psychoactive substance use, unspecified, uncomplicated: Secondary | ICD-10-CM

## 2024-11-25 DIAGNOSIS — R55 Syncope and collapse: Secondary | ICD-10-CM

## 2024-11-25 DIAGNOSIS — R108A3 Suprapubic tenderness: Secondary | ICD-10-CM

## 2024-11-25 DIAGNOSIS — R1031 Right lower quadrant pain: Secondary | ICD-10-CM

## 2024-11-25 DIAGNOSIS — J3089 Other allergic rhinitis: Secondary | ICD-10-CM

## 2024-11-25 DIAGNOSIS — I10 Essential (primary) hypertension: Secondary | ICD-10-CM

## 2024-11-25 DIAGNOSIS — Z716 Tobacco abuse counseling: Secondary | ICD-10-CM

## 2024-11-25 MED ORDER — MOMETASONE FUROATE 50 MCG/ACT NA SUSP: NASAL | 11 refills | Status: AC

## 2024-11-25 MED ORDER — LORATADINE 10 MG PO TABS: 10.0000 mg | ORAL_TABLET | Freq: Every day | ORAL | 11 refills | Status: AC

## 2024-11-25 NOTE — Progress Notes (Unsigned)
 THERAPIST PROGRESS NOTE Virtual Visit via Video Note  I connected with Cheyenne Fowler on 11/25/2024 at  4:00 PM EST by a video enabled telemedicine application and verified that I am speaking with the correct person using two identifiers.  Location: Patient: home Provider: office   I discussed the limitations of evaluation and management by telemedicine and the availability of in person appointments. The patient expressed understanding and agreed to proceed.   Follow Up Instructions: I discussed the assessment and treatment plan with the patient. The patient was provided an opportunity to ask questions and all were answered. The patient agreed with the plan and demonstrated an understanding of the instructions.   The patient was advised to call back or seek an in-person evaluation if the symptoms worsen or if the condition fails to improve as anticipated.   Session Time: 16 min  Participation Level: Active  Behavioral Response: CasualAlertAnxious  Type of Therapy: Individual Therapy  Treatment Goals addressed: client will engage in at least 80% of scheduled individual psychotherapy session  ProgressTowards Goals: Progressing  Interventions: CBT  Summary:  Cheyenne Fowler is a 58 y.o. female who presents for the scheduled appointment oriented x 5, appropriately dressed, and friendly.  Client denied hallucinations or delusions. Client reported on today she has been doing fairly okay with going to the distress.  Client reported the living situation with her daughter got stressful as they brought in a roommate to help cover the bills.  Client reported things that he did with the roommate and she and her husband went to stay with the niece for a day or 2.  Client reported that was a bad decision because the niece is dealing with some mental health issues that she was unaware of.  Client reported upon returning to the she went home with her daughter she has been trying to keep things  in order.  Client reported she has been having her own health issues that she has been trying to attend to.  Client reported the doctors are telling her that her blood pressure is too high even with her being on medications to help manage it.  Client reported she needs to better manage her stress.  Client reported she has been told she needs to stop smoking cigarettes and drinking beer.  Client reported highly unlikely that she will stop drinking a few beers that she has a day because it helps to relax her.  Client reported she is in physical therapy because of what her doctors found to be a neurological issue with while she was losing her balance and falling. Evidence of progress towards goal: Client reported 1 positive of keeping her doctors appointments as recommended for her health screenings.   Suicidal/Homicidal: Nowithout intent/plan  Therapist Response:  Therapist began the appointment asking the client how she has been doing since last seen. Therapist engaged using active listening and positive emotional support. Therapist used CBT to give the client time to express her thoughts and feelings on stress related to her health and family. Therapist used CBT to normalize her thoughts and feelings. Therapist used CBT to teach client about self-care to help minimize stress. Therapist used CBT ask the client to identify her progress with frequency of use with coping skills with continued practice in her daily activity.      Plan: Return again in 4 weeks.  Diagnosis: GAD  Collaboration of Care: Patient refused AEB none requested by the client.  Patient/Guardian was advised Release of Information  must be obtained prior to any record release in order to collaborate their care with an outside provider. Patient/Guardian was advised if they have not already done so to contact the registration department to sign all necessary forms in order for us  to release information regarding their care.    Consent: Patient/Guardian gives verbal consent for treatment and assignment of benefits for services provided during this visit. Patient/Guardian expressed understanding and agreed to proceed.   Arvella Massingale Y Prerna Harold, LCSW 11/25/2024

## 2024-11-25 NOTE — Progress Notes (Signed)
 Established Patient Office Visit  Subjective   Patient ID: Cheyenne Fowler, female    DOB: [birth year]  Age: 58 y.o. MRN: [id number]  Chief Complaint  Patient presents with   Flank Pain    Right flank pain    History from pt, EPIC notes.  1- RLQ Pain-  for 3-4 months . Worse with lying down, and turning over and when she is stretched out in bed.  Mixed history regarding the duration: On another occasion, duration was 1 month.  Sitting and walking does not affect it.. Occurs everyday. Neither worsening nor getting better. Sharp pain.  Radiates across the stomach. Not affected with turning.  Never had this before.  Onset: was laying down one day, turned over, and suddenly felt the pain and it has been there since.  Gabapentin , tylenol , ibuprofen  not helping.   No vaginal discharge. No burning when she pees.  No vaginal bleeding.  LMP 10 or more years ago.  NO injury to the area recently.  No N/V/D.  Eating Ok. Not worse with eating.   Appetite is OK. No diarrhea. Pt was complaining of this last visit. Dr. CHRISTELLA did a UA which was neg and which grew out an unusual organism that turned out to be skin contaminant upon further investigation.      Allergies  Allergen Reactions   Penicillins Hives   Sulfonamide Derivatives Hives    Current Meds  Medication Sig   atorvastatin  (LIPITOR ) 80 MG tablet Take 1 tablet (80 mg total) by mouth daily.   Blood Glucose Monitoring Suppl DEVI Dispense based on patient and insurance preference. Check blood glucose twice daily before meals and document   carvedilol  (COREG ) 6.25 MG tablet 1 tab by mouth twice daily   cetirizine  (ZYRTEC ) 10 MG tablet Take 1 tablet (10 mg total) by mouth daily.   DULoxetine  (CYMBALTA ) 20 MG capsule Take 1 capsule (20 mg total) by mouth daily.   gabapentin  (NEURONTIN ) 300 MG capsule Take 1 capsule (300 mg total) by mouth 3 (three) times daily.   Glucose Blood (BLOOD GLUCOSE TEST STRIPS) STRP Dispense based on patient and  insurance preference.Check blood glucose twice daily before meals and document   hydrOXYzine  (VISTARIL ) 50 MG capsule Take 1 capsule (50 mg total) by mouth every 6 (six) hours as needed. (Patient taking differently: Take 50 mg by mouth 2 times daily at 12 noon and 4 pm.)   isosorbide  mononitrate (IMDUR ) 30 MG 24 hr tablet Take 1 tablet (30 mg total) by mouth daily.   lamoTRIgine  (LAMICTAL ) 200 MG tablet Take 1 tablet (200 mg total) by mouth daily.   Lancet Device MISC Dispense based on patient and insurance preference. Check blood glucose twice daily before meals and document   Lancets MISC Dispense based on patient and insurance preference. Check blood glucose twice daily before meals and document   losartan  (COZAAR ) 50 MG tablet Take 1 tablet (50 mg total) by mouth daily.   metFORMIN  (GLUCOPHAGE -XR) 500 MG 24 hr tablet Take 1 tablet (500 mg total) by mouth daily with breakfast.   nitroGLYCERIN  (NITROSTAT ) 0.4 MG SL tablet Place 1 tablet (0.4 mg total) under the tongue every 5 (five) minutes as needed for chest pain.   pantoprazole  (PROTONIX ) 40 MG tablet Take 1 tablet (40 mg total) by mouth 2 (two) times daily.   QUEtiapine  (SEROQUEL ) 100 MG tablet Take 1 tablet (100 mg total) by mouth at bedtime.    Patient Active Problem List   Diagnosis Date Noted  Postmenopausal 10/24/2024   Acute transverse myelitis (HCC) 11/29/2023   HTN (hypertension) 11/29/2023   Right lumbar radiculopathy 06/02/2023   Plantar fasciitis of right foot 06/02/2023   Primary hypertension 01/23/2022   Acute esophagitis    Gastritis and gastroduodenitis    Hematemesis 01/16/2022   Type 2 diabetes mellitus (HCC) 01/15/2022   Hypertension associated with diabetes (HCC) 01/15/2022   Alcohol use 01/15/2022   Hyperlipidemia associated with type 2 diabetes mellitus (HCC) 01/15/2022   Extravasation of intravenous contrast medium 01/15/2022   Cocaine use disorder, mild, abuse (HCC) 06/15/2020   Bipolar I disorder, most  recent episode depressed (HCC) 06/14/2020   Menopausal syndrome 05/30/2020   Bipolar disorder (HCC)    Prediabetes    Tooth infection 08/27/2018   Hemoptysis 08/27/2018   Blood in stool 07/23/2018   Chest pain of uncertain etiology 07/13/2012   Depression 07/13/2012   Tobacco abuse 07/13/2012   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    Class: Acute   Polysubstance (excluding opioids) dependence (HCC) 05/22/2012    Class: Acute   Breast tenderness in female 03/18/2012   Health care maintenance 03/18/2012   DEPRESSION 08/17/2007    Past Surgical History:  Procedure Laterality Date   BIOPSY  01/17/2022   Procedure: BIOPSY;  Surgeon: Eda Iha, MD;  Location: [facility] ENDOSCOPY;  Service: Gastroenterology;;   CESAREAN SECTION  1982, 1983, 1990   ESOPHAGOGASTRODUODENOSCOPY (EGD) WITH PROPOFOL  N/A 01/17/2022   Procedure: ESOPHAGOGASTRODUODENOSCOPY (EGD) WITH PROPOFOL ;  Surgeon: Eda Iha, MD;  Location: [facility] ENDOSCOPY;  Service: Gastroenterology;  Laterality: N/A;   HERNIA REPAIR     Umbilical hernia in the 2nd grade   LEFT HEART CATH AND CORONARY ANGIOGRAPHY N/A 05/06/2023   Procedure: LEFT HEART CATH AND CORONARY ANGIOGRAPHY;  Surgeon: Wonda Sharper, MD;  Location: [facility] INVASIVE CV LAB;  Service: Cardiovascular;  Laterality: N/A;   TUBAL LIGATION  1990    Past abdomenal  CT's 2018 was neg Past abdomenal CT 2022 - aortic atherosclerosis, hepatic steatosis Last Pap 2023- negative; +trich vaginalis  Review of Systems  Constitutional:  Positive for weight loss (25 wt loss in 6 months; using portion control to lose wt.).  HENT:  Positive for congestion (Nasal stuffiness- h/ allergies. taking her meds. It is not working.  Allergic to animals, soaps, etc. Has not been avoiding these. 2 months of worseing sxs). Negative for sore throat.   Gastrointestinal:        See HPI  Genitourinary: Negative.   Musculoskeletal:  Negative for back pain.   Neurological:  Positive for dizziness and headaches (frontal headache due to allergies).       H/O falls times 3, loses balance sometimes, maybe shaky before it. For a year.  No falls for past 2 weeks.  Addressed by Dr. Adella during last visit and was thought to be related to DM.   +active cocaine use , alcohol 2 40 oz beers a week, marijuana, but no opiates recently.     Psychiatric/Behavioral:         Sees therapist here.    Objective:     BP (!) 160/90 (BP Location: Left Arm, Patient Position: Sitting, Cuff Size: Normal)   Pulse 80   Resp (!) 22   Ht 5' 2 (1.575 m)   Wt 191 lb (86.6 kg)   LMP 06/09/2018 (Exact Date) Comment: Bleeding one month ago.  BMI 34.93 kg/m    Physical Exam Vitals and nursing note reviewed.  Constitutional:  General: She is not in acute distress.    Appearance: Normal appearance. She is not ill-appearing, toxic-appearing or diaphoretic.     Comments: BMI high  HENT:     Right Ear: Tympanic membrane normal.     Left Ear: Tympanic membrane normal.     Nose: Congestion present.     Comments: No tenderness over sinuses    Mouth/Throat:     Pharynx: Oropharynx is clear. No oropharyngeal exudate or posterior oropharyngeal erythema.  Eyes:     General: No scleral icterus.    Conjunctiva/sclera: Conjunctivae normal.  Neck:     Comments: No lymphadenopathy Pulmonary:     Effort: Pulmonary effort is normal.  Abdominal:     Palpations: There is no mass.     Tenderness: There is abdominal tenderness (Moderate RLQ tenderness above pelvis area w/o rebound,some RUQ tenderness. Some superpubic tenderenss per Dr. CHRISTELLA.  Seems to be along the descending colon).  Musculoskeletal:     Comments: ?right lower abdomenal wall muscles tenderness No midline back and para-lumbar tenderness; can bend to touch the floor w/o pain  Skin:    Coloration: Skin is not jaundiced.     Findings: No bruising, lesion or rash.  Neurological:     General: No focal deficit  present.     Mental Status: She is alert and oriented to person, place, and time.     Comments: Can walk but when asked to walk on heels and tiptoes she cannot due to RLQ tenderness.   No pronator drift Neg rhomberg. No apparent focal deficits of RUE/RLE.SABRA Normal speech. No ptosis. Can open mouth w/o difficulty.  No facial droop  Psychiatric:        Mood and Affect: Mood normal.        Behavior: Behavior normal.        Judgment: Judgment normal.     Comments: Cheerful and interactive.     Assessment & Plan:  1. Suprapubic tenderness-    - Repeat UCx 2. Right lower quadrant abdominal pain Diff Dx: Abdomenal Muscle Strain vs Intrabdomenal pathology -Abdomenal/Pelvic CT w contrast -Physical Therapy - Dr. CHRISTELLA did not feel a pelvic was needed at this time as sxs are up higher than the pelvis but if the above is negative, might bring pt back and do a Pelvic.   3. Primary hypertension -Pt advised to stop cocaine as it causes HTN and MI. Pt expressed understanding. -RTC 2 days for re-check  4. Dizziness and Falls for awhile- -Pt says a Neurologist sees her for it.  Also this was evaluated during last clinic visit. -Stable for now; Not really addressed during this visit.  -Pt re-asked to check her blood sugars when dizzy by Dr. CHRISTELLA -I advised that she should stop all drugs and alcohol as these might be contributing or causing. Pt said she understood. These are also confusing the picture, making it difficult to get an accurate evaluation.    5. Tobacco abuse counseling- By Dr. CHRISTELLA. Pt asked to use Nicotine  lozanges PRN; smokes 3-4 cigarettes a day  6. Drug use disorder- Counseled against use of drugs by Dr. CHRISTELLA  7. Environmental and seasonal allergies-  -Stop Zyrtec  -Nasonyx nasal spray -Claritin  -Stop perfumed soaps, handling pets, and other things that worsen her allergies  8. Covid due- Pt advised to get it at the pharmacy as it is not available in clinic at this time.  9- Flu   UTD.  RTC pending CT and for new concerns and  regular appts.   Caron Kaiser, MD

## 2024-11-26 ENCOUNTER — Other Ambulatory Visit: Payer: MEDICAID

## 2024-11-27 LAB — URINE CULTURE: Organism ID, Bacteria: NO GROWTH

## 2024-11-29 ENCOUNTER — Encounter: Payer: Self-pay | Admitting: Neurology

## 2024-12-03 ENCOUNTER — Inpatient Hospital Stay: Admission: RE | Admit: 2024-12-03 | Payer: MEDICAID

## 2024-12-09 ENCOUNTER — Ambulatory Visit (HOSPITAL_COMMUNITY): Payer: MEDICAID | Admitting: Clinical

## 2024-12-09 ENCOUNTER — Encounter (HOSPITAL_COMMUNITY): Payer: Self-pay

## 2024-12-20 ENCOUNTER — Telehealth: Payer: Self-pay

## 2024-12-20 NOTE — Telephone Encounter (Signed)
 The patient called today and stated that she recived a call to for CT appointment but later she was contacted that it was Canceled because her insurance does not cover it.  The patient is asking what can I do for it? Can somebody help me>

## 2024-12-21 NOTE — Telephone Encounter (Signed)
 I call her insurance to figure out the name of an imaging  agency that is covered under her insurance, after dialing many   numbers, someone was able  to help me but they needed the to talk to patient first.  Today I told the patient to call her insurance and choose an imaging center so we can send CT referral to

## 2024-12-24 ENCOUNTER — Other Ambulatory Visit: Payer: MEDICAID

## 2024-12-29 ENCOUNTER — Telehealth: Payer: Self-pay

## 2024-12-29 ENCOUNTER — Other Ambulatory Visit: Payer: Self-pay

## 2024-12-31 ENCOUNTER — Ambulatory Visit: Payer: MEDICAID

## 2024-12-31 VITALS — BP 130/60 | HR 73

## 2024-12-31 DIAGNOSIS — Z0130 Encounter for examination of blood pressure without abnormal findings: Secondary | ICD-10-CM

## 2024-12-31 NOTE — Progress Notes (Unsigned)
 Pb 130/60 pulse 73   Patient is taking carvedilol  6.25mg  BID and Losartan  50mg  daily  Per Dr. Adella  Call and ask about her CT if she was called or not.

## 2025-01-04 ENCOUNTER — Other Ambulatory Visit: Payer: MEDICAID | Admitting: Psychology

## 2025-01-04 NOTE — Progress Notes (Unsigned)
 " OUTPATIENT PHYSICAL THERAPY FEMALE PELVIC EVALUATION   [name] MRN: [id number] DOB:Dec 21, 1966, 59 y.o., female Today's Date: 01/04/2025  END OF SESSION:   Past Medical History:  Diagnosis Date   Anemia    Bipolar disorder (HCC)    Diabetes mellitus (HCC) 2022   type 2   Environmental and seasonal allergies 1968   GERD (gastroesophageal reflux disease)    Heart murmur    Hypertension    Upper GI bleed 01/15/2022   Past Surgical History:  Procedure Laterality Date   BIOPSY  01/17/2022   Procedure: BIOPSY;  Surgeon: Eda Iha, MD;  Location: [facility] ENDOSCOPY;  Service: Gastroenterology;;   CESAREAN SECTION  1982, 1983, 1990   ESOPHAGOGASTRODUODENOSCOPY (EGD) WITH PROPOFOL  N/A 01/17/2022   Procedure: ESOPHAGOGASTRODUODENOSCOPY (EGD) WITH PROPOFOL ;  Surgeon: Eda Iha, MD;  Location: [facility] ENDOSCOPY;  Service: Gastroenterology;  Laterality: N/A;   HERNIA REPAIR     Umbilical hernia in the 2nd grade   LEFT HEART CATH AND CORONARY ANGIOGRAPHY N/A 05/06/2023   Procedure: LEFT HEART CATH AND CORONARY ANGIOGRAPHY;  Surgeon: Wonda Sharper, MD;  Location: [facility] INVASIVE CV LAB;  Service: Cardiovascular;  Laterality: N/A;   TUBAL LIGATION  1990   Patient Active Problem List   Diagnosis Date Noted   Postmenopausal 10/24/2024   Acute transverse myelitis (HCC) 11/29/2023   HTN (hypertension) 11/29/2023   Right lumbar radiculopathy 06/02/2023   Plantar fasciitis of right foot 06/02/2023   Primary hypertension 01/23/2022   Acute esophagitis    Gastritis and gastroduodenitis    Hematemesis 01/16/2022   Type 2 diabetes mellitus (HCC) 01/15/2022   Hypertension associated with diabetes (HCC) 01/15/2022   Alcohol use 01/15/2022   Hyperlipidemia associated with type 2 diabetes mellitus (HCC) 01/15/2022   Extravasation of intravenous contrast medium 01/15/2022   Cocaine use disorder, mild, abuse (HCC) 06/15/2020   Bipolar I disorder, most recent episode depressed  (HCC) 06/14/2020   Menopausal syndrome 05/30/2020   Bipolar disorder (HCC)    Prediabetes    Tooth infection 08/27/2018   Hemoptysis 08/27/2018   Blood in stool 07/23/2018   Chest pain of uncertain etiology 07/13/2012   Depression 07/13/2012   Tobacco abuse 07/13/2012   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  Rationale for Evaluation and Treatment: {HABREHAB:27488}  ONSET DATE: ***  SUBJECTIVE:  SUBJECTIVE STATEMENT: Right flank, lower abdominal pain--seems to be of muscle wall at pelvic rim/asis anteriorly.  Fluid intake:   FUNCTIONAL LIMITATIONS: ***  PERTINENT HISTORY:  Medications for current condition: *** Surgeries: Cesarean section x 3; Hernia repair Other: Diabetes; Bipolar Sexual abuse: {Yes/No:304960894}  PAIN:  Are you having pain? {yes/no:20286} NPRS scale: ***/10 Pain location: {pelvic pain location:27098}  Pain type: {type:313116} Pain description: {PAIN DESCRIPTION:21022940}   Aggravating factors: *** Relieving factors: ***  PRECAUTIONS: None  RED FLAGS: {PT Red Flags:29287}   WEIGHT BEARING RESTRICTIONS: No  FALLS:  Has patient fallen in last 6 months? {fallsyesno:27318}  OCCUPATION: ***  ACTIVITY LEVEL : ***  PLOF: {PLOF:24004}  PATIENT GOALS: ***   BOWEL MOVEMENT: Pain with bowel movement: {yes/no:20286} Type of bowel  movement:{PT BM type:27100} Fully empty rectum: {No/Yes:304960894} Leakage: {Yes/No:304960894}                                                  Caused by: *** Bowel urgency: *** Pads: {Yes/No:304960894} Fiber supplement/laxative {YES/NO AS:20300}  URINATION: Pain with urination: {yes/no:20286} Fully empty bladder: {Yes/No:304960894}***                                         Post-void dribble: {YES/NO AS:20300} Stream: {PT urination:27102} Urgency: {YES/NO AS:20300} Frequency:during the day ***                                                        Nocturia: {Yes/No:304960894}***   Leakage: {PT leakage:27103} Pads/briefs: {Yes/No:304960894}  INTERCOURSE:  Ability to have vaginal penetration {YES/NO:21197} Pain with intercourse: {pain with intercourse PA:27099} Dryness: {YES/NO AS:20300} Climax: *** Marinoff Scale: ***/3 Lubricant:  PREGNANCY: Vaginal deliveries *** Tearing {Yes***/No:304960894} Episiotomy {YES/NO AS:20300} C-section deliveries *** Currently pregnant {Yes***/No:304960894}  PROLAPSE: {PT prolapse:27101}   OBJECTIVE:  Note: Objective measures were completed at Evaluation unless otherwise noted.  DIAGNOSTIC FINDINGS:  Post-void residual: Voiding Cystourethrogram (VCUG):  Ultrasound: ***  PATIENT SURVEYS:  {rehab surveys:24030}  PFIQ-7: *** UIQ-7 *** CRAIG -7 *** POPIQ-7 *** Female Sexual Function Index (FSFI) Questionnaire ***  COGNITION: Overall cognitive status: {cognition:24006}     SENSATION: Light touch: {intact/deficits:24005}  LUMBAR SPECIAL TESTS:  {lumbar special test:25242}  FUNCTIONAL TESTS:  {Functional tests:24029} Single leg stance:  Rt:  Lt: Sit-up test: Squat: Bed mobility:  GAIT: Assistive device utilized: {Assistive devices:23999} Comments: ***  POSTURE: {posture:25561}   LUMBARAROM/PROM:  A/PROM A/PROM  Eval (% available)  Flexion   Extension   Right lateral flexion   Left lateral flexion   Right  rotation   Left rotation    (Blank rows = not tested)  LOWER EXTREMITY ROM:  {AROM/PROM:27142} ROM Right eval Left eval  Hip flexion    Hip extension    Hip abduction    Hip adduction    Hip internal rotation    Hip external rotation    Knee flexion    Knee extension    Ankle dorsiflexion    Ankle plantarflexion    Ankle inversion    Ankle eversion     (Blank rows = not tested)  LOWER  EXTREMITY MMT:  MMT Right eval Left eval  Hip flexion    Hip extension    Hip abduction    Hip adduction    Hip internal rotation    Hip external rotation    Knee flexion    Knee extension    Ankle dorsiflexion    Ankle plantarflexion    Ankle inversion    Ankle eversion     (Blank rows = not tested) PALPATION:  General: ***  Pelvic Alignment: ***  Abdominal: ***  Diastasis: {Yes/No:304960894}*** Distortion: {YES/NO AS:20300}  Breathing: *** Scar tissue: {Yes/No:304960894}*** Active Straight Leg Raise: ***                External Perineal Exam: ***                             Internal Pelvic Floor: ***  Patient confirms identification and approves PT to assess internal pelvic floor and treatment {yes/no:20286} All internal or external pelvic floor assessments and/or treatments are completed with proper hand hygiene and gloves hands. If needed gloves are changed with hand hygiene during patient care time.  PELVIC MMT:   MMT eval  Vaginal   Internal Anal Sphincter   External Anal Sphincter   Puborectalis   (Blank rows = not tested)        TONE: ***  PROLAPSE: ***  TODAY'S TREATMENT:                                                                                                                              DATE: ***  EVAL ***   PATIENT EDUCATION:  Education details: *** Person educated: {Person educated:25204} Education method: {Education Method:25205} Education comprehension: {Education Comprehension:25206}  HOME EXERCISE  PROGRAM: ***  ASSESSMENT:  CLINICAL IMPRESSION: Patient is a *** y.o. *** who was seen today for physical therapy evaluation and treatment for ***.   OBJECTIVE IMPAIRMENTS: {opptimpairments:25111}.   ACTIVITY LIMITATIONS: {activitylimitations:27494}  PARTICIPATION LIMITATIONS: {participationrestrictions:25113}  PERSONAL FACTORS: {Personal factors:25162} are also affecting patient's functional outcome.   REHAB POTENTIAL: {rehabpotential:25112}  CLINICAL DECISION MAKING: {clinical decision making:25114}  EVALUATION COMPLEXITY: {Evaluation complexity:25115}   GOALS: Goals reviewed with patient? {yes/no:20286}  SHORT TERM GOALS: Target date: ***  *** Baseline: Goal status: INITIAL  2.  *** Baseline:  Goal status: INITIAL  3.  *** Baseline:  Goal status: INITIAL  4.  *** Baseline:  Goal status: INITIAL  5.  *** Baseline:  Goal status: INITIAL  6.  *** Baseline:  Goal status: INITIAL  LONG TERM GOALS: Target date: ***  *** Baseline:  Goal status: INITIAL  2.  *** Baseline:  Goal status: INITIAL  3.  *** Baseline:  Goal status: INITIAL  4.  *** Baseline:  Goal status: INITIAL  5.  *** Baseline:  Goal status: INITIAL  6.  *** Baseline:  Goal status: INITIAL  PLAN:  PT FREQUENCY: {rehab frequency:25116}  PT  DURATION: {rehab duration:25117}  PLANNED INTERVENTIONS: {rehab planned interventions:25118::97110-Therapeutic exercises,97530- Therapeutic [phone]- Neuromuscular re-education,97535- Self Rjmz,02859- Manual therapy,Patient/Family education}  PLAN FOR NEXT SESSION: ***   Norrine Ballester, PT 01/04/2025, 10:58 AM  "

## 2025-01-05 ENCOUNTER — Other Ambulatory Visit: Payer: Self-pay

## 2025-01-05 ENCOUNTER — Ambulatory Visit: Payer: MEDICAID | Admitting: Physical Therapy

## 2025-01-05 ENCOUNTER — Ambulatory Visit: Payer: MEDICAID | Attending: Internal Medicine | Admitting: Physical Therapy

## 2025-01-05 ENCOUNTER — Encounter: Payer: Self-pay | Admitting: Physical Therapy

## 2025-01-05 DIAGNOSIS — R1030 Lower abdominal pain, unspecified: Secondary | ICD-10-CM | POA: Diagnosis present

## 2025-01-05 DIAGNOSIS — R1031 Right lower quadrant pain: Secondary | ICD-10-CM | POA: Diagnosis not present

## 2025-01-05 DIAGNOSIS — M5459 Other low back pain: Secondary | ICD-10-CM | POA: Insufficient documentation

## 2025-01-05 DIAGNOSIS — M6281 Muscle weakness (generalized): Secondary | ICD-10-CM | POA: Diagnosis present

## 2025-01-05 DIAGNOSIS — R2689 Other abnormalities of gait and mobility: Secondary | ICD-10-CM | POA: Diagnosis present

## 2025-01-11 ENCOUNTER — Ambulatory Visit: Payer: MEDICAID

## 2025-01-12 ENCOUNTER — Encounter (HOSPITAL_COMMUNITY): Payer: Self-pay | Admitting: Psychiatry

## 2025-01-12 ENCOUNTER — Telehealth (HOSPITAL_COMMUNITY): Payer: MEDICAID | Admitting: Psychiatry

## 2025-01-12 DIAGNOSIS — F411 Generalized anxiety disorder: Secondary | ICD-10-CM | POA: Diagnosis not present

## 2025-01-12 DIAGNOSIS — F3130 Bipolar disorder, current episode depressed, mild or moderate severity, unspecified: Secondary | ICD-10-CM | POA: Diagnosis not present

## 2025-01-12 MED ORDER — LAMOTRIGINE 200 MG PO TABS: 200.0000 mg | ORAL_TABLET | Freq: Every day | ORAL | 3 refills | Status: AC

## 2025-01-12 MED ORDER — HYDROXYZINE PAMOATE 50 MG PO CAPS: 50.0000 mg | ORAL_CAPSULE | Freq: Four times a day (QID) | ORAL | 3 refills | Status: AC | PRN

## 2025-01-12 MED ORDER — DULOXETINE HCL 20 MG PO CPEP: 20.0000 mg | ORAL_CAPSULE | Freq: Every day | ORAL | 3 refills | Status: AC

## 2025-01-12 MED ORDER — QUETIAPINE FUMARATE 100 MG PO TABS: 100.0000 mg | ORAL_TABLET | Freq: Every day | ORAL | 3 refills | Status: AC

## 2025-01-12 NOTE — Progress Notes (Signed)
 BH MD/PA/NP OP Progress Note Virtual Visit via Telephone Note  I connected with Cheyenne Fowler on 01/12/25 at  2:30 PM EST by telephone and verified that I am speaking with the correct person using two identifiers.  Location: Patient: home Provider: Clinic   I discussed the limitations, risks, security and privacy concerns of performing an evaluation and management service by telephone and the availability of in person appointments. I also discussed with the patient that there may be a patient responsible charge related to this service. The patient expressed understanding and agreed to proceed.   I provided 20 minutes of non-face-to-face time during this encounter.         01/12/2025 12:47 PM Cheyenne Fowler  MRN:  [id number]  Chief Complaint:  I have been having issues with balance    HPI: 59 year-old female seen today for follow-up psychiatric evaluation. She has a psychiatric history of polysubstance use (cocaine, marijuana, and alcohol), depression, and bipolar disorder. She was being managed on Cymbalta  20 mg  daily, Lamictal  200 mg daily, Seroquel  100 mg nightly, and Vistaril  50mg  4 times daily as needed. She reports that her medications are effective in managing her psychiatric conditions.  Today informed clinical research associate that she has been losing her balance. She notes that it is difficult for her to walk and drive. Patient notes that she is concerned about her medicaid as it has not paid for her CT scans or other procedures.  Patient notes that the above exacerbates her anxiety and depression.  Today provider conducted a GAD-7 and patient scored a 21, at her last visit she scored an 18 .  Provider also conducted PHQ-9 of he scored 15, at her last visit she scored a 13.  She notes that her sleep and appetite are adequate.  Today she denies SI/HI/AH, mania, paranoia.   Provider asked patient if she continued to use alcohol and cocaine. She reports that she does not.   Since her last  visit she notes that she was able to spend time with er great grandchildren which she reports brought her joy. She also notes that her husbands health has improved.    Patient notes that she is able to cope with stressors. At this time she request that her medications not be adjusted. No medications changes made today. Patient agreeable to continue medications as prescribed.  No other concerns at this time. Visit Diagnosis:    ICD-10-CM   1. Bipolar I disorder, most recent episode depressed (HCC)  F31.30 QUEtiapine  (SEROQUEL ) 100 MG tablet    DULoxetine  (CYMBALTA ) 20 MG capsule    lamoTRIgine  (LAMICTAL ) 200 MG tablet    hydrOXYzine  (VISTARIL ) 50 MG capsule    2. Generalized anxiety disorder  F41.1 DULoxetine  (CYMBALTA ) 20 MG capsule            Past Psychiatric History: Depression, substance induced mood disorder, Bipolar disorder,  Past Medical History:  Past Medical History:  Diagnosis Date   Anemia    Bipolar disorder (HCC)    Diabetes mellitus (HCC) 2022   type 2   Environmental and seasonal allergies 1968   GERD (gastroesophageal reflux disease)    Heart murmur    Hypertension    Upper GI bleed 01/15/2022    Past Surgical History:  Procedure Laterality Date   BIOPSY  01/17/2022   Procedure: BIOPSY;  Surgeon: Eda Iha, MD;  Location: [facility] ENDOSCOPY;  Service: Gastroenterology;;   CESAREAN SECTION  1982, 1983, 1990   ESOPHAGOGASTRODUODENOSCOPY (EGD) WITH PROPOFOL   N/A 01/17/2022   Procedure: ESOPHAGOGASTRODUODENOSCOPY (EGD) WITH PROPOFOL ;  Surgeon: Eda Iha, MD;  Location: [facility] ENDOSCOPY;  Service: Gastroenterology;  Laterality: N/A;   HERNIA REPAIR     Umbilical hernia in the 2nd grade   LEFT HEART CATH AND CORONARY ANGIOGRAPHY N/A 05/06/2023   Procedure: LEFT HEART CATH AND CORONARY ANGIOGRAPHY;  Surgeon: Wonda Sharper, MD;  Location: [facility] INVASIVE CV LAB;  Service: Cardiovascular;  Laterality: N/A;   TUBAL LIGATION  1990    Family Psychiatric History:  Brother schizophrenia and bipolar, sister bipolar disorder  Family History:  Family History  Problem Relation Age of Onset   Diabetes Mother    Cancer Mother        liver--not clear if this was the primary   COPD Mother    Bipolar disorder Sister    Diabetes Sister    Schizophrenia Brother    Bipolar disorder Brother    Cancer Brother        Not sure of primary   Cancer Maternal Aunt        Breast cancer   Breast cancer Maternal Aunt        Cause of death   Cancer Maternal Aunt        pancreatic   Cancer Maternal Grandfather        throat   Colon cancer Neg Hx    Colon polyps Neg Hx    Esophageal cancer Neg Hx    Stomach cancer Neg Hx    Rectal cancer Neg Hx     Social History:  Social History   Socioeconomic History   Marital status: Married    Spouse name: Vinie Kerns   Number of children: 3   Years of education: Not on file   Highest education level: GED or equivalent  Occupational History   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  Using crack cocaine 3 to 4 times monthly.   Sexual activity: Yes    Birth control/protection: Surgical  Other Topics Concern   Not on file  Social History Narrative   Currently living with husband, daughter, daughter's 2 children and a roommate.    Social Drivers of Health   Tobacco Use: High Risk (01/12/2025)   Patient History    Smoking Tobacco Use: Every Day    Smokeless Tobacco Use: Never    Passive Exposure: Not on file  Financial Resource Strain: Low Risk (01/23/2022)   Overall  Financial Resource Strain (CARDIA)    Difficulty of Paying Living Expenses: Not very hard  Food Insecurity: No Food Insecurity (11/29/2023)   Hunger Vital Sign    Worried About Running Out of Food in the Last Year: Never true    Ran Out of Food in the Last Year: Never true  Transportation Needs: No Transportation Needs (11/29/2023)   PRAPARE - Administrator, Civil Service (Medical): No    Lack of Transportation (Non-Medical): No  Physical Activity: Not on file  Stress: Not on file  Social Connections: Not on file  Depression (PHQ2-9): High Risk (01/12/2025)   Depression (PHQ2-9)    PHQ-2 Score: 15  Alcohol Screen: Not on file  Housing: Low Risk (11/29/2023)   Housing    Last Housing Risk Score: 0  Utilities: Not At Risk (11/29/2023)   AHC Utilities    Threatened with loss of utilities: No  Health Literacy: Not on file    Allergies:  Allergies  Allergen Reactions   Penicillins Hives   Sulfonamide Derivatives Hives    Metabolic Disorder Labs: Lab Results  Component Value Date   HGBA1C 6.1 (H) 09/03/2024   MPG 128.37 11/29/2023   MPG 119.76 07/03/2018   No results found for: PROLACTIN Lab Results  Component Value Date   CHOL 200 (H) 11/16/2024   TRIG 109 11/16/2024   HDL 60 11/16/2024   CHOLHDL 4.3 09/03/2024   VLDL 31 07/03/2018   LDLCALC 121 (H) 11/16/2024   LDLCALC 149 (H) 09/03/2024   Lab Results  Component Value Date   TSH 1.210 11/16/2024   TSH 0.828 05/23/2012    Therapeutic Level Labs: No results found for: LITHIUM No results found for: VALPROATE No results found for: CBMZ  Current Medications: Current Outpatient Medications  Medication Sig Dispense Refill   atorvastatin  (LIPITOR ) 80 MG tablet Take 1 tablet (80 mg total) by mouth daily. 90 tablet 3   Blood Glucose Monitoring Suppl DEVI Dispense based on patient and insurance preference. Check blood glucose twice daily before meals and document 1 each 0   Blood Pressure Monitoring  (CLEVER CHOICE BP MONITOR/ARM) DEVI Use to check blood pressure 3 times weekly and as needed (Patient not taking: Reported on 11/25/2024) 1 each 0   carvedilol  (COREG ) 6.25 MG tablet 1 tab by mouth twice daily 60 tablet 11   DULoxetine  (CYMBALTA ) 20 MG capsule Take 1 capsule (20 mg total) by mouth daily. 30 capsule 3   gabapentin  (NEURONTIN ) 300 MG capsule Take 1 capsule (300 mg total) by mouth 3 (three) times daily. 270 capsule 3   Glucose Blood (BLOOD GLUCOSE TEST STRIPS) STRP Dispense based on patient and insurance preference.Check blood glucose twice daily before meals and document 100 strip 6   hydrOXYzine  (VISTARIL ) 50 MG capsule Take 1 capsule (50 mg total) by mouth every 6 (six) hours as needed. 120 capsule 3   isosorbide  mononitrate (IMDUR ) 30 MG 24 hr tablet Take 1 tablet (30 mg total) by mouth daily. 90 tablet 0   lamoTRIgine  (LAMICTAL ) 200 MG tablet Take 1 tablet (200 mg total) by mouth daily. 30 tablet 3   Lancet Device MISC Dispense based on patient and insurance preference. Check blood glucose twice daily before meals and document 1 each 0   Lancets Misc. MISC Check blood glucose twice daily before meals (Patient not taking: Reported on 07/27/2024) 100 each 11   Lancets MISC Dispense based on patient and insurance preference. Check blood glucose twice daily before meals and document 100 each 6   loratadine  (CLARITIN ) 10 MG tablet Take 1 tablet (10 mg total) by mouth daily. 30 tablet 11   losartan  (COZAAR ) 50 MG tablet Take 1 tablet (50 mg total) by mouth daily. 90 tablet 3   metFORMIN  (GLUCOPHAGE -XR) 500 MG 24 hr tablet Take 1 tablet (500 mg total) by mouth daily with breakfast. 180 tablet 3   mometasone  (NASONEX  24HR) 50 MCG/ACT nasal spray 2 sprays each nostril daily. 17 g 11   nitroGLYCERIN  (NITROSTAT ) 0.4 MG SL tablet Place 1 tablet (0.4 mg total)  under the tongue every 5 (five) minutes as needed for chest pain. 25 tablet 1   pantoprazole  (PROTONIX ) 40 MG tablet Take 1 tablet (40 mg  total) by mouth 2 (two) times daily. 180 tablet 3   QUEtiapine  (SEROQUEL ) 100 MG tablet Take 1 tablet (100 mg total) by mouth at bedtime. 30 tablet 3   No current facility-administered medications for this visit.     Musculoskeletal: Strength & Muscle Tone: within normal limits and telehealth visit Gait & Station: normal, telehealth visit Patient leans: N/A  Psychiatric Specialty Exam: Review of Systems  Last menstrual period 06/09/2018.There is no height or weight on file to calculate BMI.  General Appearance: Well Groomed  Eye Contact:  Good  Speech:  Clear and Coherent and Normal Rate  Volume:  Normal  Mood:  Anxious and Depressed  Affect:  Congruent  Thought Process:  Coherent, Goal Directed, and Linear  Orientation:  Full (Time, Place, and Person)  Thought Content: WDL and Logical   Suicidal Thoughts:  No  Homicidal Thoughts:  No  Memory:  Immediate;   Good Recent;   Good Remote;   Good  Judgement:  Good  Insight:  Good  Psychomotor Activity:  Normal  Concentration:  Concentration: Good and Attention Span: Good  Recall:  Good  Fund of Knowledge: Good  Language: Good  Akathisia:  No  Handed:  Right  AIMS (if indicated): not done  Assets:  Communication Skills Desire for Improvement Housing Leisure Time Social Support  ADL's:  Intact  Cognition: WNL  Sleep:  Fair   Screenings: CAGE-AID    Flowsheet Row ED to Hosp-Admission (Discharged) from 11/28/2023 in [facility]  CAGE-AID Score 0   GAD-7    Flowsheet Row Video Visit from 01/12/2025 in [facility] Video Visit from 10/29/2024 in [facility] Video Visit from 03/18/2024 in [facility] Clinical Support from 10/07/2023 in [facility] Clinical Support from 07/08/2023 in [facility]  Total GAD-7 Score 20 18 12 17 20    PHQ2-9    Flowsheet Row Video  Visit from [facility]12/[facility] Video Visit from 10/29/2024 in [facility] Video Visit from 03/18/2024 in [facility] Clinical Support from 10/07/2023 in [facility] Clinical Support from 07/08/2023 in [facility]  PHQ-2 Total Score 2 3 5 3 2   PHQ-9 Total Score 15 13 18 12 7    Flowsheet Row ED from 10/04/2024 in [facility] Emergency Department at [facility] ED to Hosp-Admission (Discharged) from 11/28/2023 in [facility] ED from 11/27/2023 in [facility] Emergency Department at [facility]  C-SSRS RISK CATEGORY No Risk No Risk No Risk     Assessment and Plan: Patient reports that her anxiety, depression, and sleep continue to be problematic due to life stressors.  At this time she request that her medications not be adjusted.  Patient does note that she copes with cocaine, alcohol, and tobacco.  She is not interested in being substance use therapy at this time.  No medication changes made today.  Patient agreeable to taking medication as prescribed.  1. Bipolar I disorder, most recent episode depressed (HCC)  Continue- QUEtiapine  (SEROQUEL ) 100 MG tablet; Take 1 tablet (100 mg total) by mouth at bedtime.  Dispense: 30 tablet; Refill: 3 - DULoxetine  (CYMBALTA ) 20 MG capsule; Take  1 capsule (20 mg total) by mouth daily.  Dispense: 30 capsule; Refill: 3 Continue- lamoTRIgine  (LAMICTAL ) 200 MG tablet; Take 1 tablet (200 mg total) by mouth daily.  Dispense: 30 tablet; Refill: 3 Continue- hydrOXYzine  (VISTARIL ) 50 MG capsule; Take 1 capsule (50 mg total) by mouth every 6 (six) hours as needed.  Dispense: 120 capsule; Refill: 3  2. Generalized anxiety disorder  Continue- DULoxetine  (CYMBALTA ) 20 MG capsule; Take 1 capsule (20 mg total) by mouth daily.  Dispense: 30 capsule; Refill: 3     Follow-up in 2 months Zane FORBES Bach, NP 01/12/2025, 12:47 PM

## 2025-01-17 ENCOUNTER — Ambulatory Visit: Payer: MEDICAID | Admitting: Rehabilitative and Restorative Service Providers"

## 2025-01-19 ENCOUNTER — Ambulatory Visit: Payer: Self-pay | Admitting: Internal Medicine

## 2025-01-19 ENCOUNTER — Ambulatory Visit: Payer: MEDICAID

## 2025-01-19 NOTE — Progress Notes (Signed)
 See Dr. Telford note below Patient seen with her We are repeating her urine CC and culture today also as grew Kocuria rosea from last urine, which after investigation, is likely a contaminant.   Agree with plan

## 2025-01-20 ENCOUNTER — Encounter (HOSPITAL_COMMUNITY): Payer: Self-pay

## 2025-01-20 ENCOUNTER — Ambulatory Visit (HOSPITAL_COMMUNITY): Payer: MEDICAID | Admitting: Clinical

## 2025-01-24 ENCOUNTER — Ambulatory Visit: Payer: MEDICAID | Admitting: Rehabilitative and Restorative Service Providers"

## 2025-01-26 ENCOUNTER — Ambulatory Visit: Payer: MEDICAID

## 2025-01-26 DIAGNOSIS — R1030 Lower abdominal pain, unspecified: Secondary | ICD-10-CM

## 2025-01-26 DIAGNOSIS — R293 Abnormal posture: Secondary | ICD-10-CM

## 2025-01-26 DIAGNOSIS — R262 Difficulty in walking, not elsewhere classified: Secondary | ICD-10-CM

## 2025-01-26 DIAGNOSIS — R29818 Other symptoms and signs involving the nervous system: Secondary | ICD-10-CM

## 2025-01-26 DIAGNOSIS — R2689 Other abnormalities of gait and mobility: Secondary | ICD-10-CM

## 2025-01-26 DIAGNOSIS — M5459 Other low back pain: Secondary | ICD-10-CM

## 2025-01-26 DIAGNOSIS — R278 Other lack of coordination: Secondary | ICD-10-CM

## 2025-01-26 DIAGNOSIS — M6281 Muscle weakness (generalized): Secondary | ICD-10-CM

## 2025-01-26 DIAGNOSIS — R252 Cramp and spasm: Secondary | ICD-10-CM

## 2025-01-26 DIAGNOSIS — R208 Other disturbances of skin sensation: Secondary | ICD-10-CM

## 2025-01-26 NOTE — Therapy (Signed)
 Expand All Collapse All  OUTPATIENT PHYSICAL THERAPY FEMALE PELVIC TREATMENT NOTE     [name] MRN: [id number] DOB:08-18-1966, 59 y.o., female Today's Date: 01/04/2025   END OF SESSION:      Outpatient Rehab from 01/05/2025 in [facility]   01/05/2025    1423  PT Visits / Re-Eval   Visit Number 1  Number of Visits --  Date for Recertification 07/05/2025  Authorization   Authorization Type [employer]  Authorization Time Period --  Authorization - Visit Number --  Authorization - Number of Visits --  Progress Note Due on Visit --  PT Time Calculation   PT Start Time 1419  PT Stop Time 1445  PT Time Calculation (min) 26 min  PT - End of Session   Equipment Utilized During Treatment --  Activity Tolerance Patient tolerated treatment well  Behavior During Therapy [facility] for tasks assessed/performed       Past Medical History:  Diagnosis Date   Anemia     Bipolar disorder (HCC)     Diabetes mellitus (HCC) 2022    type 2   Environmental and seasonal allergies 1968   GERD (gastroesophageal reflux disease)     Heart murmur     Hypertension     Upper GI bleed 01/15/2022       Channing Pereyra, PT 01/26/25 5:19 PM       Past Surgical History:  Procedure Laterality Date   BIOPSY   01/17/2022    Procedure: BIOPSY;  Surgeon: Eda Iha, MD;  Location: [facility] ENDOSCOPY;  Service: Gastroenterology;;   CESAREAN SECTION   1982, 1983, 1990   ESOPHAGOGASTRODUODENOSCOPY (EGD) WITH PROPOFOL  N/A 01/17/2022    Procedure: ESOPHAGOGASTRODUODENOSCOPY (EGD) WITH PROPOFOL ;  Surgeon: Eda Iha, MD;  Location: [facility] ENDOSCOPY;  Service: Gastroenterology;  Laterality: N/A;   HERNIA REPAIR        Umbilical hernia in the 2nd grade   LEFT HEART CATH AND CORONARY ANGIOGRAPHY N/A 05/06/2023    Procedure: LEFT HEART CATH AND CORONARY ANGIOGRAPHY;  Surgeon: Wonda Sharper, MD;  Location: [facility] INVASIVE CV LAB;  Service: Cardiovascular;  Laterality: N/A;    TUBAL LIGATION   1990             Patient Active Problem List    Diagnosis Date Noted   Postmenopausal 10/24/2024   Acute transverse myelitis (HCC) 11/29/2023   HTN (hypertension) 11/29/2023   Right lumbar radiculopathy 06/02/2023   Plantar fasciitis of right foot 06/02/2023   Primary hypertension 01/23/2022   Acute esophagitis     Gastritis and gastroduodenitis     Hematemesis 01/16/2022   Type 2 diabetes mellitus (HCC) 01/15/2022   Hypertension associated with diabetes (HCC) 01/15/2022   Alcohol use 01/15/2022   Hyperlipidemia associated with type 2 diabetes mellitus (HCC) 01/15/2022   Extravasation of intravenous contrast medium 01/15/2022   Cocaine use disorder, mild, abuse (HCC) 06/15/2020   Bipolar I disorder, most recent episode depressed (HCC) 06/14/2020   Menopausal syndrome 05/30/2020   Bipolar disorder (HCC)     Prediabetes     Tooth infection 08/27/2018   Hemoptysis 08/27/2018   Blood in stool 07/23/2018   Chest pain of uncertain etiology 07/13/2012   Depression 07/13/2012   Tobacco abuse 07/13/2012   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   Rationale for Evaluation and Treatment: Rehabilitation   ONSET DATE: 09/2024   SUBJECTIVE:                                                                                                                                                                                            SUBJECTIVE STATEMENT: Patient reports she is doing ok.  Pain reported at 7/10     PERTINENT HISTORY:   Medications for current condition: gabapentin  Surgeries: Cesarean section x 3; Hernia repair Other: Diabetes; Bipolar Sexual abuse: No   PAIN:  01/26/25 Are you having pain? Yes NPRS scale: 7/10 Pain location: lower abdomen and sides of the abdomen and middle of back   Pain type: sharp and stabbing Pain description: intermittent    Aggravating factors: during movement Relieving factors: not moving   PRECAUTIONS: None   RED FLAGS: None       WEIGHT BEARING RESTRICTIONS: No   FALLS:  Has patient fallen in last 6 months? Yes 1 time due to leg giving out from numbness.    OCCUPATION: not working   ACTIVITY LEVEL : low level   PLOF: Independent   PATIENT GOALS: move with less pain, improve balance     BOWEL MOVEMENT: no issues     URINATION: Pain with urination: No Fully empty bladder: Yes:                                           Post-void dribble: No Stream: Strong Urgency: Yes  Frequency:during the day every 2-3 hours                                                        Nocturia: No   Leakage: Urge to void, Walking to the bathroom, Coughing, Sneezing, Laughing, Exercise, Lifting, and Bending forward Pads/briefs: Yes: 4-5   INTERCOURSE:             Ability to have vaginal penetration Yes  Pain with intercourse: none   PREGNANCY: C-section deliveries 3   PROLAPSE: Bulge     OBJECTIVE:  Note:  Objective measures were completed at Evaluation unless otherwise noted.     PATIENT SURVEYS:  PFIQ-7: 138 UIQ-7 67 POPIQ-7 71   COGNITION: Overall cognitive status: Within functional limits for tasks assessed                          SENSATION: Light touch: Deficits decreased on the lateral aspect of the right thigh     FUNCTIONAL TESTS:  5 times sit to stand: 29.13 sec Bed mobility:patient was not able to turn on the mat due to the increased pain in her abdomen and back, she had difficulty to sit up from supine due to increased pain.   01/26/25: TUG:  12.33 sec 6 MWT:  811.1 feet   GAIT: Assistive device utilized: none Comments: walks slowly with decreased step length    POSTURE: rounded shoulders, forward head, decreased lumbar lordosis, and posterior pelvic tilt     LUMBARAROM/PROM: lumbar ROM Decreased by 50%     LOWER EXTREMITY ROM:   Passive ROM Right eval Left eval  Hip flexion 90 90  Hip external rotation 40 40   (Blank rows = not tested)   LOWER EXTREMITY MMT:   MMT Right eval Left eval  Hip flexion 4/5 4/5  Hip extension 3/5 3/5  Hip abduction 3/5 3/5  Hip adduction 4/5 4/5  Knee flexion 4/5 4/5  Knee extension 4/5 4/5   (Blank rows = not tested) PALPATION:  Pelvic Alignment: ASIS are equal   Abdominal: tenderness located throughout the abdomen She has restrictions on the c-section scar                            External Perineal Exam: will be assessed in the future                             Internal Pelvic Floor: will be assessed in the future   Patient confirms identification and approves PT to assess internal pelvic floor and treatment not assessed today due to her coming 20 minutes late All internal or external pelvic floor assessments and/or treatments are completed with proper hand hygiene and gloves hands. If needed gloves are changed with hand hygiene during patient care time.   PELVIC MMT:   MMT eval  Vaginal    Internal Anal Sphincter    External Anal Sphincter    Puborectalis    (Blank rows = not tested)           TODAY'S TREATMENT:                                                                                                                              01/26/25 Nustep x 6 min level 3 (PT present to discuss status) Hook lying trunk rotation x 20 Hook lying march x 20 Hook lying isometric hip adduction  with purple ball x 20 holding 2-3 sec Hook lying isometric abdominal press into ball on knees x 20 holding 2-3 sec Bridging 2 x 10 Hooklying clam with red loop x 20 Instructed  in log rolling technique for supine to sit and sit to supine  DATE: 01/05/25  EVAL Examination completed, findings reviewed, pt educated on POC, HEP, and female pelvic floor anatomy, reasoning with pelvic floor assessment internally with pt consent. Pt motivated to participate in PT and agreeable to attempt recommendations.        PATIENT EDUCATION:  Education details: See above.  Person educated: Patient Education method: Explanation, Demonstration, Tactile cues, Verbal cues, and Handouts Education comprehension: verbalized understanding, returned demonstration, verbal cues required, tactile cues required, and needs further education   HOME EXERCISE PROGRAM: See above.    ASSESSMENT:   CLINICAL IMPRESSION: Susa was able to complete all tasks today wit minimal c/o pain.  She demonstrates fairly good control and strength with all exercises.  She demonstrates ability to do neutral pelvic position but fatigues quickly with trying to maintain this with dynamic core work.  She seems well motivated but has chronic pain nature.  She would benefit from continuing skilled PT to progress toward goals below.     OBJECTIVE IMPAIRMENTS: Abnormal gait, decreased activity tolerance, decreased coordination, decreased mobility, difficulty walking, decreased ROM, decreased strength, increased fascial restrictions, increased muscle spasms, postural dysfunction, and pain.    ACTIVITY LIMITATIONS: carrying, lifting, bending, sitting, standing, squatting, sleeping, stairs, transfers, bed mobility, continence, toileting, and locomotion level   PARTICIPATION LIMITATIONS: meal prep, cleaning, laundry, driving, shopping, and community activity   PERSONAL FACTORS: Fitness and 1-2 comorbidities: Cesarean section x 3; Hernia repair are also affecting patient's functional outcome.    REHAB POTENTIAL: Excellent   CLINICAL DECISION MAKING: Evolving/moderate complexity   EVALUATION COMPLEXITY: Moderate      GOALS: Goals reviewed with patient? Yes   SHORT TERM GOALS: Target date: 02/02/25   Patient has further assessment of the pelvic floor strength due to her urinary leakage.  Baseline:not done yet due to time constraints Goal status: INITIAL   2.  Patient will have further tests for her balance done including the TUG and 6 minute test.  Baseline: not done yet due to time constraints Goal status: INITIAL   3.  Patient independent with flexibility exercises to elongate her trunk and hips to reduce mobility restrictions Baseline: not educated yet Goal status: INITIAL   4.  Patient is able to perform diaphragmatic breathing to relax her diaphragm and abdominal pain.  Baseline: not able to perform.  Goal status: INITIAL   5.  Patient independent with initial balance exercises and lower extremity strength to reduce the risk of falls.  Baseline: not educated yet.  Goal status: INITIAL     LONG TERM GOALS: Target date: 07/05/25   Patient independent with advanced HEP for strength, balance and stretches to improve her balance, reduce her urinary leakage and pain.  Baseline:  Goal status: INITIAL   2.  Patient is able to perform the [facility] correctly to reduce her urinary leakage with coughing, sneezing, and laughing to minimal.  Baseline: leaks with coughing, sneezing, and laughing Goal status: INITIAL   3.  Patient is able to go from sit to stand without trying to steady herself due to improve balance and strength of legs.  Baseline: sit to stand ands waits 3 minutes due to not being steady on her feet Goal status: INITIAL   4.  Patient  is able to use the urge suppression drill to be able to walk to the bathroom with minimal to no urinary leakage.  Baseline: she leaks urine as she is walking to the bathroom Goal status: INITIAL   5.  Patient is able to move in bed and during the day with daily activities with pain level >/= 3-4/10 compared to 8/10.  Baseline: pain level 8/10.  Goal  status: INITIAL     PLAN:   PT FREQUENCY: 2x/week   PT DURATION: 6 months   PLANNED INTERVENTIONS: 97110-Therapeutic exercises, 97530- Therapeutic activity, 97112- Neuromuscular re-education, [phone]- Self Care, [id number]- Manual therapy, [phone]- Gait training, [phone]- Aquatic Therapy, [phone]- Electrical stimulation (unattended), [phone] (1-2 muscles), [id number] (3+ muscles)- Dry Needling, Patient/Family education, Joint mobilization, Spinal mobilization, Scar mobilization, Cryotherapy, Moist heat, and Biofeedback   PLAN FOR NEXT SESSION: ortho-progress core and proximal hip strengthening Pelvic- further assessment of the pelvic floor strength and muscle trigger points, diaphragmatic breathing, manual work to abdomen and c-section scar     Jennalynn Rivard B. Latise Dilley, PT 01/26/25 5:19 PM [facility] [address] [place], [place] [address] Phone # [phone] Fax [phone]

## 2025-02-01 ENCOUNTER — Ambulatory Visit: Payer: MEDICAID

## 2025-02-03 ENCOUNTER — Ambulatory Visit: Payer: MEDICAID

## 2025-02-07 ENCOUNTER — Ambulatory Visit: Payer: MEDICAID | Admitting: Physical Therapy

## 2025-02-10 ENCOUNTER — Ambulatory Visit: Payer: MEDICAID

## 2025-02-14 ENCOUNTER — Ambulatory Visit: Payer: MEDICAID | Admitting: Physical Therapy

## 2025-02-17 ENCOUNTER — Ambulatory Visit: Payer: MEDICAID

## 2025-02-21 ENCOUNTER — Ambulatory Visit: Payer: MEDICAID | Admitting: Physical Therapy

## 2025-02-21 ENCOUNTER — Other Ambulatory Visit: Payer: MEDICAID

## 2025-02-23 ENCOUNTER — Ambulatory Visit: Payer: MEDICAID | Admitting: Internal Medicine

## 2025-02-24 ENCOUNTER — Ambulatory Visit: Payer: MEDICAID

## 2025-03-16 ENCOUNTER — Telehealth (HOSPITAL_COMMUNITY): Payer: MEDICAID | Admitting: Psychiatry

## 2025-08-01 ENCOUNTER — Ambulatory Visit: Payer: MEDICAID | Admitting: Neurology

## 2025-08-22 ENCOUNTER — Encounter: Payer: MEDICAID | Admitting: Internal Medicine
